# Patient Record
Sex: Female | Born: 1965
Health system: Southern US, Community
[De-identification: ages and names within clinical notes are randomized; demographics above are authoritative.]

## PROBLEM LIST (undated history)

## (undated) DIAGNOSIS — J387 Other diseases of larynx: Secondary | ICD-10-CM

## (undated) DIAGNOSIS — G894 Chronic pain syndrome: Secondary | ICD-10-CM

## (undated) DIAGNOSIS — F32A Depression, unspecified: Secondary | ICD-10-CM

## (undated) DIAGNOSIS — M357 Hypermobility syndrome: Secondary | ICD-10-CM

## (undated) DIAGNOSIS — M1990 Unspecified osteoarthritis, unspecified site: Secondary | ICD-10-CM

## (undated) DIAGNOSIS — D472 Monoclonal gammopathy: Secondary | ICD-10-CM

## (undated) DIAGNOSIS — G5600 Carpal tunnel syndrome, unspecified upper limb: Secondary | ICD-10-CM

## (undated) DIAGNOSIS — N6012 Diffuse cystic mastopathy of left breast: Secondary | ICD-10-CM

## (undated) DIAGNOSIS — J45909 Unspecified asthma, uncomplicated: Secondary | ICD-10-CM

## (undated) DIAGNOSIS — Q796 Ehlers-Danlos syndrome, unspecified: Secondary | ICD-10-CM

## (undated) DIAGNOSIS — T783XXA Angioneurotic edema, initial encounter: Secondary | ICD-10-CM

## (undated) DIAGNOSIS — M533 Sacrococcygeal disorders, not elsewhere classified: Secondary | ICD-10-CM

## (undated) DIAGNOSIS — E785 Hyperlipidemia, unspecified: Secondary | ICD-10-CM

## (undated) DIAGNOSIS — N6011 Diffuse cystic mastopathy of right breast: Secondary | ICD-10-CM

## (undated) DIAGNOSIS — L658 Other specified nonscarring hair loss: Secondary | ICD-10-CM

## (undated) DIAGNOSIS — C801 Malignant (primary) neoplasm, unspecified: Secondary | ICD-10-CM

## (undated) DIAGNOSIS — Z803 Family history of malignant neoplasm of breast: Secondary | ICD-10-CM

## (undated) DIAGNOSIS — C4490 Unspecified malignant neoplasm of skin, unspecified: Secondary | ICD-10-CM

## (undated) DIAGNOSIS — D839 Common variable immunodeficiency, unspecified: Secondary | ICD-10-CM

## (undated) DIAGNOSIS — R05 Cough: Secondary | ICD-10-CM

## (undated) DIAGNOSIS — M5416 Radiculopathy, lumbar region: Secondary | ICD-10-CM

## (undated) DIAGNOSIS — F419 Anxiety disorder, unspecified: Secondary | ICD-10-CM

## (undated) DIAGNOSIS — M810 Age-related osteoporosis without current pathological fracture: Secondary | ICD-10-CM

## (undated) DIAGNOSIS — L308 Other specified dermatitis: Secondary | ICD-10-CM

## (undated) DIAGNOSIS — M797 Fibromyalgia: Secondary | ICD-10-CM

## (undated) DIAGNOSIS — N906 Unspecified hypertrophy of vulva: Secondary | ICD-10-CM

## (undated) DIAGNOSIS — M180 Bilateral primary osteoarthritis of first carpometacarpal joints: Secondary | ICD-10-CM

## (undated) DIAGNOSIS — M8580 Other specified disorders of bone density and structure, unspecified site: Secondary | ICD-10-CM

## (undated) DIAGNOSIS — T7840XA Allergy, unspecified, initial encounter: Secondary | ICD-10-CM

## (undated) HISTORY — DX: Malignant (primary) neoplasm, unspecified: C80.1

## (undated) HISTORY — DX: Diffuse cystic mastopathy of right breast: N60.11

## (undated) HISTORY — DX: Depression, unspecified: F32.A

## (undated) HISTORY — DX: Sacrococcygeal disorders, not elsewhere classified: M53.3

## (undated) HISTORY — PX: DENTAL SURGERY: SHX609

## (undated) HISTORY — DX: Ehlers-Danlos syndrome, unspecified: Q79.60

## (undated) HISTORY — DX: Unspecified osteoarthritis, unspecified site: M19.90

## (undated) HISTORY — DX: Unspecified malignant neoplasm of skin, unspecified: C44.90

## (undated) HISTORY — DX: Allergy, unspecified, initial encounter: T78.40XA

## (undated) HISTORY — PX: SPINE SURGERY: SHX786

## (undated) HISTORY — DX: Family history of malignant neoplasm of breast: Z80.3

## (undated) HISTORY — DX: Cough: R05

## (undated) HISTORY — PX: JOINT REPLACEMENT: SHX530

## (undated) HISTORY — DX: Diffuse cystic mastopathy of left breast: N60.12

## (undated) HISTORY — DX: Hyperlipidemia, unspecified: E78.5

## (undated) HISTORY — DX: Angioneurotic edema, initial encounter: T78.3XXA

## (undated) HISTORY — DX: Anxiety disorder, unspecified: F41.9

## (undated) HISTORY — DX: Other specified disorders of bone density and structure, unspecified site: M85.80

## (undated) HISTORY — DX: Radiculopathy, lumbar region: M54.16

## (undated) HISTORY — PX: ELBOW FRACTURE SURGERY: SHX616

## (undated) HISTORY — DX: Other specified dermatitis: L30.8

## (undated) HISTORY — PX: EYE SURGERY: SHX253

## (undated) HISTORY — DX: Bilateral primary osteoarthritis of first carpometacarpal joints: M18.0

## (undated) HISTORY — DX: Monoclonal gammopathy: D47.2

## (undated) HISTORY — DX: Age-related osteoporosis without current pathological fracture: M81.0

## (undated) HISTORY — DX: Carpal tunnel syndrome, unspecified upper limb: G56.00

## (undated) HISTORY — DX: Other specified nonscarring hair loss: L65.8

## (undated) HISTORY — PX: BREAST SURGERY: SHX581

## (undated) HISTORY — PX: COLONOSCOPY: SHX174

## (undated) HISTORY — DX: Common variable immunodeficiency, unspecified: D83.9

## (undated) HISTORY — PX: FRACTURE SURGERY: SHX138

## (undated) HISTORY — DX: Other diseases of larynx: J38.7

## (undated) HISTORY — DX: Fibromyalgia: M79.7

## (undated) HISTORY — DX: Hypermobility syndrome: M35.7

## (undated) HISTORY — DX: Unspecified hypertrophy of vulva: N90.60

## (undated) HISTORY — DX: Chronic pain syndrome: G89.4

---

## 1998-04-11 DIAGNOSIS — K582 Mixed irritable bowel syndrome: Secondary | ICD-10-CM | POA: Insufficient documentation

## 2006-12-01 DIAGNOSIS — F4323 Adjustment disorder with mixed anxiety and depressed mood: Secondary | ICD-10-CM | POA: Insufficient documentation

## 2007-05-01 DIAGNOSIS — F4312 Post-traumatic stress disorder, chronic: Secondary | ICD-10-CM | POA: Insufficient documentation

## 2009-10-09 ENCOUNTER — Encounter: Admission: RE | Admit: 2009-10-09 | Discharge: 2009-10-09 | Payer: Self-pay | Admitting: Obstetrics and Gynecology

## 2011-04-14 DIAGNOSIS — L658 Other specified nonscarring hair loss: Secondary | ICD-10-CM | POA: Insufficient documentation

## 2011-04-14 DIAGNOSIS — L308 Other specified dermatitis: Secondary | ICD-10-CM | POA: Insufficient documentation

## 2011-04-14 DIAGNOSIS — L989 Disorder of the skin and subcutaneous tissue, unspecified: Secondary | ICD-10-CM

## 2011-04-14 HISTORY — DX: Disorder of the skin and subcutaneous tissue, unspecified: L98.9

## 2011-04-14 HISTORY — DX: Other specified nonscarring hair loss: L65.8

## 2014-04-17 ENCOUNTER — Emergency Department (HOSPITAL_COMMUNITY)
Admission: EM | Admit: 2014-04-17 | Discharge: 2014-04-17 | Disposition: A | Discharge status: Home / Self Care | Payer: 59 | Source: Home / Self Care | Attending: Emergency Medicine | Admitting: Emergency Medicine

## 2014-04-17 ENCOUNTER — Encounter (HOSPITAL_COMMUNITY): Payer: Self-pay | Admitting: *Deleted

## 2014-04-17 DIAGNOSIS — M545 Low back pain, unspecified: Secondary | ICD-10-CM

## 2014-04-17 HISTORY — DX: Unspecified asthma, uncomplicated: J45.909

## 2014-04-17 MED ORDER — KETOROLAC TROMETHAMINE 60 MG/2ML IM SOLN
INTRAMUSCULAR | Status: AC
Start: 2014-04-17 — End: 2014-04-17
  Filled 2014-04-17: qty 2

## 2014-04-17 MED ORDER — GABAPENTIN 300 MG PO CAPS: 300.0000 mg | ORAL_CAPSULE | Freq: Every day | ORAL | Status: DC

## 2014-04-17 MED ORDER — KETOROLAC TROMETHAMINE 60 MG/2ML IM SOLN
60.0000 mg | Freq: Once | INTRAMUSCULAR | Status: AC
  Administered 2014-04-17: 60 mg via INTRAMUSCULAR

## 2014-04-17 MED ORDER — PREDNISONE 20 MG PO TABS: 40.0000 mg | ORAL_TABLET | Freq: Every day | ORAL | Status: DC

## 2014-04-17 MED ORDER — CYCLOBENZAPRINE HCL 10 MG PO TABS: 10.0000 mg | ORAL_TABLET | Freq: Three times a day (TID) | ORAL | Status: DC | PRN

## 2014-04-17 NOTE — ED Notes (Signed)
Pt  Reports   Back  Pain        X  sev  Months        No  Recent  injury

## 2014-04-17 NOTE — Discharge Instructions (Signed)
Take prednisone 40mg  daily for 5 days. Take flexeril 3 times a day as needed for muscle spasm. Continue the ibuprofen - you can take it up to ever 6-8 hours. Continue gabapentin at bedtime as needed.  Once the pain is back to a manageable level, get back on your exercising schedule. Seeing a chiropractor (there is one [place] of here on [address]) or a massage therapist would likely be beneficial. Follow up as needed.

## 2014-04-17 NOTE — ED Provider Notes (Signed)
CSN: [id number]     Arrival date & time 04/17/14  1227 History   First MD Initiated Contact with Patient 04/17/14 1338     Chief Complaint  Patient presents with  . Back Pain   (Consider location/radiation/quality/duration/timing/severity/associated sxs/prior Treatment) HPI  She is a 49 year old woman here for evaluation of low back pain. She states this is a chronic issue for the last 20 years. Typically, it is manageable with ibuprofen in the morning, Tylenol and the evening, and an exercise and stretching regimen. Her pain has gotten worse in the last 2 months. She states she has changed jobs and gone back to school which have interrupted her normal exercise and stretching routine. The pain is located across her lower back, worse on the right side. She does occasionally have some pain radiating down into her legs. She denies any numbness, tingling, weakness. No bowel or bladder incontinence. No saddle anesthesia. She does have gabapentin available, and the pain has gone to the point where she has been using it the last few days. She would like to avoid all narcotics.  She has a history of asthma for which she uses an albuterol inhaler as needed.  Past Medical History  Diagnosis Date  . Asthma    History reviewed. No pertinent past surgical history. History reviewed. No pertinent family history. History  Substance Use Topics  . Smoking status: Never Smoker   . Smokeless tobacco: Not on file  . Alcohol Use: No   OB History    No data available     Review of Systems  Musculoskeletal: Positive for back pain.  Neurological: Negative for weakness and numbness.    Allergies  Penicillins and Sulfa antibiotics  Home Medications   Prior to Admission medications   Medication Sig Start Date End Date Taking? Authorizing Provider  Acetaminophen (TYLENOL ARTHRITIS PAIN PO) Take by mouth.   Yes Historical Provider, MD  Albuterol Sulfate 108 (90 BASE) MCG/ACT AEPB Inhale into the lungs.    Yes Historical Provider, MD  fluticasone (FLONASE) 50 MCG/ACT nasal spray Place 2 sprays into both nostrils daily.   Yes Historical Provider, MD  gabapentin (NEURONTIN) 300 MG capsule Take 300 mg by mouth 3 (three) times daily.   Yes Historical Provider, MD  hyoscyamine (LEVBID) 0.375 MG 12 hr tablet Take 0.375 mg by mouth 2 (two) times daily.   Yes Historical Provider, MD  ibuprofen (ADVIL,MOTRIN) 800 MG tablet Take 800 mg by mouth every 8 (eight) hours as needed.   Yes Historical Provider, MD  montelukast (SINGULAIR) 10 MG tablet Take 10 mg by mouth at bedtime.   Yes Historical Provider, MD  cyclobenzaprine (FLEXERIL) 10 MG tablet Take 1 tablet (10 mg total) by mouth 3 (three) times daily as needed for muscle spasms. 04/17/14   Melony Overly, MD  gabapentin (NEURONTIN) 300 MG capsule Take 1 capsule (300 mg total) by mouth at bedtime. 04/17/14   Melony Overly, MD  predniSONE (DELTASONE) 20 MG tablet Take 2 tablets (40 mg total) by mouth daily. 04/17/14   Melony Overly, MD   BP 122/84 mmHg  Pulse 60  Temp(Src) 98.3 F (36.8 C) (Oral)  Resp 14  SpO2 100% Physical Exam  Constitutional: She is oriented to person, place, and time. She appears well-developed and well-nourished. She appears distressed (uncomfortable).  Cardiovascular: Normal rate.   Pulmonary/Chest: Effort normal.  Musculoskeletal:  Back: No vertebral step-offs.  No erythema or edema.  Diffusely tender across lower back.  Muscle spasm R >  L.  Negative SLR.  5/5 strength in bilateral lower extremities.  Neurological: She is alert and oriented to person, place, and time.    ED Course  Procedures (including critical care time) Labs Review Labs Reviewed - No data to display  Imaging Review No results found.   MDM   1. Bilateral low back pain without sciatica    Toradol 60 mg IM given. We'll treat with a five-day course of prednisone and add Flexeril 3 times a day when necessary. I refilled her gabapentin. Recommended resuming  her exercise and stretching regimen as soon as possible. Also discussed that chiropractor or massage would likely be beneficial. Follow-up as needed.    Melony Overly, MD 04/17/14 [phone]

## 2014-04-19 ENCOUNTER — Emergency Department (HOSPITAL_COMMUNITY)
Admission: EM | Admit: 2014-04-19 | Discharge: 2014-04-19 | Disposition: A | Discharge status: Home / Self Care | Payer: 59 | Source: Home / Self Care | Attending: Emergency Medicine | Admitting: Emergency Medicine

## 2014-04-19 ENCOUNTER — Encounter (HOSPITAL_COMMUNITY): Payer: Self-pay | Admitting: Emergency Medicine

## 2014-04-19 DIAGNOSIS — M5431 Sciatica, right side: Secondary | ICD-10-CM

## 2014-04-19 MED ORDER — TRAMADOL HCL 50 MG PO TABS: 100.0000 mg | ORAL_TABLET | Freq: Three times a day (TID) | ORAL | Status: DC | PRN

## 2014-04-19 MED ORDER — KETOROLAC TROMETHAMINE 60 MG/2ML IM SOLN
INTRAMUSCULAR | Status: AC
  Filled 2014-04-19: qty 2

## 2014-04-19 MED ORDER — KETOROLAC TROMETHAMINE 60 MG/2ML IM SOLN
60.0000 mg | Freq: Once | INTRAMUSCULAR | Status: AC
  Administered 2014-04-19: 60 mg via INTRAMUSCULAR

## 2014-04-19 MED ORDER — TIZANIDINE HCL 4 MG PO CAPS: 4.0000 mg | ORAL_CAPSULE | Freq: Three times a day (TID) | ORAL | Status: DC

## 2014-04-19 NOTE — Discharge Instructions (Signed)
Do exercises twice daily followed by moist heat for 15 minutes. ° ° ° ° ° °Try to be as active as possible. ° °If no better in 2 weeks, follow up with orthopedist. ° ° °
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
## 2014-04-22 ENCOUNTER — Emergency Department (HOSPITAL_COMMUNITY)
Admission: EM | Admit: 2014-04-22 | Discharge: 2014-04-22 | Disposition: A | Discharge status: Home / Self Care | Payer: 59 | Source: Home / Self Care

## 2014-05-06 ENCOUNTER — Other Ambulatory Visit (HOSPITAL_COMMUNITY): Payer: Self-pay | Admitting: Neurosurgery

## 2014-05-06 DIAGNOSIS — M545 Low back pain: Secondary | ICD-10-CM

## 2014-05-08 ENCOUNTER — Ambulatory Visit (INDEPENDENT_AMBULATORY_CARE_PROVIDER_SITE_OTHER): Payer: 59 | Admitting: Family Medicine

## 2014-05-08 VITALS — BP 110/78 | HR 65 | Temp 97.9°F | Resp 16 | Ht 66.0 in | Wt 135.0 lb

## 2014-05-08 DIAGNOSIS — R059 Cough, unspecified: Secondary | ICD-10-CM

## 2014-05-08 DIAGNOSIS — R05 Cough: Secondary | ICD-10-CM

## 2014-05-08 DIAGNOSIS — J069 Acute upper respiratory infection, unspecified: Secondary | ICD-10-CM

## 2014-05-08 MED ORDER — AZITHROMYCIN 250 MG PO TABS: ORAL_TABLET | ORAL | Status: DC

## 2014-05-08 NOTE — Patient Instructions (Signed)
You likely have a viral URI. Continue to use OTC medications as needed.   However if you are not getting better in the next few days you can fill and use the azithromycin rx.   Let me know if you have any questions or concerns!

## 2014-05-08 NOTE — Progress Notes (Signed)
[facility] [address] [place] [address] [phone]  Date:  05/08/2014   Name:  Lindsey [name]   DOB:  Oct 29, 1965   MRN:  [id number]  PCP:  No primary care provider on file.    Chief Complaint: Cough and Chest Congestion   History of Present Illness:  Lindsey [name] is a 49 y.o. very pleasant female patient who presents with the following:  Here today as a new patient with illness.  She has noted "cold sx" such as cough, congestion, runny and stuffy nose, a little bit of sinus pressure.  These sx have been present for about one week.  She is using some delsym OTC.  She wants to make sure she does not have bacterial bronchitis but does not want to have abx unless necessary  She has not noted a fever, but she has had chills.  She has a history of asthma, but has not really noted this worsening since she has been ill.  She does have a history of back trouble, was in the ER aabout 2 weeks ago and treated iwht zanaflex, tramadol, prednisone.   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   Past Medical History  Diagnosis Date  . Asthma     History reviewed. No pertinent past surgical history.  History  Substance Use Topics  . Smoking status: Never Smoker   . Smokeless tobacco: Not on file  . Alcohol Use: No    History reviewed. No pertinent family history.  Allergies  Allergen Reactions  . Penicillins   . Sulfa Antibiotics     Medication list has been reviewed and updated.  Current Outpatient Prescriptions on File Prior to Visit  Medication Sig Dispense Refill  . Acetaminophen (TYLENOL ARTHRITIS PAIN PO) Take by mouth.    . Albuterol Sulfate 108 (90 BASE) MCG/ACT AEPB Inhale into the lungs.    . fluticasone (FLONASE) 50 MCG/ACT nasal spray Place 2 sprays into both nostrils daily.    Marland Kitchen gabapentin (NEURONTIN) 300 MG capsule Take 1 capsule  (300 mg total) by mouth at bedtime. 30 capsule 3  . hyoscyamine (LEVBID) 0.375 MG 12 hr tablet Take 0.375 mg by mouth 2 (two) times daily.    Marland Kitchen ibuprofen (ADVIL,MOTRIN) 800 MG tablet Take 800 mg by mouth every 8 (eight) hours as needed.    . montelukast (SINGULAIR) 10 MG tablet Take 10 mg by mouth at bedtime.    . cyclobenzaprine (FLEXERIL) 10 MG tablet Take 1 tablet (10 mg total) by mouth 3 (three) times daily as needed for muscle spasms. (Patient not taking: Reported on 05/08/2014) 30 tablet 0  . gabapentin (NEURONTIN) 300 MG capsule Take 300 mg by mouth 3 (three) times daily.    . predniSONE (DELTASONE) 20 MG tablet Take 2 tablets (40 mg total) by mouth daily. (Patient not taking: Reported on 05/08/2014) 10 tablet 0  . tiZANidine (ZANAFLEX) 4 MG capsule Take 1 capsule (4 mg total) by mouth 3 (three) times daily. (Patient not taking: Reported on 05/08/2014) 30 capsule 0  . traMADol (ULTRAM) 50 MG tablet Take 2 tablets (100 mg total) by mouth every 8 (eight) hours as needed. (Patient not taking: Reported on 05/08/2014) 30 tablet 0   No current facility-administered medications on file prior to visit.    Review of Systems:  As per HPI-  otherwise negative.   Physical Examination: Filed Vitals:   05/08/14 1713  BP: 110/78  Pulse: 98  Temp: 97.9 F (36.6 C)  Resp: 16   Filed Vitals:   05/08/14 1713  Height: 5\' 6"  (1.676 m)  Weight: 135 lb (61.236 kg)   Body mass index is 21.8 kg/(m^2). Ideal Body Weight: Weight in (lb) to have BMI = 25: 154.6  GEN: WDWN, NAD, Non-toxic, A & O x 3, looks well HEENT: Atraumatic, Normocephalic. Neck supple. No masses, No LAD.  Bilateral TM wnl, oropharynx normal.  PEERL,EOMI.   Ears and Nose: No external deformity. CV: RRR, No M/G/R. No JVD. No thrill. No extra heart sounds. PULM: CTA B, no wheezes, crackles, rhonchi. No retractions. No resp. distress. No accessory muscle use. EXTR: No c/c/e NEURO Normal gait.  PSYCH: Normally interactive. Conversant.  Not depressed or anxious appearing.  Calm demeanor.    Assessment and Plan: Viral URI - Plan: azithromycin (ZITHROMAX) 250 MG tablet  Cough - Plan: azithromycin (ZITHROMAX) 250 MG tablet  Benign exam.  Reassured that she likely has a viral URI.  She is ok with this, but will take a zpack to hang on to and use if not better in the next few days.  She will let me know if she is worse or if she has any other concerns   Signed Lamar Blinks, MD

## 2014-05-12 ENCOUNTER — Encounter (HOSPITAL_COMMUNITY): Payer: Self-pay | Admitting: *Deleted

## 2014-05-12 ENCOUNTER — Emergency Department (HOSPITAL_COMMUNITY)
Admission: EM | Admit: 2014-05-12 | Discharge: 2014-05-12 | Disposition: A | Discharge status: Home / Self Care | Payer: 59 | Source: Home / Self Care | Attending: Family Medicine | Admitting: Family Medicine

## 2014-05-12 DIAGNOSIS — M5136 Other intervertebral disc degeneration, lumbar region: Secondary | ICD-10-CM

## 2014-05-12 NOTE — ED Notes (Signed)
Per  Dr Juventino Slovak    Change  Work  Note  To  Return  2/2 /16   On light  Duty  Till  Released  By  Dr Arnoldo Morale

## 2014-05-12 NOTE — ED Notes (Signed)
Dr  Juventino Slovak    Wants  Pt  To  Have  A  Note  Releasing her to  Light  Duty 05/13/14

## 2014-05-12 NOTE — Discharge Instructions (Signed)
See dr Arnoldo Morale and get mri as planned.

## 2014-05-12 NOTE — ED Notes (Signed)
Dr  Juventino Slovak  Back  In  To  See  Patient      And  Discussed   Plan of  Care  With  Pt

## 2014-05-12 NOTE — ED Notes (Signed)
Pt  Wants  To  Be  evaulted       To  Go  Back  To  Work          Pt  Still  Reports   Still   Has  Back  Pain         -     Pt   Has  An  MRI    SCHEDULED   FEB  9          SHE  IS  SITTING  UPRIGHT  ON  EXAM TABL;E   IN NO  SEVERE    DISTRESS

## 2014-05-12 NOTE — ED Provider Notes (Signed)
CSN: [id number]     Arrival date & time 05/12/14  1430 History   First MD Initiated Contact with Patient 05/12/14 [phone]     Chief Complaint  Patient presents with  . Follow-up   (Consider location/radiation/quality/duration/timing/severity/associated sxs/prior Treatment) Patient is a 49 y.o. female presenting with back pain. The history is provided by the patient.  Back Pain Location:  Lumbar spine Quality:  Shooting Radiates to:  R foot and R thigh Pain severity:  Mild Onset quality:  Gradual Duration:  6 months Progression:  Waxing and waning Chronicity:  Chronic Ineffective treatments:  Muscle relaxants and NSAIDs (chiropracter care.) Associated symptoms: leg pain and numbness   Associated symptoms: no bladder incontinence and no bowel incontinence   Risk factors comment:  Chronic problem, not responding to current therapy, wants to return to work and f/u with dr Arnoldo Morale as planned.   Past Medical History  Diagnosis Date  . Asthma    History reviewed. No pertinent past surgical history. History reviewed. No pertinent family history. History  Substance Use Topics  . Smoking status: Never Smoker   . Smokeless tobacco: Not on file  . Alcohol Use: No   OB History    No data available     Review of Systems  Gastrointestinal: Negative.  Negative for bowel incontinence.  Genitourinary: Negative.  Negative for bladder incontinence.  Musculoskeletal: Positive for back pain.  Skin: Negative.   Neurological: Positive for numbness.    Allergies  Penicillins and Sulfa antibiotics  Home Medications   Prior to Admission medications   Medication Sig Start Date End Date Taking? Authorizing Provider  Acetaminophen (TYLENOL ARTHRITIS PAIN PO) Take by mouth.    Historical Provider, MD  Albuterol Sulfate 108 (90 BASE) MCG/ACT AEPB Inhale into the lungs.    Historical Provider, MD  azithromycin (ZITHROMAX) 250 MG tablet Use as a zpack 05/08/14   Gay Filler Copland, MD   cyclobenzaprine (FLEXERIL) 10 MG tablet Take 1 tablet (10 mg total) by mouth 3 (three) times daily as needed for muscle spasms. Patient not taking: Reported on 05/08/2014 04/17/14   Melony Overly, MD  fluticasone [facility]) 50 MCG/ACT nasal spray Place 2 sprays into both nostrils daily.    Historical Provider, MD  gabapentin (NEURONTIN) 300 MG capsule Take 1 capsule (300 mg total) by mouth at bedtime. 04/17/14   Melony Overly, MD  gabapentin (NEURONTIN) 300 MG capsule Take 300 mg by mouth 3 (three) times daily.    Historical Provider, MD  hyoscyamine (LEVBID) 0.375 MG 12 hr tablet Take 0.375 mg by mouth 2 (two) times daily.    Historical Provider, MD  ibuprofen (ADVIL,MOTRIN) 800 MG tablet Take 800 mg by mouth every 8 (eight) hours as needed.    Historical Provider, MD  montelukast (SINGULAIR) 10 MG tablet Take 10 mg by mouth at bedtime.    Historical Provider, MD  traMADol (ULTRAM) 50 MG tablet Take 2 tablets (100 mg total) by mouth every 8 (eight) hours as needed. Patient not taking: Reported on 05/08/2014 04/19/14   Harden Mo, MD   BP 115/79 mmHg  Pulse 80  Temp(Src) 98 F (36.7 C) (Oral)  Resp 16  SpO2 100%  LMP 04/11/2014 Physical Exam  Constitutional: She is oriented to person, place, and time. She appears well-developed and well-nourished. No distress.  Neck: Normal range of motion. Neck supple.  Musculoskeletal: She exhibits tenderness.  Neurological: She is alert and oriented to person, place, and time.  Skin: Skin is warm and  dry.  Nursing note and vitals reviewed.   ED Course  Procedures (including critical care time) Labs Review Labs Reviewed - No data to display  Imaging Review No results found.   MDM   1. Degenerative lumbar disc        Billy Fischer, MD 05/12/14 1626

## 2014-05-16 ENCOUNTER — Ambulatory Visit (INDEPENDENT_AMBULATORY_CARE_PROVIDER_SITE_OTHER): Payer: 59 | Admitting: Physician Assistant

## 2014-05-16 VITALS — BP 110/70 | HR 60 | Temp 97.4°F | Resp 16 | Ht 66.0 in | Wt 139.0 lb

## 2014-05-16 DIAGNOSIS — M545 Low back pain: Secondary | ICD-10-CM

## 2014-05-16 NOTE — Progress Notes (Signed)
[facility] [address] [place] [address] [phone]  Date:  05/16/2014   Name:  Lindsey [name]   DOB:  [birth year]   MRN:  [id number]  PCP:  No primary care provider on file.    Chief Complaint: Back Pain   History of Present Illness:  Lindsey [name] is a 49 y.o. very pleasant female patient who presents with the following:  She has back pain that has been present for about 20 years, but has progressively worsened over the last month.  She states that the back pain progressed to muscle spasms.  She was referred to neurosurgeon, Dr. Arnoldo Morale.  She has an MRI scheduled for 05/20/2014.  In the meantime, she has had therapy with Dr. Jimmye Norman of [place].  She states that this worsened her pain.  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  XR displayed DDD.  He referred her to neurosurgery and he advised that she not work.  She would like to resume her work however, but with restrictons.  She is able to do exercises and stretches at home.  The numbness and tingling of her lower extremities has resolved, however she is unable to stay placed in one postural position for longer than 10 minutes.  She was able do a 2 mile run without complication.  She denies fever, incontinence, numbness and tingling, or weakness.    Past Medical History  Diagnosis Date  . Asthma     History reviewed. No pertinent past surgical history.  History  Substance Use Topics  . Smoking status: Never Smoker   . Smokeless tobacco: Not on file  . Alcohol Use: No    Family History  Problem Relation Age of Onset  . Hypertension Mother   . Stroke Father   . Hypertension Father     Allergies  Allergen Reactions  . Penicillins   . Sulfa Antibiotics     Medication list has been reviewed and updated.  Current Outpatient Prescriptions on File Prior to Visit  Medication Sig Dispense Refill  . Acetaminophen (TYLENOL ARTHRITIS PAIN PO) Take by mouth.    .  Albuterol Sulfate 108 (90 BASE) MCG/ACT AEPB Inhale into the lungs.    . fluticasone (FLONASE) 50 MCG/ACT nasal spray Place 2 sprays into both nostrils daily.    Marland Kitchen gabapentin (NEURONTIN) 300 MG capsule Take 300 mg by mouth 3 (three) times daily.    . hyoscyamine (LEVBID) 0.375 MG 12 hr tablet Take 0.375 mg by mouth 2 (two) times daily.    Marland Kitchen ibuprofen (ADVIL,MOTRIN) 800 MG tablet Take 800 mg by mouth every 8 (eight) hours as needed.    . montelukast (SINGULAIR) 10 MG tablet Take 10 mg by mouth at bedtime.    . cyclobenzaprine (FLEXERIL) 10 MG tablet Take 1 tablet (10 mg total) by mouth 3 (three) times daily as needed for muscle spasms. (Patient not taking: Reported on 05/08/2014) 30 tablet 0  . gabapentin (NEURONTIN) 300 MG capsule Take 1 capsule (300 mg total) by mouth at bedtime. (Patient not taking: Reported on 05/16/2014) 30 capsule 3  . traMADol (ULTRAM) 50 MG tablet Take 2 tablets (100 mg total) by mouth every 8 (eight) hours as needed. (Patient not taking: Reported on 05/08/2014) 30 tablet 0   No current facility-administered medications on file prior to visit.    Review of Systems: ROS otherwise unremarkable unless listed above.    Physical Examination: Filed Vitals:   05/16/14 1136  BP: 110/70  Pulse: 60  Temp: 97.4 F (36.3 C)  Resp: 16   Filed Vitals:   05/16/14 1136  Height: 5\' 6"  (1.676 m)  Weight: 139 lb (63.05 kg)   Body mass index is 22.45 kg/(m^2). Ideal Body Weight: Weight in (lb) to have BMI = 25: 154.6  Constitutional: She is oriented to person, place, and time. She appears well-developed and well-nourished. No distress.  HENT:  Head: Normocephalic.  Cardiovascular: Normal rate.   Musculoskeletal: Normal range of motion.  Back: No bruising, erythema, or swelling.  No muscular spasm appreciated though tender at the right lower lumbar with palpation.  Normal forward flexion, however with some pain.  Normal lateral deviation.  Normal gait, normal tip toe gait, normal  heel walk.  Normal tandem movement.    Neurological: She is alert and oriented to person, place, and time. She has normal strength. She displays no atrophy. No cranial nerve deficit. She exhibits normal muscle tone. She displays a negative Romberg sign. Coordination and gait normal.  Reflex Scores:      Patellar reflexes are 2+ on the right side and 2+ on the left side. Skin: Skin is warm and dry.  Psychiatric: She has a normal mood and affect. Her behavior is normal.   Assessment and Plan: 49 year old female is here today for chief complaint of back pain.  Patient appears very able bodied, however after integrative therapy, patient is not improving per report.  I will place her on temporary work restrictions.  Patient is requesting every other day schedule, which I will not do at this time.    Low back pain, unspecified back pain laterality, with sciatica presence unspecified -stable, no changes to medicaiton -placed temporary work restriction in 2 weeks-- Restrictions include not lifting greater than 10 lbs, no pulling greater than 30 and 50 lbs, respectively.  4 hour work shifts per day, and 10 minute position changes.   -Because XRay was done with chiropractor, and MRI in several days, will not reimage at this time, but await results.   Ivar Drape, PA-C [facility] 2/6/20168:02 PM

## 2014-05-20 ENCOUNTER — Ambulatory Visit (HOSPITAL_COMMUNITY)
Admission: RE | Admit: 2014-05-20 | Discharge: 2014-05-20 | Disposition: A | Discharge status: Home / Self Care | Payer: 59 | Source: Ambulatory Visit | Attending: Neurosurgery | Admitting: Neurosurgery

## 2014-05-20 DIAGNOSIS — K769 Liver disease, unspecified: Secondary | ICD-10-CM | POA: Insufficient documentation

## 2014-05-20 DIAGNOSIS — M545 Low back pain: Secondary | ICD-10-CM

## 2014-05-20 DIAGNOSIS — M47896 Other spondylosis, lumbar region: Secondary | ICD-10-CM | POA: Diagnosis not present

## 2014-05-23 ENCOUNTER — Ambulatory Visit (INDEPENDENT_AMBULATORY_CARE_PROVIDER_SITE_OTHER): Payer: 59 | Admitting: Internal Medicine

## 2014-05-23 VITALS — BP 144/92 | HR 82 | Temp 97.8°F | Resp 16 | Ht 66.0 in | Wt 136.0 lb

## 2014-05-23 DIAGNOSIS — M545 Low back pain, unspecified: Secondary | ICD-10-CM

## 2014-05-23 NOTE — Patient Instructions (Signed)
You may return to work as directed.  follow up with Dr Arnoldo Morale for re evaluation of your back pain and to followup with the MRI of the liver Any problems return to the office.

## 2014-05-23 NOTE — Progress Notes (Signed)
   Subjective:    Patient ID: Lindsey [name], female    DOB: 13-Jan-1966, 49 y.o.   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 Arnoldo Morale with an mri done on 2/9.   She has a follow up with te neurosurgeon and also has a mri o f the liver which is to be schedule d by the neurosurgeon because of 2 lesion that were noted  on the mri of the lumbar spine and needs to be evaluated further.   The mri result showed minimal lumbar spondylosis and facet arthrosis without stenosis. She continues t o have some chronic pain of the right para lumbar region but does not want to take any pain meds or muscle relaxants and will follow up for this with Dr. Arnoldo Morale.    Review of Systems  Constitutional: Negative.   HENT: Negative.   Eyes: Negative.   Respiratory: Negative.   Cardiovascular: Negative.   Gastrointestinal: Negative.   Endocrine: Negative.   Genitourinary: Negative.   Musculoskeletal: Positive for back pain.  Skin: Negative.   Allergic/Immunologic: Negative.   Neurological: Negative.   Hematological: Negative.   Psychiatric/Behavioral: Negative.   All other systems reviewed and are negative.      Objective:   Physical Exam  Constitutional: She is oriented to person, place, and time. She appears well-developed and well-nourished.  Blood pressure 144/92, pulse 82, temperature 97.8 F (36.6 C), temperature source Oral, resp. rate 16, height 5\' 6"  (1.676 m), weight 136 lb (61.689 kg), last menstrual period 04/11/2014, SpO2 100 %.  HENT:  Head: Normocephalic and atraumatic.  Mouth/Throat: Oropharynx is clear and moist.    Eyes: Conjunctivae and EOM are normal. Pupils are equal, round, and reactive to light.  Neck: Normal range of motion. Neck supple.  Cardiovascular: Normal rate, regular rhythm, normal heart sounds and intact distal pulses.   Pulmonary/Chest: Effort normal and breath sounds normal.  Abdominal: Soft.  Musculoskeletal: She exhibits tenderness.  Tender para lumbar spasm of the lower back on the right side  Neurological: She is alert and oriented to person, place, and time. She has normal reflexes.  Skin: Skin is warm and dry.  Psychiatric: She has a normal mood and affect. Her behavior is normal. Judgment and thought content normal.  Nursing note and vitals reviewed.         Assessment & Plan:

## 2014-05-29 ENCOUNTER — Other Ambulatory Visit (HOSPITAL_COMMUNITY): Payer: Self-pay | Admitting: Neurosurgery

## 2014-05-29 DIAGNOSIS — K769 Liver disease, unspecified: Secondary | ICD-10-CM

## 2014-06-03 ENCOUNTER — Ambulatory Visit (INDEPENDENT_AMBULATORY_CARE_PROVIDER_SITE_OTHER): Payer: 59 | Admitting: Physician Assistant

## 2014-06-03 VITALS — BP 130/72 | HR 75 | Temp 97.4°F | Resp 16 | Ht 66.0 in | Wt 134.4 lb

## 2014-06-03 DIAGNOSIS — M5136 Other intervertebral disc degeneration, lumbar region: Secondary | ICD-10-CM

## 2014-06-03 DIAGNOSIS — M545 Low back pain, unspecified: Secondary | ICD-10-CM

## 2014-06-03 DIAGNOSIS — M5126 Other intervertebral disc displacement, lumbar region: Secondary | ICD-10-CM

## 2014-06-03 NOTE — Progress Notes (Signed)
Subjective:    Patient ID: Lindsey [name], female    DOB: 1966-03-22, 49 y.o.   MRN: [id number]  HPI  Pt presents to clinic to have a letter written for her work.  She has many letters but none of them are exactly what needs to be said for the company, [employer], that deals with missed work or restrictions at her job.  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  Her manager has been great.  Her normal shifts are 8 hours and consist of walking, sitting and standing.  She has been seen here 2 times as well as multiple times at [facility].  Her PCP will not write her a note and neither will Dr Arnoldo Morale who has ordered and evaluated her MRI and ordered PT for her.  She has no f/u plans with Dr Arnoldo Morale because per the patient he does not want to see her anymore because she is not a surgical case.  She has multiple notes that have been written that are almost correct but the [place] has told her what is wrong with all of them so she thinks tonight we can write her a letter they will accept for her restricted duties while doing her normal shift.    Tonight her pain has not changed from her last visit.  She is taking the same medication (zanaflex and gabapentin) as the last visit.  The only thing that has changed is that she now has an appt with PT next week.  She has tried to stay active (even riding a horse today) but she states that her normal duties at work cause her increase pain.  Chiropractor - 1/12 until 2/1 - they did laser and [place] - she was having more pain  MRI 2/9 - bulging disc but no herniation - Dr Arnoldo Morale - said PT Referral for PT ([facility]) 2/29 evaluation  Review of Systems     Objective:   Physical Exam  Constitutional: She is oriented to person, place, and time. She appears well-developed and well-nourished.  BP 130/72 mmHg  Pulse 75  Temp(Src) 97.4 F (36.3 C)  (Oral)  Resp 16  Ht 5\' 6"  (1.676 m)  Wt 134 lb 6 oz (60.952 kg)  BMI 21.70 kg/m2  SpO2 98%  LMP 04/11/2014   HENT:  Head: Normocephalic and atraumatic.  Right Ear: External ear normal.  Left Ear: External ear normal.  Pulmonary/Chest: Effort normal.  Musculoskeletal:  No antalgic gait.  Patient sitting cross-legged on table.  Easily moves from position to position.   Neurological: She is alert and oriented to person, place, and time.  Skin: Skin is warm and dry.  Psychiatric: She has a normal mood and affect. Her behavior is normal. Judgment and thought content normal.      Assessment & Plan:  Midline low back pain without sciatica - Plan: Ambulatory referral to Orthopedic Surgery  Bulging lumbar disc - Plan: Ambulatory referral to Orthopedic Surgery   Due to length of time of her pain and patient's request we are going to do a referral to ortho so she will be set up for further treatment if PT does not help her pain and for further evaluation.  I have written a letter tonight following her instructions for what to include from her understanding of the problems with the past letters per  her conversations with [employer].  Windell Hummingbird PA-C  [facility] 06/03/2014 7:58 PM

## 2014-06-09 ENCOUNTER — Ambulatory Visit: Payer: 59 | Attending: Neurosurgery

## 2014-06-09 ENCOUNTER — Ambulatory Visit (INDEPENDENT_AMBULATORY_CARE_PROVIDER_SITE_OTHER): Payer: 59 | Admitting: Physician Assistant

## 2014-06-09 VITALS — BP 118/62 | HR 84 | Temp 97.7°F | Resp 16 | Ht 66.0 in | Wt 134.4 lb

## 2014-06-09 DIAGNOSIS — R200 Anesthesia of skin: Secondary | ICD-10-CM | POA: Insufficient documentation

## 2014-06-09 DIAGNOSIS — M545 Low back pain: Secondary | ICD-10-CM | POA: Diagnosis not present

## 2014-06-09 DIAGNOSIS — M62838 Other muscle spasm: Secondary | ICD-10-CM | POA: Insufficient documentation

## 2014-06-09 DIAGNOSIS — R293 Abnormal posture: Secondary | ICD-10-CM | POA: Diagnosis not present

## 2014-06-09 MED ORDER — DICLOFENAC SODIUM 1 % TD GEL: 4.0000 g | Freq: Four times a day (QID) | TRANSDERMAL | Status: AC

## 2014-06-09 NOTE — Progress Notes (Addendum)
[facility] [address], [place] [address] [phone]  Date:  06/09/2014   Name:  Lindsey [name]   DOB:  02-03-1966   MRN:  [id number]  PCP:  Haywood Pao, MD    Chief Complaint: medication change and letter work   History of Present Illness:  Lindsey [name] is a 49 y.o. very pleasant female patient who presents with the following:  Patient was seen in our clinic for back pain 3.5 weeks ago for 2 months.  Patient states that she continues to have lower back pain, and believes that they are back spasms.  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  She has been seen within these 2.5 months by a chiropractor, PCP, neurosurgery, and has referral for ortho.  MRI present abnormality of spondylosis though they are not impeding on spinal cord, or nerves.  Patient has requested a work restriction, but states that there has been complications with getting paperwork cleared.  Patient has been able to exercise and stretch, and finds relief with the chair massages at her gym.  She states that the zanaflex does not work, and last week she took three within and two hours in attempt to relieve the pain, which did not help.  There are no active problems to display for this patient.   Past Medical History  Diagnosis Date  . Asthma     History reviewed. No pertinent past surgical history.  History  Substance Use Topics  . Smoking status: Never Smoker   . Smokeless tobacco: Not on file  . Alcohol Use: No    Family History  Problem Relation Age of Onset  . Hypertension Mother   . Stroke Father   . Hypertension Father     Allergies  Allergen Reactions  . Penicillins   . Sulfa Antibiotics     Medication list has been reviewed and updated.  Current Outpatient Prescriptions on File Prior to Visit  Medication Sig Dispense Refill  . Acetaminophen (TYLENOL ARTHRITIS PAIN PO) Take 2 capsules  by mouth daily.     . Albuterol Sulfate 108 (90 BASE) MCG/ACT AEPB Inhale into the lungs.    . fluticasone (FLONASE) 50 MCG/ACT nasal spray Place 2 sprays into both nostrils daily.    Marland Kitchen gabapentin (NEURONTIN) 300 MG capsule Take 1 capsule (300 mg total) by mouth at bedtime. 30 capsule 3  . hyoscyamine (LEVBID) 0.375 MG 12 hr tablet Take 0.375 mg by mouth 2 (two) times daily.    Marland Kitchen ibuprofen (ADVIL,MOTRIN) 800 MG tablet Take 800 mg by mouth every 8 (eight) hours as needed.    . montelukast (SINGULAIR) 10 MG tablet Take 10 mg by mouth at bedtime.    Marland Kitchen tiZANidine (ZANAFLEX) 4 MG capsule Take 4 mg by mouth 3 (three) times daily as needed for muscle spasms.     No current facility-administered medications on file prior to visit.    Review of Systems: ROS otherwise unremarkable unless otherwise mentioned.   Physical Examination: Filed Vitals:   06/09/14 1413  BP: 118/62  Pulse: 84  Temp: 97.7 F (36.5 C)  Resp: 16   Filed Vitals:   06/09/14 1413  Height: 5\' 6"  (1.676 m)  Weight: 134 lb 6.4 oz (60.963 kg)   Body mass index is 21.7 kg/(m^2). Ideal Body Weight: Weight in (lb) to have BMI = 25: 154.6  Physical Exam  Constitutional:  She is oriented to person, place, and time. She appears well-developed and well-nourished. No distress.  Eyes: EOM are normal. Pupils are equal, round, and reactive to light.  Cardiovascular: Normal rate.   Pulmonary/Chest: Effort normal. No respiratory distress.  Musculoskeletal:  No spinous tenderness with palpation.  She has decreased forward flexion.  Normal gait.  Pain with palpation, along right lower lumbar.    Neurological: She is alert and oriented to person, place, and time. She exhibits normal muscle tone. Coordination and gait normal.  Skin: Skin is warm and dry.  Psychiatric: She has a normal mood and affect. Her behavior is normal.    Assessment and Plan: 49 year old female is here today for chief complaint of back pain and for paper work  signed.  Contacted absence coordinator, who states that patient's current symptoms presented to the facility are justified absences.  She states that she will contact claims associate and patient to verify that the restriction letters are accepted.  The letter by Windell Hummingbird will remain her work restriction letter, until 06/17/2014, or when the ortho consult where back pain and restriction will be evaluated and given by the ortho specialist.    Low back pain, unspecified back pain laterality, with sciatica presence unspecified - Plan: diclofenac sodium (VOLTAREN) 1 % GEL  Ivar Drape, PA-C [facility] 2/29/20169:31 PM

## 2014-06-09 NOTE — Patient Instructions (Addendum)
Please attend your orthopedist appointment.  Apply the gel no more than 4 times per day and for only 7 days.

## 2014-06-09 NOTE — Therapy (Addendum)
[place], [place], [address] Phone: [phone]   Fax:  [phone]  Physical Therapy Evaluation  Patient Details  Name: Lindsey [name] MRN: [id number] Date of Birth: 08-18-1965 Referring Provider:  Newman Pies, MD  Encounter Date: 06/09/2014  Visit number: 1 # of visits:      12 Date for PT Re-eval:         07/21/2014    Past Medical History  Diagnosis Date  . Asthma     No past surgical history on file.  There were no vitals taken for this visit.  Visit Diagnosis:  Abnormal posture - Plan: PT plan of care cert/re-cert  Muscle spasm - Plan: PT plan of care cert/re-cert  Bilateral low back pain, with sciatica presence unspecified - Plan: PT plan of care cert/re-cert      Subjective Assessment - 06/09/14 0811    Symptoms Lower back pain.   Numbness both legs and feet.    Pertinent History She reports onset of pain 04/14/2014. She was having pain for years. 6-8 monhs ago she was able to exercise but since 4th she report spasms tha tworsened. She ahs seen chiropractor without benefit. She returned to work tlast week and pain increased.   Neurosurgeon said DDD with erniated disc.      Limitations Sitting;Lifting  She is limited with home tasks. 5-10 pounds max   How long can you sit comfortably? Not able    How long can you stand comfortably? Not able without discomfort   How long can you walk comfortably? Not able without [place]   Diagnostic tests MRI :DDD   Patient Stated Goals Decrease pain and spasm   Currently in Pain? Yes   Pain Score 8   with pain medication   Pain Location Back   Pain Orientation Right;Left   Pain Descriptors / Indicators Spasm;Numbness;Nagging  Knots, catching, on fire   Pain Type Acute pain   Pain Radiating Towards RT > LT    Pain Onset More than a month ago   Pain Frequency Constant   Aggravating Factors  Activity   Pain Relieving Factors Medication    Effect of Pain on Daily Activities Limits all activity   Multiple Pain Sites No          [facility] PT Assessment - 06/09/14 0819    Assessment   Medical Diagnosis LBP,DDD   Onset Date --  This has been going on for years but she notes 04/14/2014    Prior Therapy chiropractics   Precautions   Precautions --  10 pound lifiting, change postions. limit home tasks   Balance Screen   Has the patient fallen in the past 6 months No   Has the patient had a decrease in activity level because of a fear of falling?  No   Is the patient reluctant to leave their home because of a fear of falling?  No   Prior Function   Level of Independence Independent with basic ADLs  prior to 4th she was doing full duty work   Mining engineer Comments thoracic kyphosis   ROM / Strength   AROM / PROM / Strength AROM   AROM   Lumbar Flexion she can touch mid tibia   Lumbar Extension decr  80%   Lumbar - Right Side Bend decr 75%    Lumbar - Left Side Bend de cr 75%   Palpation   Palpation Some lumbar psrspinal tension noted.  Clinical Impression Statement:  She was unable to do much of the evaluation but appears to be pain dominant as she was observed  in lobby and with assessment appeared to ahve normal range but on testing had decreased range. We will try to help with pain with modalities and some manual treatment and tretching but I doubt this will make much difference  Pt will benefit from skilled therapeutic intervention in order to improve the following deficits: Pain; Postural Dysfuction   Rehab Potential: Fair  PT Frequency: 2/ week  PT Duration: 6 weeks  PT Interventions: Electrical Stimulation; moist heat; therapeutic activities, patient/family education, manual techniques; therapeutic exercise  PT Next Visit Plan: Modalities, manual, stretching  Consulted and agreed with plan of Care: Patient            Problem List There are no active problems to display  for this patient.   Darrel Hoover PT 06/09/2014, 8:49 AM  [facility] [address] [place], [place], [address] Phone: [phone]   Fax:  [phone]

## 2014-06-12 ENCOUNTER — Ambulatory Visit (HOSPITAL_COMMUNITY)
Admission: RE | Admit: 2014-06-12 | Discharge: 2014-06-12 | Disposition: A | Discharge status: Home / Self Care | Payer: 59 | Source: Ambulatory Visit | Attending: Neurosurgery | Admitting: Neurosurgery

## 2014-06-12 DIAGNOSIS — K769 Liver disease, unspecified: Secondary | ICD-10-CM

## 2014-06-12 DIAGNOSIS — K7689 Other specified diseases of liver: Secondary | ICD-10-CM | POA: Insufficient documentation

## 2014-06-12 MED ORDER — GADOXETATE DISODIUM 0.25 MMOL/ML IV SOLN
10.0000 mL | Freq: Once | INTRAVENOUS | Status: AC | PRN
  Administered 2014-06-12: 6 mL via INTRAVENOUS

## 2014-06-15 NOTE — Progress Notes (Signed)
  Medical screening examination/treatment/procedure(s) were performed by non-physician practitioner and as supervising physician I was immediately available for consultation/collaboration.     

## 2014-06-16 ENCOUNTER — Telehealth: Payer: Self-pay | Admitting: *Deleted

## 2014-06-16 NOTE — Telephone Encounter (Signed)
Faxed completed/signed forms to [employer] ADA program attention [facility], per Dr Zannie Cove. Confirmation page received at 4:38 pm.

## 2014-06-17 ENCOUNTER — Other Ambulatory Visit: Payer: Self-pay | Admitting: Orthopedic Surgery

## 2014-06-17 ENCOUNTER — Ambulatory Visit: Payer: 59 | Admitting: Rehabilitation

## 2014-06-17 DIAGNOSIS — M533 Sacrococcygeal disorders, not elsewhere classified: Principal | ICD-10-CM

## 2014-06-17 DIAGNOSIS — G8929 Other chronic pain: Secondary | ICD-10-CM

## 2014-06-18 ENCOUNTER — Ambulatory Visit: Payer: 59 | Admitting: Physical Therapy

## 2014-06-23 ENCOUNTER — Encounter: Payer: 59 | Admitting: Rehabilitation

## 2014-07-01 ENCOUNTER — Ambulatory Visit
Admission: RE | Admit: 2014-07-01 | Discharge: 2014-07-01 | Disposition: A | Discharge status: Home / Self Care | Payer: 59 | Source: Ambulatory Visit | Attending: Orthopedic Surgery | Admitting: Orthopedic Surgery

## 2014-07-01 DIAGNOSIS — G8929 Other chronic pain: Secondary | ICD-10-CM

## 2014-07-01 DIAGNOSIS — M533 Sacrococcygeal disorders, not elsewhere classified: Principal | ICD-10-CM

## 2014-08-01 ENCOUNTER — Ambulatory Visit: Payer: 59

## 2014-08-03 ENCOUNTER — Ambulatory Visit (INDEPENDENT_AMBULATORY_CARE_PROVIDER_SITE_OTHER): Payer: 59 | Admitting: Family Medicine

## 2014-08-03 VITALS — BP 114/72 | HR 90 | Temp 97.8°F | Resp 18 | Ht 67.0 in | Wt 138.0 lb

## 2014-08-03 DIAGNOSIS — L299 Pruritus, unspecified: Secondary | ICD-10-CM | POA: Diagnosis not present

## 2014-08-03 DIAGNOSIS — L0100 Impetigo, unspecified: Secondary | ICD-10-CM | POA: Diagnosis not present

## 2014-08-03 DIAGNOSIS — L255 Unspecified contact dermatitis due to plants, except food: Secondary | ICD-10-CM

## 2014-08-03 MED ORDER — DIPHENHYDRAMINE HCL 50 MG/ML IJ SOLN
50.0000 mg | Freq: Once | INTRAMUSCULAR | Status: AC
  Administered 2014-08-03: 50 mg via INTRAMUSCULAR

## 2014-08-03 MED ORDER — PREDNISONE 20 MG PO TABS
20.0000 mg | ORAL_TABLET | Freq: Every day | ORAL | Status: DC
Start: 2014-08-03 — End: 2015-04-15

## 2014-08-03 MED ORDER — MUPIROCIN 2 % EX OINT: 1.0000 "application " | TOPICAL_OINTMENT | Freq: Three times a day (TID) | CUTANEOUS | Status: DC

## 2014-08-03 NOTE — Progress Notes (Signed)
    MRN: [id number] DOB: 1965/06/03  Subjective:   Lindsey [name] is a 49 y.o. female presenting for chief complaint of Rash  Reports 1 week history of contact with poison sumac. Patient was not wanting to work outside but her friend needed her help, has a history of dermatitis to poison ivy, sumac, has resolved with steroid dose pack. Today reports severe itching, rash started over her forearms, has now spread to her legs. Lesions have been red, right forearm are in a linear distribution with some oozing, right thigh also has been oozing. Has tried benadryl every 2 hours for relief. Denies fevers, throat closing, tongue swelling, wheezing, chest tightness, wheezing, shob, n/v, abdominal pain, diarrhea. Patient works in [employer]. Denies any other aggravating or relieving factors, no other questions or concerns.  Lindsey [name] has a current medication list which includes the following prescription(s): acetaminophen, albuterol sulfate, fluticasone, gabapentin, hyoscyamine, ibuprofen, montelukast, and tizanidine. She is allergic to penicillins and sulfa antibiotics.  Lindsey [name]  has a past medical history of Asthma. Also  has no past surgical history on file.  ROS As in subjective.  Objective:   Vitals: BP 114/72 mmHg  Pulse 90  Temp(Src) 97.8 F (36.6 C)  Resp 18  Ht 5\' 7"  (1.702 m)  Wt 138 lb (62.596 kg)  BMI 21.61 kg/m2  SpO2 98%  Physical Exam  Constitutional: She is oriented to person, place, and time and well-developed, well-nourished, and in no distress.  HENT:  Mouth/Throat: Oropharynx is clear and moist. No oropharyngeal exudate.  Eyes: Conjunctivae are normal. Right eye exhibits no discharge. Left eye exhibits no discharge. No scleral icterus.  Cardiovascular: Normal rate, regular rhythm and intact distal pulses.  Exam reveals no gallop and no friction rub.   No murmur heard. Pulmonary/Chest: No stridor. No respiratory distress. She has no wheezes. She has no rales. She exhibits no tenderness.   Abdominal: Soft. Bowel sounds are normal. She exhibits no distension and no mass. There is no tenderness.  Lymphadenopathy:    She has no cervical adenopathy.  Neurological: She is alert and oriented to person, place, and time.  Skin: Skin is warm and dry. Rash (Erythematous lesions in clusters and in linear distribution over her radial and posterior forearms bilaterally, right forearm and right anterior thigh with cluster of erythematous oozing lesions) noted. Rash is urticarial (over lower extremities bilaterally).  Psychiatric:  Patient appears restless, states that she is seriously trying to avoid scratching, is pacing in exam room.   Assessment and Plan :   1. Contact dermatitis due to plant 2. Impetigo 3. Itching with irritation - Will start seven-day steroid course with one refill. Recommended patient obtain a refill if symptoms persist toward the end of 7 day steroid course. We'll also do Bactroban for impetigo lesions on right forearm and right anterior thigh. Provided patient with IM injection of Benadryl 50 mg in clinic, patient agreed to call husband to pick her up from clinic. - recommended patient stay home from work, continue using Benadryl however advised that she decrease use to no more than 50 mg every 6 hours, patient agreed.  Lindsey Eagles, PA-C [facility] [phone] 08/03/2014 11:36 AM   I reviewed the history and management with Mr. Tera Partridge Lindsey [name]

## 2014-08-03 NOTE — Patient Instructions (Addendum)
Day 1-3: Take 3 pills (60mg ).  Day 4-5: Take 2 pills (40mg ). Day 6-7: Take 1 pill (20mg ). You have 1 refill, so if your rash returns please get the refill and let me know how you are doing.   Poison [employer] ivy is a inflammation of the skin (contact dermatitis) caused by touching the allergens on the leaves of the ivy plant following previous exposure to the plant. The rash usually appears 48 hours after exposure. The rash is usually bumps (papules) or blisters (vesicles) in a linear pattern. Depending on your own sensitivity, the rash may simply cause redness and itching, or it may also progress to blisters which may break open. These must be well cared for to prevent secondary bacterial (germ) infection, followed by scarring. Keep any open areas dry, clean, dressed, and covered with an antibacterial ointment if needed. The eyes may also get puffy. The puffiness is worst in the morning and gets better as the day progresses. This dermatitis usually heals without scarring, within 2 to 3 weeks without treatment. HOME CARE INSTRUCTIONS  Thoroughly wash with soap and water as soon as you have been exposed to poison ivy. You have about one half hour to remove the plant resin before it will cause the rash. This washing will destroy the oil or antigen on the skin that is causing, or will cause, the rash. Be sure to wash under your fingernails as any plant resin there will continue to spread the rash. Do not rub skin vigorously when washing affected area. Poison ivy cannot spread if no oil from the plant remains on your body. A rash that has progressed to weeping sores will not spread the rash unless you have not washed thoroughly. It is also important to wash any clothes you have been wearing as these may carry active allergens. The rash will return if you wear the unwashed clothing, even several days later. Avoidance of the plant in the future is the best measure. Poison ivy plant can be recognized by the  number of leaves. Generally, poison ivy has three leaves with flowering branches on a single stem. Diphenhydramine may be purchased over the counter and used as needed for itching. Do not drive with this medication if it makes you drowsy.Ask your caregiver about medication for children. SEEK MEDICAL CARE IF:  Open sores develop.  Redness spreads beyond area of rash.  You notice purulent (pus-like) discharge.  You have increased pain.  Other signs of infection develop (such as fever). Document Released: 03/25/2000 Document Revised: 06/20/2011 Document Reviewed: 09/05/2008 [facility] Patient Information 2015 [place], [place]. This information is not intended to replace advice given to you by your health care provider. Make sure you discuss any questions you have with your health care provider.

## 2014-08-05 ENCOUNTER — Ambulatory Visit (INDEPENDENT_AMBULATORY_CARE_PROVIDER_SITE_OTHER): Payer: 59 | Admitting: Physician Assistant

## 2014-08-05 VITALS — BP 122/80 | HR 70 | Temp 97.4°F | Resp 20 | Ht 67.0 in | Wt 139.0 lb

## 2014-08-05 DIAGNOSIS — L237 Allergic contact dermatitis due to plants, except food: Secondary | ICD-10-CM | POA: Diagnosis not present

## 2014-08-05 MED ORDER — DIPHENHYDRAMINE HCL 50 MG/ML IJ SOLN
50.0000 mg | Freq: Once | INTRAMUSCULAR | Status: AC
  Administered 2014-08-05: 50 mg via INTRAMUSCULAR

## 2014-08-05 NOTE — Progress Notes (Signed)
Subjective:    Patient ID: Lindsey [name], female    DOB: 16-Nov-1965, 49 y.o.   MRN: [id number]  HPI  This is a 49 year old female who is here for follow up poison sumac dermatitis for past 9 days. She was seen here 2 days ago and put on steroid taper and mupirocin for impetigo lesions on forearm. Got a benadryl shot in office. She states the benadryl shot worked for 1.5 days and the wore off. She has been taking po bendryl 50 mg every two hours since then without relief. She states she is still getting new lesions. Last night she felt her tongue was swollen but that went away. She denies SOB or wheezing. She is unable to sleep at night d/t pruritus. She is wanting another shot of benadryl. She is supposed to work today.  Review of Systems  Constitutional: Negative for fever and chills.  HENT: Negative for congestion and facial swelling.   Eyes: Negative for itching.  Respiratory: Negative for shortness of breath and wheezing.   Gastrointestinal: Negative for nausea, vomiting and abdominal pain.  Skin: Positive for rash.  Allergic/Immunologic: Positive for environmental allergies.  Hematological: Negative for adenopathy.  Psychiatric/Behavioral: Positive for sleep disturbance.    There are no active problems to display for this patient.  Prior to Admission medications   Medication Sig Start Date End Date Taking? Authorizing Provider  Acetaminophen (TYLENOL ARTHRITIS PAIN PO) Take 2 capsules by mouth daily.    Yes Historical Provider, MD  Albuterol Sulfate 108 (90 BASE) MCG/ACT AEPB Inhale into the lungs.   Yes Historical Provider, MD  gabapentin (NEURONTIN) 300 MG capsule Take 1 capsule (300 mg total) by mouth at bedtime. 04/17/14  Yes Melony Overly, MD  hyoscyamine (LEVBID) 0.375 MG 12 hr tablet Take 0.375 mg by mouth 2 (two) times daily.   Yes Historical Provider, MD  ibuprofen (ADVIL,MOTRIN) 800 MG tablet Take 800 mg by mouth every 8 (eight) hours as needed.   Yes Historical Provider, MD   montelukast (SINGULAIR) 10 MG tablet Take 10 mg by mouth at bedtime.   Yes Historical Provider, MD  mupirocin ointment (BACTROBAN) 2 % Apply 1 application topically 3 (three) times daily. 08/03/14  Yes Jaynee Eagles, PA-C  predniSONE (DELTASONE) 20 MG tablet Take 1 tablet (20 mg total) by mouth daily with breakfast. 08/03/14  Yes Jaynee Eagles, PA-C  tiZANidine (ZANAFLEX) 4 MG capsule Take 4 mg by mouth 3 (three) times daily as needed for muscle spasms.   Yes Historical Provider, MD          Allergies  Allergen Reactions  . Penicillins   . Sulfa Antibiotics    Patient's social and family history were reviewed.     Objective:   Physical Exam  Constitutional: She is oriented to person, place, and time. She appears well-developed and well-nourished. No distress.  HENT:  Head: Normocephalic and atraumatic.  Right Ear: Hearing normal.  Left Ear: Hearing normal.  Nose: Nose normal.  Mouth/Throat: Uvula is midline, oropharynx is clear and moist and mucous membranes are normal.  No oral swelling  Eyes: Conjunctivae and lids are normal. Right eye exhibits no discharge. Left eye exhibits no discharge. No scleral icterus.  Cardiovascular: Normal rate, regular rhythm, normal heart sounds and normal pulses.   No murmur heard. Pulmonary/Chest: Effort normal and breath sounds normal. No respiratory distress. She has no wheezes. She has no rhonchi. She has no rales.  Musculoskeletal: Normal range of motion.  Lymphadenopathy:  Head (right side): No submental, no submandibular and no tonsillar adenopathy present.       Head (left side): No submental, no submandibular and no tonsillar adenopathy present.    She has no cervical adenopathy.  Neurological: She is alert and oriented to person, place, and time.  Skin: Skin is warm and dry.  Erythematous oozing lesions in linear distribution over bilateral forearms and right LE. Erythematous non-oozing lesions over anterior trunk.  Psychiatric: She has a  normal mood and affect. Her speech is normal and behavior is normal. Thought content normal.   BP 122/80 mmHg  Pulse 70  Temp(Src) 97.4 F (36.3 C) (Oral)  Resp 20  Ht 5\' 7"  (1.702 m)  Wt 139 lb (63.05 kg)  BMI 21.77 kg/m2  SpO2 98%     Assessment & Plan:  1. Allergic dermatitis due to poison sumac Continue pred 60 mg for next 2 days d/t continued symptoms, then start to taper. Gave benadryl 50 mg IM in office, she is a 3 minute drive from home. She agreed to drive home immediately after injection. Pt is very worried that she is going to have anaphylaxis - she was reassured that 9 days into illness, no SOB/wheezing, no evidence oral swelling on exam - anaphylaxis unlikely. She was given work note for next 2 days. She was advised if develops wheezing, SOB, oral swelling she should go to the ED. She should not take any other benadryl today and no more than every 6 hours.  - diphenhydrAMINE (BENADRYL) injection 50 mg; Inject 1 mL (50 mg total) into the muscle once.   Benjaman Pott Drenda Freeze, MHS [facility]  08/05/2014

## 2014-08-05 NOTE — Patient Instructions (Signed)
Continue prednisone taper. Do not take any more benadryl today. Go to the ED if you develop oral swelling or difficulty breathing.

## 2014-08-06 ENCOUNTER — Ambulatory Visit (INDEPENDENT_AMBULATORY_CARE_PROVIDER_SITE_OTHER): Payer: 59 | Admitting: Family Medicine

## 2014-08-06 VITALS — BP 100/72 | HR 81 | Temp 98.0°F | Resp 18 | Ht 67.0 in | Wt 139.0 lb

## 2014-08-06 DIAGNOSIS — L237 Allergic contact dermatitis due to plants, except food: Secondary | ICD-10-CM | POA: Diagnosis not present

## 2014-08-06 MED ORDER — METHYLPREDNISOLONE ACETATE 40 MG/ML IJ SUSP
80.0000 mg | Freq: Once | INTRAMUSCULAR | Status: AC
  Administered 2014-08-06: 80 mg via INTRAMUSCULAR

## 2014-08-06 NOTE — Patient Instructions (Signed)

## 2014-08-06 NOTE — Progress Notes (Signed)
° °  Subjective:  This chart was scribed for Robyn Haber MD, by Tamsen Roers, at [facility].  This patient was seen in room 8 and the patient's care was started at 12:54 PM.    Patient ID: Lindsey [name], female    DOB: Sep 08, 1965, 49 y.o.   MRN: [id number] Chief Complaint  Patient presents with   Follow-up   Poison Ivy     HPI  HPI Comments: Lindsey [name] is a 49 y.o. female who presents to [facility] for a follow up for poison ivy rash all over her body which has been going on for a week and a half. Patient was in the woods and was cutting down the vines on the trees. She has had two benadryl shots and is on the fourth day of a prednisone dose pack but states it is still spreading.  She has associated symptoms of weakness and diaphoresis. She does not have any pets.   Patient does medication histories at [facility].     Review of Systems  Constitutional: Positive for diaphoresis. Negative for fever and chills.  Gastrointestinal: Negative for nausea and vomiting.  Skin: Positive for rash.  Neurological: Positive for weakness.       Objective:   Physical Exam  Patient has drying erythematous rash on her right forearm and streaky fading erythematous rashes on her right lower extremity  Filed Vitals:   08/06/14 1242  BP: 100/72  Pulse: 81  Temp: 98 F (36.7 C)  TempSrc: Oral  Resp: 18  Height: 5\' 7"  (1.702 m)  Weight: 139 lb (63.05 kg)  SpO2: 98%        Assessment & Plan:   This chart was scribed in my presence and reviewed by me personally.    ICD-9-CM ICD-10-CM   1. Poison ivy dermatitis 692.6 L23.7 methylPREDNISolone acetate (DEPO-MEDROL) injection 80 mg     Signed, Robyn Haber, MD

## 2014-08-11 ENCOUNTER — Other Ambulatory Visit: Payer: Self-pay

## 2014-08-11 DIAGNOSIS — L255 Unspecified contact dermatitis due to plants, except food: Secondary | ICD-10-CM

## 2014-08-11 DIAGNOSIS — L0100 Impetigo, unspecified: Secondary | ICD-10-CM

## 2014-08-11 MED ORDER — MUPIROCIN 2 % EX OINT: 1.0000 "application " | TOPICAL_OINTMENT | Freq: Three times a day (TID) | CUTANEOUS | Status: DC

## 2014-08-21 ENCOUNTER — Telehealth: Payer: Self-pay

## 2014-08-21 NOTE — Telephone Encounter (Signed)
Received FMLA paperwork via fax on 08/21/14. She saw Dr.L on 08/03/14 and 08/06/14, Bennett Scrape on 4/26/1 for an allergic reaction to a plant. Patient was collectively put out of work from 4/24- 4/29. I completed paper work based on OV notes and will place in Dr. Lenn Cal box for review/signature. Please return to disability department upon completion, [place] or myself will scan into release #[id number], and fax to [phone].   Patient has not yes paid $15.00 fee

## 2014-08-26 NOTE — Telephone Encounter (Signed)
Scanned and faxed ppw, called and LMOM notifying patient

## 2014-08-28 ENCOUNTER — Ambulatory Visit (INDEPENDENT_AMBULATORY_CARE_PROVIDER_SITE_OTHER): Payer: 59 | Admitting: Physician Assistant

## 2014-08-28 VITALS — BP 120/72 | HR 61 | Temp 98.2°F | Resp 20 | Ht 67.0 in | Wt 138.5 lb

## 2014-08-28 DIAGNOSIS — T148 Other injury of unspecified body region: Secondary | ICD-10-CM

## 2014-08-28 DIAGNOSIS — R110 Nausea: Secondary | ICD-10-CM

## 2014-08-28 DIAGNOSIS — M533 Sacrococcygeal disorders, not elsewhere classified: Secondary | ICD-10-CM

## 2014-08-28 DIAGNOSIS — T148XXA Other injury of unspecified body region, initial encounter: Secondary | ICD-10-CM

## 2014-08-28 MED ORDER — ONDANSETRON 4 MG PO TBDP
4.0000 mg | ORAL_TABLET | Freq: Once | ORAL | Status: AC
  Administered 2014-08-28: 4 mg via ORAL

## 2014-08-28 MED ORDER — KETOROLAC TROMETHAMINE 60 MG/2ML IM SOLN
60.0000 mg | Freq: Once | INTRAMUSCULAR | Status: AC
  Administered 2014-08-28: 60 mg via INTRAMUSCULAR

## 2014-08-28 MED ORDER — MUPIROCIN 2 % EX OINT: 1.0000 "application " | TOPICAL_OINTMENT | Freq: Three times a day (TID) | CUTANEOUS | Status: DC

## 2014-08-28 NOTE — Patient Instructions (Signed)
Ibuprofen for pain. May alternate ice and heating pad for 15 minutes 3-4x daily.
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
## 2014-08-28 NOTE — Addendum Note (Signed)
Addended by: Nolene Bernheim on: 08/28/2014 06:45 PM   Modules accepted: Orders

## 2014-11-26 ENCOUNTER — Ambulatory Visit (INDEPENDENT_AMBULATORY_CARE_PROVIDER_SITE_OTHER): Payer: 59 | Admitting: Sports Medicine

## 2014-11-26 ENCOUNTER — Encounter: Payer: Self-pay | Admitting: Sports Medicine

## 2014-11-26 VITALS — BP 106/73 | HR 69 | Ht 66.0 in | Wt 143.0 lb

## 2014-11-26 DIAGNOSIS — G8929 Other chronic pain: Secondary | ICD-10-CM

## 2014-11-26 DIAGNOSIS — M533 Sacrococcygeal disorders, not elsewhere classified: Secondary | ICD-10-CM | POA: Insufficient documentation

## 2014-11-26 DIAGNOSIS — M357 Hypermobility syndrome: Secondary | ICD-10-CM | POA: Insufficient documentation

## 2014-11-26 HISTORY — DX: Sacrococcygeal disorders, not elsewhere classified: M53.3

## 2014-11-26 HISTORY — DX: Hypermobility syndrome: M35.7

## 2014-11-26 MED ORDER — AMITRIPTYLINE HCL 25 MG PO TABS: 25.0000 mg | ORAL_TABLET | Freq: Every day | ORAL | Status: DC
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
## 2014-11-26 NOTE — Patient Instructions (Addendum)
Run 15-30 minutes every other day Keep doing pelvic tilts Continue doing pretzel stretches and crossover stretches Do standing rotations 10 times to 45 degrees Do lateral step-up exercises Do cross-over step-up exercises  STOP trazodone, start amitriptyline 25mg  at night.   We will refer you for physical therapy and pilates.   You have a condition called Ehlers-Danlos Type 3 and you have a Brighton Score of 7.

## 2014-11-26 NOTE — Assessment & Plan Note (Signed)
Patient's pain most likely due to hyper-mobility and repeated subluxations in her SI joints. Can consider underlying Ehlers-Danlos Type 3 given family history of hyperflexibility and Brighton Score of 7. Will refer patient to have physical therapy. Also discussed varying strengthening exercises for patient including pretzel stretches, crossover stretches, standing rotations, and step up exercises.  For difficulty sleeping, will stop trazodone and start amitriptyline.

## 2014-12-12 ENCOUNTER — Ambulatory Visit: Payer: 59 | Attending: Sports Medicine | Admitting: Physical Therapy

## 2014-12-12 ENCOUNTER — Encounter: Payer: Self-pay | Admitting: Physical Therapy

## 2014-12-12 DIAGNOSIS — M533 Sacrococcygeal disorders, not elsewhere classified: Secondary | ICD-10-CM | POA: Insufficient documentation

## 2014-12-12 DIAGNOSIS — M532X8 Spinal instabilities, sacral and sacrococcygeal region: Secondary | ICD-10-CM

## 2014-12-12 DIAGNOSIS — Q796 Ehlers-Danlos syndrome, unspecified: Secondary | ICD-10-CM

## 2014-12-12 DIAGNOSIS — R293 Abnormal posture: Secondary | ICD-10-CM | POA: Insufficient documentation

## 2014-12-12 DIAGNOSIS — M62838 Other muscle spasm: Secondary | ICD-10-CM | POA: Diagnosis present

## 2014-12-12 DIAGNOSIS — M545 Low back pain: Secondary | ICD-10-CM | POA: Diagnosis present

## 2014-12-12 DIAGNOSIS — M6281 Muscle weakness (generalized): Secondary | ICD-10-CM | POA: Diagnosis present

## 2014-12-12 DIAGNOSIS — R29898 Other symptoms and signs involving the musculoskeletal system: Secondary | ICD-10-CM

## 2014-12-12 NOTE — Therapy (Signed)
[place], [place], [address] Phone: [phone]   Fax:  [phone]  Physical Therapy Evaluation  Patient Details  Name: Lindsey [name] MRN: [id number] Date of Birth: 1965/08/24 Referring Provider:  Stefanie Libel, MD  Encounter Date: 12/12/2014      PT End of Session - 12/12/14 1100    Visit Number 1   Number of Visits 12   PT Start Time 1025   PT Stop Time 1115   PT Time Calculation (min) 50 min   Activity Tolerance Patient limited by pain   Behavior During Therapy [facility] for tasks assessed/performed      Past Medical History  Diagnosis Date  . Asthma     History reviewed. No pertinent past surgical history.  There were no vitals filed for this visit.  Visit Diagnosis:  Ehlers-Danlos syndrome  Instability of sacroiliac joint  Weakness of back      Subjective Assessment - 12/12/14 1028    Subjective Pt presents with chronic Rt. sided low back pain and SIJ instability.  She was diagnosed with EDS Nov 26, 2014.  She was prev seen at Urgent care and Cassie Freer (April).  Prev PT has made worse.  Saw chiro 2 weeks with incr pain.  She has Rt. LE numbness and feels weaker.  Joint crepitus in hips, knees as well.     Pertinent History Pain has worsened since Jan, reports misdiagnosis initially.    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 never   How long can you stand comfortably? better than sit, >10 min    How long can you walk comfortably? walks 3 miles a day with work, gets tired, limps end of day   Diagnostic tests Minimal lumbar spondylosis and facet arthrosis without stenosis on MRI done 05/20/14   Patient Stated Goals I want to figure out if I can strengthen and improve pain    Currently in Pain? Yes   Pain Score 4   pre med   Pain Location Back   Pain Orientation Right   Pain  Descriptors / Indicators Aching;Sharp  sharp with popping   Pain Type Chronic pain   Pain Radiating Towards Rt. post thigh to bottom of foot and toes (numb>pain)   Pain Onset More than a month ago   Pain Frequency Constant   Aggravating Factors  any activity, speed bumps, walking, sit too long   Pain Relieving Factors meds, ice   Effect of Pain on Daily Activities limits work schedule, very painful to work   Multiple Pain Sites No            [facility] PT Assessment - 12/12/14 1033    Assessment   Medical Diagnosis SIJ pain, EDS   Onset Date/Surgical Date --  Jan 2016   Prior Therapy yes, PT and chiro   Precautions   Precautions None   Restrictions   Weight Bearing Restrictions No   Balance Screen   Has the patient fallen in the past 6 months No   [place] residence   Prior Function   Level of Independence Independent   Cognition   Overall Cognitive Status Impaired/Different from baseline   Observation/Other Assessments   Focus on Therapeutic Outcomes (FOTO)  NT   Sensation   Light Touch Appears Intact   Additional Comments numb post Rt    Coordination   Gross Motor  Movements are Fluid and Coordinated Not tested   Posture/Postural Control   Posture/Postural Control Postural limitations   Postural Limitations Rounded Shoulders;Forward head;Increased thoracic kyphosis;Right pelvic obliquity  high Rt. iliac crest, high Rt. ASIS   Posture Comments thoracic kyphosis   AROM   Right/Left Knee --  Rt. -10, Lt. 0 deg   Lumbar Flexion touches floor with fingertips   Lumbar Extension WNL   Lumbar - Right Side Bend WNL   Lumbar - Left Side Bend WNL   Lumbar - Right Rotation WNL   Lumbar - Left Rotation WNL   Strength   Right Hip Flexion 5/5   Right Hip Extension 4/5  pain   Right Hip ABduction 3+/5  pain   Left Hip Flexion 5/5   Left Hip Extension 4/5   Right/Left Knee --  WNL   Right/Left Ankle --  WNL   Palpation   Spinal  mobility hypermobility noted in LS spine   SI assessment  pain lateral SIJ border   Palpation comment sore into Rt. gluteals and piriformis   Pelvic Compression   Findings Positive   Side Right   Gaenslen's test   Findings Negative   Side  --  bilat   Sacral Compression   Findings Positive   Side  Right   Static Standing Balance   Static Standing - Balance Support No upper extremity supported   Static Standing Balance -  Activities  Single Leg Stance - Right Leg;Single Leg Stance - Left Leg   Static Standing - Comment/# of Minutes neg trendelenburg, can do 10-15 sec                   [facility] Adult PT Treatment/Exercise - 12/12/14 1033    Self-Care   Posture neutral spine/pelvis and avoiding unilateral LE movements   Other Self-Care Comments  HEP, SI Belt, hypermobility   Lumbar Exercises: Supine   Ab Set 5 reps   Clam 10 reps   Bridge 10 reps   Bridge Limitations ball, flat back                PT Education - 12/12/14 1137    Education provided Yes   Education Details PT/POC, SIJ and HEP for core, avoiding unilateral movements and SI belt   Person(s) Educated Patient   Methods Explanation;Demonstration;Handout   Comprehension Verbalized understanding;Returned demonstration          PT Short Term Goals - 12/12/14 1156    PT SHORT TERM GOAL #1   Title She will be independent with inital HEP   Time 4   Status New   PT SHORT TERM GOAL #2   Title She will report pain decrease 25% or more with transitional movements (esp sitting)   Time 4   Period Weeks   Status New   PT SHORT TERM GOAL #3   Title Pt will be able to report less pain end of work day (25% less) with use of belt if needed   Time 4   Status New   PT SHORT TERM GOAL #4   Title Pt will understand concepts of stability and posture as it relates to her joint condition.    Time 4   Period Weeks   Status New           PT Long Term Goals - 12/12/14 1158    PT LONG TERM GOAL #1    Title independent with intial and all HEP issued as of last visit  Time 8   Period Weeks   Status New   PT LONG TERM GOAL #2   Title Pt  will report pain decreased 50% or more and able to drive to work with minimal pain increase   Time 8   Period Weeks   Status New   PT LONG TERM GOAL #3   Title Pt will be able to stand for up to an hour with min pain increase   Time 8   Period Weeks   Status New   PT LONG TERM GOAL #4   Title Pt will be able to do stairs without pain increase for community mobility   Time 8   Period Weeks   Status New   PT LONG TERM GOAL #5   Title Pt will continue running without pain increase normal distances   Time 8   Period Weeks   Status New               Plan - 12/12/14 1138    Clinical Impression Statement Patient has increased pain in Rt. L5-S1 and into Rt. hip musculature, radicular into posterior thigh with sensory deficits.  SI belt improved comfort with walking.  Overall she has normal strength, pain dominates.  She scores 6/9 on the Beighton score (patient mentioned Brighton scale).  She will benefit from skilled PT to improve her ability to work, maintain fitness level and improve quality of life.    Pt will benefit from skilled therapeutic intervention in order to improve on the following deficits Decreased strength;Improper body mechanics;Postural dysfunction;Difficulty walking;Decreased mobility;Hypermobility;Decreased balance;Pain;Increased fascial restricitons   Rehab Potential Good   PT Frequency 2x / week   PT Duration 8 weeks   PT Treatment/Interventions Electrical Stimulation;Cryotherapy;Iontophoresis 4mg /ml Dexamethasone;Ultrasound;Patient/family education;Taping;Dry needling;Neuromuscular re-education;Balance training;Therapeutic exercise;Manual techniques;Other (comment)  Pilates based PT   PT Next Visit Plan explain Brighton vs.Beighton, check HEP and advance stabilization, Pilates Refomer, modailiteis for pain   PT Home  Exercise Plan bridge and clam, Tr A   Recommended Other Services Rec purchase of SI belt   Consulted and Agree with Plan of Care Patient       By signing I understand that I am ordering/authorizing the use of Iontophoresis using 4 mg/mL of dexamethasone as a component of this plan of care.  Problem List Patient Active Problem List   Diagnosis Date Noted  . Sacroiliac joint dysfunction of right side 11/26/2014  . Hypermobility syndrome 11/26/2014    Iori Gigante 12/12/2014, 12:01 PM  [facility] [address] [place], [place], [address] Phone: [phone]   Fax:  [phone]   Raeford Razor, PT 12/12/2014 12:01 PM Phone: [phone] Fax: [phone]

## 2014-12-12 NOTE — Patient Instructions (Signed)
Bridge [employer] small of back into mat, maintain pelvic tilt, roll up one vertebrae at a time. Focus on engaging posterior hip muscles. Hold for _1-2___ breaths. Repeat __10__ times.  Copyright  VHI. All rights reserved.

## 2014-12-16 ENCOUNTER — Telehealth: Payer: Self-pay | Admitting: Sports Medicine

## 2014-12-22 ENCOUNTER — Other Ambulatory Visit: Payer: Self-pay | Admitting: *Deleted

## 2014-12-25 ENCOUNTER — Other Ambulatory Visit: Payer: Self-pay | Admitting: *Deleted

## 2014-12-25 DIAGNOSIS — M357 Hypermobility syndrome: Secondary | ICD-10-CM

## 2014-12-25 DIAGNOSIS — M533 Sacrococcygeal disorders, not elsewhere classified: Secondary | ICD-10-CM

## 2014-12-31 ENCOUNTER — Ambulatory Visit: Payer: 59 | Admitting: Physical Therapy

## 2014-12-31 DIAGNOSIS — M532X8 Spinal instabilities, sacral and sacrococcygeal region: Secondary | ICD-10-CM

## 2014-12-31 DIAGNOSIS — M62838 Other muscle spasm: Secondary | ICD-10-CM

## 2014-12-31 DIAGNOSIS — R29898 Other symptoms and signs involving the musculoskeletal system: Secondary | ICD-10-CM

## 2014-12-31 DIAGNOSIS — M545 Low back pain: Secondary | ICD-10-CM

## 2014-12-31 DIAGNOSIS — Q796 Ehlers-Danlos syndrome, unspecified: Secondary | ICD-10-CM

## 2014-12-31 DIAGNOSIS — R293 Abnormal posture: Secondary | ICD-10-CM

## 2014-12-31 NOTE — Therapy (Signed)
[place], [place], [address] Phone: [phone]   Fax:  [phone]  Physical Therapy Treatment  Patient Details  Name: Lindsey [name] MRN: [id number] Date of Birth: July 08, 1965 Referring Provider:  Drenda Freeze, MD  Encounter Date: 12/31/2014      PT End of Session - 12/31/14 1035    Visit Number 2   Number of Visits 12   PT Start Time 0943   PT Stop Time 1040   PT Time Calculation (min) 57 min   Activity Tolerance Patient tolerated treatment well;Patient limited by pain   Behavior During Therapy [facility] for tasks assessed/performed  talkative      Past Medical History  Diagnosis Date  . Asthma     No past surgical history on file.  There were no vitals filed for this visit.  Visit Diagnosis:  Ehlers-Danlos syndrome  Instability of sacroiliac joint  Weakness of back  Abnormal posture  Muscle spasm  Bilateral low back pain, with sciatica presence unspecified      Subjective Assessment - 12/31/14 0944    Subjective Pt states it took 3 weeks to get an appointment for a brace but she will be getting one. She has been run/walking every other day and having sharp pains following. She c/o burning pain today in her SI area. She has been walking alot at work and is trying to get a position that allows her to sit more.    Currently in Pain? Yes   Pain Score 4   without meds 10/10   Pain Location Back   Pain Orientation Left;Right   Pain Frequency Intermittent   Multiple Pain Sites Yes   Pain Score 4   Pain Location Other (Comment)  SI joint   Pain Orientation Right   Pain Descriptors / Indicators Burning   Pain Type Chronic pain   Pain Onset More than a month ago   Pain Frequency Intermittent   Aggravating Factors  running   Pain Relieving Factors meds bring pain to a 4 without it it can get to a 10           [facility] Adult PT Treatment/Exercise - 12/31/14 1011    Self-Care   Posture  neutral spine, pelvis and supine to sit to supine with abdominal support   Other Self-Care Comments  advised to avoid dumbbells at gym, no running, pay attn to posture   Lumbar Exercises: Supine   Ab Set 15 reps   Clam 15 reps   Heel Slides 10 reps   Bridge 15 reps   Bridge Limitations with clam x20   Lumbar Exercises: Sidelying   Clam 10 reps   Other Sidelying Lumbar Exercises cues for body mechanics   Modalities   Modalities Cryotherapy   Cryotherapy   Number Minutes Cryotherapy 10 Minutes   Cryotherapy Location Lumbar Spine   Type of Cryotherapy Ice pack                  PT Short Term Goals - 12/31/14 1034    PT SHORT TERM GOAL #1   Title She will be independent with inital HEP   Status On-going   PT SHORT TERM GOAL #2   Title She will report pain decrease 25% or more with transitional movements (esp sitting)   Status On-going   PT SHORT TERM GOAL #3   Title Pt will be able to report less pain end of work day (25% less) with use of belt  if needed   Status On-going   PT SHORT TERM GOAL #4   Title Pt will understand concepts of stability and posture as it relates to her joint condition.    Status Achieved           PT Long Term Goals - 12/31/14 1034    PT LONG TERM GOAL #1   Title independent with intial and all HEP issued as of last visit   Status On-going   PT LONG TERM GOAL #2   Title Pt  will report pain decreased 50% or more and able to drive to work with minimal pain increase   Status On-going   PT LONG TERM GOAL #3   Title Pt will be able to stand for up to an hour with min pain increase   Status On-going   PT LONG TERM GOAL #4   Title Pt will be able to do stairs without pain increase for community mobility   Status On-going   PT LONG TERM GOAL #5   Title Pt will continue running without pain increase normal distances   Status On-going               Plan - 12/31/14 1033    Clinical Impression Statement Emphasized education/self care  today, stabilization concepts and importance of consistent HEP daily.  NO goals met, cont.    PT Next Visit Plan check HEP and advance stabilization, Pilates Refomer, modailiteis for pain   PT Home Exercise Plan bridge and clam, Tr A   Consulted and Agree with Plan of Care Patient        Problem List Patient Active Problem List   Diagnosis Date Noted  . Sacroiliac joint dysfunction of right side 11/26/2014  . Hypermobility syndrome 11/26/2014    PAA,JENNIFER 12/31/2014, 10:37 AM  [facility] [address] [place], [place], [address] Phone: [phone]   Fax:  [phone]    Raeford Razor, PT 12/31/2014 10:37 AM Phone: [phone] Fax: [phone]

## 2015-01-02 ENCOUNTER — Ambulatory Visit: Payer: 59 | Admitting: Physical Therapy

## 2015-01-02 DIAGNOSIS — M532X8 Spinal instabilities, sacral and sacrococcygeal region: Secondary | ICD-10-CM

## 2015-01-02 DIAGNOSIS — Q796 Ehlers-Danlos syndrome, unspecified: Secondary | ICD-10-CM

## 2015-01-02 DIAGNOSIS — R29898 Other symptoms and signs involving the musculoskeletal system: Secondary | ICD-10-CM

## 2015-01-02 DIAGNOSIS — M62838 Other muscle spasm: Secondary | ICD-10-CM

## 2015-01-02 DIAGNOSIS — R293 Abnormal posture: Secondary | ICD-10-CM

## 2015-01-02 DIAGNOSIS — M545 Low back pain: Secondary | ICD-10-CM

## 2015-01-02 NOTE — Therapy (Signed)
[place], [place], [address] Phone: [phone]   Fax:  [phone]  Physical Therapy Treatment  Patient Details  Name: Lindsey [name] MRN: [id number] Date of Birth: April 15, 1965 Referring Provider:  Drenda Freeze, MD  Encounter Date: 01/02/2015      PT End of Session - 01/02/15 1053    Visit Number 3   Number of Visits 12   PT Start Time 1015   PT Stop Time 1115   PT Time Calculation (min) 60 min   Activity Tolerance Patient tolerated treatment well   Behavior During Therapy [facility] for tasks assessed/performed      Past Medical History  Diagnosis Date  . Asthma     No past surgical history on file.  There were no vitals filed for this visit.  Visit Diagnosis:  Ehlers-Danlos syndrome  Instability of sacroiliac joint  Weakness of back  Muscle spasm  Bilateral low back pain, with sciatica presence unspecified  Abnormal posture      Subjective Assessment - 01/02/15 1046    Subjective Pt. states pain with driving to clinic but none once she got into the clinic. She has felt good since the last visit, however she mentioned driving feels like it undoes everything after leaving therapy especially speed bumps. She went for SI joint belt consult visit and will be getting the belt on monday.   Currently in Pain? No/denies   Pain Score 0-No pain   Pain Location Back   Pain Score 0               [facility] Adult PT Treatment/Exercise - 01/02/15 1049    Lumbar Exercises: Supine   Other Supine Lumbar Exercises Foam roller: alternating arms, pelvic tilt, marching, clams, bridge   Other Supine Lumbar Exercises Pilates Reformer: Foot work 2 [employer] and 1 Blue parallel and turnout, single leg 2 Red. Spent incr time initially to recruit Tr A in isolation.  Supine Arm Arcs 1 Red V and Parallel x 10 each, pt fatigued      Recumbant bike L 4, 8 min for strengthening, endurance Ice pack post PT 10  min          PT Education - 01/02/15 1052    Education provided Yes   Education Details Pilates Reformer education, body alignment   Person(s) Educated Patient   Methods Explanation;Demonstration   Comprehension Verbalized understanding;Returned demonstration          PT Short Term Goals - 12/31/14 1034    PT SHORT TERM GOAL #1   Title She will be independent with inital HEP   Status On-going   PT SHORT TERM GOAL #2   Title She will report pain decrease 25% or more with transitional movements (esp sitting)   Status On-going   PT SHORT TERM GOAL #3   Title Pt will be able to report less pain end of work day (25% less) with use of belt if needed   Status On-going   PT SHORT TERM GOAL #4   Title Pt will understand concepts of stability and posture as it relates to her joint condition.    Status Achieved           PT Long Term Goals - 12/31/14 1034    PT LONG TERM GOAL #1   Title independent with intial and all HEP issued as of last visit   Status On-going   PT LONG TERM GOAL #2   Title Pt  will  report pain decreased 50% or more and able to drive to work with minimal pain increase   Status On-going   PT LONG TERM GOAL #3   Title Pt will be able to stand for up to an hour with min pain increase   Status On-going   PT LONG TERM GOAL #4   Title Pt will be able to do stairs without pain increase for community mobility   Status On-going   PT LONG TERM GOAL #5   Title Pt will continue running without pain increase normal distances   Status On-going               Plan - 01/02/15 1054    Clinical Impression Statement Pt will be getting SI joint belt on Monday. She needs verbal cues for stabilization exercises form/technique due to increased amounts of talking which cause her to lose focus on what she is doing. Pt. was able to perform stabilization exercsies on pilates reformer with increased verbal and tactile cues for body alignment.Goals still on going.   PT  Next Visit Plan assess SI joint belt, pilates reformer, modalities for pain if present, stabilization progression   PT Home Exercise Plan no new exercises given   Consulted and Agree with Plan of Care Patient        Problem List Patient Active Problem List   Diagnosis Date Noted  . Sacroiliac joint dysfunction of right side 11/26/2014  . Hypermobility syndrome 11/26/2014    Yolando Gillum 01/02/2015, 12:01 PM  [facility] [address] [place], [place], [address] Phone: [phone]   Fax:  [phone]   Raeford Razor, PT 01/02/2015 12:02 PM Phone: [phone] Fax: [phone]

## 2015-01-06 ENCOUNTER — Ambulatory Visit: Payer: 59 | Admitting: Physical Therapy

## 2015-01-06 DIAGNOSIS — Q796 Ehlers-Danlos syndrome, unspecified: Secondary | ICD-10-CM

## 2015-01-06 DIAGNOSIS — M532X8 Spinal instabilities, sacral and sacrococcygeal region: Secondary | ICD-10-CM

## 2015-01-06 DIAGNOSIS — M545 Low back pain: Secondary | ICD-10-CM

## 2015-01-06 DIAGNOSIS — R293 Abnormal posture: Secondary | ICD-10-CM

## 2015-01-06 DIAGNOSIS — M62838 Other muscle spasm: Secondary | ICD-10-CM

## 2015-01-06 DIAGNOSIS — R29898 Other symptoms and signs involving the musculoskeletal system: Secondary | ICD-10-CM

## 2015-01-06 NOTE — Therapy (Signed)
[place], [place], [address] Phone: [phone]   Fax:  [phone]  Physical Therapy Treatment  Patient Details  Name: Lindsey [name] MRN: [id number] Date of Birth: Sep 02, 1965 Referring Provider:  Stefanie Libel, MD  Encounter Date: 01/06/2015      PT End of Session - 01/06/15 1032    Visit Number 4   Number of Visits 12   Date for PT Re-Evaluation 01/23/15   PT Start Time 1020   PT Stop Time 1110   PT Time Calculation (min) 50 min      Past Medical History  Diagnosis Date  . Asthma     No past surgical history on file.  There were no vitals filed for this visit.  Visit Diagnosis:  Ehlers-Danlos syndrome  Instability of sacroiliac joint  Weakness of back  Bilateral low back pain, with sciatica presence unspecified  Abnormal posture  Muscle spasm      Subjective Assessment - 01/06/15 1030    Currently in Pain? Yes   Pain Score 4    Pain Location Back   Pain Orientation Left;Right   Pain Descriptors / Indicators Aching   Aggravating Factors  driving, speed bumps   Pain Relieving Factors meds                         [facility] Adult PT Treatment/Exercise - 01/06/15 1037    Self-Care   Self-Care Other Self-Care Comments   Other Self-Care Comments  Where to purchase SI belt online   Lumbar Exercises: Supine   Clam 20 reps   Heel Slides 20 reps   Bent Knee Raise 20 reps   Bridge 15 reps   Bridge Limitations with clam  x 10   Straight Leg Raise 10 reps   Other Supine Lumbar Exercises Bridge with ball squeeze x10   Modalities   Modalities Cryotherapy   Cryotherapy   Number Minutes Cryotherapy 10 Minutes   Cryotherapy Location Lumbar Spine   Type of Cryotherapy Ice pack                PT Education - 01/06/15 1105    Education provided Yes   Education Details prepilates hep   Person(s) Educated Patient   Methods Explanation;Handout   Comprehension  Verbalized understanding          PT Short Term Goals - 12/31/14 1034    PT SHORT TERM GOAL #1   Title She will be independent with inital HEP   Status On-going   PT SHORT TERM GOAL #2   Title She will report pain decrease 25% or more with transitional movements (esp sitting)   Status On-going   PT SHORT TERM GOAL #3   Title Pt will be able to report less pain end of work day (25% less) with use of belt if needed   Status On-going   PT SHORT TERM GOAL #4   Title Pt will understand concepts of stability and posture as it relates to her joint condition.    Status Achieved           PT Long Term Goals - 12/31/14 1034    PT LONG TERM GOAL #1   Title independent with intial and all HEP issued as of last visit   Status On-going   PT LONG TERM GOAL #2   Title Pt  will report pain decreased 50% or more and able to drive to work with minimal  pain increase   Status On-going   PT LONG TERM GOAL #3   Title Pt will be able to stand for up to an hour with min pain increase   Status On-going   PT LONG TERM GOAL #4   Title Pt will be able to do stairs without pain increase for community mobility   Status On-going   PT LONG TERM GOAL #5   Title Pt will continue running without pain increase normal distances   Status On-going               Plan - 01/06/15 1106    Clinical Impression Statement Pt has SI belt and reports some decrease in pain with driving. The SI belt is not the same as the one we have here. She was given the name and where to purchase online. Instructed pt in supine lumbar stabilization with pt requiring moderate cues for neutral spine and technique. Pt issued Prepilates HEP. Ice post exercise as previous.    PT Next Visit Plan assess SI joint belt, pilates reformer, modalities for pain if present, stabilization progression        Problem List Patient Active Problem List   Diagnosis Date Noted  . Sacroiliac joint dysfunction of right side 11/26/2014  .  Hypermobility syndrome 11/26/2014    Dorene Ar, PTA 01/06/2015, 11:13 AM  [facility] [address] [place], [place], [address] Phone: [phone]   Fax:  [phone]    With respect to work duties and abilities, at this point I have not seen her perform lifting, pushing, pulling and walking activities. We have given her an HEP thus far.  We will address more functional activties in standing and give education in addition to progressing strength.  I recommend she continue PT, daily HEP, eliminate running, replace with walking on non-work days and use caution with steps, changing positions in bed and wear SI Belt for work and extensive walking days.  She may benefit from a FCE if further info is needed, but once PT is completed.   Raeford Razor, PT 01/06/2015 10:04 PM Phone: [phone] Fax: [phone]

## 2015-01-06 NOTE — Patient Instructions (Signed)

## 2015-01-09 ENCOUNTER — Encounter: Payer: 59 | Admitting: Physical Therapy

## 2015-01-13 ENCOUNTER — Ambulatory Visit: Payer: 59 | Attending: Sports Medicine | Admitting: Physical Therapy

## 2015-01-13 DIAGNOSIS — M545 Low back pain: Secondary | ICD-10-CM | POA: Insufficient documentation

## 2015-01-13 DIAGNOSIS — M532X8 Spinal instabilities, sacral and sacrococcygeal region: Secondary | ICD-10-CM

## 2015-01-13 DIAGNOSIS — M62838 Other muscle spasm: Secondary | ICD-10-CM | POA: Diagnosis present

## 2015-01-13 DIAGNOSIS — R29898 Other symptoms and signs involving the musculoskeletal system: Secondary | ICD-10-CM

## 2015-01-13 DIAGNOSIS — M533 Sacrococcygeal disorders, not elsewhere classified: Secondary | ICD-10-CM | POA: Insufficient documentation

## 2015-01-13 DIAGNOSIS — R293 Abnormal posture: Secondary | ICD-10-CM | POA: Diagnosis present

## 2015-01-13 DIAGNOSIS — Q796 Ehlers-Danlos syndrome, unspecified: Secondary | ICD-10-CM

## 2015-01-13 DIAGNOSIS — M6281 Muscle weakness (generalized): Secondary | ICD-10-CM | POA: Diagnosis present

## 2015-01-13 NOTE — Therapy (Signed)
[name], [place], [address] Phone: [phone]   Fax:  [phone]  Physical Therapy Treatment  Patient Details  Name: Lindsey [name] MRN: [id number] Date of Birth: 10-16-1965 Referring Provider:  Drenda Freeze, MD  Encounter Date: 01/13/2015      PT End of Session - 01/13/15 1324    Visit Number 5   Number of Visits 12   Date for PT Re-Evaluation 01/23/15   PT Start Time 1020   PT Stop Time 1115   PT Time Calculation (min) 55 min   Activity Tolerance Patient tolerated treatment well   Behavior During Therapy Lindsey [name] for tasks assessed/performed      Past Medical History  Diagnosis Date  . Asthma     No past surgical history on file.  There were no vitals filed for this visit.  Visit Diagnosis:  Ehlers-Danlos syndrome  Instability of sacroiliac joint  Weakness of back  Bilateral low back pain, with sciatica presence unspecified  Abnormal posture  Muscle spasm      Subjective Assessment - 01/13/15 1023    Subjective Took the belt back and got another one from [employer] but is also waiting for another one for different options.  Just got back from vacation.  I feel like I can do exercises that I could do before.    Currently in Pain? Yes  not medicated   Pain Score 3    Pain Location Back   Pain Orientation Left;Right;Posterior   Pain Descriptors / Indicators Aching   Pain Type Chronic pain   Pain Onset More than a month ago   Pain Frequency Intermittent   Aggravating Factors  driving, jarring, walking   Pain Relieving Factors meds, SI belt          [facility] Adult PT Treatment/Exercise - 01/13/15 1032    Lumbar Exercises: Standing   Other Standing Lumbar Exercises Freemotion 2 plates lat pull down x 20   Other Standing Lumbar Exercises bilateral forward flexion single leg 1 plate and oblique small ROM rotation 1 plate   Lumbar Exercises: Supine   Ab Set 15 reps   Clam 20 reps   Clam Limitations alos done unilaterall with ball  x10    Heel Slides 20 reps   Bent Knee Raise 20 reps   Bridge 20 reps   Other Supine Lumbar Exercises used ball between knees   Other Supine Lumbar Exercises Pre-Pilates   Cryotherapy   Number Minutes Cryotherapy 10 Minutes   Cryotherapy Location Lumbar Spine   Type of Cryotherapy Ice pack            PT Education - 01/13/15 1040    Education provided Yes   Education Details concepts for stabilization   Person(s) Educated Patient   Methods Explanation   Comprehension Verbalized understanding          PT Short Term Goals - 01/13/15 1044    PT SHORT TERM GOAL #1   Title She will be independent with inital HEP   Status Achieved   PT SHORT TERM GOAL #2   Title She will report pain decrease 25% or more with transitional movements (esp sitting)   Status On-going   PT SHORT TERM GOAL #3   Title Pt will be able to report less pain end of work day (25% less) with use of belt if needed   Status On-going   PT SHORT TERM GOAL #4   Title Pt will understand  concepts of stability and posture as it relates to her joint condition.    Status Achieved           PT Long Term Goals - 01/13/15 1051    PT LONG TERM GOAL #1   Title independent with intial and all HEP issued as of last visit   Status On-going   PT LONG TERM GOAL #2   Title Pt  will report pain decreased 50% or more and able to drive to work with minimal pain increase   Status On-going   PT LONG TERM GOAL #3   Title Pt will be able to stand for up to an hour with min pain increase   Status Achieved   PT LONG TERM GOAL #4   Title Pt will be able to do stairs without pain increase for community mobility   Status On-going   PT LONG TERM GOAL #5   Title Pt will continue running without pain increase normal distances   Status On-going               Plan - 01/13/15 1324    Clinical Impression Statement Lindsey [name] is doing better, was able to go to a  horseback riding retreat and not have an exacerbation of pain. She can do exercises in clinic without increase in pain. Has good understanding of concepts.  Goals in progress.    PT Next Visit Plan Reformer, progress stab   PT Home Exercise Plan gave quadruped today   Consulted and Agree with Plan of Care Patient        Problem List Patient Active Problem List   Diagnosis Date Noted  . Sacroiliac joint dysfunction of right side 11/26/2014  . Hypermobility syndrome 11/26/2014    PAA,JENNIFER 01/13/2015, 1:27 PM  [name] [address] [place], [place], [address] Phone: [phone]   Fax:  [phone]   Raeford Razor, PT 01/13/2015 1:28 PM Phone: [phone] Fax: [phone]

## 2015-01-13 NOTE — Patient Instructions (Signed)
Gave pt BIRD DOG (quadruped) and pt. felt she did not need a picture.

## 2015-01-15 ENCOUNTER — Encounter: Payer: Self-pay | Admitting: Sports Medicine

## 2015-01-15 ENCOUNTER — Ambulatory Visit (INDEPENDENT_AMBULATORY_CARE_PROVIDER_SITE_OTHER): Payer: 59 | Admitting: Sports Medicine

## 2015-01-15 VITALS — BP 123/70 | Ht 66.0 in | Wt 143.0 lb

## 2015-01-15 DIAGNOSIS — M357 Hypermobility syndrome: Secondary | ICD-10-CM

## 2015-01-15 DIAGNOSIS — M533 Sacrococcygeal disorders, not elsewhere classified: Secondary | ICD-10-CM

## 2015-01-15 MED ORDER — TRAMADOL HCL 50 MG PO TABS: ORAL_TABLET | ORAL | Status: DC

## 2015-01-15 NOTE — Addendum Note (Signed)
Addended by: Cyd Silence on: 01/15/2015 01:52 PM   Modules accepted: Orders
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
## 2015-01-15 NOTE — Assessment & Plan Note (Addendum)
Previous treatment including  SI joint injection 1 with discussion of SI joint fusion with Dr. Lynann Bologna  at [facility] with physical therapy and Pilates at this time. -Consideration of repeat SI joint injection in the future. -Continue with work restrictions as well as Neurontin 300 mg and tramadol when necessary.  Work restrictions include the following based on 2 evaluations and PT report: Work 2 days consecutively then 1 day off Walking no more than 4 hours cumulative of 8 hour shift/ patient reports that if she walks 30 mins or more continuously this triggers back pain No more than 2 hours sitting at any one time before she should change position   If these restrictions are not adequate, I think she would need a full occupational health evaluation to assess whether she can do her job tasks adequately without triggering the SI joint pain and Dysfunction.   I would recommend referral to occupational health for that assessment.  I will be happy to follow her for the hypermobility issues going forward.

## 2015-01-15 NOTE — Assessment & Plan Note (Signed)
I will want to continue follow up on q 3 mo basis if symptomatic

## 2015-01-16 ENCOUNTER — Ambulatory Visit: Payer: 59 | Admitting: Physical Therapy

## 2015-01-16 DIAGNOSIS — M545 Low back pain: Secondary | ICD-10-CM

## 2015-01-16 DIAGNOSIS — R29898 Other symptoms and signs involving the musculoskeletal system: Secondary | ICD-10-CM

## 2015-01-16 DIAGNOSIS — M62838 Other muscle spasm: Secondary | ICD-10-CM

## 2015-01-16 DIAGNOSIS — M532X8 Spinal instabilities, sacral and sacrococcygeal region: Secondary | ICD-10-CM

## 2015-01-16 DIAGNOSIS — Q796 Ehlers-Danlos syndrome, unspecified: Secondary | ICD-10-CM

## 2015-01-16 DIAGNOSIS — R293 Abnormal posture: Secondary | ICD-10-CM

## 2015-01-16 NOTE — Therapy (Signed)
[place], [place], [address] Phone: [phone]   Fax:  [phone]  Physical Therapy Treatment  Patient Details  Name: Lindsey [name] MRN: [id number] Date of Birth: 05/31/1965 Referring Provider:  Drenda Freeze, MD  Encounter Date: 01/16/2015      PT End of Session - 01/16/15 1217    Visit Number 6   Number of Visits 12   Date for PT Re-Evaluation 01/23/15   PT Start Time 1020   PT Stop Time 1055   PT Time Calculation (min) 35 min   Activity Tolerance Patient tolerated treatment well;Patient limited by pain      Past Medical History  Diagnosis Date  . Asthma     No past surgical history on file.  There were no vitals filed for this visit.  Visit Diagnosis:  Ehlers-Danlos syndrome  Instability of sacroiliac joint  Weakness of back  Bilateral low back pain, with sciatica presence unspecified  Abnormal posture  Muscle spasm      Subjective Assessment - 01/16/15 1025    Subjective Saw MD the other day, pain increased due to his exam.  Received online SI belt.  Needs an FCE   Currently in Pain? Yes   Pain Score 7            [facility] Adult PT Treatment/Exercise - 01/16/15 1228    Self-Care   Other Self-Care Comments  trainer, precautions, FCE   Ultrasound   Ultrasound Location Rt. SI border, gluteals,piriformis   Ultrasound Parameters 50%, 1.2 W/Cm2 and 1MHz   Ultrasound Goals Pain   Manual Therapy   Manual Therapy Soft tissue mobilization   Manual therapy comments Rt. SI border into gluteals and lower L paraspinals    Soft tissue mobilization mod pressure, used biofreeze                PT Education - 01/16/15 1217    Education provided Yes   Education Details FCE and procedures for testing    Person(s) Educated Patient   Methods Explanation   Comprehension Verbalized understanding          PT Short Term Goals - 01/13/15 1044    PT SHORT TERM GOAL #1   Title  She will be independent with inital HEP   Status Achieved   PT SHORT TERM GOAL #2   Title She will report pain decrease 25% or more with transitional movements (esp sitting)   Status On-going   PT SHORT TERM GOAL #3   Title Pt will be able to report less pain end of work day (25% less) with use of belt if needed   Status On-going   PT SHORT TERM GOAL #4   Title Pt will understand concepts of stability and posture as it relates to her joint condition.    Status Achieved           PT Long Term Goals - 01/13/15 1051    PT LONG TERM GOAL #1   Title independent with intial and all HEP issued as of last visit   Status On-going   PT LONG TERM GOAL #2   Title Pt  will report pain decreased 50% or more and able to drive to work with minimal pain increase   Status On-going   PT LONG TERM GOAL #3   Title Pt will be able to stand for up to an hour with min pain increase   Status Achieved   PT LONG TERM GOAL #  4   Title Pt will be able to do stairs without pain increase for community mobility   Status On-going   PT LONG TERM GOAL #5   Title Pt will continue running without pain increase normal distances   Status On-going               Plan - 01/16/15 1217    Clinical Impression Statement Patient flared up from ortho exam, states she asked the MD to not perform a certain manuever and he proceeded to do it.  She did not sleep well and pain meds took her pain down to a 7/10.  She requested modalities only.  Time spent today discussing FCE and scheduling, clarification of procedures.  Also advised to be very careful with  her personal trainer and use caution with lunges (unilateral motions)   PT Next Visit Plan return to exercises for stab. , consider McConnel tape to SIJ,    PT Home Exercise Plan cont with current    Consulted and Agree with Plan of Care Patient        Problem List Patient Active Problem List   Diagnosis Date Noted  . Sacroiliac joint dysfunction of right side  11/26/2014  . Hypermobility syndrome 11/26/2014    PAA,JENNIFER 01/16/2015, 12:30 PM  [facility] [address] [place], [place], [address] Phone: [phone]   Fax:  [phone]   Raeford Razor, PT 01/16/2015 12:32 PM Phone: [phone] Fax: [phone]

## 2015-01-20 ENCOUNTER — Ambulatory Visit: Payer: 59 | Admitting: Physical Therapy

## 2015-01-20 DIAGNOSIS — M545 Low back pain: Secondary | ICD-10-CM

## 2015-01-20 DIAGNOSIS — M532X8 Spinal instabilities, sacral and sacrococcygeal region: Secondary | ICD-10-CM

## 2015-01-20 DIAGNOSIS — R293 Abnormal posture: Secondary | ICD-10-CM

## 2015-01-20 DIAGNOSIS — Q796 Ehlers-Danlos syndrome, unspecified: Secondary | ICD-10-CM

## 2015-01-20 DIAGNOSIS — R29898 Other symptoms and signs involving the musculoskeletal system: Secondary | ICD-10-CM

## 2015-01-20 DIAGNOSIS — M62838 Other muscle spasm: Secondary | ICD-10-CM

## 2015-01-20 NOTE — Patient Instructions (Signed)
Pilates Reformer

## 2015-01-20 NOTE — Therapy (Signed)
[place], [place], [address] Phone: [phone]   Fax:  [phone]  Physical Therapy Treatment  Patient Details  Name: Lindsey [name] MRN: [id number] Date of Birth: [birth year] Referring Provider:  Drenda Freeze, MD  Encounter Date: 01/20/2015      PT End of Session - 01/20/15 1310    Visit Number 7   Number of Visits 12   Date for PT Re-Evaluation 01/23/15   PT Start Time 1025   PT Stop Time 1112   PT Time Calculation (min) 47 min   Activity Tolerance Patient tolerated treatment well      Past Medical History  Diagnosis Date  . Asthma     No past surgical history on file.  There were no vitals filed for this visit.  Visit Diagnosis:  Ehlers-Danlos syndrome  Instability of sacroiliac joint  Weakness of back  Bilateral low back pain, with sciatica presence unspecified  Abnormal posture  Muscle spasm      Subjective Assessment - 01/20/15 1038    Subjective Patient feels better today, close to baseline.  She will call occupational health to schedule.  New pillow, sleeping better.  Wears SI belt today.   Currently in Pain? Yes   Pain Score 3    Pain Location Sacrum   Pain Orientation Right   Pain Descriptors / Indicators Aching   Pain Type Chronic pain   Pain Onset More than a month ago   Pain Frequency Intermittent           [facility] Adult PT Treatment/Exercise - 01/20/15 0001    Manual Therapy   Manual Therapy Taping   McConnell X over SIJ for stability bringing tape proximally      .   Pilates Reformer used for LE/core strength, postural strength, lumbopelvic disassociation and core control.  Exercises included: Footwork emphasizing neutral in pelvis, patient initially felt 2 Red and 1 Blue too easy, but was unable to maintain neutral with heavier springs.   Worked with small ball for LE alignment and breathing patterns to activate deep stabilizing mm.  Single leg in supine  and in sidelying for hip stability.   Long box Prone:  UE overhead press 1 Red 2 sets x 10 cues for spinal length  Hip ext alternating   Ice in supine to low lumbar/SI 10 min      PT Education - 01/20/15 1310    Education provided Yes   Education Lawyer and neutral, Research scientist (physical sciences)) Educated Patient   Methods Explanation   Comprehension Verbalized understanding          PT Short Term Goals - 01/13/15 1044    PT SHORT TERM GOAL #1   Title She will be independent with inital HEP   Status Achieved   PT SHORT TERM GOAL #2   Title She will report pain decrease 25% or more with transitional movements (esp sitting)   Status On-going   PT SHORT TERM GOAL #3   Title Pt will be able to report less pain end of work day (25% less) with use of belt if needed   Status On-going   PT SHORT TERM GOAL #4   Title Pt will understand concepts of stability and posture as it relates to her joint condition.    Status Achieved           PT Long Term Goals - 01/13/15 1051    PT LONG TERM GOAL #1  Title independent with intial and all HEP issued as of last visit   Status On-going   PT LONG TERM GOAL #2   Title Pt  will report pain decreased 50% or more and able to drive to work with minimal pain increase   Status On-going   PT LONG TERM GOAL #3   Title Pt will be able to stand for up to an hour with min pain increase   Status Achieved   PT LONG TERM GOAL #4   Title Pt will be able to do stairs without pain increase for community mobility   Status On-going   PT LONG TERM GOAL #5   Title Pt will continue running without pain increase normal distances   Status On-going               Plan - 01/20/15 1311    Clinical Impression Statement Patient with less pain today, has not had any personal training sessions this weekend.  She reports feeling a "healing warmth" in low back and SIJ post session.  Trial of McConnell tape for SIJ stability.     PT Next  Visit Plan assess tape, cont stab   PT Home Exercise Plan cont with current    Consulted and Agree with Plan of Care Patient        Problem List Patient Active Problem List   Diagnosis Date Noted  . Sacroiliac joint dysfunction of right side 11/26/2014  . Hypermobility syndrome 11/26/2014    Kimmy Totten 01/20/2015, 1:20 PM  [facility] [address] [place], [place], [address] Phone: [phone]   Fax:  [phone]   Raeford Razor, PT 01/20/2015 1:24 PM Phone: [phone] Fax: [phone]

## 2015-01-23 ENCOUNTER — Ambulatory Visit: Payer: 59 | Admitting: Physical Therapy

## 2015-01-23 DIAGNOSIS — R29898 Other symptoms and signs involving the musculoskeletal system: Secondary | ICD-10-CM

## 2015-01-23 DIAGNOSIS — M545 Low back pain: Secondary | ICD-10-CM

## 2015-01-23 DIAGNOSIS — R293 Abnormal posture: Secondary | ICD-10-CM

## 2015-01-23 DIAGNOSIS — Q796 Ehlers-Danlos syndrome, unspecified: Secondary | ICD-10-CM

## 2015-01-23 DIAGNOSIS — M532X8 Spinal instabilities, sacral and sacrococcygeal region: Secondary | ICD-10-CM

## 2015-01-23 DIAGNOSIS — M62838 Other muscle spasm: Secondary | ICD-10-CM

## 2015-01-23 NOTE — Therapy (Signed)
[name], [place], [address] Phone: [phone]   Fax:  [phone]  Physical Therapy Treatment  Patient Details  Name: Lindsey [name] MRN: [id number] Date of Birth: Nov 13, 1965 No Data Recorded  Encounter Date: 01/23/2015      PT End of Session - 01/23/15 1028    Visit Number 8   Number of Visits 12   Date for PT Re-Evaluation 01/23/15   PT Start Time 1016   PT Stop Time 1110   PT Time Calculation (min) 54 min   Activity Tolerance Patient tolerated treatment well   Behavior During Therapy [facility] for tasks assessed/performed      Past Medical History  Diagnosis Date  . Asthma     No past surgical history on file.  There were no vitals filed for this visit.  Visit Diagnosis:  Ehlers-Danlos syndrome  Instability of sacroiliac joint  Bilateral low back pain, with sciatica presence unspecified  Weakness of back  Abnormal posture  Muscle spasm      Subjective Assessment - 01/23/15 1017    Subjective Went for a run last night with my brace, it hurt afterwards.  More of a walk/run.  Has a occ health appt.  Dr. Dannial Monarch (may order a FCE)   Currently in Pain? Yes   Pain Score 4   was 5/10 last night   Pain Location Sacrum   Pain Orientation Right   Pain Descriptors / Indicators Aching   Pain Type Chronic pain   Pain Onset More than a month ago   Pain Frequency Intermittent   Aggravating Factors  driving, jarring, walking    Pain Relieving Factors meds, SI belit            [facility] PT Assessment - 01/23/15 1059    Strength   Right Hip Extension 4+/5   Right Hip ABduction 4+/5   Left Hip Extension 4+/5   Left Hip ABduction 5/5   Right/Left Knee --  glute med 4/5       Pilates Tower for LE/Core strength, postural strength, lumbopelvic disassociation and core control.  Exercises included: Supine Leg [place] long yellow   Arcs parallel, ER and IR   Squat  Single leg small hip  circles and single leg arcs for pelvis stab.  (all small ROM and with caution)   Supine arms Arcs x 10 and circles x10   Rt. Shoulder comes off table, not strong enough  Sidelying scap and core work with manual cues to stabilize Rt. Scap.   Roll down series for abdominals  No pain post session.              [facility] Adult PT Treatment/Exercise - 01/23/15 1158    Lumbar Exercises: Aerobic   Stationary Bike L3 for 5 min for warm up   Cryotherapy   Number Minutes Cryotherapy 10 Minutes   Cryotherapy Location Lumbar Spine   Type of Cryotherapy Ice pack                PT Education - 01/23/15 1157    Education provided Yes   Education Details Pilates Tower   Person(s) Educated Patient   Methods Explanation;Demonstration;Verbal cues;Tactile cues   Comprehension Verbalized understanding;Returned demonstration          PT Short Term Goals - 01/23/15 1033    PT SHORT TERM GOAL #1   Title She will be independent with inital HEP   Status Achieved   PT SHORT TERM  GOAL #2   Title She will report pain decrease 25% or more with transitional movements (esp sitting)   Status Achieved   PT SHORT TERM GOAL #3   Title Pt will be able to report less pain end of work day (25% less) with use of belt if needed   Status On-going   PT SHORT TERM GOAL #4   Title Pt will understand concepts of stability and posture as it relates to her joint condition.    Status Achieved           PT Long Term Goals - 01/23/15 1034    PT LONG TERM GOAL #1   Title independent with intial and all HEP issued as of last visit   Status On-going   PT LONG TERM GOAL #2   Title Pt  will report pain decreased 50% or more and able to drive to work with minimal pain increase   Status On-going   PT LONG TERM GOAL #3   Title Pt will be able to stand for up to an hour with min pain increase   Status Achieved   PT LONG TERM GOAL #4   Title Pt will be able to do stairs without pain increase for community  mobility   Status On-going               Plan - 01/23/15 1159    Clinical Impression Statement Patient has been able to integrate stabilization principles into her daily activities.  She reports better abdominal awareness and using good form to lift, roll in bed and do home tasks.  She will be starting a new job. Will benefit from 4-6 more week of PT to see her through the transition and ensure consistent success with movements and activity..   PT Next Visit Plan assess tape, cont stabilization using mat and/or PIlates equipment   PT Home Exercise Plan cont with current    Consulted and Agree with Plan of Care Patient        Problem List Patient Active Problem List   Diagnosis Date Noted  . Sacroiliac joint dysfunction of right side 11/26/2014  . Hypermobility syndrome 11/26/2014    Lindsey [name] 01/23/2015, 12:02 PM  [facility] [address] [place], [place], [address] Phone: [phone]   Fax:  [phone]  Name: Lindsey [name] MRN: [id number] Date of Birth: Mar 27, 1966   Raeford Razor, PT 01/23/2015 12:03 PM Phone: [phone] Fax: [phone]

## 2015-01-27 ENCOUNTER — Ambulatory Visit: Payer: 59 | Admitting: Physical Therapy

## 2015-01-27 ENCOUNTER — Telehealth: Payer: Self-pay | Admitting: *Deleted

## 2015-01-27 DIAGNOSIS — R29898 Other symptoms and signs involving the musculoskeletal system: Secondary | ICD-10-CM

## 2015-01-27 DIAGNOSIS — Q796 Ehlers-Danlos syndrome, unspecified: Secondary | ICD-10-CM

## 2015-01-27 DIAGNOSIS — M62838 Other muscle spasm: Secondary | ICD-10-CM

## 2015-01-27 DIAGNOSIS — R293 Abnormal posture: Secondary | ICD-10-CM

## 2015-01-27 DIAGNOSIS — M545 Low back pain: Secondary | ICD-10-CM

## 2015-01-27 DIAGNOSIS — M532X8 Spinal instabilities, sacral and sacrococcygeal region: Secondary | ICD-10-CM

## 2015-01-27 NOTE — Telephone Encounter (Signed)
Talked with pt and told her that Dr Hunt's visit wasn't necessarily suppose to be for a 4 hours assessment; was to see if her work duties would aggrevate her current condition. Per the patient, she states "[employer] said her appt with Dr Geoffry Paradise would not suffice as an assessment; It needed to be a 4-hour type of assessment". Told pt to have [employer] to call us or Dr. Geoffry Paradise to ensure the type of assessment they are requesting. Pt agreed.

## 2015-01-27 NOTE — Therapy (Signed)
[place], [place], [address] Phone: [phone]   Fax:  [phone]  Physical Therapy Treatment  Patient Details  Name: Lindsey [name] MRN: [id number] Date of Birth: Jan 23, 1966 No Data Recorded  Encounter Date: 01/27/2015      PT End of Session - 01/27/15 1510    Visit Number 9   Number of Visits 20   Date for PT Re-Evaluation 03/06/15   PT Start Time 1505   PT Stop Time 1600   PT Time Calculation (min) 55 min   Activity Tolerance Patient tolerated treatment well   Behavior During Therapy --  emotional, when talking about weight gain      Past Medical History  Diagnosis Date  . Asthma     No past surgical history on file.  There were no vitals filed for this visit.  Visit Diagnosis:  Ehlers-Danlos syndrome  Instability of sacroiliac joint  Bilateral low back pain, with sciatica presence unspecified  Weakness of back  Abnormal posture  Muscle spasm      Subjective Assessment - 01/27/15 1505    Subjective Went to [facility] and they won't pay for FCE unless its WC. Sending a referral fro FCE for here. Ran 3 miles the other day.  Wore belt.  no sharp shooting pain, no burning but it did hurt at a 5/10. Then walked 7 miles yesterday.    Currently in Pain? Yes   Pain Score 4    Pain Location Sacrum   Pain Orientation Right   Pain Descriptors / Indicators Discomfort   Pain Type Chronic pain   Pain Onset More than a month ago   Pain Frequency Intermittent   Pain Relieving Factors meds and SI belt             [facility] Adult PT Treatment/Exercise - 01/27/15 1539    Self-Care   Other Self-Care Comments  practiced stairs (12) done without pain, without rails    Lumbar Exercises: Standing   Wall Slides 20 reps   Wall Slides Limitations squat 2 x 5lbs    Other Standing Lumbar Exercises Freemotion high row 3 plates, shoulder extensiion 2 plates, push-pull with abs braced 2 plates    Other Standing Lumbar Exercises split squat bicep curl 2 plates x 10 and front press x 10 1 plate   Lumbar Exercises: Prone   Other Prone Lumbar Exercises plank on elbows 15 sec and on hands x 10, worked on form    Other Prone Lumbar Exercises elevated coutnertop pushups offered an assist for pushups using a magic circle (on knees)    Shoulder Exercises: Standing   Other Standing Exercises reverse fly   Other Standing Exercises tricep extension    Cryotherapy   Number Minutes Cryotherapy 10 Minutes   Cryotherapy Location Lumbar Spine   Type of Cryotherapy Ice pack      Self care: Form, safe motions to avoid irritating SIJ, principles of stabilization.  Pt continues to say what she does with trainer and asks multiple questions about safety.   Pt starts a new job and so schedule may not allow much time for PT as it is in [employer].  Avoid free weight/dumbells if heavy due to lack of joint proprioception.             PT Short Term Goals - 01/27/15 1545    PT SHORT TERM GOAL #1   Title She will be independent with inital HEP   Status  Achieved   PT SHORT TERM GOAL #2   Title She will report pain decrease 25% or more with transitional movements (esp sitting)   Status Achieved   PT SHORT TERM GOAL #3   Title Pt will be able to report less pain end of work day (25% less) with use of belt if needed   Baseline has not worked, starts new job Monday   Status Unable to assess   PT SHORT TERM GOAL #4   Title Pt will understand concepts of stability and posture as it relates to her joint condition.    Status Achieved           PT Long Term Goals - 01/27/15 1546    PT LONG TERM GOAL #1   Title independent with intial and all HEP issued as of last visit   Status On-going   PT LONG TERM GOAL #2   Title Pt  will report pain decreased 50% or more and able to drive to work with minimal pain increase   Status On-going   PT LONG TERM GOAL #3   Title Pt will be able to stand for up to  an hour with min pain increase   Status Achieved   PT LONG TERM GOAL #4   Title Pt will be able to do stairs without pain increase for community mobility   Status Achieved   PT LONG TERM GOAL #5   Title Pt will continue running without pain increase normal distances   Status On-going               Plan - 01/27/15 1630    Clinical Impression Statement Patient with pain flare up due to risky exercise behaviors.  She ran and walked 7 miles in the past few days.  She contoinues to work out with [employer] and trainer, focusing on upper body mostly but also doing asymmetrical LE motions (split squat, lunge) which can flare up a SIJ.  She may be getting conflicting info from me and the trainer.  I advised she dial back her exercise, stick to squats (if she must) and focus on form over weights and reps.  She wants to lose weight and struggles with body image.  Again I emphasized a lifelong condition that she needs to make good decisions and be more forgiving of herself.     PT Next Visit Plan assess tape, cont stabilization using mat and/or PIlates equipment. Pt wants to be challenged, integrate weights into safe standing stab techiniques   PT Home Exercise Plan cont with current    Consulted and Agree with Plan of Care Patient        Problem List Patient Active Problem List   Diagnosis Date Noted  . Sacroiliac joint dysfunction of right side 11/26/2014  . Hypermobility syndrome 11/26/2014    PAA,JENNIFER 01/27/2015, 4:34 PM  [place] [place], [place], [address] Phone: [phone]   Fax:  [phone]  Name: TRANIYAH HALLETT MRN: [id number] Date of Birth: Jan 27, 1966  Raeford Razor, PT 01/27/2015 4:37 PM Phone: [phone] Fax: [phone]

## 2015-01-30 ENCOUNTER — Ambulatory Visit: Payer: 59 | Admitting: Physical Therapy

## 2015-01-30 DIAGNOSIS — R293 Abnormal posture: Secondary | ICD-10-CM

## 2015-01-30 DIAGNOSIS — Q796 Ehlers-Danlos syndrome, unspecified: Secondary | ICD-10-CM

## 2015-01-30 DIAGNOSIS — M532X8 Spinal instabilities, sacral and sacrococcygeal region: Secondary | ICD-10-CM

## 2015-01-30 DIAGNOSIS — R29898 Other symptoms and signs involving the musculoskeletal system: Secondary | ICD-10-CM

## 2015-01-30 DIAGNOSIS — M545 Low back pain: Secondary | ICD-10-CM

## 2015-01-30 DIAGNOSIS — M62838 Other muscle spasm: Secondary | ICD-10-CM

## 2015-01-30 NOTE — Therapy (Signed)
[place], [place], [address] Phone: [phone]   Fax:  [phone]  Physical Therapy Treatment  Patient Details  Name: DAQUISHA CLERMONT MRN: [id number] Date of Birth: 1965/09/06 No Data Recorded  Encounter Date: 01/30/2015      PT End of Session - 01/30/15 1121    Visit Number 10   Number of Visits 20   Date for PT Re-Evaluation 03/06/15   PT Start Time 1105   PT Stop Time 1204   PT Time Calculation (min) 59 min   Activity Tolerance Patient tolerated treatment well   Behavior During Therapy [facility] for tasks assessed/performed      Past Medical History  Diagnosis Date  . Asthma     No past surgical history on file.  There were no vitals filed for this visit.  Visit Diagnosis:  Ehlers-Danlos syndrome  Instability of sacroiliac joint  Bilateral low back pain, with sciatica presence unspecified  Weakness of back  Abnormal posture  Muscle spasm      Subjective Assessment - 01/30/15 1105    Subjective Pt wants to get a plan for continuing exercise.  May consider group community classes.  Has just been walking with belt.    Currently in Pain? Yes   Pain Score 3    Pain Location Sacrum   Pain Orientation Right   Pain Descriptors / Indicators Discomfort   Pain Type Chronic pain   Pain Onset More than a month ago   Pain Frequency Intermittent            [facility] Adult PT Treatment/Exercise - 01/30/15 1119    Self-Care   Other Self-Care Comments  post rehab Pilates   Cryotherapy   Number Minutes Cryotherapy 10 Minutes   Cryotherapy Location Lumbar Spine   Type of Cryotherapy Ice pack      Pilates Reformer used for LE/core strength, postural strength, lumbopelvic disassociation and core control.  Exercises included: Footwork 2 Red 1 Green heels, forefoot, parallel and 1st position Bridging  2 red  1 Green with ball added knee/hip ext  Supine Arm work 1 Red 1 Yellow  Arcs, T x 10 add  chest lift to each of these.  Supine Abs see above only able to do 3 in a row Long box Prone pulling straps red x 10  Triceps 1 red x 10   Long box side balance and added UE adduction to add instability Side kick series (top leg flex/ext)       PT Education - 01/30/15 1201    Education provided Yes   Education Details community class   Person(s) Educated Patient   Methods Explanation   Comprehension Verbalized understanding          PT Short Term Goals - 01/27/15 1545    PT SHORT TERM GOAL #1   Title She will be independent with inital HEP   Status Achieved   PT SHORT TERM GOAL #2   Title She will report pain decrease 25% or more with transitional movements (esp sitting)   Status Achieved   PT SHORT TERM GOAL #3   Title Pt will be able to report less pain end of work day (25% less) with use of belt if needed   Baseline has not worked, starts new job Monday   Status Unable to assess   PT SHORT TERM GOAL #4   Title Pt will understand concepts of stability and posture as it relates to her joint condition.  Status Achieved           PT Long Term Goals - 01/27/15 1546    PT LONG TERM GOAL #1   Title independent with intial and all HEP issued as of last visit   Status On-going   PT LONG TERM GOAL #2   Title Pt  will report pain decreased 50% or more and able to drive to work with minimal pain increase   Status On-going   PT LONG TERM GOAL #3   Title Pt will be able to stand for up to an hour with min pain increase   Status Achieved   PT LONG TERM GOAL #4   Title Pt will be able to do stairs without pain increase for community mobility   Status Achieved   PT LONG TERM GOAL #5   Title Pt will continue running without pain increase normal distances   Status On-going               Plan - 01/30/15 1202    Clinical Impression Statement Patient appreciative of last session, agreeable to "tone down" her exercise routine.  Starts new job on Monday, unsure of  hours.  Will cont to progress her and ensure form with exercises.     PT Next Visit Plan re- tape, cont stabilization using mat and/or PIlates equipment. Pt wants to be challenged, integrate weights into safe standing stab techiniques   PT Home Exercise Plan cont with current    Consulted and Agree with Plan of Care Patient        Problem List Patient Active Problem List   Diagnosis Date Noted  . Sacroiliac joint dysfunction of right side 11/26/2014  . Hypermobility syndrome 11/26/2014    PAA,JENNIFER 01/30/2015, 12:08 PM  [facility] [address] [place], [place], [address] Phone: [phone]   Fax:  [phone]  Name: JORDI KAMM MRN: [id number] Date of Birth: Apr 21, 1965  Raeford Razor, PT 01/30/2015 12:09 PM Phone: [phone] Fax: [phone]

## 2015-02-03 ENCOUNTER — Encounter: Payer: 59 | Admitting: Physical Therapy

## 2015-02-05 ENCOUNTER — Encounter: Payer: 59 | Admitting: Physical Therapy

## 2015-02-10 ENCOUNTER — Encounter: Payer: 59 | Admitting: Physical Therapy

## 2015-02-12 ENCOUNTER — Encounter: Payer: 59 | Admitting: Physical Therapy

## 2015-02-16 ENCOUNTER — Encounter: Payer: 59 | Admitting: Physical Therapy

## 2015-02-17 ENCOUNTER — Encounter: Payer: 59 | Admitting: Physical Therapy

## 2015-02-19 ENCOUNTER — Encounter: Payer: 59 | Admitting: Physical Therapy

## 2015-02-24 ENCOUNTER — Encounter: Payer: 59 | Admitting: Physical Therapy

## 2015-02-25 ENCOUNTER — Encounter: Payer: 59 | Admitting: Physical Therapy

## 2015-02-25 NOTE — Telephone Encounter (Signed)
Finished

## 2015-02-26 ENCOUNTER — Encounter: Payer: 59 | Admitting: Physical Therapy

## 2015-03-02 ENCOUNTER — Encounter: Payer: 59 | Admitting: Physical Therapy

## 2015-03-03 ENCOUNTER — Encounter: Payer: Self-pay | Admitting: Sports Medicine

## 2015-03-03 ENCOUNTER — Encounter: Payer: 59 | Admitting: Physical Therapy

## 2015-03-03 ENCOUNTER — Ambulatory Visit (INDEPENDENT_AMBULATORY_CARE_PROVIDER_SITE_OTHER): Payer: 59 | Admitting: Sports Medicine

## 2015-03-03 VITALS — BP 120/71 | HR 78 | Ht 66.0 in | Wt 143.0 lb

## 2015-03-03 DIAGNOSIS — M533 Sacrococcygeal disorders, not elsewhere classified: Secondary | ICD-10-CM | POA: Diagnosis not present

## 2015-03-03 DIAGNOSIS — M357 Hypermobility syndrome: Secondary | ICD-10-CM | POA: Diagnosis not present

## 2015-03-03 NOTE — Assessment & Plan Note (Signed)
This has become an issue with her work  I sent her to occupational medicine but to date I am not sure how to have her tested to see if she can do the activities necessary for her job and sent a letter to occupational health to try to clarify.  HR wanted her to have full PT work assessment as I suggested.  I wrote a letter today suggesting she have this done before returning to work.  Work restrictions I suggested before and sent to [employer] were: Walking no more than 4 hours of 8 hour day and no more than 1 hour continuously Working 2 days and then 1 day off to pursue PT and rest. These were temporary accomodations and she needs evaluation by other specialists if they are not adequate.  These seemed like best options to me along with ongoing PT.

## 2015-03-03 NOTE — Assessment & Plan Note (Signed)
Based on evaluations from her PT appointments with Casimiro Needle I believe Pilates would help her function better and learn to control joint position  I would recommend keeping up PT at least twice weekly

## 2015-03-03 NOTE — Progress Notes (Signed)
Patient ID: Lindsey [name], female   DOB: February 07, 1966, 49 y.o.   MRN: [id number]  Patient in PT using pilates to try to control her rt SI joint pain and instability. Dr Lynann Bologna had advised trying to ablate or fuse SI joint. We have been trying to give her a PT program to allow her to work and function.  She uses a lumbar brace which helps a lot.  PT is helpful but pain returns with sitting.  Patient has stopped walking and running as extended periods > 15 mins aggravate the low back pain.  Short activity in 10 mins spells does not seem to make the SI joint painful.  Soc Hx:  She is having difficulty with work 2/2 issues about she can effectively do without worsening her symptoms  ROS Hx of UTI in Jan and had 3 to 4 bouts Treated with fluids and cranberry juice not ABX Menopausal since Jan this year No fever or chills  Sleep position worsened LBP until she changed pillows  Tingling in hands and feet  Physical examination No acute distress BP 120/71 mmHg  Pulse 78  Ht 5\' 6"  (1.676 m)  Wt 143 lb (64.864 kg)  BMI 23.09 kg/m2  SLR is normal Pelvis position is neutral while in SI brace Heel walk normal Toe walk normal Neuro testing shows good balance and no changes  FABER causes pain over RT SIJ Left FABER is slightly limited  Strength is improved on abduction of hip but still cautious 2/2 pain on RT Hip flexion strength is normal Quad strength normal

## 2015-03-04 NOTE — Addendum Note (Signed)
Addended by: Cyd Silence on: 03/04/2015 09:53 AM   Modules accepted: Orders

## 2015-03-07 ENCOUNTER — Encounter: Payer: Self-pay | Admitting: Sports Medicine

## 2015-03-09 ENCOUNTER — Telehealth: Payer: Self-pay | Admitting: *Deleted

## 2015-03-09 ENCOUNTER — Telehealth: Payer: Self-pay | Admitting: Physical Therapy

## 2015-03-09 NOTE — Telephone Encounter (Signed)
Pt can call back with questions

## 2015-03-09 NOTE — Telephone Encounter (Signed)
Left message for patient to call back regarding her functional capacity appt

## 2015-03-09 NOTE — Telephone Encounter (Signed)
Wednesday 03/04/15 - called and left msg for pt to return call regarding FCE referral from Dr. Andreas Blower Monday 03/09/15 - called and left msg for pt to return call

## 2015-03-10 ENCOUNTER — Encounter: Payer: 59 | Admitting: Physical Therapy

## 2015-03-10 NOTE — Telephone Encounter (Signed)
Patient called back today and confirmed FCE scheduled for tomorrow at 8 AM.

## 2015-03-10 NOTE — Telephone Encounter (Signed)
Patient returned call (left message) with the office yesterday. I returned call yesterday and today and had to leave messaged both times.

## 2015-03-11 ENCOUNTER — Ambulatory Visit: Payer: 59 | Attending: Sports Medicine

## 2015-03-11 DIAGNOSIS — Q796 Ehlers-Danlos syndrome, unspecified: Secondary | ICD-10-CM

## 2015-03-11 DIAGNOSIS — M533 Sacrococcygeal disorders, not elsewhere classified: Secondary | ICD-10-CM | POA: Insufficient documentation

## 2015-03-11 DIAGNOSIS — M532X8 Spinal instabilities, sacral and sacrococcygeal region: Secondary | ICD-10-CM

## 2015-03-11 DIAGNOSIS — R293 Abnormal posture: Secondary | ICD-10-CM | POA: Insufficient documentation

## 2015-03-11 NOTE — Therapy (Addendum)
[place], [place], [address] Phone: [phone]   Fax:  [phone]  Physical Therapy Evaluation  Patient Details  Name: Lindsey [name] MRN: [id number] Date of Birth: 04-06-1966 Referring Provider: Stefanie Libel, MD  Encounter Date: 03/11/2015      PT End of Session - 03/11/15 1308    Visit Number 11   Number of Visits 20   PT Start Time 0806   PT Stop Time 1155   PT Time Calculation (min) 229 min   Activity Tolerance Patient tolerated treatment well   Behavior During Therapy [facility] for tasks assessed/performed      Past Medical History  Diagnosis Date  . Asthma     No past surgical history on file.  There were no vitals filed for this visit.  Visit Diagnosis:  Ehlers-Danlos syndrome - Plan: PT plan of care cert/re-cert  Instability of sacroiliac joint - Plan: PT plan of care cert/re-cert  Abnormal posture - Plan: PT plan of care cert/re-cert  Back pain, sacroiliac - Plan: PT plan of care cert/re-cert      Subjective Assessment - 03/11/15 1306    Subjective See FCE scannned into EPIC   Patient is accompained by: Family member            [facility] PT Assessment - 03/11/15 1307    Assessment   Referring Provider Stefanie Libel, MD   Prior Therapy yes, PT and chiro   Precautions   Precautions None   Restrictions   Weight Bearing Restrictions No      FCE report to be faxed to Dr Oneida Alar                       PT Short Term Goals - 01/27/15 1545    PT SHORT TERM GOAL #1   Title She will be independent with inital HEP   Status Achieved   PT SHORT TERM GOAL #2   Title She will report pain decrease 25% or more with transitional movements (esp sitting)   Status Achieved   PT SHORT TERM GOAL #3   Title Pt will be able to report less pain end of work day (25% less) with use of belt if needed   Baseline has not worked, starts new job Monday   Status Unable to assess   PT  SHORT TERM GOAL #4   Title Pt will understand concepts of stability and posture as it relates to her joint condition.    Status Achieved           PT Long Term Goals - 01/27/15 1546    PT LONG TERM GOAL #1   Title independent with intial and all HEP issued as of last visit   Status On-going   PT LONG TERM GOAL #2   Title Pt  will report pain decreased 50% or more and able to drive to work with minimal pain increase   Status On-going   PT LONG TERM GOAL #3   Title Pt will be able to stand for up to an hour with min pain increase   Status Achieved   PT LONG TERM GOAL #4   Title Pt will be able to do stairs without pain increase for community mobility   Status Achieved   PT LONG TERM GOAL #5   Title Pt will continue running without pain increase normal distances   Status On-going  Plan - 03/11/15 1310    Clinical Impression Statement Ms Serena completed the FCE and was rated for work at light level. See FCE report for details.    PT Next Visit Plan She will contact Dr Oneida Alar for follow up related to FCE   Consulted and Agree with Plan of Care Patient         Problem List Patient Active Problem List   Diagnosis Date Noted  . Sacroiliac joint dysfunction of right side 11/26/2014  . Hypermobility syndrome 11/26/2014    Darrel Hoover PT 03/11/2015, 1:16 PM  [facility] [address] [place], [place], [address] Phone: [phone]   Fax:  [phone]  Name: Lindsey [name] MRN: [id number] Date of Birth: Dec 24, 1965   PHYSICAL THERAPY DISCHARGE SUMMARY  Visits from Start of Care: FCE 1 vist  Current functional level related to goals / functional outcomes: NA.  See report  Remaining deficits: See report   Education / Equipment: NA Plan: Patient agrees to discharge.  Patient goals were not met. Patient is being discharged due to                                                     ?????     Completing the FCE.           Lillette Boxer Lindsey [name]   PT  02/29/16  11:38 AM

## 2015-03-11 NOTE — Patient Instructions (Signed)
She was asked to stay active with no increased activity or strenuous activity for 2-3 days, Meds and ice /rest as needed.

## 2015-03-12 ENCOUNTER — Encounter: Payer: 59 | Admitting: Physical Therapy

## 2015-03-15 ENCOUNTER — Encounter: Payer: Self-pay | Admitting: Sports Medicine

## 2015-03-17 ENCOUNTER — Encounter: Payer: 59 | Admitting: Physical Therapy

## 2015-03-17 ENCOUNTER — Encounter: Payer: Self-pay | Admitting: *Deleted

## 2015-03-19 ENCOUNTER — Encounter: Payer: 59 | Admitting: Physical Therapy

## 2015-03-24 ENCOUNTER — Ambulatory Visit: Payer: 59 | Admitting: Physical Therapy

## 2015-03-24 ENCOUNTER — Encounter: Payer: Self-pay | Admitting: Sports Medicine

## 2015-03-24 ENCOUNTER — Encounter: Payer: 59 | Admitting: Physical Therapy

## 2015-03-24 ENCOUNTER — Ambulatory Visit (INDEPENDENT_AMBULATORY_CARE_PROVIDER_SITE_OTHER): Payer: 59 | Admitting: Sports Medicine

## 2015-03-24 VITALS — BP 110/64 | Ht 66.0 in | Wt 143.0 lb

## 2015-03-24 DIAGNOSIS — M357 Hypermobility syndrome: Secondary | ICD-10-CM | POA: Diagnosis not present

## 2015-03-24 DIAGNOSIS — M533 Sacrococcygeal disorders, not elsewhere classified: Secondary | ICD-10-CM

## 2015-03-24 NOTE — Progress Notes (Signed)
Patient ID: Lindsey [name], female   DOB: 10-10-1965, 49 y.o.   MRN: [id number]  Patient comes in today to discuss her work status. She is a patient of Dr.Fields and has a history of hypermobility syndrome and SI joint dysfunction. I am seeing her in Dr.Fields' absence today. According to Dr.Fields' last office note on November 22 he placed work restrictions on the patient which included walking no more than 4 hours of a regular 8 hour day and no more than one hour continuously. He also requested that the patient be allowed to work 2 days and then have one day off to pursue PT and rest. The patient tells me that her employer [employer]) has been unable to honor those requests and as a result she has been out of work since mid September. 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 I've also reviewed her recent functional capacity evaluation which places her overall level of work as light work. Through all of this the patient has been very compliant with her physical therapy in an effort to try to stabilize her SI joints.  At this point I can only reiterate Dr Oneida Alar' previous request for work restrictions which include walking no more than 4 hours of a regular 8 hour day and no more than 1 hour continuously, as well as his request that the patient be allowed to work 2 days and then have one day off. If these accommodations can be met by her employer then I do not see the need for any further periods of extended absence. However, if these accommodations cannot be met, then the patient will need to remain out of work for at least another 3 months so that she may continue to focus on her physical therapy without suffering undue setbacks in her work environment.  Patient will  follow-up with Dr.Fields in 3 months.  Total time spent with the patient discussing this was 15 minutes.

## 2015-03-26 ENCOUNTER — Encounter: Payer: 59 | Admitting: Physical Therapy

## 2015-03-27 ENCOUNTER — Telehealth: Payer: Self-pay | Admitting: Sports Medicine

## 2015-03-27 NOTE — Telephone Encounter (Signed)
I spoke with Dr.Rubin on the phone today regarding Lindsey [name] work restrictions. It is his opinion based on the FCE that the patient is able to work a complete 8 hour shift with positional changes approximately every 10 minutes. In reviewing the FCE it does appear that the patient should be able to do this. Therefore I will change my initial request from a 4 hour workday limitation to a complete 8 hour shift but with the requirement that she be allowed frequent sitting, standing, and walking positional changes throughout that workday. Again, the FCE supports this change. The other limitation limiting her to 2 straight days of work with one day off will remain in effect.

## 2015-03-31 ENCOUNTER — Encounter: Payer: 59 | Admitting: Physical Therapy

## 2015-03-31 ENCOUNTER — Encounter: Payer: Self-pay | Admitting: Sports Medicine

## 2015-04-02 ENCOUNTER — Encounter: Payer: 59 | Admitting: Physical Therapy

## 2015-04-07 ENCOUNTER — Encounter: Payer: 59 | Admitting: Physical Therapy

## 2015-04-15 ENCOUNTER — Ambulatory Visit (INDEPENDENT_AMBULATORY_CARE_PROVIDER_SITE_OTHER): Payer: 59 | Admitting: Urgent Care

## 2015-04-15 VITALS — BP 102/70 | HR 82 | Temp 98.8°F | Resp 16 | Ht 66.0 in | Wt 139.2 lb

## 2015-04-15 DIAGNOSIS — R05 Cough: Secondary | ICD-10-CM | POA: Diagnosis not present

## 2015-04-15 DIAGNOSIS — J029 Acute pharyngitis, unspecified: Secondary | ICD-10-CM | POA: Diagnosis not present

## 2015-04-15 DIAGNOSIS — R059 Cough, unspecified: Secondary | ICD-10-CM

## 2015-04-15 DIAGNOSIS — J22 Unspecified acute lower respiratory infection: Secondary | ICD-10-CM

## 2015-04-15 DIAGNOSIS — J988 Other specified respiratory disorders: Secondary | ICD-10-CM

## 2015-04-15 MED ORDER — AZITHROMYCIN 250 MG PO TABS: ORAL_TABLET | ORAL | Status: DC

## 2015-04-15 MED ORDER — BENZONATATE 100 MG PO CAPS: 100.0000 mg | ORAL_CAPSULE | Freq: Three times a day (TID) | ORAL | Status: DC | PRN

## 2015-04-15 MED ORDER — HYDROCODONE-HOMATROPINE 5-1.5 MG/5ML PO SYRP: 5.0000 mL | ORAL_SOLUTION | Freq: Every evening | ORAL | Status: DC | PRN

## 2015-04-15 MED FILL — BENZONATATE 100 MG CAPSULE: 100 | 7 days supply | Qty: 40 | Fill #0

## 2015-04-15 MED FILL — AZITHROMYCIN 250 MG TABLET: 250 | 5 days supply | Qty: 6 | Fill #0

## 2015-04-15 MED FILL — HYDROCODONE-HOMATROPINE SYR: 5-1.5 | 24 days supply | Qty: 120 | Fill #0

## 2015-04-15 NOTE — Patient Instructions (Signed)

## 2015-04-15 NOTE — Progress Notes (Signed)
    MRN: [id number] DOB: 1965-05-21  Subjective:   Lindsey [name] is a 50 y.o. female presenting for chief complaint of Cough and Sore Throat  Reports ~2 week history of dry cough that elicits chest pain worst at night, sore throat, lymph node pain, subjective fever, congestion. Symptoms actually started as a cold ~4 weeks ago and got better after a couple of weeks but developed cough symptoms shortly thereafter. Admits history of seasonal allergies, denies history of asthma. Has tried Delsym, Tylenol relief. Patient works at [employer] and has plenty of sick contacts. Denies fever, chest tightness, shob, wheezing, n/v, abdominal pain. Denies smoking cigarettes.  Lindsey [name] has a current medication list which includes the following prescription(s): acetaminophen, albuterol sulfate, gabapentin, hydrocodone-acetaminophen, hyoscyamine, meloxicam, montelukast, tizanidine, tramadol, tramadol, and trazodone. Also is allergic to penicillins and sulfa antibiotics.  Lindsey [name]  has a past medical history of Asthma. Also  has no past surgical history on file.  Objective:   Vitals: BP 102/70 mmHg  Pulse 82  Temp(Src) 98.8 F (37.1 C) (Oral)  Resp 16  Ht 5\' 6"  (1.676 m)  Wt 139 lb 3.2 oz (63.141 kg)  BMI 22.48 kg/m2  SpO2 98%  Physical Exam  Constitutional: She is oriented to person, place, and time. She appears well-developed and well-nourished.  HENT:  TM's intact bilaterally, no effusions or erythema. Nasal turbinates pink and moist, nasal passages patent. No sinus tenderness. Oropharynx clear, mucous membranes moist, dentition in good repair.  Eyes: No scleral icterus.  Neck: Normal range of motion. Neck supple.  Cardiovascular: Normal rate, regular rhythm and intact distal pulses.   Pulmonary/Chest: No respiratory distress. She has no wheezes. She has rales (coarse lung sounds in lower lung bases).  Lymphadenopathy:    She has cervical adenopathy (bilateral, anterior).  Neurological: She is  alert and oriented to person, place, and time.  Skin: Skin is warm and dry. No rash noted. No erythema. No pallor.   Assessment and Plan :   1. Lower respiratory infection (e.g., bronchitis, pneumonia, pneumonitis, pulmonitis) 2. Cough 3. Sore throat - Likely viral in etiology, recommended supportive care. Provided with Tessalon and Hycodan for cough. Patient is to fill script for Azithromycin in 4-5 days if no improvement with supportive care. Patient agreed. RTC if not better in 1 week.  Jaynee Eagles, PA-C [facility] [phone] 04/15/2015 8:52 AM

## 2015-04-17 ENCOUNTER — Ambulatory Visit (INDEPENDENT_AMBULATORY_CARE_PROVIDER_SITE_OTHER): Payer: 59 | Admitting: Physician Assistant

## 2015-04-17 VITALS — BP 120/74 | HR 92 | Temp 98.0°F | Resp 16 | Ht 66.0 in | Wt 139.0 lb

## 2015-04-17 DIAGNOSIS — R05 Cough: Secondary | ICD-10-CM

## 2015-04-17 DIAGNOSIS — R059 Cough, unspecified: Secondary | ICD-10-CM

## 2015-04-17 MED ORDER — HYDROCOD POLST-CPM POLST ER 10-8 MG/5ML PO SUER: 5.0000 mL | Freq: Two times a day (BID) | ORAL | Status: DC | PRN

## 2015-04-17 MED ORDER — MUCINEX DM MAXIMUM STRENGTH 60-1200 MG PO TB12: 1.0000 | ORAL_TABLET | Freq: Two times a day (BID) | ORAL | Status: DC

## 2015-04-17 MED FILL — HYDROCODONE-CHLORPHENIRAM S: 10-8 | 7 days supply | Qty: 70 | Fill #0

## 2015-04-17 NOTE — Patient Instructions (Signed)
Humidity in the home Nasal saline to help with moisture in the nose

## 2015-04-17 NOTE — Progress Notes (Signed)
Lindsey [name]  MRN: [id number] DOB: [birth year]  Subjective:  Pt presents to clinic for recheck cold symptoms.  She has been overall sick for about 6 weeks - she was sick for a month and then she was getting better and then 2 weeks ago she started getting sick again with nasal congestion without PND and without rhinorrhea and a cough that is dry and seems to be like her asthma cough.  She was seen 4 days ago and was advised to hold zpack until 3-4 days but she decided to take it right away and she has taken 4 days of the medication and she is not better and that worries her.  The hycodan is helping her cough but it only lasts about 4 hours and is has increased her dose and she is still having problems.  She is using her albuterol inhaler and that has not helped that much with the cough but it does help with the chest tightness.  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  Tylenol Tessalon perles and hycodan helps about 4 hours - waking up with the cough at night  Patient Active Problem List   Diagnosis Date Noted  . Sacroiliac joint dysfunction of right side 11/26/2014  . Hypermobility syndrome 11/26/2014    Current Outpatient Prescriptions on File Prior to Visit  Medication Sig Dispense Refill  . Acetaminophen (TYLENOL ARTHRITIS PAIN PO) Take 2 capsules by mouth daily.     . Albuterol Sulfate 108 (90 BASE) MCG/ACT AEPB Inhale into the lungs.    Marland Kitchen azithromycin (ZITHROMAX) 250 MG tablet Start with 2 tablets today, then 1 daily thereafter. 6 tablet 0  . benzonatate (TESSALON) 100 MG capsule Take 1-2 capsules (100-200 mg total) by mouth 3 (three) times daily as needed for cough. 40 capsule 0  . gabapentin (NEURONTIN) 300 MG capsule Take 1 capsule (300 mg total) by mouth at bedtime. 30 capsule 3  . HYDROcodone-homatropine (HYCODAN) 5-1.5 MG/5ML syrup Take 5 mLs by mouth at bedtime as needed. 120 mL 0  . hyoscyamine (LEVBID) 0.375 MG 12  hr tablet Take 0.375 mg by mouth 2 (two) times daily.    . meloxicam (MOBIC) 15 MG tablet   3  . montelukast (SINGULAIR) 10 MG tablet Take 10 mg by mouth at bedtime.    . traZODone (DESYREL) 50 MG tablet   1   No current facility-administered medications on file prior to visit.    Allergies  Allergen Reactions  . Penicillins   . Sulfa Antibiotics     Review of Systems  Constitutional: Positive for chills. Fever: subjective.  HENT: Positive for congestion and ear pain. Negative for postnasal drip and rhinorrhea.   Respiratory: Positive for cough (dry), shortness of breath and wheezing.   Gastrointestinal: Negative for nausea, vomiting and diarrhea.  Neurological: Negative for headaches.   Objective:  BP 120/74 mmHg  Pulse 92  Temp(Src) 98 F (36.7 C) (Oral)  Resp 16  Ht 5\' 6"  (1.676 m)  Wt 139 lb (63.05 kg)  BMI 22.45 kg/m2  SpO2 98%  Physical Exam  Constitutional: She is oriented to person, place, and time and well-developed, well-nourished, and in no distress.  HENT:  Head: Normocephalic and atraumatic.  Right Ear: Hearing, tympanic membrane, external ear and ear canal normal.  Left Ear: Hearing, tympanic membrane, external ear and ear canal normal.  Nose: Nose normal.  Mouth/Throat: Uvula is midline, oropharynx is clear  and moist and mucous membranes are normal.  Eyes: Conjunctivae are normal.  Neck: Normal range of motion.  Cardiovascular: Normal rate, regular rhythm and normal heart sounds.   No murmur heard. Pulmonary/Chest: Effort normal. She has wheezes (no wheezing with forced expiration).  Neurological: She is alert and oriented to person, place, and time. Gait normal.  Skin: Skin is warm and dry.  Psychiatric: Mood, memory, affect and judgment normal.  Vitals reviewed.   Assessment and Plan :  Cough - Plan: chlorpheniramine-HYDROcodone (TUSSIONEX PENNKINETIC ER) 10-8 MG/5ML SUER, Dextromethorphan-Guaifenesin (MUCINEX DM MAXIMUM STRENGTH) 60-1200 MG TB12,  Care order/instruction  I expect this to be viral still esp since she is not improving - she will continue her albuterol treatments as needed and we will change her cough medication to tussionex which should last longer.  If she is not improving in her cough by Monday we will start prednisone 60-10mg  taper.  She will push fluids and use humidifier at home.  Windell Hummingbird PA-C  [facility] 04/17/2015 5:15 PM

## 2015-04-20 DIAGNOSIS — Z6822 Body mass index (BMI) 22.0-22.9, adult: Secondary | ICD-10-CM | POA: Diagnosis not present

## 2015-04-20 DIAGNOSIS — M533 Sacrococcygeal disorders, not elsewhere classified: Secondary | ICD-10-CM | POA: Diagnosis not present

## 2015-04-20 DIAGNOSIS — J069 Acute upper respiratory infection, unspecified: Secondary | ICD-10-CM | POA: Diagnosis not present

## 2015-04-20 DIAGNOSIS — Z1389 Encounter for screening for other disorder: Secondary | ICD-10-CM | POA: Diagnosis not present

## 2015-04-20 DIAGNOSIS — Q796 Ehlers-Danlos syndrome: Secondary | ICD-10-CM | POA: Diagnosis not present

## 2015-04-20 MED FILL — traMADol HCL 50 MG TABS: 50 | 30 days supply | Qty: 180 | Fill #2

## 2015-04-23 ENCOUNTER — Ambulatory Visit: Payer: 59 | Attending: Sports Medicine | Admitting: Physical Therapy

## 2015-04-23 DIAGNOSIS — M533 Sacrococcygeal disorders, not elsewhere classified: Secondary | ICD-10-CM | POA: Insufficient documentation

## 2015-04-23 DIAGNOSIS — Q796 Ehlers-Danlos syndrome: Secondary | ICD-10-CM | POA: Insufficient documentation

## 2015-04-23 DIAGNOSIS — M6281 Muscle weakness (generalized): Secondary | ICD-10-CM | POA: Insufficient documentation

## 2015-04-23 DIAGNOSIS — M545 Low back pain: Secondary | ICD-10-CM | POA: Insufficient documentation

## 2015-04-23 DIAGNOSIS — M62838 Other muscle spasm: Secondary | ICD-10-CM | POA: Insufficient documentation

## 2015-04-23 DIAGNOSIS — R293 Abnormal posture: Secondary | ICD-10-CM | POA: Insufficient documentation

## 2015-04-28 ENCOUNTER — Ambulatory Visit: Payer: 59 | Admitting: Physical Therapy

## 2015-04-28 DIAGNOSIS — M545 Low back pain: Secondary | ICD-10-CM

## 2015-04-28 DIAGNOSIS — R29898 Other symptoms and signs involving the musculoskeletal system: Secondary | ICD-10-CM

## 2015-04-28 DIAGNOSIS — M533 Sacrococcygeal disorders, not elsewhere classified: Secondary | ICD-10-CM

## 2015-04-28 DIAGNOSIS — M532X8 Spinal instabilities, sacral and sacrococcygeal region: Secondary | ICD-10-CM

## 2015-04-28 DIAGNOSIS — R293 Abnormal posture: Secondary | ICD-10-CM

## 2015-04-28 DIAGNOSIS — Q796 Ehlers-Danlos syndrome, unspecified: Secondary | ICD-10-CM

## 2015-04-28 DIAGNOSIS — M62838 Other muscle spasm: Secondary | ICD-10-CM | POA: Diagnosis not present

## 2015-04-28 DIAGNOSIS — M6281 Muscle weakness (generalized): Secondary | ICD-10-CM | POA: Diagnosis not present

## 2015-04-28 NOTE — Therapy (Signed)
[facility] [address] [place], [place], [address] Phone: [phone]   Fax:  [phone]  Physical Therapy Evaluation  Patient Details  Name: Lindsey [name] MRN: [id number] Date of Birth: [birth year] Referring Provider: Dr. Oneida Alar  Encounter Date: 04/28/2015      PT End of Session - 04/28/15 1716    Visit Number 1   Number of Visits 16   Date for PT Re-Evaluation 06/23/15   Authorization Type [facility] UMR   PT Start Time 1015   PT Stop Time 1100   PT Time Calculation (min) 45 min   Activity Tolerance Patient tolerated treatment well      Past Medical History  Diagnosis Date  . Asthma     No past surgical history on file.  There were no vitals filed for this visit.  Visit Diagnosis:  Ehlers-Danlos syndrome - Plan: PT plan of care cert/re-cert  Instability of sacroiliac joint - Plan: PT plan of care cert/re-cert  Abnormal posture - Plan: PT plan of care cert/re-cert  Back pain, sacroiliac - Plan: PT plan of care cert/re-cert  Bilateral low back pain, with sciatica presence unspecified - Plan: PT plan of care cert/re-cert  Weakness of back - Plan: PT plan of care cert/re-cert      Subjective Assessment - 04/28/15 1016    Subjective Finished PT in October.  Referred back to PT to establish maintenance HEP for hypermobility related to Ehlers-Danlos.  Already completed FCE at [facility] location but states none of the recommendations were carried out by her employer.  Complains she can no longer run.  Can bike 10 min.  Would like home and gym program.  Would like to go to the [employer] to use [employer].  States she is 2x worse  since last Jan.  Feels her muscles atrophied since using.     Pertinent History Elhers-Danlos Type 3 with crossover with classic   How long can you sit comfortably? 10 min   How long can you walk comfortably? 10 min   Diagnostic tests Minimal lumbar spondylosis and facet arthrosis  without stenosis on MRI done 05/20/14   Patient Stated Goals be able to establish a plan of exercise for shoulders, hips, knees and ankles.  HEP and safe progression   Currently in Pain? Yes   Pain Score 7    Pain Location Hip   Pain Orientation Right   Pain Type Chronic pain   Pain Onset More than a month ago   Pain Frequency Intermittent   Aggravating Factors  extensive walking > 10 min, sitting > 10; driving > 15 min   Pain Relieving Factors med; SI belt day time   Pain Score 6   Pain Location Knee   Pain Orientation Right;Left   Aggravating Factors  standing bothers knees             [facility] PT Assessment - 04/28/15 1028    Assessment   Medical Diagnosis SI joint dysfunction right; hypermobility syndrome   Referring Provider Dr. Oneida Alar   Onset Date/Surgical Date --  Jan 2016   Next MD Visit February    Prior Therapy Fall 2016   Precautions   Precautions --  needs work Music therapist per [employer]; [employer] says no lift 20#   Restrictions   Weight Bearing Restrictions No   Balance Screen   Has the patient fallen in the past 6 months No   Has the patient had a decrease in activity level because  of a fear of falling?  No   Is the patient reluctant to leave their home because of a fear of falling?  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; horseback ride   Observation/Other Assessments   Focus on Therapeutic Outcomes (FOTO)  53% limitation   ROM / Strength   AROM / PROM / Strength AROM;Strength   AROM   AROM Assessment Site Shoulder   Right/Left Knee Right;Left   Lumbar Flexion touches floor with fingertips   Lumbar Extension WNL   Lumbar - Right Side Bend WNL   Lumbar - Left Side Bend WNL   Strength    Strength Assessment Site Lumbar   Right Hip Extension 4/5   Right Hip ABduction 4/5   Left Hip Extension 4/5   Left Hip ABduction 4/5   Right/Left Knee Right;Left   Right Knee Extension 4/5   Left Knee Extension 4/5   Lumbar Flexion 4-/5   Lumbar Extension 4-/5   Flexibility   Soft Tissue Assessment /Muscle Length --  hypermobility UE/LEs   Palpation   Spinal mobility hypermobility noted in LS spine   SI assessment  pain lateral SIJ border                           PT Education - 04/28/15 1715    Education provided Yes   Education Details abdominal brace; prone multifidi ex   Person(s) Educated Patient   Methods Explanation;Demonstration;Handout   Comprehension Verbalized understanding;Returned demonstration          PT Short Term Goals - 04/28/15 1724    PT SHORT TERM GOAL #1   Title She will be independent with inital HEP   05/26/15   Time 4   Period Weeks   Status New   PT SHORT TERM GOAL #2   Title She will report pain decrease 25% or more with transitional movements (esp sitting)   Status New   PT SHORT TERM GOAL #3   Title Pt will be able to report less pain end of work day (25% less)    Time 4   Status New           PT Long Term Goals - 04/28/15 1725    PT LONG TERM GOAL #1   Title independent with intial and all HEP/ gym program issued as of last visit  06/23/15   Time 8   Period Weeks   Status New   PT LONG TERM GOAL #2   Title Pt  will report pain decreased 50% or more and able to drive to work with minimal pain increase   Time 8   Period Weeks   Status New   PT LONG TERM GOAL #3   Title Pt will be able to stand for up to an hour with min pain increase   Time 8   Period Weeks   Status New   PT LONG TERM GOAL #4   Title Pt will be able to do stairs without pain increase for community mobility   Time 8   Period Weeks   Status New   PT LONG TERM  GOAL #5   Title Hip and core strength improved to 4+/5 needed for standing and  walking longer periods of time at work and in the community   Time 8   Period Weeks   Status New   Additional Long Term Goals   Additional Long Term Goals Yes   PT LONG TERM GOAL #6   Title FOTO functional outcome score improved from 53% to 42 % indicating improved function with decreased pain   Time 8   Period Weeks   Status New               Plan - 04/28/15 1717    Clinical Impression Statement The patient is of moderate complexity of evaluation.  She has Ehlers-Danlos (hypermobility syndrome) with Right SI dysfunction as well as knee and shoulder pain.  She reports a worsening of symptoms over the past 1 year.  She is unable to run, walk > 10 min, stand > 10 min and limited on sitting time as well.  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  She continues to have great difficulty performing her job duties.  Her hip and LE strength is grossly 4/5.  Decreased activtion of transverse abdominal and lumbar multifidi.  She would like to establish an appropriate HEP and gym program including how to progress safely.  She has an interest in Pilates.     Pt will benefit from skilled therapeutic intervention in order to improve on the following deficits Decreased strength;Improper body mechanics;Postural dysfunction;Difficulty walking;Decreased mobility;Hypermobility;Pain;Increased fascial restricitons   Rehab Potential Good   PT Frequency 2x / week   PT Duration 8 weeks   PT Treatment/Interventions Electrical Stimulation;Cryotherapy;Iontophoresis 4mg /ml Dexamethasone;Ultrasound;Patient/family education;Taping;Dry needling;Neuromuscular re-education;Balance training;Therapeutic exercise;Manual techniques;Other (comment)   PT Next Visit Plan PIlates; core stabilization;  isometrics of hip and spine;   review initial HEP;     PT Home Exercise Plan supine ab brace; prone multifidi series         Problem  List Patient Active Problem List   Diagnosis Date Noted  . Sacroiliac joint dysfunction of right side 11/26/2014  . Hypermobility syndrome 11/26/2014    Alvera Singh 04/28/2015, 5:29 PM  [facility] [address], [place], [place], [address] Phone: [phone]   Fax:  [phone]  Name: Lindsey [name] MRN: [id number] Date of Birth: 1965/12/24   Ruben Im, PT 04/28/2015 5:29 PM Phone: [phone] Fax: [phone]

## 2015-05-04 ENCOUNTER — Ambulatory Visit (INDEPENDENT_AMBULATORY_CARE_PROVIDER_SITE_OTHER): Payer: 59 | Admitting: Physician Assistant

## 2015-05-04 VITALS — BP 96/70 | HR 72 | Temp 98.0°F | Resp 16 | Ht 66.0 in | Wt 139.8 lb

## 2015-05-04 DIAGNOSIS — Q796 Ehlers-Danlos syndrome, unspecified: Secondary | ICD-10-CM

## 2015-05-04 DIAGNOSIS — G4700 Insomnia, unspecified: Secondary | ICD-10-CM

## 2015-05-04 MED ORDER — TRAZODONE HCL 50 MG PO TABS: ORAL_TABLET | ORAL | Status: DC

## 2015-05-04 NOTE — Patient Instructions (Signed)
I have given you 30 day supply of trazodone to give you time to get in with Dr. Oneida Alar. Return as needed.

## 2015-05-04 NOTE — Progress Notes (Signed)
[facility] [address], [place] [address] [phone]  Date:  05/04/2015   Name:  Lindsey [name]   DOB:  12-Dec-1965   MRN:  [id number]  PCP:  Haywood Pao, MD    Chief Complaint: Medication Refill   History of Present Illness:  This is a 50 y.o. female with PMH insomnia, ehlers-danlos syndrome and right sided SI joint dysfunction who is presenting needing refills of trazodone.  Pt states she was getting trazodone from Dr. Oneida Alar, sports medicine. He is who is managing her ehlers-danlos syndrome. She states she called the office to get refills and the front staff told her that was a controlled substance and she would have to be seen first. Pt is prescribed 50 mg QHS but she states that does not work and instead takes 100 mg QHS which works much better.  She was diagnosed with ehlers-danlos syndrome 12/2014 by Dr. Oneida Alar. She has been having problems with right SI joint instability x 1 year now. She is doing physical therapy which is helping a lot. Dr. Oneida Alar has placed her on work restrictions which included "walking no more than 4 hours of a regular 8 hour day and no more than one hour continuously. He also requested that the patient be allowed to work 2 days and then have one day off to pursue PT and rest" - per sports medicine notes. [place] has apparently been unable to honor these work restrictions. She is trying to find a new position within [employer] [place] that can honor these restrictions. She has been out of work since 12/2014.  Pt was seen here 04/15/15 and 04/17/15 for bronchitis. She was given a work note for these days that [employer] also apparently will not honor. Pt is wondering if I could fax a note to her employer explaining ehlers-danlos syndrome and explain why she needed to be out for bronchitis and seen if they accept this.  Review of Systems:  Review of Systems See HPI  Patient Active Problem List   Diagnosis Date Noted  . Ehlers-Danlos  syndrome 05/11/2015  . Sacroiliac joint dysfunction of right side 11/26/2014  . Hypermobility syndrome 11/26/2014    Prior to Admission medications   Medication Sig Start Date End Date Taking? Authorizing Provider  Albuterol Sulfate 108 (90 BASE) MCG/ACT AEPB Inhale into the lungs.   Yes Historical Provider, MD  gabapentin (NEURONTIN) 300 MG capsule Take 1 capsule (300 mg total) by mouth at bedtime. 04/17/14  Yes Melony Overly, MD  hyoscyamine (LEVBID) 0.375 MG 12 hr tablet Take 0.375 mg by mouth 2 (two) times daily.   Yes Historical Provider, MD  meloxicam (MOBIC) 15 MG tablet  12/30/14  Yes Historical Provider, MD  montelukast (SINGULAIR) 10 MG tablet Take 10 mg by mouth at bedtime.   Yes Historical Provider, MD  traMADol (ULTRAM) 50 MG tablet 50 mg daily as needed. 02/16/15  Yes Historical Provider, MD  traZODone (DESYREL) 50 MG tablet  12/31/14  Yes Historical Provider, MD    Allergies  Allergen Reactions  . Poison Oak Extract [Extract Of Poison Oak] Anaphylaxis  . Penicillins   . Sulfa Antibiotics     History reviewed. No pertinent past surgical history.  Social History  Substance Use Topics  . Smoking status: Never Smoker   . Smokeless tobacco: Never Used  . Alcohol Use: No    Family History  Problem Relation Age of Onset  . Hypertension Mother   . Stroke  Father   . Hypertension Father     Medication list has been reviewed and updated.  Physical Examination:  Physical Exam  Constitutional: She is oriented to person, place, and time. She appears well-developed and well-nourished. No distress.  HENT:  Head: Normocephalic and atraumatic.  Right Ear: Hearing normal.  Left Ear: Hearing normal.  Nose: Nose normal.  Eyes: Conjunctivae and lids are normal. Right eye exhibits no discharge. Left eye exhibits no discharge. No scleral icterus.  Pulmonary/Chest: Effort normal. No respiratory distress.  Musculoskeletal: Normal range of motion.  Neurological: She is alert and  oriented to person, place, and time.  Skin: Skin is warm, dry and intact. No lesion and no rash noted.  Psychiatric: She has a normal mood and affect. Her speech is normal and behavior is normal. Thought content normal.   BP 96/70 mmHg  Pulse 72  Temp(Src) 98 F (36.7 C) (Oral)  Resp 16  Ht 5\' 6"  (1.676 m)  Wt 139 lb 12.8 oz (63.413 kg)  BMI 22.58 kg/m2  SpO2 99%  Assessment and Plan:  1. Insomnia Refilled trazodone - wrote to take 100 mg QHS since 50 mg not helping. She is followed for insomnia by Dr. Oneida Alar - f/u with him. - traZODone (DESYREL) 50 MG tablet; Take 2-3 tabs po QHS for sleep.  Dispense: 90 tablet; Refill: 0  2. Ehlers-Danlos syndrome Diagnosed 3-4 months ago. She has been out of work. Dr. 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 She is asking that I write a note explaining her condition and why she needs work restrictions/time off for bronchitis. I have sent note to Ellwood Dense, ADA specialist at pt's request.   Benjaman Pott. Drenda Freeze, MHS [facility]  05/11/2015

## 2015-05-05 MED FILL — traZODone HCL 50 MG TABS: 50 | 30 days supply | Qty: 90 | Fill #0

## 2015-05-06 ENCOUNTER — Ambulatory Visit: Payer: 59 | Admitting: Physical Therapy

## 2015-05-06 ENCOUNTER — Encounter: Payer: Self-pay | Admitting: Physical Therapy

## 2015-05-06 DIAGNOSIS — M532X8 Spinal instabilities, sacral and sacrococcygeal region: Secondary | ICD-10-CM

## 2015-05-06 DIAGNOSIS — M545 Low back pain: Secondary | ICD-10-CM | POA: Diagnosis not present

## 2015-05-06 DIAGNOSIS — M62838 Other muscle spasm: Secondary | ICD-10-CM

## 2015-05-06 DIAGNOSIS — R29898 Other symptoms and signs involving the musculoskeletal system: Secondary | ICD-10-CM

## 2015-05-06 DIAGNOSIS — M6281 Muscle weakness (generalized): Secondary | ICD-10-CM | POA: Diagnosis not present

## 2015-05-06 DIAGNOSIS — R293 Abnormal posture: Secondary | ICD-10-CM

## 2015-05-06 DIAGNOSIS — M533 Sacrococcygeal disorders, not elsewhere classified: Secondary | ICD-10-CM

## 2015-05-06 DIAGNOSIS — Q796 Ehlers-Danlos syndrome, unspecified: Secondary | ICD-10-CM

## 2015-05-06 NOTE — Therapy (Signed)
[facility] [address], [place], [place], [address] Phone: [phone]   Fax:  [phone]  Physical Therapy Treatment  Patient Details  Name: Lindsey [name] MRN: [id number] Date of Birth: October 29, 1965 Referring Provider: Dr. Oneida Alar  Encounter Date: 05/06/2015      PT End of Session - 05/06/15 0807    Visit Number 2   Number of Visits 16   Date for PT Re-Evaluation 06/23/15   Authorization Type [facility] UMR   PT Start Time 0800   PT Stop Time 0849   PT Time Calculation (min) 49 min   Activity Tolerance Patient tolerated treatment well   Behavior During Therapy [facility] for tasks assessed/performed      Past Medical History  Diagnosis Date  . Asthma     History reviewed. No pertinent past surgical history.  There were no vitals filed for this visit.  Visit Diagnosis:  Ehlers-Danlos syndrome  Instability of sacroiliac joint  Abnormal posture  Back pain, sacroiliac  Bilateral low back pain, with sciatica presence unspecified  Weakness of back  Muscle spasm      Subjective Assessment - 05/06/15 0803    Subjective No new complaints this AM.   Currently in Pain? Yes   Pain Score 4    Pain Location Back   Pain Orientation Right;Left   Aggravating Factors  "pounding"   Pain Relieving Factors meds, SI belt   Multiple Pain Sites No                                 PT Education - 05/06/15 0826    Education provided Yes   Education Details HEP development    Person(s) Educated Patient   Methods Explanation;Demonstration;Tactile cues;Verbal cues  Pt already had handouts   Comprehension Verbalized understanding;Returned demonstration          PT Short Term Goals - 05/06/15 0827    PT SHORT TERM GOAL #1   Title She will be independent with inital HEP   05/26/15   Time 4   Period Weeks   Status On-going   PT SHORT TERM GOAL #2   Title She will report pain decrease 25% or more  with transitional movements (esp sitting)   Time 4   Period Weeks   Status On-going  Too early, just started   PT SHORT TERM GOAL #3   Title Pt will be able to report less pain end of work day (25% less)    Baseline has not worked, starts new job Monday   Time 4   Status On-going  Just started   PT SHORT TERM GOAL #4   Title Pt will understand concepts of stability and posture as it relates to her joint condition.    Time 4   Period Weeks   Status Achieved           PT Long Term Goals - 04/28/15 1725    PT LONG TERM GOAL #1   Title independent with intial and all HEP/ gym program issued as of last visit  06/23/15   Time 8   Period Weeks   Status New   PT LONG TERM GOAL #2   Title Pt  will report pain decreased 50% or more and able to drive to work with minimal pain increase   Time 8   Period Weeks   Status New   PT LONG TERM GOAL #3  Title Pt will be able to stand for up to an hour with min pain increase   Time 8   Period Weeks   Status New   PT LONG TERM GOAL #4   Title Pt will be able to do stairs without pain increase for community mobility   Time 8   Period Weeks   Status New   PT LONG TERM GOAL #5   Title Hip and core strength improved to 4+/5 needed for standing and walking longer periods of time at work and in the community   Time 8   Period Weeks   Status New   Additional Long Term Goals   Additional Long Term Goals Yes   PT LONG TERM GOAL #6   Title FOTO functional outcome score improved from 53% to 42 % indicating improved function with decreased pain   Time 8   Period Weeks   Status New               Plan - 05/06/15 0849    Clinical Impression Statement Alden Hipp reformulating her current HEP.  She currently needed help in finding neutral spine and slowing her movement down so she is more aware of her ROM. She has moderate difficulty finding her TA in supine. This did improve today with practice.    Pt will benefit from skilled therapeutic  intervention in order to improve on the following deficits Decreased strength;Improper body mechanics;Postural dysfunction;Difficulty walking;Decreased mobility;Hypermobility;Pain;Increased fascial restricitons   Rehab Potential Good   PT Frequency 2x / week   PT Duration 8 weeks   PT Treatment/Interventions Electrical Stimulation;Cryotherapy;Iontophoresis 4mg /ml Dexamethasone;Ultrasound;Patient/family education;Taping;Dry needling;Neuromuscular re-education;Balance training;Therapeutic exercise;Manual techniques;Other (comment)   PT Next Visit Plan Review HEP, work on bridge   Consulted and Agree with Plan of Care Patient        Problem List Patient Active Problem List   Diagnosis Date Noted  . Sacroiliac joint dysfunction of right side 11/26/2014  . Hypermobility syndrome 11/26/2014    Alger Kerstein, PTA 05/06/2015, 8:54 AM  [facility] [address] [place], [place], [address] Phone: [phone]   Fax:  [phone]  Name: Lindsey [name] MRN: [id number] Date of Birth: 12-06-1965

## 2015-05-08 ENCOUNTER — Encounter: Payer: Self-pay | Admitting: Physical Therapy

## 2015-05-08 ENCOUNTER — Ambulatory Visit: Payer: 59 | Admitting: Physical Therapy

## 2015-05-08 DIAGNOSIS — Q796 Ehlers-Danlos syndrome, unspecified: Secondary | ICD-10-CM

## 2015-05-08 DIAGNOSIS — M533 Sacrococcygeal disorders, not elsewhere classified: Secondary | ICD-10-CM

## 2015-05-08 DIAGNOSIS — M545 Low back pain: Secondary | ICD-10-CM

## 2015-05-08 DIAGNOSIS — M532X8 Spinal instabilities, sacral and sacrococcygeal region: Secondary | ICD-10-CM

## 2015-05-08 DIAGNOSIS — R293 Abnormal posture: Secondary | ICD-10-CM | POA: Diagnosis not present

## 2015-05-08 DIAGNOSIS — R29898 Other symptoms and signs involving the musculoskeletal system: Secondary | ICD-10-CM

## 2015-05-08 DIAGNOSIS — M6281 Muscle weakness (generalized): Secondary | ICD-10-CM | POA: Diagnosis not present

## 2015-05-08 DIAGNOSIS — M62838 Other muscle spasm: Secondary | ICD-10-CM | POA: Diagnosis not present

## 2015-05-08 NOTE — Therapy (Signed)
[facility] [address], [place], [place], [address] Phone: [phone]   Fax:  [phone]  Physical Therapy Treatment  Patient Details  Name: CORRINNE BROSMAN MRN: [id number] Date of Birth: Aug 15, 1965 Referring Provider: Dr. Oneida Alar  Encounter Date: 05/08/2015      PT End of Session - 05/08/15 1140    Visit Number 3   Number of Visits 16   Date for PT Re-Evaluation 06/23/15   Authorization Type [facility] UMR   PT Start Time 1101   PT Stop Time 1145   PT Time Calculation (min) 44 min   Activity Tolerance Patient tolerated treatment well   Behavior During Therapy [facility] for tasks assessed/performed      Past Medical History  Diagnosis Date  . Asthma     History reviewed. No pertinent past surgical history.  There were no vitals filed for this visit.  Visit Diagnosis:  Ehlers-Danlos syndrome  Instability of sacroiliac joint  Abnormal posture  Back pain, sacroiliac  Bilateral low back pain, with sciatica presence unspecified  Weakness of back      Subjective Assessment - 05/08/15 1102    Subjective I worked after last session nd walked a lot in ED. I think this combo resulted in some sharp pains later that night.    Currently in Pain? No/denies   Multiple Pain Sites No                         [facility] Adult PT Treatment/Exercise - 05/08/15 0001    Lumbar Exercises: Aerobic   Stationary Bike Seat 6, arms 9  Nustep L4 x 5 min   Lumbar Exercises: Supine   Bridge 5 reps;2 seconds  Hinge bridge with Pilates bias    Lumbar Exercises: Prone   Other Prone Lumbar Exercises TA contraction 8x 3 sec hold   Lumbar Exercises: Quadruped   Single Arm Raise Right;Left;5 reps   Straight Leg Raise 5 reps;2 seconds   Plank Sidelying  bil  modified 10-15 sec 2x,                   PT Short Term Goals - 05/06/15 0827    PT SHORT TERM GOAL #1   Title She will be independent with inital HEP    05/26/15   Time 4   Period Weeks   Status On-going   PT SHORT TERM GOAL #2   Title She will report pain decrease 25% or more with transitional movements (esp sitting)   Time 4   Period Weeks   Status On-going  Too early, just started   PT SHORT TERM GOAL #3   Title Pt will be able to report less pain end of work day (25% less)    Baseline has not worked, starts new job Monday   Time 4   Status On-going  Just started   PT SHORT TERM GOAL #4   Title Pt will understand concepts of stability and posture as it relates to her joint condition.    Time 4   Period Weeks   Status Achieved           PT Long Term Goals - 04/28/15 1725    PT LONG TERM GOAL #1   Title independent with intial and all HEP/ gym program issued as of last visit  06/23/15   Time 8   Period Weeks   Status New   PT LONG TERM GOAL #2  Title Pt  will report pain decreased 50% or more and able to drive to work with minimal pain increase   Time 8   Period Weeks   Status New   PT LONG TERM GOAL #3   Title Pt will be able to stand for up to an hour with min pain increase   Time 8   Period Weeks   Status New   PT LONG TERM GOAL #4   Title Pt will be able to do stairs without pain increase for community mobility   Time 8   Period Weeks   Status New   PT LONG TERM GOAL #5   Title Hip and core strength improved to 4+/5 needed for standing and walking longer periods of time at work and in the community   Time 8   Period Weeks   Status New   Additional Long Term Goals   Additional Long Term Goals Yes   PT LONG TERM GOAL #6   Title FOTO functional outcome score improved from 53% to 42 % indicating improved function with decreased pain   Time 8   Period Weeks   Status New               Plan - 05/08/15 1145    Clinical Impression Statement Went over technique for hinge bridge, prone TA contraction/feeling neutral, and quadruped UE/LE movements. We are finding small things to change wiithin these  exercises to make them feel better and not make her feel more pain.   Pt will benefit from skilled therapeutic intervention in order to improve on the following deficits Decreased strength;Improper body mechanics;Postural dysfunction;Difficulty walking;Decreased mobility;Hypermobility;Pain;Increased fascial restricitons   Rehab Potential Good   PT Frequency 2x / week   PT Duration 8 weeks   PT Treatment/Interventions Electrical Stimulation;Cryotherapy;Iontophoresis 4mg /ml Dexamethasone;Ultrasound;Patient/family education;Taping;Dry needling;Neuromuscular re-education;Balance training;Therapeutic exercise;Manual techniques;Other (comment)   PT Next Visit Plan Review HEP, work on plank in [place], and continue to work back into the prone series   Consulted and Agree with Plan of Care Patient        Problem List Patient Active Problem List   Diagnosis Date Noted  . Sacroiliac joint dysfunction of right side 11/26/2014  . Hypermobility syndrome 11/26/2014    Lakeya Mulka, PTA 05/08/2015, 11:51 AM  [facility] [address], [place], [place], [address] Phone: [phone]   Fax:  [phone]  Name: ADARA HAUTH MRN: [id number] Date of Birth: 03-Dec-1965

## 2015-05-11 ENCOUNTER — Ambulatory Visit: Payer: 59 | Admitting: Physical Therapy

## 2015-05-11 ENCOUNTER — Encounter: Payer: Self-pay | Admitting: Physical Therapy

## 2015-05-11 DIAGNOSIS — M545 Low back pain: Secondary | ICD-10-CM | POA: Diagnosis not present

## 2015-05-11 DIAGNOSIS — Q796 Ehlers-Danlos syndrome, unspecified: Secondary | ICD-10-CM

## 2015-05-11 DIAGNOSIS — R293 Abnormal posture: Secondary | ICD-10-CM | POA: Diagnosis not present

## 2015-05-11 DIAGNOSIS — R29898 Other symptoms and signs involving the musculoskeletal system: Secondary | ICD-10-CM

## 2015-05-11 DIAGNOSIS — M533 Sacrococcygeal disorders, not elsewhere classified: Secondary | ICD-10-CM | POA: Diagnosis not present

## 2015-05-11 DIAGNOSIS — M62838 Other muscle spasm: Secondary | ICD-10-CM

## 2015-05-11 DIAGNOSIS — M532X8 Spinal instabilities, sacral and sacrococcygeal region: Secondary | ICD-10-CM

## 2015-05-11 DIAGNOSIS — M6281 Muscle weakness (generalized): Secondary | ICD-10-CM | POA: Diagnosis not present

## 2015-05-11 NOTE — Therapy (Signed)
[facility] [address] [place], [place], [address] Phone: [phone]   Fax:  [phone]  Physical Therapy Treatment  Patient Details  Name: Lindsey [name] MRN: [id number] Date of Birth: 06-21-1965 Referring Provider: Dr. Oneida Alar  Encounter Date: 05/11/2015      PT End of Session - 05/11/15 1238    Visit Number 4   Number of Visits 16   Date for PT Re-Evaluation 06/23/15   Authorization Type [facility] UMR   PT Start Time 1229   PT Stop Time 1312   PT Time Calculation (min) 43 min   Activity Tolerance Patient tolerated treatment well   Behavior During Therapy [facility] for tasks assessed/performed      Past Medical History  Diagnosis Date  . Asthma     History reviewed. No pertinent past surgical history.  There were no vitals filed for this visit.  Visit Diagnosis:  Ehlers-Danlos syndrome  Instability of sacroiliac joint  Abnormal posture  Back pain, sacroiliac  Bilateral low back pain, with sciatica presence unspecified  Weakness of back  Muscle spasm      Subjective Assessment - 05/11/15 1236    Subjective Doing my gym workouts without increasing my pain.    Currently in Pain? Yes   Pain Score 4    Pain Location Sacrum   Pain Orientation Right;Left   Pain Descriptors / Indicators Dull   Aggravating Factors  pounding    Pain Relieving Factors meds, SI belt   Multiple Pain Sites No                         [facility] Adult PT Treatment/Exercise - 05/11/15 0001    Lumbar Exercises: Aerobic   Stationary Bike Nustep L5 6 min   Lumbar Exercises: Prone   Other Prone Lumbar Exercises TA contraction 8x 3 sec hold   Lumbar Exercises: Quadruped   Single Arm Raise Right;Left;5 reps;2 seconds   Straight Leg Raise 5 reps;2 seconds   Opposite Arm/Leg Raise Right arm/Left leg;Left arm/Right leg;5 reps;2 seconds   Plank Sidelying  bil  modified 20 sec 2x,                 PT Education -  05/11/15 1257    Education provided Yes   Education Details HEP develpoment   Person(s) Educated Patient   Methods Explanation;Demonstration;Tactile cues;Verbal cues;Handout   Comprehension Verbalized understanding;Returned demonstration          PT Short Term Goals - 05/11/15 1307    PT SHORT TERM GOAL #1   Title She will be independent with inital HEP   05/26/15   Time 4   Period Weeks   Status On-going   PT SHORT TERM GOAL #2   Title She will report pain decrease 25% or more with transitional movements (esp sitting)   Time 4   Period Weeks   Status On-going  10%   PT SHORT TERM GOAL #3   Title Pt will be able to report less pain end of work day (25% less)    Baseline has not worked, starts new job Monday   Time 4   Status On-going  10%           PT Long Term Goals - 04/28/15 1725    PT LONG TERM GOAL #1   Title independent with intial and all HEP/ gym program issued as of last visit  06/23/15   Time 8  Period Weeks   Status New   PT LONG TERM GOAL #2   Title Pt  will report pain decreased 50% or more and able to drive to work with minimal pain increase   Time 8   Period Weeks   Status New   PT LONG TERM GOAL #3   Title Pt will be able to stand for up to an hour with min pain increase   Time 8   Period Weeks   Status New   PT LONG TERM GOAL #4   Title Pt will be able to do stairs without pain increase for community mobility   Time 8   Period Weeks   Status New   PT LONG TERM GOAL #5   Title Hip and core strength improved to 4+/5 needed for standing and walking longer periods of time at work and in the community   Time 8   Period Weeks   Status New   Additional Long Term Goals   Additional Long Term Goals Yes   PT LONG TERM GOAL #6   Title FOTO functional outcome score improved from 53% to 42 % indicating improved function with decreased pain   Time 8   Period Weeks   Status New               Plan - 05/11/15 1300    Clinical Impression  Statement Pt is able to do her gym program which includes the modified exercises we have gone over in the clininc without making her hurt  more. We worked on our prone progression today. Pt had added some hamstring curls over the weekend which is easier than it used to be.    Pt will benefit from skilled therapeutic intervention in order to improve on the following deficits Decreased strength;Improper body mechanics;Postural dysfunction;Difficulty walking;Decreased mobility;Hypermobility;Pain;Increased fascial restricitons   Rehab Potential Good   PT Frequency 2x / week   PT Duration 8 weeks   PT Treatment/Interventions Electrical Stimulation;Cryotherapy;Iontophoresis 4mg /ml Dexamethasone;Ultrasound;Patient/family education;Taping;Dry needling;Neuromuscular re-education;Balance training;Therapeutic exercise;Manual techniques;Other (comment)   PT Next Visit Plan work on prone exercises   Consulted and Agree with Plan of Care Patient        Problem List Patient Active Problem List   Diagnosis Date Noted  . Ehlers-Danlos syndrome 05/11/2015  . Sacroiliac joint dysfunction of right side 11/26/2014  . Hypermobility syndrome 11/26/2014    Asma Boldon, PTA 05/11/2015, 1:12 PM  [facility] [address] [place], [place], [address] Phone: [phone]   Fax:  [phone]  Name: Lindsey [name] MRN: [id number] Date of Birth: 04-13-1965

## 2015-05-11 NOTE — Patient Instructions (Signed)
Easy  - Modified side plank 20-30 sec 2x each side   More difficult:   - Forearm plank 30 -45 sec 1-2 x  - Push ups: modified on the knees. Keep pelvis "lifted" so you are leading with the chest as the arms bend. Work on this to allow your upper body to get stronger but always engage your core!!

## 2015-05-13 ENCOUNTER — Encounter: Payer: Self-pay | Admitting: Physical Therapy

## 2015-05-13 ENCOUNTER — Ambulatory Visit: Payer: 59 | Attending: Sports Medicine | Admitting: Physical Therapy

## 2015-05-13 DIAGNOSIS — M533 Sacrococcygeal disorders, not elsewhere classified: Secondary | ICD-10-CM | POA: Insufficient documentation

## 2015-05-13 DIAGNOSIS — M6281 Muscle weakness (generalized): Secondary | ICD-10-CM | POA: Diagnosis not present

## 2015-05-13 DIAGNOSIS — M62838 Other muscle spasm: Secondary | ICD-10-CM | POA: Diagnosis not present

## 2015-05-13 DIAGNOSIS — R293 Abnormal posture: Secondary | ICD-10-CM | POA: Diagnosis not present

## 2015-05-13 DIAGNOSIS — M545 Low back pain: Secondary | ICD-10-CM | POA: Diagnosis not present

## 2015-05-13 DIAGNOSIS — R29898 Other symptoms and signs involving the musculoskeletal system: Secondary | ICD-10-CM

## 2015-05-13 DIAGNOSIS — M532X8 Spinal instabilities, sacral and sacrococcygeal region: Secondary | ICD-10-CM

## 2015-05-13 DIAGNOSIS — Q796 Ehlers-Danlos syndrome, unspecified: Secondary | ICD-10-CM

## 2015-05-13 NOTE — Therapy (Signed)
[facility] [address] [place], [place], [address] Phone: [phone]   Fax:  [phone]  Physical Therapy Treatment  Patient Details  Name: Lindsey [name] MRN: [id number] Date of Birth: 01/22/1966 Referring Provider: Dr. Oneida Alar  Encounter Date: 05/13/2015      PT End of Session - 05/13/15 0803    Visit Number 5   Number of Visits 16   Date for PT Re-Evaluation 06/23/15   Authorization Type [facility] UMR   PT Start Time 0800   PT Stop Time 0845   PT Time Calculation (min) 45 min   Activity Tolerance Patient tolerated treatment well   Behavior During Therapy [facility] for tasks assessed/performed      Past Medical History  Diagnosis Date  . Asthma     History reviewed. No pertinent past surgical history.  There were no vitals filed for this visit.  Visit Diagnosis:  Ehlers-Danlos syndrome  Instability of sacroiliac joint  Abnormal posture  Back pain, sacroiliac  Bilateral low back pain, with sciatica presence unspecified  Weakness of back      Subjective Assessment - 05/13/15 0802    Subjective My back has been doing pretty good lately. Reports using her right leg more to get into her truck which previously she was avoiding.    Currently in Pain? No/denies   Multiple Pain Sites No                         [facility] Adult PT Treatment/Exercise - 05/13/15 0001    Lumbar Exercises: Aerobic   Stationary Bike Bike L5 x 7 min   Lumbar Exercises: Supine   Ab Set --  10x TA contraction with Pilates breath   Bridge --  8x   Straight Leg Raise --  More of knee extension 2x bil altetrnating   Other Supine Lumbar Exercises Ab curl 2x5   Tried towel  to hammock cervical.    Lumbar Exercises: Sidelying   Other Sidelying Lumbar Exercises Modified side plank 30 sec bil   Lumbar Exercises: Prone   Other Prone Lumbar Exercises 10x 3 sec hol   Other Prone Lumbar Exercises knee bends 10x bil  Push ups  modified 5x with core engaged   Lumbar Exercises: Quadruped   Single Arm Raise Right;Left;3 seconds  2x with increased hold time   Straight Leg Raise --  3 bil holding 3 sec   Opposite Arm/Leg Raise Right arm/Left leg;Left arm/Right leg;3 seconds  2x bil                  PT Short Term Goals - 05/11/15 1307    PT SHORT TERM GOAL #1   Title She will be independent with inital HEP   05/26/15   Time 4   Period Weeks   Status On-going   PT SHORT TERM GOAL #2   Title She will report pain decrease 25% or more with transitional movements (esp sitting)   Time 4   Period Weeks   Status On-going  10%   PT SHORT TERM GOAL #3   Title Pt will be able to report less pain end of work day (25% less)    Baseline has not worked, starts new job Monday   Time 4   Status On-going  10%           PT Long Term Goals - 04/28/15 1725    PT LONG TERM GOAL #1  Title independent with intial and all HEP/ gym program issued as of last visit  06/23/15   Time 8   Period Weeks   Status New   PT LONG TERM GOAL #2   Title Pt  will report pain decreased 50% or more and able to drive to work with minimal pain increase   Time 8   Period Weeks   Status New   PT LONG TERM GOAL #3   Title Pt will be able to stand for up to an hour with min pain increase   Time 8   Period Weeks   Status New   PT LONG TERM GOAL #4   Title Pt will be able to do stairs without pain increase for community mobility   Time 8   Period Weeks   Status New   PT LONG TERM GOAL #5   Title Hip and core strength improved to 4+/5 needed for standing and walking longer periods of time at work and in the community   Time 8   Period Weeks   Status New   Additional Long Term Goals   Additional Long Term Goals Yes   PT LONG TERM GOAL #6   Title FOTO functional outcome score improved from 53% to 42 % indicating improved function with decreased pain   Time 8   Period Weeks   Status New               Plan -  05/13/15 0835    Clinical Impression Statement pt is doing quite well participating in her exercise program which includes going to the gym and home exercises. Currently she is able to exercise every other day, a rest day in between. The results of this frequency is no increase in pain.  We are keeping reps low but very slow and precise in the movements. This again is helping her  to exercise and  perform her ADLS without pain or at least without increased pain.    Pt will benefit from skilled therapeutic intervention in order to improve on the following deficits Decreased strength;Improper body mechanics;Postural dysfunction;Difficulty walking;Decreased mobility;Hypermobility;Pain;Increased fascial restricitons   Rehab Potential Good   PT Frequency 2x / week   PT Duration 8 weeks   PT Treatment/Interventions Electrical Stimulation;Cryotherapy;Iontophoresis 4mg /ml Dexamethasone;Ultrasound;Patient/family education;Taping;Dry needling;Neuromuscular re-education;Balance training;Therapeutic exercise;Manual techniques;Other (comment)   PT Next Visit Plan Review ab curl   Consulted and Agree with Plan of Care Patient        Problem List Patient Active Problem List   Diagnosis Date Noted  . Ehlers-Danlos syndrome 05/11/2015  . Sacroiliac joint dysfunction of right side 11/26/2014  . Hypermobility syndrome 11/26/2014    Brycelyn Gambino, PTA 05/13/2015, 8:43 AM  [facility] [address], [place], [place], [address] Phone: [phone]   Fax:  [phone]  Name: Lindsey [name] MRN: [id number] Date of Birth: 1965-05-08

## 2015-05-14 ENCOUNTER — Telehealth: Payer: Self-pay

## 2015-05-14 DIAGNOSIS — N6452 Nipple discharge: Secondary | ICD-10-CM | POA: Diagnosis not present

## 2015-05-14 DIAGNOSIS — N6459 Other signs and symptoms in breast: Secondary | ICD-10-CM | POA: Diagnosis not present

## 2015-05-14 NOTE — Telephone Encounter (Signed)
Patient left a message on medical records voicemail requesting Bennett Scrape, PA-C write an out of work letter to her employer [employer]. Please call when ready at [phone].

## 2015-05-15 ENCOUNTER — Other Ambulatory Visit: Payer: Self-pay | Admitting: Obstetrics and Gynecology

## 2015-05-15 DIAGNOSIS — N644 Mastodynia: Secondary | ICD-10-CM | POA: Diagnosis not present

## 2015-05-15 DIAGNOSIS — N6489 Other specified disorders of breast: Secondary | ICD-10-CM

## 2015-05-15 DIAGNOSIS — L299 Pruritus, unspecified: Secondary | ICD-10-CM

## 2015-05-15 DIAGNOSIS — N61 Mastitis without abscess: Secondary | ICD-10-CM

## 2015-05-15 DIAGNOSIS — N6452 Nipple discharge: Secondary | ICD-10-CM

## 2015-05-15 NOTE — Telephone Encounter (Signed)
Left VM for pt to call back regarding note. Not sure if patient needs new note or one Elmyra Ricks originally wrote for her.

## 2015-05-15 NOTE — Telephone Encounter (Signed)
Pt returned call. Pt just needed original letter printed so she could pick letter up. I have printed letter and is ready for pick up.

## 2015-05-20 ENCOUNTER — Ambulatory Visit
Admission: RE | Admit: 2015-05-20 | Discharge: 2015-05-20 | Disposition: A | Discharge status: Home / Self Care | Payer: 59 | Source: Ambulatory Visit | Attending: Obstetrics and Gynecology | Admitting: Obstetrics and Gynecology

## 2015-05-20 ENCOUNTER — Encounter: Payer: Self-pay | Admitting: Physical Therapy

## 2015-05-20 ENCOUNTER — Ambulatory Visit: Payer: 59 | Admitting: Physical Therapy

## 2015-05-20 DIAGNOSIS — N61 Mastitis without abscess: Secondary | ICD-10-CM

## 2015-05-20 DIAGNOSIS — N6489 Other specified disorders of breast: Secondary | ICD-10-CM

## 2015-05-20 DIAGNOSIS — Q796 Ehlers-Danlos syndrome, unspecified: Secondary | ICD-10-CM

## 2015-05-20 DIAGNOSIS — L299 Pruritus, unspecified: Secondary | ICD-10-CM

## 2015-05-20 DIAGNOSIS — R293 Abnormal posture: Secondary | ICD-10-CM | POA: Diagnosis not present

## 2015-05-20 DIAGNOSIS — M6281 Muscle weakness (generalized): Secondary | ICD-10-CM | POA: Diagnosis not present

## 2015-05-20 DIAGNOSIS — M545 Low back pain: Secondary | ICD-10-CM

## 2015-05-20 DIAGNOSIS — N6452 Nipple discharge: Secondary | ICD-10-CM

## 2015-05-20 DIAGNOSIS — M533 Sacrococcygeal disorders, not elsewhere classified: Secondary | ICD-10-CM

## 2015-05-20 DIAGNOSIS — M62838 Other muscle spasm: Secondary | ICD-10-CM | POA: Diagnosis not present

## 2015-05-20 DIAGNOSIS — R922 Inconclusive mammogram: Secondary | ICD-10-CM | POA: Diagnosis not present

## 2015-05-20 DIAGNOSIS — N644 Mastodynia: Secondary | ICD-10-CM | POA: Diagnosis not present

## 2015-05-20 DIAGNOSIS — R29898 Other symptoms and signs involving the musculoskeletal system: Secondary | ICD-10-CM

## 2015-05-20 DIAGNOSIS — M532X8 Spinal instabilities, sacral and sacrococcygeal region: Secondary | ICD-10-CM

## 2015-05-20 NOTE — Therapy (Signed)
[facility] [address], [place], [place], [address] Phone: [phone]   Fax:  [phone]  Physical Therapy Treatment  Patient Details  Name: Lindsey [name] MRN: [id number] Date of Birth: Aug 21, 1965 Referring Provider: Dr. Oneida Alar  Encounter Date: 05/20/2015      PT End of Session - 05/20/15 1530    Visit Number 6   Number of Visits 16   Date for PT Re-Evaluation 06/23/15   Authorization Type [facility] UMR   PT Start Time [id number]   PT Stop Time 1535   PT Time Calculation (min) 50 min   Activity Tolerance Patient tolerated treatment well;Patient limited by pain   Behavior During Therapy [facility] for tasks assessed/performed      Past Medical History  Diagnosis Date  . Asthma     History reviewed. No pertinent past surgical history.  There were no vitals filed for this visit.  Visit Diagnosis:  Ehlers-Danlos syndrome  Instability of sacroiliac joint  Abnormal posture  Back pain, sacroiliac  Bilateral low back pain, with sciatica presence unspecified  Weakness of back  Muscle spasm      Subjective Assessment - 05/20/15 1453    Subjective I am having more pain than normal.    Pertinent History Elhers-Danlos Type 3 with crossover with classic   Limitations Sitting;Standing;Walking;House hold activities;Lifting;Other (comment)  working at [employer] long can you sit comfortably? 10 min   How long can you stand comfortably? better than sit, >10 min    How long can you walk comfortably? 10 min   Diagnostic tests Minimal lumbar spondylosis and facet arthrosis without stenosis on MRI done 05/20/14   Patient Stated Goals be able to establish a plan of exercise for shoulders, hips, knees and ankles.  HEP and safe progression   Currently in Pain? Yes   Pain Score 6    Pain Location Sacrum  neck, shoulder   Pain Orientation Left;Right   Pain Descriptors / Indicators Burning;Tightness;Sharp   Pain Type Chronic pain   Pain Radiating Towards right post thigh to bottom of foot and toes   Pain Onset More than a month ago   Pain Frequency Intermittent   Aggravating Factors  pounding   Pain Relieving Factors meds, SI belt   Effect of Pain on Daily Activities limits work schedule, very painful to work   Multiple Pain Sites No            [facility] PT Assessment - 05/20/15 0001    Cognition   Overall Cognitive Status Within Functional Limits for tasks assessed   Strength   Right Knee Extension 4/5   Left Knee Extension 4/5   Lumbar Flexion 4-/5   Lumbar Extension 4-/5   Palpation   SI assessment  pelvis in correct alignment                     [facility] Adult PT Treatment/Exercise - 05/20/15 0001    Lumbar Exercises: Aerobic   Stationary Bike Bike L5 x 7 min   Lumbar Exercises: Supine   Ab Set --  10x TA contraction with Pilates breath   Bridge --  8x   Straight Leg Raise --  More of knee extension 2x bil altetrnating   Lumbar Exercises: Quadruped   Straight Leg Raise 5 reps  alternate holding spinal neutral   Opposite Arm/Leg Raise Right arm/Left leg;Left arm/Right leg;3 seconds  2x bil   Plank Sidelying  bil  modified 20  sec 2x, pummel under trunk   Modalities   Modalities Moist Heat   Moist Heat Therapy   Number Minutes Moist Heat 15 Minutes   Moist Heat Location Lumbar Spine;Shoulder   Manual Therapy   Manual Therapy Soft tissue mobilization   Soft tissue mobilization right upper trap, right interscapular area, right shoulder girdle                PT Education - 05/20/15 1530    Education provided No          PT Short Term Goals - 05/20/15 1525    PT SHORT TERM GOAL #1   Title She will be independent with inital HEP   05/26/15   Time 4   Status Achieved   PT SHORT TERM GOAL #2   Title She will report pain decrease 25% or more with transitional movements (esp sitting)   Time 4   Period Weeks   Status On-going  10%   PT SHORT TERM GOAL #3   Title Pt will  be able to report less pain end of work day (25% less)    Baseline has not worked, starts new job Monday   Time 4   Period Weeks   Status On-going  increased pain day   PT SHORT TERM GOAL #4   Title Pt will understand concepts of stability and posture as it relates to her joint condition.    Time 4   Period Weeks   Status Achieved           PT Long Term Goals - 04/28/15 1725    PT LONG TERM GOAL #1   Title independent with intial and all HEP/ gym program issued as of last visit  06/23/15   Time 8   Period Weeks   Status New   PT LONG TERM GOAL #2   Title Pt  will report pain decreased 50% or more and able to drive to work with minimal pain increase   Time 8   Period Weeks   Status New   PT LONG TERM GOAL #3   Title Pt will be able to stand for up to an hour with min pain increase   Time 8   Period Weeks   Status New   PT LONG TERM GOAL #4   Title Pt will be able to do stairs without pain increase for community mobility   Time 8   Period Weeks   Status New   PT LONG TERM GOAL #5   Title Hip and core strength improved to 4+/5 needed for standing and walking longer periods of time at work and in the community   Time 8   Period Weeks   Status New   Additional Long Term Goals   Additional Long Term Goals Yes   PT LONG TERM GOAL #6   Title FOTO functional outcome score improved from 53% to 42 % indicating improved function with decreased pain   Time 8   Period Weeks   Status New               Plan - 05/20/15 1526    Clinical Impression Statement Patient is having an increased pain day to 4/10 due to being on the phone at work and pain in right shoulder and back. Patient needed tactle cues to perform exercises with correct stabilization exercises.  Patient was unabel to do modify PLANK due to righ thsoulder pain therefore neede a pummel under her trunk. Patient will  benefit from skilled therapy to increse stabilization.    Pt will benefit from skilled therapeutic  intervention in order to improve on the following deficits Decreased strength;Improper body mechanics;Postural dysfunction;Difficulty walking;Decreased mobility;Hypermobility;Pain;Increased fascial restricitons   Rehab Potential Good   Clinical Impairments Affecting Rehab Potential None   PT Frequency 2x / week   PT Duration 8 weeks   PT Treatment/Interventions Electrical Stimulation;Cryotherapy;Iontophoresis 4mg /ml Dexamethasone;Ultrasound;Patient/family education;Taping;Dry needling;Neuromuscular re-education;Balance training;Therapeutic exercise;Manual techniques;Other (comment)   PT Next Visit Plan stabilization exercises   PT Home Exercise Plan supine ab brace; prone multifidi series   Consulted and Agree with Plan of Care Patient        Problem List Patient Active Problem List   Diagnosis Date Noted  . Ehlers-Danlos syndrome 05/11/2015  . Sacroiliac joint dysfunction of right side 11/26/2014  . Hypermobility syndrome 11/26/2014    Earlie Counts, PT 05/20/2015 3:31 PM   [facility] [address] [place], [place], [address] Phone: [phone]   Fax:  [phone]  Name: Lindsey [name] MRN: [id number] Date of Birth: Jun 12, 1965

## 2015-05-22 ENCOUNTER — Telehealth: Payer: Self-pay

## 2015-05-22 NOTE — Telephone Encounter (Signed)
Received FMLA forms from [employer] via fax on 05/20/2015. Called patient to get a better understanding of her situation. She was given an out of work note on 04/17/2015 and 05/12/2015. I need to clarify if the FMLA is still needed in place of these notes. Waiting on a return phone call from this patient. Blank copy of her FMLA forms have been scanned into her chart and the original blank copy filed in the bottom drawer.   Thanks, [employer]

## 2015-05-22 NOTE — Telephone Encounter (Signed)
Patient returned phone call. She states that she actually has a disability and she is going to file this through [employer]. They will fax over paperwork in the next week. Patient has asked that we disregard the FMLA forms.

## 2015-05-25 MED FILL — OSCIMIN SR 0.375 MG TABLET: 0.375 | 90 days supply | Qty: 90 | Fill #3

## 2015-05-25 NOTE — Telephone Encounter (Signed)
Patient called in stating that the FMLA forms do not need to be completed but we need to fax the last note that Bennett Scrape wrote for her at her last visit to [phone]. I sent the letter via fax on 05/25/15.

## 2015-05-27 ENCOUNTER — Encounter: Payer: Self-pay | Admitting: Physical Therapy

## 2015-05-27 ENCOUNTER — Ambulatory Visit: Payer: 59 | Admitting: Physical Therapy

## 2015-05-27 DIAGNOSIS — M6281 Muscle weakness (generalized): Secondary | ICD-10-CM | POA: Diagnosis not present

## 2015-05-27 DIAGNOSIS — M532X8 Spinal instabilities, sacral and sacrococcygeal region: Secondary | ICD-10-CM

## 2015-05-27 DIAGNOSIS — M62838 Other muscle spasm: Secondary | ICD-10-CM | POA: Diagnosis not present

## 2015-05-27 DIAGNOSIS — R293 Abnormal posture: Secondary | ICD-10-CM

## 2015-05-27 DIAGNOSIS — M533 Sacrococcygeal disorders, not elsewhere classified: Secondary | ICD-10-CM | POA: Diagnosis not present

## 2015-05-27 DIAGNOSIS — M545 Low back pain: Secondary | ICD-10-CM | POA: Diagnosis not present

## 2015-05-27 DIAGNOSIS — R29898 Other symptoms and signs involving the musculoskeletal system: Secondary | ICD-10-CM

## 2015-05-27 DIAGNOSIS — Q796 Ehlers-Danlos syndrome, unspecified: Secondary | ICD-10-CM

## 2015-05-27 NOTE — Therapy (Signed)
[facility] [address] [place], [place], [address] Phone: [phone]   Fax:  [phone]  Physical Therapy Treatment  Patient Details  Name: Lindsey [name] MRN: [id number] Date of Birth: 11-03-1965 Referring Provider: Dr. Oneida Alar  Encounter Date: 05/27/2015      PT End of Session - 05/27/15 1039    Visit Number 7   Number of Visits 16   Date for PT Re-Evaluation 06/23/15   Authorization Type [facility]   PT Start Time 1020   PT Stop Time 1106   PT Time Calculation (min) 46 min   Activity Tolerance Patient tolerated treatment well;Patient limited by pain   Behavior During Therapy [facility] for tasks assessed/performed      Past Medical History  Diagnosis Date  . Asthma     History reviewed. No pertinent past surgical history.  There were no vitals filed for this visit.  Visit Diagnosis:  Ehlers-Danlos syndrome  Instability of sacroiliac joint  Abnormal posture  Back pain, sacroiliac  Bilateral low back pain, with sciatica presence unspecified  Weakness of back  Muscle spasm      Subjective Assessment - 05/27/15 1034    Subjective Better today. She is having difficulty at work getting the correct accomodations with work station set up. This is increasing her pain per her report. She has not had to make major modifications to her exercises, just slight, showing her excellent understanding of how to modify if she does experience pain.    Currently in Pain? Yes   Pain Score 6    Pain Location Sacrum   Pain Orientation Right;Left   Pain Descriptors / Indicators Sharp   Aggravating Factors  Work set up   Pain Relieving Factors Not much lately   Multiple Pain Sites No            [facility] PT Assessment - 05/27/15 0001    Strength   Right Hip Extension 4/5   Right Hip ABduction 4-/5  mild pain in pelvis/hip   Left Hip Extension 4-/5  Initial 3/5 secondary pain, repeart with emphasis on length    Left  Hip ABduction 5/5   Lumbar Flexion 4-/5   Lumbar Extension 4-/5   Palpation   SI assessment  pelvis in correct alignment                     [facility] Adult PT Treatment/Exercise - 05/27/15 0001    Lumbar Exercises: Aerobic   Stationary Bike Nustep L5 x 10 min   Lumbar Exercises: Supine   Ab Set --  10x TA contraction with Pilates breath   Heel Slides 5 reps   Bridge --  10x   Other Supine Lumbar Exercises Ab curl with cervical support 8x    Lumbar Exercises: Prone   Other Prone Lumbar Exercises Plank x 30 sec                  PT Short Term Goals - 05/27/15 1039    PT SHORT TERM GOAL #1   Title She will be independent with inital HEP   05/26/15   Time 4   Period Weeks   Status Achieved   PT SHORT TERM GOAL #2   Title She will report pain decrease 25% or more with transitional movements (esp sitting)   Time 4   Period Weeks   Status On-going  This is no better since recent flare up.   PT SHORT TERM GOAL #  3   Title Pt will be able to report less pain end of work day (25% less)    Time 4   Period Weeks   Status On-going   PT SHORT TERM GOAL #4   Title Pt will understand concepts of stability and posture as it relates to her joint condition.    Time 4   Period Weeks   Status Achieved           PT Long Term Goals - 04/28/15 1725    PT LONG TERM GOAL #1   Title independent with intial and all HEP/ gym program issued as of last visit  06/23/15   Time 8   Period Weeks   Status New   PT LONG TERM GOAL #2   Title Pt  will report pain decreased 50% or more and able to drive to work with minimal pain increase   Time 8   Period Weeks   Status New   PT LONG TERM GOAL #3   Title Pt will be able to stand for up to an hour with min pain increase   Time 8   Period Weeks   Status New   PT LONG TERM GOAL #4   Title Pt will be able to do stairs without pain increase for community mobility   Time 8   Period Weeks   Status New   PT LONG TERM GOAL #5    Title Hip and core strength improved to 4+/5 needed for standing and walking longer periods of time at work and in the community   Time 8   Period Weeks   Status New   Additional Long Term Goals   Additional Long Term Goals Yes   PT LONG TERM GOAL #6   Title FOTO functional outcome score improved from 53% to 42 % indicating improved function with decreased pain   Time 8   Period Weeks   Status New               Plan - 05/27/15 1121    Clinical Impression Statement Pt reports difficulty at working with her accomodations specifically as they relate to her work stations and lack of head set for phone calls. Some improvements seen in hip strength, but left hip extension remains difficult to keep her length througout her low back  and not compress.    Pt will benefit from skilled therapeutic intervention in order to improve on the following deficits Decreased strength;Improper body mechanics;Postural dysfunction;Difficulty walking;Decreased mobility;Hypermobility;Pain;Increased fascial restricitons   Rehab Potential Good   Clinical Impairments Affecting Rehab Potential None   PT Frequency 2x / week   PT Duration 8 weeks   PT Treatment/Interventions Electrical Stimulation;Cryotherapy;Iontophoresis 4mg /ml Dexamethasone;Ultrasound;Patient/family education;Taping;Dry needling;Neuromuscular re-education;Balance training;Therapeutic exercise;Manual techniques;Other (comment)   PT Next Visit Plan To MD tomorrow   Consulted and Agree with Plan of Care Patient        Problem List Patient Active Problem List   Diagnosis Date Noted  . Ehlers-Danlos syndrome 05/11/2015  . Sacroiliac joint dysfunction of right side 11/26/2014  . Hypermobility syndrome 11/26/2014    Tarell Schollmeyer, PTA 05/27/2015, 11:24 AM  [facility] [address] [place], [place], [address] Phone: [phone]   Fax:  [phone]  Name: Lindsey [name] MRN: [id number] Date of Birth: [birth year]

## 2015-05-28 ENCOUNTER — Encounter: Payer: Self-pay | Admitting: Sports Medicine

## 2015-05-28 ENCOUNTER — Ambulatory Visit (INDEPENDENT_AMBULATORY_CARE_PROVIDER_SITE_OTHER): Payer: 59 | Admitting: Sports Medicine

## 2015-05-28 VITALS — BP 124/88 | HR 86 | Ht 66.0 in | Wt 139.0 lb

## 2015-05-28 DIAGNOSIS — M533 Sacrococcygeal disorders, not elsewhere classified: Secondary | ICD-10-CM

## 2015-05-28 DIAGNOSIS — Q796 Ehlers-Danlos syndrome, unspecified: Secondary | ICD-10-CM

## 2015-05-28 DIAGNOSIS — G4700 Insomnia, unspecified: Secondary | ICD-10-CM | POA: Diagnosis not present

## 2015-05-28 MED ORDER — MELOXICAM 15 MG PO TABS: 15.0000 mg | ORAL_TABLET | Freq: Every day | ORAL | Status: DC

## 2015-05-28 MED ORDER — TRAZODONE HCL 50 MG PO TABS: ORAL_TABLET | ORAL | Status: DC

## 2015-05-28 MED ORDER — TRAMADOL HCL 50 MG PO TABS
50.0000 mg | ORAL_TABLET | Freq: Three times a day (TID) | ORAL | Status: DC | PRN
Start: 2015-05-28 — End: 2015-07-02

## 2015-05-28 MED FILL — traMADol HCL 50 MG TABS: 50 | 30 days supply | Qty: 90 | Fill #0

## 2015-05-28 MED FILL — traZODone HCL 50 MG TABS: 50 | 90 days supply | Qty: 270 | Fill #0

## 2015-05-28 NOTE — Assessment & Plan Note (Signed)
I think she needs confirmation of this Dx by ED specialists at [facility] To see Dr Melburn Hake

## 2015-05-28 NOTE — Progress Notes (Signed)
Patient ID: Lindsey [name], female   DOB: [birth year], 50 y.o.   MRN: [id number]  Patient returns for followup of back pain  Chief complaint: chronic sacroiliac joint pain and instability  This patient has been in a frequent physical therapy program She has had significant work accommodation Her current job is primarily with working telephone We limited the amount of walking standing and lifting she was to do  She states that she still has significant pain at work and lots of limitations She brought a Theatre stage manager of requested accommodations  A couple of things have helped her low back pain 1 a sacroiliac soft brace 2 Pilates stabilization exercises on a reformer  She would like to go to physical therapy more than once a week but feels that when she works 8 hours she is too tired She has found a gymnasium that has a Research officer, political party and would like to do classes  Currently she complains of pain that is severe up to 8 of 10 She states that this occurs even more often Sitting 10 minutes can bring it down She says that the job change has not really improved her symptoms  Because clinically she meets the criteria for [employer] type III I have referred her to [facility] to the genetics program. She has an appointment with Dr. Mare Loan in 6 months  She had seen Dr. Lynann Bologna a spine specialist prior to seeing me He recommended SI joint fusion because of her persistent pain I was concerned she might have difficulty witht his healing because of her hypermobility  She also had a work evaluation by Pearson Forster, PT and met criteria for light work Dr. Dannial Monarch in [place] [facility]  has evaluated her as well   ROS No sciatica Excessive fatigue at times  Physical exam Patient looks a bit down but is in no acute distress She is a thin female Oriented x3 BP 124/88 mmHg  Pulse 86  Ht 5' 6"  (1.676 m)  Wt 139 lb (63.05 kg)  BMI 22.45 kg/m2  Examination reveals that she can do forward flexion  and touch her hands to the floor without much pain 1 leg back extension left w mild pain 1 leg back ext on RT c/o significant pain  Lying C/o pain with FABER bilat No limitation of SI motion or hip ROM  She can do a back bridge even on 1 leg without pain She can do a prone leg extension without pain  She feels pain with a lateral trunk stretch  Pain today localizes on all exercises to RT SIJ

## 2015-05-28 NOTE — Assessment & Plan Note (Signed)
I renewed 3 medications Tramadol Trazodone Mobic All have helped her pain  Work Engineer, materials I advised her that I did NOT have any EBM to suggest that additional accommodations would change her pain issues I will continue current recommendations for next 6 months but not add new restrictions Based on PT evaluation she is in a low intensity job now  PT I think continuing once weekly and then doing a pilates program 3x per week holds promise for helping her stabilize this instability over several months Given a note to support joining gym with pilates reformer classes  Prognosis Her pain and limitations are more significant than what I can demonstrate on her PE For this reason I think the approach given above is rational and hopefully will help If not she should consider getting a second opinion from another spine specialist to see if surgery is really best option If she cannot work with the low intensity level job she is given, she should consider seeing disability evaluator  I spent 40 minutes face to face counseling about these issues and work difficulties as well as need for confirmation of her ED diagnosis.

## 2015-05-29 ENCOUNTER — Ambulatory Visit: Payer: 59 | Admitting: Physical Therapy

## 2015-05-29 ENCOUNTER — Encounter: Payer: Self-pay | Admitting: Physical Therapy

## 2015-05-29 DIAGNOSIS — R29898 Other symptoms and signs involving the musculoskeletal system: Secondary | ICD-10-CM

## 2015-05-29 DIAGNOSIS — R293 Abnormal posture: Secondary | ICD-10-CM

## 2015-05-29 DIAGNOSIS — M533 Sacrococcygeal disorders, not elsewhere classified: Secondary | ICD-10-CM

## 2015-05-29 DIAGNOSIS — Q796 Ehlers-Danlos syndrome, unspecified: Secondary | ICD-10-CM

## 2015-05-29 DIAGNOSIS — Z0000 Encounter for general adult medical examination without abnormal findings: Secondary | ICD-10-CM | POA: Diagnosis not present

## 2015-05-29 DIAGNOSIS — M532X8 Spinal instabilities, sacral and sacrococcygeal region: Secondary | ICD-10-CM

## 2015-05-29 DIAGNOSIS — M545 Low back pain: Secondary | ICD-10-CM

## 2015-05-29 DIAGNOSIS — M859 Disorder of bone density and structure, unspecified: Secondary | ICD-10-CM | POA: Diagnosis not present

## 2015-05-29 DIAGNOSIS — M62838 Other muscle spasm: Secondary | ICD-10-CM | POA: Diagnosis not present

## 2015-05-29 DIAGNOSIS — M6281 Muscle weakness (generalized): Secondary | ICD-10-CM | POA: Diagnosis not present

## 2015-05-29 NOTE — Therapy (Signed)
[facility] [address], [place], [place], [address] Phone: [phone]   Fax:  [phone]  Physical Therapy Treatment  Patient Details  Name: Lindsey [name] MRN: [id number] Date of Birth: 11-Feb-1966 Referring Provider: Dr. Oneida Alar  Encounter Date: 05/29/2015      PT End of Session - 05/29/15 1146    Visit Number 8   Number of Visits 16   Date for PT Re-Evaluation 06/23/15   Authorization Type [facility] U[facility]   PT Start Time 1054   PT Stop Time 1140   PT Time Calculation (min) 46 min   Activity Tolerance Patient tolerated treatment well   Behavior During Therapy [facility] for tasks assessed/performed      Past Medical History  Diagnosis Date  . Asthma     History reviewed. No pertinent past surgical history.  There were no vitals filed for this visit.  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 She would like to try the eliptical and add this into her gym program.   Currently in Pain? Yes   Pain Score 3    Pain Location Sacrum   Pain Orientation Right;Left;Lower   Pain Descriptors / Indicators Sore   Multiple Pain Sites No                         [facility] Adult PT Treatment/Exercise - 05/29/15 0001    Lumbar Exercises: Aerobic   Elliptical L1 x 5 min   Lumbar Exercises: Supine   Bridge 10 reps;3 seconds  add ball   Other Supine Lumbar Exercises Ab curl with cervical support 3x5   Towel to support head/neck   Lumbar Exercises: Sidelying   Hip Abduction 5 reps  2 sets 0#, VC on technique   Other Sidelying Lumbar Exercises Hip kick frd/bkwrd 2x5   Lumbar Exercises: Quadruped   Opposite Arm/Leg Raise Right  arm/Left leg;Left arm/Right leg;3 seconds  5x bil   Plank Straight arm plank 2 x40 sec                  PT Short Term Goals - 05/27/15 1039    PT SHORT TERM GOAL #1   Title She will be independent with inital HEP   05/26/15   Time 4   Period Weeks   Status Achieved   PT SHORT TERM GOAL #2   Title She will report pain decrease 25% or more with transitional movements (esp sitting)   Time 4   Period Weeks   Status On-going  This is no better since recent flare up.   PT SHORT TERM GOAL #3   Title Pt will be able to report less pain end of work day (25% less)    Time 4   Period Weeks   Status On-going   PT SHORT TERM GOAL #4   Title Pt will understand concepts of stability and posture as it relates to her joint condition.    Time 4   Period Weeks   Status Achieved           PT Long Term Goals - 04/28/15 1725    PT LONG TERM GOAL #1  Title independent with intial and all HEP/ gym program issued as of last visit  06/23/15   Time 8   Period Weeks   Status New   PT LONG TERM GOAL #2   Title Pt  will report pain decreased 50% or more and able to drive to work with minimal pain increase   Time 8   Period Weeks   Status New   PT LONG TERM GOAL #3   Title Pt will be able to stand for up to an hour with min pain increase   Time 8   Period Weeks   Status New   PT LONG TERM GOAL #4   Title Pt will be able to do stairs without pain increase for community mobility   Time 8   Period Weeks   Status New   PT LONG TERM GOAL #5   Title Hip and core strength improved to 4+/5 needed for standing and walking longer periods of time at work and in the community   Time 8   Period Weeks   Status New   Additional Long Term Goals   Additional Long Term Goals Yes   PT LONG TERM GOAL #6   Title FOTO functional outcome score improved from 53% to 42 % indicating improved function with decreased pain   Time 8   Period Weeks   Status New               Plan - 05/29/15  1147    Clinical Impression Statement Pt looking to add higher intemsity cardio to her regime. We explored the eliptical today which she did 5 min without increasing any pain. She was instructed to increase her time gradually at a level height.    Pt will benefit from skilled therapeutic intervention in order to improve on the following deficits Decreased strength;Improper body mechanics;Postural dysfunction;Difficulty walking;Decreased mobility;Hypermobility;Pain;Increased fascial restricitons   Rehab Potential Good   Clinical Impairments Affecting Rehab Potential None   PT Frequency 2x / week   PT Duration 8 weeks   PT Treatment/Interventions Electrical Stimulation;Cryotherapy;Iontophoresis 4mg /ml Dexamethasone;Ultrasound;Patient/family education;Taping;Dry needling;Neuromuscular re-education;Balance training;Therapeutic exercise;Manual techniques;Other (comment)   PT Next Visit Plan Give sidelying ex for HEP. See how eliptical progression is going.    Consulted and Agree with Plan of Care Patient        Problem List Patient Active Problem List   Diagnosis Date Noted  . Ehlers-Danlos syndrome 05/11/2015  . Sacroiliac joint dysfunction of right side 11/26/2014  . Hypermobility syndrome 11/26/2014    Rogenia Werntz, PTA 05/29/2015, 11:49 AM  [facility] [address], [place], [place], [address] Phone: [phone]   Fax:  [phone]  Name: RONISE SANGUINO MRN: [id number] Date of Birth: 1966/02/02

## 2015-06-01 ENCOUNTER — Encounter: Payer: Self-pay | Admitting: Physical Therapy

## 2015-06-01 ENCOUNTER — Ambulatory Visit: Payer: 59 | Admitting: Physical Therapy

## 2015-06-01 DIAGNOSIS — R293 Abnormal posture: Secondary | ICD-10-CM | POA: Diagnosis not present

## 2015-06-01 DIAGNOSIS — R29898 Other symptoms and signs involving the musculoskeletal system: Secondary | ICD-10-CM

## 2015-06-01 DIAGNOSIS — M532X8 Spinal instabilities, sacral and sacrococcygeal region: Secondary | ICD-10-CM

## 2015-06-01 DIAGNOSIS — M180 Bilateral primary osteoarthritis of first carpometacarpal joints: Secondary | ICD-10-CM | POA: Insufficient documentation

## 2015-06-01 DIAGNOSIS — M545 Low back pain: Secondary | ICD-10-CM | POA: Diagnosis not present

## 2015-06-01 DIAGNOSIS — M62838 Other muscle spasm: Secondary | ICD-10-CM

## 2015-06-01 DIAGNOSIS — Q796 Ehlers-Danlos syndrome, unspecified: Secondary | ICD-10-CM

## 2015-06-01 DIAGNOSIS — M533 Sacrococcygeal disorders, not elsewhere classified: Secondary | ICD-10-CM | POA: Diagnosis not present

## 2015-06-01 DIAGNOSIS — M6281 Muscle weakness (generalized): Secondary | ICD-10-CM | POA: Diagnosis not present

## 2015-06-01 DIAGNOSIS — G5603 Carpal tunnel syndrome, bilateral upper limbs: Secondary | ICD-10-CM | POA: Diagnosis not present

## 2015-06-01 HISTORY — DX: Bilateral primary osteoarthritis of first carpometacarpal joints: M18.0

## 2015-06-01 NOTE — Therapy (Signed)
[facility] [address], [place], [place], [address] Phone: [phone]   Fax:  [phone]  Physical Therapy Treatment  Patient Details  Name: Lindsey [name] MRN: [id number] Date of Birth: 05-23-1965 Referring Provider: Dr. Oneida Alar  Encounter Date: 06/01/2015      PT End of Session - 06/01/15 1101    Visit Number 9   Number of Visits 18   Date for PT Re-Evaluation 06/23/15   Authorization Type [facility] U[facility]   PT Start Time 1016   PT Stop Time 1057   PT Time Calculation (min) 41 min   Activity Tolerance Patient tolerated treatment well   Behavior During Therapy [facility] for tasks assessed/performed      Past Medical History  Diagnosis Date  . Asthma     History reviewed. No pertinent past surgical history.  There were no vitals filed for this visit.  Visit Diagnosis:  Ehlers-Danlos syndrome  Instability of sacroiliac joint  Abnormal posture  Back pain, sacroiliac  Bilateral low back pain, with sciatica presence unspecified  Weakness of back  Muscle spasm      Subjective Assessment - 06/01/15 1043    Subjective Pt reports she was able to work for 12 hours, but was very tired. Pt is now able to be on the eliptical for 9minutes.   Pertinent History Elhers-Danlos Type 3 with crossover with classic   Limitations Sitting;Standing;Walking;House hold activities;Lifting;Other (comment)   How long can you sit comfortably? 10 min   How long can you stand comfortably? better than sit, >10 min    How long can you walk comfortably? 10 min   Diagnostic tests Minimal lumbar spondylosis and facet arthrosis without stenosis on MRI done 05/20/14   Patient Stated Goals be able to establish a plan of exercise for shoulders, hips, knees and ankles.  HEP and safe progression   Currently in Pain? Yes   Pain Score 3    Pain Location Sacrum   Pain Orientation Right;Left;Lower   Pain Descriptors / Indicators Sore   Pain Type  Chronic pain   Pain Onset More than a month ago   Pain Frequency Intermittent   Aggravating Factors  extented work hours,    Pain Relieving Factors not much lately   Effect of Pain on Daily Activities working,     Multiple Pain Sites No                         [facility] Adult PT Treatment/Exercise - 06/01/15 0001    Lumbar Exercises: Aerobic   Elliptical L1 x 10 min   Lumbar Exercises: Supine   Ab Set 15 reps   Heel Slides 5 reps  with TA activation   Bridge 20 reps;3 seconds  with unilateral leg flexion   Lumbar Exercises: Prone   Other Prone Lumbar Exercises Plank 60 sec,  x 40 sec   Other Prone Lumbar Exercises knee bends 10x bil  pushups modified                  PT Short Term Goals - 06/01/15 1125    PT SHORT TERM GOAL #1   Title She will be independent with inital HEP   05/26/15   Time 4   Period Weeks   Status Achieved   PT SHORT TERM GOAL #2   Title She will report pain decrease 25% or more with transitional movements (esp sitting)   Time 4   Period Weeks  Status On-going   PT SHORT TERM GOAL #3   Title Pt will be able to report less pain end of work day (25% less)    Baseline has not worked, starts new job Monday   Time 4   Period Weeks   Status On-going   PT SHORT TERM GOAL #4   Title Pt will understand concepts of stability and posture as it relates to her joint condition.    Time 4   Period Weeks   Status Achieved           PT Long Term Goals - 04/28/15 1725    PT LONG TERM GOAL #1   Title independent with intial and all HEP/ gym program issued as of last visit  06/23/15   Time 8   Period Weeks   Status New   PT LONG TERM GOAL #2   Title Pt  will report pain decreased 50% or more and able to drive to work with minimal pain increase   Time 8   Period Weeks   Status New   PT LONG TERM GOAL #3   Title Pt will be able to stand for up to an hour with min pain increase   Time 8   Period Weeks   Status New   PT LONG TERM  GOAL #4   Title Pt will be able to do stairs without pain increase for community mobility   Time 8   Period Weeks   Status New   PT LONG TERM GOAL #5   Title Hip and core strength improved to 4+/5 needed for standing and walking longer periods of time at work and in the community   Time 8   Period Weeks   Status New   Additional Long Term Goals   Additional Long Term Goals Yes   PT LONG TERM GOAL #6   Title FOTO functional outcome score improved from 53% to 42 % indicating improved function with decreased pain   Time 8   Period Weeks   Status New               Plan - 06/01/15 1102    Clinical Impression Statement Pt able to increase time on Elliptical, and demonstrates good performance with strengthening with modified push ups and planks.    Pt will benefit from skilled therapeutic intervention in order to improve on the following deficits Decreased strength;Improper body mechanics;Postural dysfunction;Difficulty walking;Decreased mobility;Hypermobility;Pain;Increased fascial restricitons   Rehab Potential Good   Clinical Impairments Affecting Rehab Potential None   PT Frequency 2x / week   PT Duration 8 weeks   PT Next Visit Plan Give sidelying ex for HEP. See how eliptical progression is going.    PT Home Exercise Plan supine ab brace; prone multifidi series   Consulted and Agree with Plan of Care Patient        Problem List Patient Active Problem List   Diagnosis Date Noted  . Ehlers-Danlos syndrome 05/11/2015  . Sacroiliac joint dysfunction of right side 11/26/2014  . Hypermobility syndrome 11/26/2014    NAUMANN-HOUEGNIFIO,Lemoyne Nestor PTA 06/01/2015, 11:34 AM  [facility] [address] [place], [place], [address] Phone: [phone]   Fax:  [phone]  Name: Lindsey [name] MRN: [id number] Date of Birth: Aug 05, 1965

## 2015-06-03 NOTE — Progress Notes (Signed)
Patient ID: Lindsey [name], female   DOB: 1965/12/27, 50 y.o.   MRN: [id number]   The script for Tramadol should actually be 100mg  TID For future refills, this is the prescription that needs to be used Per Dr. Oneida Alar

## 2015-06-04 DIAGNOSIS — R21 Rash and other nonspecific skin eruption: Secondary | ICD-10-CM | POA: Diagnosis not present

## 2015-06-04 DIAGNOSIS — N644 Mastodynia: Secondary | ICD-10-CM | POA: Diagnosis not present

## 2015-06-04 MED FILL — MONTELUKAST SOD 10 MG TAB: 10 | 90 days supply | Qty: 90 | Fill #0

## 2015-06-04 MED FILL — GABAPENTIN 300 MG CAPSULE: 300 | 90 days supply | Qty: 90 | Fill #0

## 2015-06-05 DIAGNOSIS — J302 Other seasonal allergic rhinitis: Secondary | ICD-10-CM | POA: Diagnosis not present

## 2015-06-05 DIAGNOSIS — M533 Sacrococcygeal disorders, not elsewhere classified: Secondary | ICD-10-CM | POA: Diagnosis not present

## 2015-06-05 DIAGNOSIS — Z1389 Encounter for screening for other disorder: Secondary | ICD-10-CM | POA: Diagnosis not present

## 2015-06-05 DIAGNOSIS — K589 Irritable bowel syndrome without diarrhea: Secondary | ICD-10-CM | POA: Diagnosis not present

## 2015-06-05 DIAGNOSIS — M549 Dorsalgia, unspecified: Secondary | ICD-10-CM | POA: Diagnosis not present

## 2015-06-05 DIAGNOSIS — Z6822 Body mass index (BMI) 22.0-22.9, adult: Secondary | ICD-10-CM | POA: Diagnosis not present

## 2015-06-05 DIAGNOSIS — J4530 Mild persistent asthma, uncomplicated: Secondary | ICD-10-CM | POA: Diagnosis not present

## 2015-06-05 DIAGNOSIS — Q796 Ehlers-Danlos syndrome: Secondary | ICD-10-CM | POA: Diagnosis not present

## 2015-06-05 DIAGNOSIS — M859 Disorder of bone density and structure, unspecified: Secondary | ICD-10-CM | POA: Diagnosis not present

## 2015-06-10 ENCOUNTER — Ambulatory Visit: Payer: 59 | Attending: Sports Medicine | Admitting: Physical Therapy

## 2015-06-10 ENCOUNTER — Encounter: Payer: Self-pay | Admitting: Physical Therapy

## 2015-06-10 DIAGNOSIS — M545 Low back pain: Secondary | ICD-10-CM | POA: Diagnosis not present

## 2015-06-10 DIAGNOSIS — R293 Abnormal posture: Secondary | ICD-10-CM | POA: Diagnosis not present

## 2015-06-10 DIAGNOSIS — Q796 Ehlers-Danlos syndrome, unspecified: Secondary | ICD-10-CM

## 2015-06-10 DIAGNOSIS — R29898 Other symptoms and signs involving the musculoskeletal system: Secondary | ICD-10-CM

## 2015-06-10 DIAGNOSIS — M62838 Other muscle spasm: Secondary | ICD-10-CM | POA: Insufficient documentation

## 2015-06-10 DIAGNOSIS — M533 Sacrococcygeal disorders, not elsewhere classified: Secondary | ICD-10-CM | POA: Insufficient documentation

## 2015-06-10 DIAGNOSIS — M6281 Muscle weakness (generalized): Secondary | ICD-10-CM | POA: Diagnosis not present

## 2015-06-10 DIAGNOSIS — M532X8 Spinal instabilities, sacral and sacrococcygeal region: Secondary | ICD-10-CM

## 2015-06-10 NOTE — Therapy (Signed)
[facility] [address], [place], [place], [address] Phone: [phone]   Fax:  [phone]  Physical Therapy Treatment  Patient Details  Name: Lindsey [name] MRN: [id number] Date of Birth: 09/22/1965 Referring Provider: Dr. Oneida Alar  Encounter Date: 06/10/2015      PT End of Session - 06/10/15 1519    Visit Number 10   Number of Visits 18   Date for PT Re-Evaluation 06/23/15   Authorization Type [facility] UMR   PT Start Time 1426   PT Stop Time 1520   PT Time Calculation (min) 54 min   Activity Tolerance Patient tolerated treatment well   Behavior During Therapy [facility] for tasks assessed/performed      Past Medical History  Diagnosis Date  . Asthma     History reviewed. No pertinent past surgical history.  There were no vitals filed for this visit.  Visit Diagnosis:  Ehlers-Danlos syndrome  Instability of sacroiliac joint  Abnormal posture  Back pain, sacroiliac  Bilateral low back pain, with sciatica presence unspecified  Weakness of back  Muscle spasm      Subjective Assessment - 06/10/15 1452    Subjective Felt good doing the eliptical last time.    Currently in Pain? No/denies   Multiple Pain Sites No                         [facility] Adult PT Treatment/Exercise - 06/10/15 0001    Lumbar Exercises: Aerobic   Elliptical L1 x 11   Lumbar Exercises: Supine   Straight Leg Raise 3 seconds  2x 5   Lumbar Exercises: Sidelying   Other Sidelying Lumbar Exercises Hip Kick fwd/bkwd 10x, bicycle 6x   Lumbar Exercises: Prone   Other Prone Lumbar Exercises knee bends 10x bil  pushups modified   Lumbar Exercises: Quadruped   Opposite Arm/Leg Raise Right arm/Left leg;Left arm/Right leg;3 seconds  5x bil   Plank Straight arm plank 2 x40 sec                PT Education - 06/10/15 1508    Education provided Yes   Education Details HEP Sidelying kicks   Person(s) Educated Patient   Methods Explanation;Demonstration;Tactile cues;Verbal cues;Handout   Comprehension Verbalized understanding;Returned demonstration          PT Short Term Goals - 06/10/15 1515    PT SHORT TERM GOAL #2   Title She will report pain decrease 25% or more with transitional movements (esp sitting)   Time 4   Period Weeks   Status On-going  20%   PT SHORT TERM GOAL #3   Title Pt will be able to report less pain end of work day (25% less)    Time 4   Period Weeks   Status On-going  20%           PT Long Term Goals - 04/28/15 1725    PT LONG TERM GOAL #1   Title independent with intial and all HEP/ gym program issued as of last visit  06/23/15   Time 8   Period Weeks   Status New   PT LONG TERM GOAL #2   Title Pt  will report pain decreased 50% or more and able to drive to work with minimal pain increase   Time 8   Period Weeks   Status New   PT LONG TERM GOAL #3   Title Pt will be able to stand  for up to an hour with min pain increase   Time 8   Period Weeks   Status New   PT LONG TERM GOAL #4   Title Pt will be able to do stairs without pain increase for community mobility   Time 8   Period Weeks   Status New   PT LONG TERM GOAL #5   Title Hip and core strength improved to 4+/5 needed for standing and walking longer periods of time at work and in the community   Time 8   Period Weeks   Status New   Additional Long Term Goals   Additional Long Term Goals Yes   PT LONG TERM GOAL #6   Title FOTO functional outcome score improved from 53% to 42 % indicating improved function with decreased pain   Time 8   Period Weeks   Status New               Plan - 06/10/15 1516    Clinical Impression Statement Eliptical going well wiht itrs progression. Pt reports that her pain overall is 20% improved, alomst meeting STG of 25%.   Pt will benefit from skilled therapeutic intervention in order to improve on the following deficits Decreased strength;Improper body  mechanics;Postural dysfunction;Difficulty walking;Decreased mobility;Hypermobility;Pain;Increased fascial restricitons   Rehab Potential Good   Clinical Impairments Affecting Rehab Potential None   PT Frequency 2x / week   PT Duration 8 weeks   PT Treatment/Interventions Electrical Stimulation;Cryotherapy;Iontophoresis 4mg /ml Dexamethasone;Ultrasound;Patient/family education;Taping;Dry needling;Neuromuscular re-education;Balance training;Therapeutic exercise;Manual techniques;Other (comment)   PT Next Visit Plan Stabilization   Consulted and Agree with Plan of Care Patient        Problem List Patient Active Problem List   Diagnosis Date Noted  . Ehlers-Danlos syndrome 05/11/2015  . Sacroiliac joint dysfunction of right side 11/26/2014  . Hypermobility syndrome 11/26/2014    [name] , PTA  06/10/2015, 3:21 PM  [facility] [address], [place], [place], [address] Phone: [phone]   Fax:  [phone]  Name: Lindsey [name] MRN: [id number] Date of Birth: [birth year]

## 2015-06-10 NOTE — Patient Instructions (Signed)
Side Kick    Lie on side, back straight along edge of mat, legs 30 in front of torso. Lift top leg to hip height, foot flexed. Exhale, kicking forward twice. Inhale, kicking once backward with pointed foot. Keep leg hip height, torso still. Repeat ____ times. Repeat on other side. Do ____ sessions per day.  http://pm.exer.us/134   Copyright  VHI. All rights reserved.  Side Kick    Lie on side, back straight along edge of mat, legs 30 in front of torso with knee BENT. Lift top leg to hip height, foot flexed. Inhale, kicking forward. Exhale, kicking  backward with pointed foot. Keep leg hip height, torso still. Repeat __10__ times. Repeat on other side. Do __1-2__ sessions per week.  http://pm.exer.us/134   Copyright  VHI. All rights reserved.  Side Bicycle    Lie on side, back straight along edge of mat, legs 30 in front of torso with knee bent.  Lift top leg hip height, knee to chest. Extend leg to front, reach it down and back, then bend and return to chest. Maintain even breathing. Bicycle__6__ times. Repeat with other leg. Do ___1-2_ sessions per week.  http://pm.exer.us/152   Copyright  VHI. All rights reserved.

## 2015-06-15 ENCOUNTER — Encounter: Payer: Self-pay | Admitting: Physical Therapy

## 2015-06-15 ENCOUNTER — Ambulatory Visit: Payer: 59 | Admitting: Physical Therapy

## 2015-06-15 DIAGNOSIS — M533 Sacrococcygeal disorders, not elsewhere classified: Secondary | ICD-10-CM | POA: Diagnosis not present

## 2015-06-15 DIAGNOSIS — R29898 Other symptoms and signs involving the musculoskeletal system: Secondary | ICD-10-CM

## 2015-06-15 DIAGNOSIS — Q796 Ehlers-Danlos syndrome, unspecified: Secondary | ICD-10-CM

## 2015-06-15 DIAGNOSIS — M545 Low back pain: Secondary | ICD-10-CM | POA: Diagnosis not present

## 2015-06-15 DIAGNOSIS — M6281 Muscle weakness (generalized): Secondary | ICD-10-CM | POA: Diagnosis not present

## 2015-06-15 DIAGNOSIS — R293 Abnormal posture: Secondary | ICD-10-CM | POA: Diagnosis not present

## 2015-06-15 DIAGNOSIS — M62838 Other muscle spasm: Secondary | ICD-10-CM

## 2015-06-15 DIAGNOSIS — M532X8 Spinal instabilities, sacral and sacrococcygeal region: Secondary | ICD-10-CM

## 2015-06-15 NOTE — Therapy (Signed)
[facility] [address] [place], [place], [address] Phone: [phone]   Fax:  [phone]  Physical Therapy Treatment  Patient Details  Name: Lindsey [name] MRN: [id number] Date of Birth: December 28, 1965 Referring Provider: Dr. Oneida Alar  Encounter Date: 06/15/2015      PT End of Session - 06/15/15 1106    Visit Number 11   Number of Visits 18   Date for PT Re-Evaluation 06/23/15   Authorization Type [facility] UMR   PT Start Time 1104   PT Stop Time 1142   PT Time Calculation (min) 38 min   Activity Tolerance Patient tolerated treatment well   Behavior During Therapy [facility] for tasks assessed/performed      Past Medical History  Diagnosis Date  . Asthma     History reviewed. No pertinent past surgical history.  There were no vitals filed for this visit.  Visit Diagnosis:  Ehlers-Danlos syndrome  Instability of sacroiliac joint  Abnormal posture  Back pain, sacroiliac  Bilateral low back pain, with sciatica presence unspecified  Weakness of back  Muscle spasm                       [facility] Adult PT Treatment/Exercise - 06/15/15 0001    Lumbar Exercises: Aerobic   Elliptical L 6 x 11 min   Lumbar Exercises: Sidelying   Other Sidelying Lumbar Exercises TA activation 10 bil   Other Sidelying Lumbar Exercises Hip kick fwrd 10x, bicycle 8x, hip abduction 10x   VC/TC on bicylce to extend the knee in hip flexion more                PT Education - 06/15/15 1111    Education provided Yes   Education Details Review of what pt was doing on the [employer] at [employer].   Person(s) Educated Patient   Methods Explanation          PT Short Term Goals - 06/10/15 1515    PT SHORT TERM GOAL #2   Title She will report pain decrease 25% or more with transitional movements (esp sitting)   Time 4   Period Weeks   Status On-going  20%   PT SHORT TERM GOAL #3   Title Pt will be able to report less  pain end of work day (25% less)    Time 4   Period Weeks   Status On-going  20%           PT Long Term Goals - 04/28/15 1725    PT LONG TERM GOAL #1   Title independent with intial and all HEP/ gym program issued as of last visit  06/23/15   Time 8   Period Weeks   Status New   PT LONG TERM GOAL #2   Title Pt  will report pain decreased 50% or more and able to drive to work with minimal pain increase   Time 8   Period Weeks   Status New   PT LONG TERM GOAL #3   Title Pt will be able to stand for up to an hour with min pain increase   Time 8   Period Weeks   Status New   PT LONG TERM GOAL #4   Title Pt will be able to do stairs without pain increase for community mobility   Time 8   Period Weeks   Status New   PT LONG TERM GOAL #5   Title Hip  and core strength improved to 4+/5 needed for standing and walking longer periods of time at work and in the community   Time 8   Period Weeks   Status New   Additional Long Term Goals   Additional Long Term Goals Yes   PT LONG TERM GOAL #6   Title FOTO functional outcome score improved from 53% to 42 % indicating improved function with decreased pain   Time 8   Period Weeks   Status New               Plan - 06/15/15 1106    Clinical Impression Statement Pt with difficulty in LT sidelying performing right hip extension & knee extension, especially coordinating the 2 movements together. This will be her focus with HEP  this week.   Pt will benefit from skilled therapeutic intervention in order to improve on the following deficits Decreased strength;Improper body mechanics;Postural dysfunction;Difficulty walking;Decreased mobility;Hypermobility;Pain;Increased fascial restricitons   Rehab Potential Good   PT Frequency 2x / week   PT Duration 8 weeks   PT Treatment/Interventions Electrical Stimulation;Cryotherapy;Iontophoresis 4mg /ml Dexamethasone;Ultrasound;Patient/family education;Taping;Dry needling;Neuromuscular  re-education;Balance training;Therapeutic exercise;Manual techniques;Other (comment)   PT Next Visit Plan Rt hip extension & abduction strength, RT quad strength   Consulted and Agree with Plan of Care Patient        Problem List Patient Active Problem List   Diagnosis Date Noted  . Ehlers-Danlos syndrome 05/11/2015  . Sacroiliac joint dysfunction of right side 11/26/2014  . Hypermobility syndrome 11/26/2014    Madilynne Mullan, PTA 06/15/2015, 11:40 AM  [facility] [address] [place], [place], [address] Phone: [phone]   Fax:  [phone]  Name: Lindsey [name] MRN: [id number] Date of Birth: [birth year]

## 2015-06-16 ENCOUNTER — Other Ambulatory Visit (HOSPITAL_COMMUNITY): Payer: Self-pay | Admitting: Obstetrics and Gynecology

## 2015-06-16 DIAGNOSIS — N6452 Nipple discharge: Secondary | ICD-10-CM

## 2015-06-17 ENCOUNTER — Encounter: Payer: 59 | Admitting: Physical Therapy

## 2015-06-22 ENCOUNTER — Ambulatory Visit: Payer: 59

## 2015-06-22 DIAGNOSIS — G5601 Carpal tunnel syndrome, right upper limb: Secondary | ICD-10-CM | POA: Diagnosis not present

## 2015-06-22 DIAGNOSIS — M6281 Muscle weakness (generalized): Secondary | ICD-10-CM | POA: Diagnosis not present

## 2015-06-22 DIAGNOSIS — Q796 Ehlers-Danlos syndrome, unspecified: Secondary | ICD-10-CM

## 2015-06-22 DIAGNOSIS — M9903 Segmental and somatic dysfunction of lumbar region: Secondary | ICD-10-CM | POA: Diagnosis not present

## 2015-06-22 DIAGNOSIS — M545 Low back pain: Secondary | ICD-10-CM

## 2015-06-22 DIAGNOSIS — M533 Sacrococcygeal disorders, not elsewhere classified: Secondary | ICD-10-CM

## 2015-06-22 DIAGNOSIS — M9902 Segmental and somatic dysfunction of thoracic region: Secondary | ICD-10-CM | POA: Diagnosis not present

## 2015-06-22 DIAGNOSIS — R293 Abnormal posture: Secondary | ICD-10-CM

## 2015-06-22 DIAGNOSIS — R29898 Other symptoms and signs involving the musculoskeletal system: Secondary | ICD-10-CM

## 2015-06-22 DIAGNOSIS — M62838 Other muscle spasm: Secondary | ICD-10-CM | POA: Diagnosis not present

## 2015-06-22 DIAGNOSIS — M9901 Segmental and somatic dysfunction of cervical region: Secondary | ICD-10-CM | POA: Diagnosis not present

## 2015-06-22 DIAGNOSIS — M791 Myalgia: Secondary | ICD-10-CM | POA: Diagnosis not present

## 2015-06-22 DIAGNOSIS — M532X8 Spinal instabilities, sacral and sacrococcygeal region: Secondary | ICD-10-CM

## 2015-06-22 NOTE — Therapy (Addendum)
[facility] [address] [place], [place], [address] Phone: [phone]   Fax:  [phone]  Physical Therapy Treatment  Patient Details  Name: Lindsey [name] MRN: [id number] Date of Birth: 1965-05-08 Referring Provider: Dr. Oneida Alar  Encounter Date: 06/22/2015      PT End of Session - 06/22/15 1136    Visit Number 12   Date for PT Re-Evaluation 08/07/15   PT Start Time 1056   PT Stop Time 1136   PT Time Calculation (min) 40 min   Activity Tolerance Patient tolerated treatment well   Behavior During Therapy [facility] for tasks assessed/performed      Past Medical History  Diagnosis Date  . Asthma     History reviewed. No pertinent past surgical history.  There were no vitals filed for this visit.  Visit Diagnosis:  Ehlers-Danlos syndrome - Plan: PT plan of care cert/re-cert  Instability of sacroiliac joint - Plan: PT plan of care cert/re-cert  Abnormal posture - Plan: PT plan of care cert/re-cert  Back pain, sacroiliac - Plan: PT plan of care cert/re-cert  Bilateral low back pain, with sciatica presence unspecified - Plan: PT plan of care cert/re-cert  Weakness of back - Plan: PT plan of care cert/re-cert      Subjective Assessment - 06/22/15 1100    Subjective Continues to do core exercises including pilates.  Worked 2, 12 hour shifts over the weekend.   Pertinent History Elhers-Danlos Type 3 with crossover with classic   Patient Stated Goals be able to establish a plan of exercise for shoulders, hips, knees and ankles.  HEP and safe progression   Currently in Pain? 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; horseback ride   Cognition   Overall Cognitive Status Within Functional Limits for tasks assessed   Observation/Other Assessments   Focus on Therapeutic Outcomes (FOTO)  43% limitaiton   Strength   Strength Assessment Site Lumbar   Right Hip Extension 4+/5   Right Hip ABduction 4/5   Left Hip Extension 4/5   Left Hip ABduction 5/5                     [facility] Adult PT Treatment/Exercise - 06/22/15 0001    Lumbar Exercises: Aerobic   Elliptical L 6 x 8 min   Lumbar Exercises: Prone   Straight Leg Raise 5 reps                PT Education - 06/22/15 1135    Education provided Yes   Education Details Review of gym, home and pilates exercise program.  PT suggested gentle LE strength exercises including standing wall sits and isometrics for hips   Person(s) Educated Patient   Comprehension Verbalized understanding;Returned demonstration          PT Short Term Goals -  06/10/15 1515    PT SHORT TERM GOAL #2   Title She will report pain decrease 25% or more with transitional movements (esp sitting)   Time 4   Period Weeks   Status On-going  20%   PT SHORT TERM GOAL #3   Title Pt will be able to report less pain end of work day (25% less)    Time 4   Period Weeks   Status On-going  20%           PT Long Term Goals - 06/22/15 1103    PT LONG TERM GOAL #1   Title independent with intial and all HEP/ gym program issued as of last visit  06/23/15   Time 6   Period Weeks   Status On-going   PT LONG TERM GOAL #2   Title Pt  will report pain decreased 50% or more and able to drive to work with minimal pain increase   Time 6   Period Weeks   Status On-going  10% improvement   PT LONG TERM GOAL #3   Title Pt will be  able to stand for up to an hour with min pain increase   Time 6   Period Weeks   Status On-going  30 minutes to 1 hour without standing against wall   PT LONG TERM GOAL #4   Title Pt will be able to do stairs without pain increase for community mobility   Time 6   Status On-going  pain immediately   PT LONG TERM GOAL #5   Title Hip and core strength improved to 4+/5 needed for standing and walking longer periods of time at work and in the community   Time 6   PT LONG TERM GOAL #6   Title FOTO functional outcome score improved from 53% to 42 % indicating improved function with decreased pain   Time 6   Period Weeks   Status New  43% limitation               Plan - 06/22/15 1117    Clinical Impression Statement Pt is making steady yet slow gains with PT.  Pt reports 10% overall pain reduction wiht driving long periods.  Pt able to standing at work for 30 minutes-1 hour without lumbar support.  She can stand against the wall for 2-3 hours at work.  Pt has been progressing with pilates and overall strength gains slowly.  Pt wih continued Rt>Lt hip weakness.  Pt reports limitaitons with extended walking and is not able to work shifts that require long walking.  Pt will continue to benefit from skilled PT for 1x/wk to advance HEP with safety and allow for return to long distance walking and regular work tasks.     Pt will benefit from skilled therapeutic intervention in order to improve on the following deficits Decreased strength;Improper body mechanics;Postural dysfunction;Difficulty walking;Decreased mobility;Hypermobility;Pain;Increased fascial restricitons   Rehab Potential Good   PT Frequency 2x / week   PT Duration 8 weeks   PT Treatment/Interventions Electrical Stimulation;Cryotherapy;Iontophoresis 38m/ml Dexamethasone;Ultrasound;Patient/family education;Taping;Dry needling;Neuromuscular re-education;Balance training;Therapeutic exercise;Manual techniques;Other (comment)   PT  Next Visit Plan Rt hip extension & abduction strength, RT quad strength.  Core strength progression.  1x/wk for advancement of current HEP   Consulted and Agree with Plan of Care Patient        Problem List Patient Active Problem List   Diagnosis Date Noted  . Ehlers-Danlos syndrome 05/11/2015  . Sacroiliac joint dysfunction of right  side 11/26/2014  . Hypermobility syndrome 11/26/2014    Oluwadamilare Tobler, PT 06/22/2015, 11:38 AM PHYSICAL THERAPY DISCHARGE SUMMARY  Visits from Start of Care: 12  Current functional level related to goals / functional outcomes: See above for most current status.  Pt didn't return after reassessment complete 06/22/15.     Remaining deficits: See above for most current status.     Education / Equipment: Gym exercises, HEP, body mechanics Plan: Patient agrees to discharge.  Patient goals were partially met. Patient is being discharged due to not returning since the last visit.  ?????    Sigurd Sos, PT 08/04/2015 12:46 PM  [facility] [address], [place], [place], [address] Phone: [phone]   Fax:  [phone]  Name: Lindsey [name] MRN: [id number] Date of Birth: [birth year]

## 2015-06-24 ENCOUNTER — Ambulatory Visit: Payer: 59 | Admitting: Cardiovascular Disease

## 2015-06-24 ENCOUNTER — Ambulatory Visit (INDEPENDENT_AMBULATORY_CARE_PROVIDER_SITE_OTHER): Payer: 59 | Admitting: Physician Assistant

## 2015-06-24 VITALS — BP 130/84 | HR 96 | Temp 99.7°F | Resp 18 | Ht 66.0 in | Wt 145.4 lb

## 2015-06-24 DIAGNOSIS — R05 Cough: Secondary | ICD-10-CM

## 2015-06-24 DIAGNOSIS — R509 Fever, unspecified: Secondary | ICD-10-CM

## 2015-06-24 DIAGNOSIS — Z8709 Personal history of other diseases of the respiratory system: Secondary | ICD-10-CM

## 2015-06-24 DIAGNOSIS — R059 Cough, unspecified: Secondary | ICD-10-CM

## 2015-06-24 LAB — POCT INFLUENZA A/B
INFLUENZA A, POC: NEGATIVE
INFLUENZA B, POC: NEGATIVE

## 2015-06-24 MED ORDER — OSELTAMIVIR PHOSPHATE 75 MG PO CAPS: 75.0000 mg | ORAL_CAPSULE | Freq: Two times a day (BID) | ORAL | Status: DC

## 2015-06-24 MED ORDER — IPRATROPIUM BROMIDE 0.02 % IN SOLN
0.5000 mg | Freq: Once | RESPIRATORY_TRACT | Status: AC
  Administered 2015-06-24: 0.5 mg via RESPIRATORY_TRACT

## 2015-06-24 MED ORDER — ALBUTEROL SULFATE (2.5 MG/3ML) 0.083% IN NEBU
2.5000 mg | INHALATION_SOLUTION | Freq: Once | RESPIRATORY_TRACT | Status: AC
  Administered 2015-06-24: 2.5 mg via RESPIRATORY_TRACT

## 2015-06-24 MED ORDER — BENZONATATE 200 MG PO CAPS: 200.0000 mg | ORAL_CAPSULE | Freq: Two times a day (BID) | ORAL | Status: DC | PRN

## 2015-06-24 MED ORDER — HYDROCODONE-HOMATROPINE 5-1.5 MG/5ML PO SYRP
5.0000 mL | ORAL_SOLUTION | Freq: Three times a day (TID) | ORAL | Status: DC | PRN
Start: 2015-06-24 — End: 2015-07-19

## 2015-06-24 NOTE — Progress Notes (Signed)
06/24/2015 6:34 PM   DOB: 06-28-1965 / MRN: [id number]  SUBJECTIVE:  Lindsey [name] is a 50 y.o. female presenting for cough and wheezing that started yesterday.  She has a history of asthma. She complains of fever and myalgia today, and has taken Tylenol with good symptomatic relief.  She has a Ehlers-Danlos which was recently diagnosed and is under the care of a geneticist at [facility].     She is allergic to poison oak extract; penicillins; and sulfa antibiotics.   She  has a past medical history of Asthma.    She  reports that she has never smoked. She has never used smokeless tobacco. She reports that she does not drink alcohol or use illicit drugs. She  has no sexual activity history on file. The patient  has no past surgical history on file.  Her family history includes Hypertension in her father and mother; Stroke in her father.  Review of Systems  Constitutional: Positive for malaise/fatigue. Negative for fever, chills and diaphoresis.  HENT: Positive for congestion and sore throat.   Respiratory: Positive for cough. Negative for hemoptysis, shortness of breath and wheezing.   Cardiovascular: Negative for chest pain.  Gastrointestinal: Negative for nausea.  Musculoskeletal: Positive for myalgias.  Skin: Negative for rash.  Neurological: Negative for dizziness, weakness and headaches.  Endo/Heme/Allergies: Negative for polydipsia.    Problem list and medications reviewed and updated by myself where necessary, and exist elsewhere in the encounter.   OBJECTIVE:  BP 130/84 mmHg  Pulse 96  Temp(Src) 99.7 F (37.6 C) (Oral)  Resp 18  Ht 5\' 6"  (1.676 m)  Wt 145 lb 6.4 oz (65.953 kg)  BMI 23.48 kg/m2  SpO2 98%  Physical Exam  Constitutional: She is oriented to person, place, and time.  HENT:  Right Ear: External ear normal.  Left Ear: External ear normal.  Nose: Mucosal edema present. Right sinus exhibits no maxillary sinus tenderness and no frontal sinus tenderness.  Left sinus exhibits no maxillary sinus tenderness and no frontal sinus tenderness.  Mouth/Throat: Oropharynx is clear and moist. No oropharyngeal exudate.  Eyes: Conjunctivae are normal. Pupils are equal, round, and reactive to light.  Cardiovascular: Regular rhythm and normal heart sounds.   Pulmonary/Chest: Effort normal and breath sounds normal. She has no wheezes. She has no rales.  Neurological: She is alert and oriented to person, place, and time.  Skin: Skin is warm and dry. No rash noted. She is not diaphoretic. No erythema.  Psychiatric: Her behavior is normal.    Results for orders placed or performed in visit on 06/24/15 (from the past 72 hour(s))  POCT Influenza A/B     Status: None   Collection Time: 06/24/15  6:32 PM  Result Value Ref Range   Influenza A, POC Negative Negative   Influenza B, POC Negative Negative    No results found.  ASSESSMENT AND PLAN  Lindsey [name] was seen today for cough and shortness of breath.  Diagnoses and all orders for this visit:  Cough: Flu is negative, however she has a history of asthma and her symptoms are consistent. Doubt pneumonia given exam and constellation of symptoms.  Will treat for flu.  Follow up in 3 days if not improved, sooner if worse.    History of asthma -     albuterol (PROVENTIL) (2.5 MG/3ML) 0.083% nebulizer solution 2.5 mg; Take 3 mLs (2.5 mg total) by nebulization once. -     ipratropium (ATROVENT) nebulizer solution 0.5 mg; Take  2.5 mLs (0.5 mg total) by nebulization once.  Febrile illness, acute -     POCT Influenza A/B -     HYDROcodone-homatropine (HYCODAN) 5-1.5 MG/5ML syrup; Take 5 mLs by mouth every 8 (eight) hours as needed for cough. -     benzonatate (TESSALON) 200 MG capsule; Take 1 capsule (200 mg total) by mouth 2 (two) times daily as needed for cough. -     oseltamivir (TAMIFLU) 75 MG capsule; Take 1 capsule (75 mg total) by mouth 2 (two) times daily.    The patient was advised to call or return to  clinic if she does not see an improvement in symptoms or to seek the care of the closest emergency department if she worsens with the above plan.   Philis Fendt, MHS, PA-C [facility] 06/24/2015 6:34 PM

## 2015-06-24 NOTE — Patient Instructions (Signed)
     IF you received an x-ray today, you will receive an invoice from [facility]. Please contact [facility] at [phone] with questions or concerns regarding your invoice.   IF you received labwork today, you will receive an invoice from [facility]. Please contact [facility] at [phone] with questions or concerns regarding your invoice.   Our billing staff will not be able to assist you with questions regarding bills from these companies.  You will be contacted with the lab results as soon as they are available. The fastest way to get your results is to activate your My Chart account. Instructions are located on the last page of this paperwork. If you have not heard from us regarding the results in 2 weeks, please contact this office.      

## 2015-06-25 ENCOUNTER — Ambulatory Visit (HOSPITAL_COMMUNITY): Admission: RE | Admit: 2015-06-25 | Payer: 59 | Source: Ambulatory Visit

## 2015-06-28 ENCOUNTER — Encounter: Payer: Self-pay | Admitting: Sports Medicine

## 2015-06-30 MED FILL — traMADol HCL 50 MG TABS: 50 | 30 days supply | Qty: 90 | Fill #1

## 2015-07-01 ENCOUNTER — Encounter: Payer: Self-pay | Admitting: Urgent Care

## 2015-07-01 ENCOUNTER — Other Ambulatory Visit: Payer: Self-pay

## 2015-07-01 ENCOUNTER — Ambulatory Visit (HOSPITAL_COMMUNITY)
Admission: RE | Admit: 2015-07-01 | Discharge: 2015-07-01 | Disposition: A | Discharge status: Home / Self Care | Payer: 59 | Source: Ambulatory Visit | Attending: Obstetrics and Gynecology | Admitting: Obstetrics and Gynecology

## 2015-07-01 DIAGNOSIS — N6452 Nipple discharge: Secondary | ICD-10-CM | POA: Insufficient documentation

## 2015-07-01 DIAGNOSIS — K7689 Other specified diseases of liver: Secondary | ICD-10-CM | POA: Insufficient documentation

## 2015-07-01 DIAGNOSIS — N644 Mastodynia: Secondary | ICD-10-CM | POA: Diagnosis not present

## 2015-07-01 DIAGNOSIS — T148XXD Other injury of unspecified body region, subsequent encounter: Secondary | ICD-10-CM

## 2015-07-01 DIAGNOSIS — T148XXA Other injury of unspecified body region, initial encounter: Secondary | ICD-10-CM

## 2015-07-01 MED ORDER — GADOBENATE DIMEGLUMINE 529 MG/ML IV SOLN
15.0000 mL | Freq: Once | INTRAVENOUS | Status: AC | PRN
  Administered 2015-07-01: 13 mL via INTRAVENOUS

## 2015-07-01 NOTE — Telephone Encounter (Signed)
Lindsey [name], pharm req'd RF of mupirocin ointment two days after her OV with you. It was first Rxd by [place] last spring d/t itching related to poison sumac. I was going to deny and have pt come in for eval, but since it was just after your OV, wanted to check to make sure you hadn't discussed this med w/pt at [facility].

## 2015-07-02 ENCOUNTER — Other Ambulatory Visit: Payer: Self-pay | Admitting: *Deleted

## 2015-07-02 MED ORDER — TRAMADOL HCL 50 MG PO TABS: ORAL_TABLET | ORAL | Status: DC

## 2015-07-02 MED ORDER — MUPIROCIN 2 % EX OINT: 1.0000 "application " | TOPICAL_OINTMENT | Freq: Three times a day (TID) | CUTANEOUS | Status: DC

## 2015-07-02 NOTE — Telephone Encounter (Signed)
It looks like Elmyra Ricks refilled this.  If it does not go through I am fine with signing for refills.  Philis Fendt, MS, PA-C 9:00 PM, 07/02/2015

## 2015-07-03 MED FILL — MUPIROCIN 2% OINTMENT: 2 | 10 days supply | Qty: 22 | Fill #0

## 2015-07-07 ENCOUNTER — Ambulatory Visit (INDEPENDENT_AMBULATORY_CARE_PROVIDER_SITE_OTHER): Payer: 59 | Admitting: Neurology

## 2015-07-07 ENCOUNTER — Ambulatory Visit (INDEPENDENT_AMBULATORY_CARE_PROVIDER_SITE_OTHER): Payer: Self-pay | Admitting: Neurology

## 2015-07-07 ENCOUNTER — Encounter: Payer: Self-pay | Admitting: Neurology

## 2015-07-07 DIAGNOSIS — G5601 Carpal tunnel syndrome, right upper limb: Secondary | ICD-10-CM

## 2015-07-07 DIAGNOSIS — N644 Mastodynia: Secondary | ICD-10-CM | POA: Diagnosis not present

## 2015-07-07 DIAGNOSIS — R202 Paresthesia of skin: Secondary | ICD-10-CM

## 2015-07-07 DIAGNOSIS — Z23 Encounter for immunization: Secondary | ICD-10-CM | POA: Diagnosis not present

## 2015-07-07 DIAGNOSIS — G5600 Carpal tunnel syndrome, unspecified upper limb: Secondary | ICD-10-CM

## 2015-07-07 HISTORY — DX: Carpal tunnel syndrome, unspecified upper limb: G56.00

## 2015-07-07 NOTE — Progress Notes (Signed)
Please refer to EMG and nerve conduction study procedure note. 

## 2015-07-07 NOTE — Procedures (Signed)
     HISTORY:  Lindsey [name] is a 50 year old patient with a history of numbness and discomfort in the hands bilaterally, right greater than left, since October 2016. The patient is being evaluated for a possible neuropathy or a cervical radiculopathy. She denies any neck discomfort.  NERVE CONDUCTION STUDIES:  Nerve conduction studies were performed on both upper extremities. The distal motor latencies for the median nerves were prolonged on the right, normal on the left, with normal motor amplitudes for these nerves bilaterally. The distal motor latencies and motor amplitudes for the ulnar nerves were normal bilaterally. The F wave latencies and nerve conduction velocities for the median and ulnar nerves were normal bilaterally. The sensory latencies for the median nerves were prolonged on the right, normal on the left, and normal for the ulnar nerves bilaterally.  EMG STUDIES:  EMG study was performed on the right upper extremity:  The first dorsal interosseous muscle reveals 2 to 4 K units with full recruitment. No fibrillations or positive waves were noted. The abductor pollicis brevis muscle reveals 2 to 4 K units with full recruitment. No fibrillations or positive waves were noted. The extensor indicis proprius muscle reveals 1 to 3 K units with full recruitment. No fibrillations or positive waves were noted. The pronator teres muscle reveals 2 to 3 K units with full recruitment. No fibrillations or positive waves were noted. The biceps muscle reveals 1 to 2 K units with full recruitment. No fibrillations or positive waves were noted. The triceps muscle reveals 2 to 4 K units with full recruitment. No fibrillations or positive waves were noted. The anterior deltoid muscle reveals 2 to 3 K units with full recruitment. No fibrillations or positive waves were noted. The cervical paraspinal muscles were tested at 2 levels. No abnormalities of insertional activity were seen at either level  tested. There was good relaxation.  EMG study was performed on the left upper extremity:  The first dorsal interosseous muscle reveals 2 to 4 K units with full recruitment. No fibrillations or positive waves were noted. The abductor pollicis brevis muscle reveals 2 to 4 K units with full recruitment. No fibrillations or positive waves were noted. The extensor indicis proprius muscle reveals 1 to 3 K units with full recruitment. No fibrillations or positive waves were noted. The pronator teres muscle reveals 2 to 3 K units with full recruitment. No fibrillations or positive waves were noted. The biceps muscle reveals 1 to 2 K units with full recruitment. No fibrillations or positive waves were noted. The triceps muscle reveals 2 to 4 K units with full recruitment. No fibrillations or positive waves were noted. The anterior deltoid muscle reveals 2 to 3 K units with full recruitment. No fibrillations or positive waves were noted. The cervical paraspinal muscles were tested at 2 levels. No abnormalities of insertional activity were seen at either level tested. There was fair relaxation.   IMPRESSION:  Nerve conduction studies done on the upper extremities shows evidence of a mild right carpal tunnel syndrome. No other significant abnormalities were seen. EMG evaluation of both upper extremities were unremarkable, no evidence of an overlying cervical radiculopathy is seen on either side.  Jill Alexanders MD 07/07/2015 11:00 AM  [facility] [address] [place], [place] [address]  Phone [phone] Fax [phone]

## 2015-07-10 DIAGNOSIS — G5603 Carpal tunnel syndrome, bilateral upper limbs: Secondary | ICD-10-CM | POA: Diagnosis not present

## 2015-07-13 MED FILL — PAZEO 0.7% EYE DROPS: 0.7 | 13 days supply | Qty: 3 | Fill #3

## 2015-07-16 ENCOUNTER — Ambulatory Visit: Payer: 59 | Admitting: Family Medicine

## 2015-07-19 ENCOUNTER — Ambulatory Visit (INDEPENDENT_AMBULATORY_CARE_PROVIDER_SITE_OTHER): Payer: 59 | Admitting: Family Medicine

## 2015-07-19 VITALS — BP 104/68 | HR 64 | Temp 98.0°F | Resp 16 | Ht 66.0 in | Wt 140.4 lb

## 2015-07-19 DIAGNOSIS — Q796 Ehlers-Danlos syndrome, unspecified: Secondary | ICD-10-CM

## 2015-07-19 DIAGNOSIS — R5383 Other fatigue: Secondary | ICD-10-CM | POA: Diagnosis not present

## 2015-07-19 DIAGNOSIS — N6012 Diffuse cystic mastopathy of left breast: Secondary | ICD-10-CM | POA: Diagnosis not present

## 2015-07-19 DIAGNOSIS — F518 Other sleep disorders not due to a substance or known physiological condition: Secondary | ICD-10-CM | POA: Diagnosis not present

## 2015-07-19 DIAGNOSIS — G4723 Circadian rhythm sleep disorder, irregular sleep wake type: Secondary | ICD-10-CM

## 2015-07-19 DIAGNOSIS — M357 Hypermobility syndrome: Secondary | ICD-10-CM

## 2015-07-19 DIAGNOSIS — R799 Abnormal finding of blood chemistry, unspecified: Secondary | ICD-10-CM | POA: Diagnosis not present

## 2015-07-19 DIAGNOSIS — N6011 Diffuse cystic mastopathy of right breast: Secondary | ICD-10-CM

## 2015-07-19 DIAGNOSIS — R7989 Other specified abnormal findings of blood chemistry: Secondary | ICD-10-CM | POA: Diagnosis not present

## 2015-07-19 HISTORY — DX: Diffuse cystic mastopathy of right breast: N60.11

## 2015-07-19 LAB — THYROID PANEL WITH TSH
FREE THYROXINE INDEX: 3.8 (ref 1.4–3.8)
T3 UPTAKE: 36 % — AB (ref 22–35)
T4 TOTAL: 10.5 ug/dL (ref 4.5–12.0)
TSH: 1.02 mIU/L

## 2015-07-19 LAB — IBC PANEL
%SAT: 25 % (ref 11–50)
TIBC: 288 ug/dL (ref 250–450)
UIBC: 215 ug/dL (ref 125–400)

## 2015-07-19 LAB — FERRITIN: Ferritin: 45 ng/mL (ref 10–232)

## 2015-07-19 LAB — IRON: Iron: 73 ug/dL (ref 40–190)

## 2015-07-19 MED ORDER — LEVOCETIRIZINE DIHYDROCHLORIDE 5 MG PO TABS: 5.0000 mg | ORAL_TABLET | Freq: Every evening | ORAL | Status: DC

## 2015-07-19 NOTE — Patient Instructions (Addendum)
I would recommend discussing the breast genetic testing with the [facility] geneticist you are going to see in June.  They would be able to make sure the correct tests are ordered and interpreted. If you have any trouble though or want to get the tests in advance to bring the results with you to that appointment, that is fine.  I would probably recommend calling [facility] or [place] to see about getting this testing done.  I am happy to order it on your behalf if you let me know where you want it done or have orders faxed to me for this.  IF you received an x-ray today, you will receive an invoice from [facility]. Please contact [facility] at [phone] with questions or concerns regarding your invoice.   IF you received labwork today, you will receive an invoice from [employer]. Please contact [facility] at [phone] with questions or concerns regarding your invoice.   Our billing staff will not be able to assist you with questions regarding bills from these companies.  You will be contacted with the lab results as soon as they are available. The fastest way to get your results is to activate your My Chart account. Instructions are located on the last page of this paperwork. If you have not heard from [place] regarding the results in 2 weeks, please contact this office.    BRCA-1 and BRCA-2 Testing BRCA-1 and BRCA-2 are genes that make proteins that help repair damaged cells. BRCA-1 and BRCA-2 testing is done to see if there is a mutation in either of these genes. If there is a mutation, the genes may not be able to help repair damaged cells. As a result, the cells may develop defects that can lead to certain types of cancer. You may have this test if you have a family history of certain types of cancer, including cancer of the:  Breast.  Fallopian tubes.  Ovaries.  Peritoneum. The test requires either a sample of blood or a sample of  the cells from the inside of your cheek. If a sample of blood is taken, it will be drawn from a vein in your arm using a thin needle. If a sample of cells is taken, you will get instructions on how to use a rinse to collect the sample. RESULTS It is your responsibility to obtain your test results. Ask the lab or the department doing the test when and how you will get the results. Contact your health care provider if you have any questions about your results. The lab test results can show whether:  You do not have a mutation in the BRCA-1 or BRCA-2 gene that increases your risk for certain cancers.  You have a mutation in the BRCA-1 or BRCA-2 gene that increases your risk for certain cancers.  You have a mutation in the BRCA-1 or BRCA-2 gene that has not been found to increase your risk for certain cancers. Meaning of Negative Test Results A negative test result means that you do not have a mutation in the BRCA-1 or BRCA-2 gene that is known to increase your risk for certain cancers. This does not mean you will never get cancer. Talk to your health care provider or genetic counselor about what this result means for you. Meaning of Positive Test Results A positive test result means that you do have a mutation in the BRCA-1 or BRCA-2 gene that increases your risk for certain cancers. Women with a positive  test result have an increased risk for ovarian cancer. Both women and men with a mutation have an increased risk for breast cancer and may be at greater risk for other types of cancer. Getting a positive test result does not mean you will develop cancer.  You may be told you are a carrier. This means you can pass the mutation to your children.  Talk to your health care provider or genetic counselor about what this result means for you. Meaning of Ambiguous Test Results Ambiguous, inconclusive, or uncertain test results mean there is a change in the BRCA-1 or BRCA-2 gene, but this change has not been  linked to cancer. Talk to your health care provider or genetic counselor about what this result means for you.   This information is not intended to replace advice given to you by your health care provider. Make sure you discuss any questions you have with your health care provider.   Document Released: 04/21/2004 Document Revised: 04/18/2014 Document Reviewed: 06/27/2013 Elsevier Interactive Patient Education [employer].

## 2015-07-19 NOTE — Progress Notes (Signed)
Subjective:    Patient ID: Lindsey [name], female    DOB: [birth year], 50 y.o.   MRN: [id number] By signing my name below, I, Lindsey [name], attest that this documentation has been prepared under the direction and in the presence of [name], MD. Electronically Signed: Judithe [name], ER Scribe. 07/19/2015. 1:55 PM.  Chief Complaint  Patient presents with  . Establish Care    HPI HPI Comments: Lindsey [name] is a 50 y.o. menopausal female with a hx of Ehlers-Danlos Syndrome, carpal tunnel syndrome and allergies who presents to [facility] to establish care with me.  She is currently menopausal and has not had a period since January. She has severe hot flashes, and has been taking 450mg  of black cohosh which has improved her sx. She is also suffering from metal fogginess and increased fatigue which she associates with being menopausal. She is struggling at work due to her fatigue sx. She is also having trouble sleeping due to her rotating work schedule. Her work schedule will regularize in may, but until that time she will continue to have an erratic work schedule. She is supposed to go to physical therapy several times per week for sx related to Ehlers-Danlos syndrome, but she has not been able to due to her work schedule. She has a past hx of multiple subluxations in her hips.Her last surgery was gum surgery in 2005. She is planning on having Gum surgery again in the near future due to receding gums.   She has extremely dense breast tissue and pt adament that mammograms do not show differentiation between normal tissue and tumors. She went through multiple forms of imaging and biopsy last year that were all normal. Her breasts are regularly painful and she has regular nipple discharge. She is considering having a prophylactic double mastectomy She has previously noted increased healing times after injury or illness, which she associates with Ehlers-Danlos Syndrome.   She also suffers from carpal tunnel  sx. She sees Dr. Fredna Dow for that. She has no feeling in the tips of her fingers.   She also had a past diagnosis of hemochromatosis.  She sees Dr.Fields for her Ehlers-Danlos Syndrome.   She is having some foot pain. She is going to see a pediatrist for that issue.  She also reports that her last LDL dropped from 160 to 130, but she was concerned that over the same period her platelets dropped from the 400s to the 200s and her white blood cell count increased from 40 to 50.   Past Medical History  Diagnosis Date  . Asthma   . Carpal tunnel syndrome 07/07/2015    right   Allergies  Allergen Reactions  . Poison Oak Extract [Extract Of Poison Oak] Anaphylaxis  . Penicillins   . Sulfa Antibiotics    Current Outpatient Prescriptions on File Prior to Visit  Medication Sig Dispense Refill  . Albuterol Sulfate 108 (90 BASE) MCG/ACT AEPB Inhale into the lungs.    . gabapentin (NEURONTIN) 300 MG capsule Take 1 capsule (300 mg total) by mouth at bedtime. 30 capsule 3  . hyoscyamine (LEVBID) 0.375 MG 12 hr tablet Take 0.375 mg by mouth 2 (two) times daily.    . meloxicam (MOBIC) 15 MG tablet Take 1 tablet (15 mg total) by mouth daily. 30 tablet 3  . montelukast (SINGULAIR) 10 MG tablet Take 10 mg by mouth at bedtime.    . mupirocin ointment (BACTROBAN) 2 % Apply 1 application topically 3 (three)  times daily. 22 g 1  . traMADol (ULTRAM) 50 MG tablet Take 2 tabs three times a day 180 tablet 3  . traZODone (DESYREL) 50 MG tablet Take 2-3 tabs po QHS for sleep. 270 tablet 3   No current facility-administered medications on file prior to visit.   No past surgical history on file. Family History  Problem Relation Age of Onset  . Hypertension Mother   . Stroke Father   . Hypertension Father    Social History   Social History  . Marital Status: Single    Spouse Name: N/A  . Number of Children: N/A  . Years of Education: N/A   Social History Main Topics  . Smoking status: Never Smoker     . Smokeless tobacco: Never Used  . Alcohol Use: No  . Drug Use: No  . Sexual Activity: Not Asked   Other Topics Concern  . None   Social History Narrative   Depression screen [facility] 2/9 07/19/2015 06/24/2015 05/04/2015 04/17/2015 03/24/2015  Decreased Interest 0 0 0 0 0  Down, Depressed, Hopeless 0 0 0 0 0  PHQ - 2 Score 0 0 0 0 0     Review of Systems  Constitutional: Positive for diaphoresis and fatigue. Negative for fever and chills.  HENT: Positive for congestion, dental problem, postnasal drip, rhinorrhea, sinus pressure and sneezing.   Genitourinary: Negative for vaginal bleeding and menstrual problem.  Musculoskeletal: Positive for myalgias, back pain, joint swelling, arthralgias and gait problem.  Neurological: Positive for weakness and numbness.  Hematological: Negative for adenopathy.  Psychiatric/Behavioral: Positive for confusion, sleep disturbance and decreased concentration. Negative for behavioral problems, dysphoric mood and agitation. The patient is not nervous/anxious.        Objective:  BP 104/68 mmHg  Pulse 64  Temp(Src) 98 F (36.7 C) (Oral)  Resp 16  Ht 5\' 6"  (1.676 m)  Wt 140 lb 6.4 oz (63.685 kg)  BMI 22.67 kg/m2  SpO2 98%  LMP 04/08/2014  Physical Exam  Constitutional: She is oriented to person, place, and time. She appears well-developed and well-nourished. No distress.  HENT:  Head: Normocephalic and atraumatic.  Eyes: Pupils are equal, round, and reactive to light.  Neck: Neck supple.  Cardiovascular: Normal rate.   Pulmonary/Chest: Effort normal. No respiratory distress.  Musculoskeletal: Normal range of motion.  Neurological: She is alert and oriented to person, place, and time. Coordination normal.  Skin: Skin is warm and dry. She is not diaphoretic.  Psychiatric: She has a normal mood and affect. Her behavior is normal.  Nursing note and vitals reviewed.     Assessment & Plan:   1. Ehlers-Danlos syndrome   2. Fibrocystic breast changes  of both breasts   3. Other fatigue   4. Hypermobility syndrome   5. Irregular sleep-wake rhythm, nonorganic origin   6. Abnormal blood finding     Orders Placed This Encounter  Procedures  . Ferritin  . IBC Panel  . Iron  . Thyroid Panel With TSH    Meds ordered this encounter  Medications  . levocetirizine (XYZAL) 5 MG tablet    Sig: Take 1 tablet (5 mg total) by mouth every evening.    Dispense:  30 tablet    Refill:  2    I personally performed the services described in this documentation, which was scribed in my presence. The recorded information has been reviewed and considered, and addended by me as needed.  [name], MD MPH  Results for orders placed  or performed in visit on 07/19/15  Ferritin  Result Value Ref Range   Ferritin 45 10 - 232 ng/mL  IBC Panel  Result Value Ref Range   UIBC 215 125 - 400 ug/dL   TIBC 288 250 - 450 ug/dL   %SAT 25 11 - 50 %  Iron  Result Value Ref Range   Iron 73 40 - 190 ug/dL  Thyroid Panel With TSH  Result Value Ref Range   T4, Total 10.5 4.5 - 12.0 ug/dL   T3 Uptake 36 (H) 22 - 35 %   Free Thyroxine Index 3.8 1.4 - 3.8   TSH 1.02 mIU/L

## 2015-07-20 MED FILL — LEVOCETIRIZINE 5 MG TABLET: 5 | 30 days supply | Qty: 30 | Fill #0

## 2015-07-28 MED FILL — traMADol HCL 50 MG TABS: 50 | 30 days supply | Qty: 180 | Fill #0

## 2015-07-29 ENCOUNTER — Encounter: Payer: Self-pay | Admitting: Sports Medicine

## 2015-07-31 MED FILL — VENLAFAXINE HCL ER 37.5 MG: 37.5 | 30 days supply | Qty: 30 | Fill #0

## 2015-08-05 MED FILL — MELOXICAM 15 MG TABLET: 15 | 90 days supply | Qty: 90 | Fill #2

## 2015-08-06 DIAGNOSIS — N644 Mastodynia: Secondary | ICD-10-CM | POA: Diagnosis not present

## 2015-08-10 ENCOUNTER — Encounter: Payer: Self-pay | Admitting: Family Medicine

## 2015-08-11 ENCOUNTER — Ambulatory Visit (INDEPENDENT_AMBULATORY_CARE_PROVIDER_SITE_OTHER): Payer: 59

## 2015-08-11 ENCOUNTER — Ambulatory Visit (INDEPENDENT_AMBULATORY_CARE_PROVIDER_SITE_OTHER): Payer: 59 | Admitting: Podiatry

## 2015-08-11 DIAGNOSIS — M2010 Hallux valgus (acquired), unspecified foot: Secondary | ICD-10-CM | POA: Diagnosis not present

## 2015-08-11 DIAGNOSIS — M722 Plantar fascial fibromatosis: Secondary | ICD-10-CM

## 2015-08-11 NOTE — Progress Notes (Signed)
   Subjective:    Patient ID: Lindsey [name], female    DOB: 01-01-1966, 50 y.o.   MRN: [id number]  HPI: Lindsey [name] presents today with a chief complaint of painful bunions bilaterally. She states that she continues to workout on a regular basis and has started to develop irritation along the medial aspect of the first metatarsophalangeal joint bilateral left greater than right. She states that her bunions have increased so much that she started to get breakdown and tearing of the sides of her shoes. She has a history of Ehlers-Danlos particularly of the ankle still less degree digits of the forefoot. She is also a Software engineer for [employer]. She would like to also consider orthotics and discussed surgical intervention regarding the bunions.    Review of Systems  All other systems reviewed and are negative.      Objective:   Physical Exam: Vital signs are stable she is alert and oriented 3. No apparent distress. Pulses are strongly palpable. Neurologic sensorium is intact. Deep tendon reflexes are intact. Also strength +5 over 5 dorsiflexion and plantar flexors and inverters everters alter the musculature is intact. Orthopedic evaluation and x-rays all joints distal to the ankle for range of motion without crepitation. Rectus foot type is noted. Increase in the first intermetatarsal angle consistent with hallux abductovalgus deformity bilateral. Left greater than right. She is tracking but not yet track bound is slightly hypermobile at the first metatarsal medial cuneiform joint but not major at this point. Radiographs do demonstrate an elevated first metatarsal and increase in the first intermetatarsal angle with dislocation of the first metatarsophalangeal joint of the left foot but to a lesser degree on the right foot. Cutaneous evaluation demonstrates mild erythema overlying the first metatarsophalangeal joints left greater than right no other major osseous abnormalities or cutaneous abnormalities are  noted.        Assessment & Plan:  Assessment: Hallux abductovalgus deformity bilateral. Left greater than right. History of Ehlers-Danlos.  Plan: Discussed etiology pathology conservative versus surgical therapies. Due to the capsulitis of the hallux valgus deformity with going to get her into a pair of orthotics. I will follow up with her once does come in.

## 2015-08-12 DIAGNOSIS — M180 Bilateral primary osteoarthritis of first carpometacarpal joints: Secondary | ICD-10-CM | POA: Diagnosis not present

## 2015-08-12 DIAGNOSIS — G5603 Carpal tunnel syndrome, bilateral upper limbs: Secondary | ICD-10-CM | POA: Diagnosis not present

## 2015-08-14 ENCOUNTER — Other Ambulatory Visit: Payer: Self-pay | Admitting: Orthopedic Surgery

## 2015-08-14 ENCOUNTER — Other Ambulatory Visit: Payer: Self-pay

## 2015-08-14 MED ORDER — LEVOCETIRIZINE DIHYDROCHLORIDE 5 MG PO TABS: 5.0000 mg | ORAL_TABLET | Freq: Every evening | ORAL | Status: DC

## 2015-08-14 MED FILL — LEVOCETIRIZINE 5 MG TABLET: 5 | 90 days supply | Qty: 90 | Fill #0

## 2015-08-17 MED FILL — PAZEO 0.7% EYE DROPS: 0.7 | 13 days supply | Qty: 3 | Fill #4

## 2015-08-19 ENCOUNTER — Encounter: Payer: Self-pay | Admitting: Family Medicine

## 2015-08-20 ENCOUNTER — Telehealth: Payer: Self-pay | Admitting: Genetic Counselor

## 2015-08-20 ENCOUNTER — Telehealth: Payer: Self-pay | Admitting: *Deleted

## 2015-08-20 NOTE — Telephone Encounter (Signed)
Lt mess regarding genetics referral and appt date and time

## 2015-08-20 NOTE — Telephone Encounter (Signed)
Pt called and left a message on my voicemail about getting scheduled for genetics.  Left a message for [facility] in [facility] and sent her an inbasket to reach out to the pt about scheduling.

## 2015-08-21 DIAGNOSIS — L814 Other melanin hyperpigmentation: Secondary | ICD-10-CM | POA: Diagnosis not present

## 2015-08-21 DIAGNOSIS — D045 Carcinoma in situ of skin of trunk: Secondary | ICD-10-CM | POA: Diagnosis not present

## 2015-08-21 DIAGNOSIS — D1801 Hemangioma of skin and subcutaneous tissue: Secondary | ICD-10-CM | POA: Diagnosis not present

## 2015-08-21 DIAGNOSIS — D225 Melanocytic nevi of trunk: Secondary | ICD-10-CM | POA: Diagnosis not present

## 2015-08-21 DIAGNOSIS — L700 Acne vulgaris: Secondary | ICD-10-CM | POA: Diagnosis not present

## 2015-08-21 DIAGNOSIS — D485 Neoplasm of uncertain behavior of skin: Secondary | ICD-10-CM | POA: Diagnosis not present

## 2015-08-24 ENCOUNTER — Other Ambulatory Visit: Payer: Self-pay | Admitting: Family Medicine

## 2015-08-24 ENCOUNTER — Telehealth: Payer: Self-pay | Admitting: Genetic Counselor

## 2015-08-24 DIAGNOSIS — J4520 Mild intermittent asthma, uncomplicated: Secondary | ICD-10-CM | POA: Diagnosis not present

## 2015-08-24 DIAGNOSIS — H1045 Other chronic allergic conjunctivitis: Secondary | ICD-10-CM | POA: Diagnosis not present

## 2015-08-24 DIAGNOSIS — J300 Vasomotor rhinitis: Secondary | ICD-10-CM | POA: Diagnosis not present

## 2015-08-24 MED ORDER — GABAPENTIN 100 MG PO CAPS: ORAL_CAPSULE | ORAL | Status: DC

## 2015-08-24 MED FILL — GABAPENTIN 100 MG CAPSULE: 100 | 14 days supply | Qty: 21 | Fill #0

## 2015-08-24 NOTE — Telephone Encounter (Signed)
Called Lindsey [name] to inquire about reason for referral.  She was in touch with our scheduler, Aaron Edelman.  Who confirmed that she is indeed being referred for hereditary cancer testing.

## 2015-08-25 ENCOUNTER — Ambulatory Visit (HOSPITAL_BASED_OUTPATIENT_CLINIC_OR_DEPARTMENT_OTHER): Payer: 59 | Admitting: Genetic Counselor

## 2015-08-25 ENCOUNTER — Other Ambulatory Visit: Payer: 59

## 2015-08-25 DIAGNOSIS — Z800 Family history of malignant neoplasm of digestive organs: Secondary | ICD-10-CM | POA: Diagnosis not present

## 2015-08-25 DIAGNOSIS — Z808 Family history of malignant neoplasm of other organs or systems: Secondary | ICD-10-CM | POA: Diagnosis not present

## 2015-08-25 DIAGNOSIS — Z8042 Family history of malignant neoplasm of prostate: Secondary | ICD-10-CM

## 2015-08-25 DIAGNOSIS — Z803 Family history of malignant neoplasm of breast: Secondary | ICD-10-CM

## 2015-08-25 DIAGNOSIS — Z315 Encounter for genetic counseling: Secondary | ICD-10-CM

## 2015-08-26 ENCOUNTER — Encounter: Payer: Self-pay | Admitting: Genetic Counselor

## 2015-08-26 DIAGNOSIS — Z803 Family history of malignant neoplasm of breast: Secondary | ICD-10-CM

## 2015-08-26 HISTORY — DX: Family history of malignant neoplasm of breast: Z80.3

## 2015-08-26 NOTE — Progress Notes (Signed)
REFERRING PROVIDER: Shawnee Knapp, MD [address] [place], [place] [address]  PRIMARY PROVIDER:  [name], MD  PRIMARY REASON FOR VISIT:  1. Family history of breast cancer in female   2. Family history- stomach cancer   3. Family history of colon cancer   4. Family history of prostate cancer   5. Family history of skin cancer      HISTORY OF PRESENT ILLNESS:   [name], a 50 y.o. female, was seen for a [place] cancer genetics consultation at the request of Dr. Brigitte Pulse due to a family history of breast, stomach, and other cancers.  Ms. Tino presents to clinic today to discuss the possibility of a hereditary predisposition to cancer, genetic testing, and to further clarify her future cancer risks, as well as potential cancer risks for family members.    [name] is a 50 y.o. female with no personal history of cancer.    HORMONAL RISK FACTORS:  Menarche was at age 17.  First live birth at age - no children.  OCP use for approximately 24 years.  Ovaries intact: yes.  Hysterectomy: no.  Menopausal status: postmenopausal.  HRT use: 0 years. Colonoscopy: no; not examined. Mammogram within the last year: yes, has been receiving breast MRIs due to high breast tissue density. Number of breast biopsies: hx of benign biopsies. Up to date with pelvic exams:  yes. Any excessive radiation exposure in the past:  no  Past Medical History  Diagnosis Date  . Asthma   . Carpal tunnel syndrome 07/07/2015    right    No past surgical history on file.  Social History   Social History  . Marital Status: Single    Spouse Name: N/A  . Number of Children: N/A  . Years of Education: N/A   Social History Main Topics  . Smoking status: Never Smoker   . Smokeless tobacco: Never Used  . Alcohol Use: No  . Drug Use: No  . Sexual Activity: Not on file   Other Topics Concern  . Not on file   Social History Narrative     FAMILY HISTORY:  We obtained a detailed, 4-generation family  history.  Significant diagnoses are listed below: Family History  Problem Relation Age of Onset  . Hypertension Mother   . Other Mother     hx of hysterectomy at 60-56 for prolapsed uterus; hx of benign L arm cyst in her 45s  . Stroke Father   . Hypertension Father   . Skin cancer Father 46    NOS  . Other Father     enlarged prostate s/p surgery  . Alzheimer's disease Maternal Aunt   . Stomach cancer Paternal Aunt     d. 80; was a Manufacturing engineer and did not go to the doctor, so not sure age of onset  . Congestive Heart Failure Maternal Grandmother   . Prostate cancer Maternal Grandfather     dx. 75-76  . Heart Problems Paternal Grandmother   . Heart Problems Paternal Grandfather   . Colon cancer Other     maternal great aunt (MGM's sister) dx in her 70s  . Heart attack Paternal Uncle 49  . Heart Problems Paternal Uncle   . Angina Paternal Uncle   . Breast cancer Cousin     paternal 1st cousin d. 20  . Breast cancer Cousin     paternal 1st cousin dx. 75s    [name] has one full brother who is currently 29 and  has a history of pre-cancerous skin findings and mole removals.  Her brother also has no children.  [name] mother is currently 67 and has never had cancer.  She does have a history of a hysterectomy in her mid-50s to treat a prolapsed uterus and a history of a benign cyst removed from her left arm in her 27s.  [name]'s father passed away at 30.  He had a history of skin cancer, NOS type diagnosed at age 22 and a history of an enlarged prostate which was treated with surgery.    [name] mother had one full sister who died of alzheimer's-related illness at age 60.  She has one son and one daughter who are both cancer-free.  [name] maternal grandmother died of congestive heart failure at 52.  She had approximately 12 siblings--one of her sisters was diagnosed with colon cancer in her 40s.  [name] maternal grandfather was diagnosed with prostate  cancer at age 52-76 and he passed away at 78.  He also had approximately 12 sibling, some of whom were smokers and had a history of lung cancer.    [name]'s father had two full sisters and three full brothers.  One sister died of an unspecified cause at only 53 days of age.  One brother was stillborn. His other sister died of stomach cancer at the age of 58.  One brother died of heart problems and angina at 24.  One brother died of a heart attack at 7.  This latter brother had two daughters and two sons.  His two daughters died of breast cancer in their 72s.  [name] maternal grandmother died of heart problems at 29.  Her grandfather died of heart problems at 41.  [name] had no further information for any paternal great aunts/uncles or great grandparents.  She is unaware of any previous family history of genetic testing for hereditary cancer.  Patient's maternal ancestors are of Caucasian/English and Native American/[place] descent, and paternal ancestors are of Caucasian/Irish and English descent. There is no reported Ashkenazi Jewish ancestry. There is no known consanguinity.  GENETIC COUNSELING ASSESSMENT: [name] is a 50 y.o. female with a family history of cancer. We, therefore, discussed and recommended the following at today's visit.   DISCUSSION: We reviewed the characteristics, features and inheritance patterns of hereditary cancer syndromes. We discussed that only about 5-10% of cancers are genetic in origin and that for these cancers we can often pick up on certain "red flags" in someone's personal and/or family history of cancer that makes [place] more suspicious for a genetic cause.  These red flags include early age of cancer onset, history of multiple cancer diagnoses in multiple individuals and in multiple generations of the family, and multiple primary cancers for an individuals, among others.  [name] family history is somewhat reassuring that most likely the cancer  history is sporadic in origin.  Thus, we discussed with [name] that the family history is not highly consistent with a familial hereditary cancer syndrome, and we feel she is at low risk to harbor a gene mutation associated with such a condition.    Ms. Schrupp is still concerned about her family history of cancer and her cancer risks, so we discussed self-pay genetic testing options, including those offered through [facility] and [facility].  In addition to the breast cancer risk genes, Ms. Mcallister is further interested in testing for GI cancer risk genes.  We discussed the  [facility] and [facility] testing options in detail, and Ms. Koppel would like to pursue self-pay testing through [facility] Common Hereditary Cancers Panel (Breast, Gyn, GI).  The 42-gene [facility] Common Hereditary Cancers Panel (Breast, Gyn, GI) performed by [facility], [place]) includes sequencing and/or deletion/duplication analysis for the following genes: APC, ATM, AXIN2, BARD1, BMPR1A, BRCA1, BRCA2, BRIP1, CDH1, CDKN2A, CHEK2, DICER1, EPCAM, GREM1, KIT, MEN1, MLH1, MSH2, MSH6, MUTYH, NBN, NF1, PALB2, PDGFRA, PMS2, POLD1, POLE, PTEN, RAD50, RAD51C, RAD51D, SDHA, SDHB, SDHC, SDHD, SMAD4, SMARCA4, STK11, TP53, TSC1, TSC2, and VHL.  Ms. Konkel will be charged $475 for genetic testing through [facility].  She provided her consent and blood sample for testing and she will be sent an invoice from the lab.    PLAN: After considering the risks, benefits, and limitations, Ms. Krack  provided informed consent to pursue genetic testing and the blood sample was sent to [facility] for analysis of the 42-gene [facility] Common Hereditary Cancers Panel (Breast, Gyn, GI). Results should be available within approximately 2-3 weeks' time, at which point they will be disclosed by telephone to Ms. Meda Coffee, as will any additional recommendations warranted by these results.  Ms. Goller will receive a summary of her genetic counseling visit and a copy of her results once available. This information will also be available in Epic. We encouraged Ms. Preast to remain in contact with cancer genetics annually so that we can continuously update the family history and inform her of any changes in cancer genetics and testing that may be of benefit for her family. Ms. Pat questions were answered to her satisfaction today. Our contact information was provided should additional questions or concerns arise.  Thank you for the referral and allowing [place] to share in the care of your patient.   Jeanine Luz, MS, [facility] Certified Genetic Counselor [email or web address] phone: [phone]  The patient was seen for a total of 70 minutes in face-to-face genetic counseling.  This patient was discussed with Drs. Magrinat, Lindi Adie and/or Burr Medico who agrees with the above.    _______________________________________________________________________ For Office Staff:  Number of people involved in session: 1 Was an Intern/ student involved with case: no

## 2015-08-28 ENCOUNTER — Telehealth: Payer: Self-pay | Admitting: Internal Medicine

## 2015-08-28 MED FILL — VENLAFAXINE HCL ER 37.5 MG: 37.5 | 30 days supply | Qty: 30 | Fill #1

## 2015-08-28 NOTE — Telephone Encounter (Signed)
LMTCB

## 2015-08-28 NOTE — Telephone Encounter (Signed)
Got a letter from Dr Donneta Romberg dated 08/24/15 that he recommended Lindsey [name] see me for eval. Pls cal and give first avail if patient interested

## 2015-09-01 DIAGNOSIS — Z23 Encounter for immunization: Secondary | ICD-10-CM | POA: Diagnosis not present

## 2015-09-01 NOTE — Telephone Encounter (Signed)
lmtcb X2 for pt.  

## 2015-09-02 ENCOUNTER — Telehealth: Payer: Self-pay

## 2015-09-02 ENCOUNTER — Other Ambulatory Visit: Payer: Self-pay | Admitting: Allergy

## 2015-09-02 ENCOUNTER — Ambulatory Visit
Admission: RE | Admit: 2015-09-02 | Discharge: 2015-09-02 | Disposition: A | Discharge status: Home / Self Care | Payer: 59 | Source: Ambulatory Visit | Attending: Allergy | Admitting: Allergy

## 2015-09-02 DIAGNOSIS — J4520 Mild intermittent asthma, uncomplicated: Secondary | ICD-10-CM

## 2015-09-02 DIAGNOSIS — R0989 Other specified symptoms and signs involving the circulatory and respiratory systems: Secondary | ICD-10-CM | POA: Diagnosis not present

## 2015-09-03 DIAGNOSIS — D225 Melanocytic nevi of trunk: Secondary | ICD-10-CM | POA: Diagnosis not present

## 2015-09-03 DIAGNOSIS — D045 Carcinoma in situ of skin of trunk: Secondary | ICD-10-CM | POA: Diagnosis not present

## 2015-09-04 NOTE — Telephone Encounter (Signed)
lmtcb X3 for pt.  Letter mailed.  Will close message per triage protocol.

## 2015-09-08 ENCOUNTER — Ambulatory Visit (HOSPITAL_BASED_OUTPATIENT_CLINIC_OR_DEPARTMENT_OTHER): Admit: 2015-09-08 | Payer: 59 | Admitting: Orthopedic Surgery

## 2015-09-08 ENCOUNTER — Telehealth: Payer: Self-pay | Admitting: Genetic Counselor

## 2015-09-08 ENCOUNTER — Encounter (HOSPITAL_BASED_OUTPATIENT_CLINIC_OR_DEPARTMENT_OTHER): Payer: Self-pay

## 2015-09-08 SURGERY — CARPAL TUNNEL RELEASE
Anesthesia: Regional | Site: Wrist | Laterality: Right

## 2015-09-08 MED FILL — traMADol HCL 50 MG TABS: 50 | 30 days supply | Qty: 180 | Fill #1

## 2015-09-08 MED FILL — MONTELUKAST SOD 10 MG TAB: 10 | 90 days supply | Qty: 90 | Fill #1

## 2015-09-09 ENCOUNTER — Ambulatory Visit: Payer: Self-pay | Admitting: Genetic Counselor

## 2015-09-09 DIAGNOSIS — Z803 Family history of malignant neoplasm of breast: Secondary | ICD-10-CM

## 2015-09-09 DIAGNOSIS — Z1379 Encounter for other screening for genetic and chromosomal anomalies: Secondary | ICD-10-CM

## 2015-09-09 DIAGNOSIS — Z809 Family history of malignant neoplasm, unspecified: Secondary | ICD-10-CM

## 2015-09-09 NOTE — Telephone Encounter (Signed)
Discussed with Lindsey [name] that her genetic test result was negative for any known pathogenic mutations within any of 42 genes on the [facility] Common Hereditary Cancers Panel (Breast, Gyn, GI) through [facility] that would increase her genetic risk for any of these or related cancers.  One uncertain change was found in the POLD1 gene.  We reviewed that we treat this just like a negative test result and reviewed why we do that. Encouraged her to keep her phone number up-to-date with [place], so that we can call and let her know if/when this result gets updated by the lab in the future.  She should continue to follow her doctors recommendations for future cancer screening.  Overall, this is a reassuring result and it seems most likely that the family history of cancer is not genetic but more likely to be sporadic.  Discussed that Tyrer-Cuzick/IBIS and Baker Janus risk models were run and demonstrated that her lifetime breast cancer risk is approximately 12.4-12.9%, which is very similar to that of the general population risk for breast cancer.  Discussed that hormone exposure does increase breast cancer risks, but, as hormone use can be of benefit to her due to other health issues, that this must be taken into consideration as well.  Recommended she discuss further with her doctor.  She would like a copy of her results emailed to her, and I am happy to do that.  She is welcome to call with any questions.

## 2015-09-10 DIAGNOSIS — Z6822 Body mass index (BMI) 22.0-22.9, adult: Secondary | ICD-10-CM | POA: Diagnosis not present

## 2015-09-10 DIAGNOSIS — N951 Menopausal and female climacteric states: Secondary | ICD-10-CM | POA: Diagnosis not present

## 2015-09-10 DIAGNOSIS — Z01419 Encounter for gynecological examination (general) (routine) without abnormal findings: Secondary | ICD-10-CM | POA: Diagnosis not present

## 2015-09-10 MED FILL — ESTRADIOL 0.05 MG PATCH: 0.05 | 28 days supply | Qty: 8 | Fill #0

## 2015-09-10 MED FILL — PROGESTERONE 100 MG CAPSULE: 100 | 30 days supply | Qty: 30 | Fill #0

## 2015-09-11 DIAGNOSIS — H52223 Regular astigmatism, bilateral: Secondary | ICD-10-CM | POA: Diagnosis not present

## 2015-09-15 ENCOUNTER — Ambulatory Visit: Payer: 59 | Admitting: *Deleted

## 2015-09-15 DIAGNOSIS — M2010 Hallux valgus (acquired), unspecified foot: Secondary | ICD-10-CM

## 2015-09-15 NOTE — Patient Instructions (Signed)

## 2015-09-15 NOTE — Progress Notes (Signed)
Patient ID: Lindsey [name], female   DOB: 09-27-1965, 50 y.o.   MRN: [id number] Patient presents for orthotic pick up.  Verbal and written break in and wear instructions given.  Patient will follow up in 4 weeks if symptoms worsen or fail to improve.

## 2015-09-17 ENCOUNTER — Telehealth: Payer: Self-pay | Admitting: Neurology

## 2015-09-17 NOTE — Telephone Encounter (Signed)
Faxed Medical records to Dr. Jimmye Norman ofc

## 2015-09-21 DIAGNOSIS — Z1379 Encounter for other screening for genetic and chromosomal anomalies: Secondary | ICD-10-CM | POA: Insufficient documentation

## 2015-09-21 NOTE — Progress Notes (Signed)
GENETIC TEST RESULT  HPI: Ms. Nader was previously seen in the [facility] clinic due to a family history of breast and other cancers and personal concerns regarding a hereditary predisposition to cancer. Please refer to our prior cancer genetics clinic note from Aug 25, 2015 for more information regarding Ms. Abate's medical, social and family histories, and our assessment and recommendations, at the time. Ms. Bridwell recent genetic test results were disclosed to her, as were recommendations warranted by these results. These results and recommendations are discussed in more detail below.  GENETIC TEST RESULTS: At the time of Ms. Willmon's visit, we recommended she pursue genetic testing of the 42-gene [facility] Common Hereditary Cancers Panel (Breast, Gyn, GI) through [facility].  The 42-gene [facility] Common Hereditary Cancers Panel (Breast, Gyn, GI) performed by [facility], [place]) includes sequencing and/or deletion/duplication analysis for the following genes: APC, ATM, AXIN2, BARD1, BMPR1A, BRCA1, BRCA2, BRIP1, CDH1, CDKN2A, CHEK2, DICER1, EPCAM, GREM1, KIT, MEN1, MLH1, MSH2, MSH6, MUTYH, NBN, NF1, PALB2, PDGFRA, PMS2, POLD1, POLE, PTEN, RAD50, RAD51C, RAD51D, SDHA, SDHB, SDHC, SDHD, SMAD4, SMARCA4, STK11, TP53, TSC1, TSC2, and VHL.  Those results are now back, the report date for which is Sep 04, 2015.  Genetic testing was normal, and did not reveal a deleterious mutation in these genes.  One variant of uncertain significance (VUS) was found in the POLD1 gene.  The test report will be scanned into EPIC and will be located under the Results Review tab in the Pathology>Molecular Pathology section.   Genetic testing did identify a variant of uncertain significance (VUS) called "c.563T>G (p.Leu188Arg)" in one copy of the POLD1 gene. At this time, it is unknown if this VUS is associated with an increased risk for cancer or if this is a normal finding. Since this  VUS result is uncertain, it cannot help guide screening recommendations, and family members should not be tested for this VUS to help define their own cancer risks.  Also, we all have variants within our genes that make [place] unique individuals--most of these variants are benign.  Thus, we treat this VUS as a negative result.   With time, we suspect the lab will reclassify this variant and when they do, we will try to re-contact Ms. Benning to discuss the reclassification further.  We also encouraged Ms. Shugart to contact us in a year or two to obtain an update on the status of this VUS.  We discussed with Ms. Straub that since the current genetic testing is not perfect, it is possible there may be a gene mutation in one of these genes that current testing cannot detect, but that chance is small. We also discussed, that it is possible that another gene that has not yet been discovered, or that we have not yet tested, is responsible for the cancer diagnoses in the family, and it is, therefore, important to remain in touch with cancer genetics in the future so that we can continue to offer Ms. Castleman the most up-to-date genetic testing.   CANCER SCREENING RECOMMENDATIONS: This normal result is reassuring and indicates that Ms. Stenglein does not likely have an increased risk of cancer due to a mutation in one of these genes.  We, therefore, recommended  Ms. Consolo continue to follow the cancer screening guidelines provided by her primary healthcare providers.   RECOMMENDATIONS FOR FAMILY MEMBERS: Women in this family might be at some increased risk of developing cancer, over the general population risk, simply due to the  family history of cancer. We recommended women in this family have a yearly mammogram beginning at age 6, or 78 years younger than the earliest onset of cancer, an annual clinical breast exam, and perform monthly breast self-exams. Women in this family should also have a gynecological exam as  recommended by their primary provider. All family members should have a colonoscopy by age 72.   Family members should not have genetic testing for this VUS outside of a research setting, as it has no implications for their medical management.    FOLLOW-UP: Lastly, we discussed with Ms. Mesmer that cancer genetics is a rapidly advancing field and it is possible that new genetic tests will be appropriate for her and/or her family members in the future. We encouraged her to remain in contact with cancer genetics on an annual basis so we can update her personal and family histories and let her know of advances in cancer genetics that may benefit this family.   Our contact number was provided. Ms. Shuart questions were answered to her satisfaction, and she knows she is welcome to call us at anytime with additional questions or concerns.   Jeanine Luz, MS, [facility] Certified Genetic Counselor [email or web address] Phone: [phone]

## 2015-09-22 ENCOUNTER — Encounter: Payer: Self-pay | Admitting: Family Medicine

## 2015-09-22 DIAGNOSIS — Z880 Allergy status to penicillin: Secondary | ICD-10-CM | POA: Diagnosis not present

## 2015-09-22 DIAGNOSIS — Z882 Allergy status to sulfonamides status: Secondary | ICD-10-CM | POA: Diagnosis not present

## 2015-09-22 DIAGNOSIS — M2480 Other specific joint derangements of unspecified joint, not elsewhere classified: Secondary | ICD-10-CM | POA: Diagnosis not present

## 2015-09-22 MED FILL — diazePAM 5 MG TABS: 5 | 2 days supply | Qty: 3 | Fill #0

## 2015-09-22 MED FILL — AZITHROMYCIN 250 MG TABLET: 250 | 5 days supply | Qty: 6 | Fill #0

## 2015-09-23 ENCOUNTER — Ambulatory Visit (INDEPENDENT_AMBULATORY_CARE_PROVIDER_SITE_OTHER): Payer: 59 | Admitting: Family Medicine

## 2015-09-23 VITALS — BP 118/76 | HR 77 | Temp 97.4°F | Resp 15 | Ht 66.0 in | Wt 140.0 lb

## 2015-09-23 DIAGNOSIS — R1084 Generalized abdominal pain: Secondary | ICD-10-CM | POA: Diagnosis not present

## 2015-09-23 DIAGNOSIS — Q796 Ehlers-Danlos syndrome, unspecified: Secondary | ICD-10-CM

## 2015-09-23 DIAGNOSIS — M357 Hypermobility syndrome: Secondary | ICD-10-CM

## 2015-09-23 DIAGNOSIS — M533 Sacrococcygeal disorders, not elsewhere classified: Secondary | ICD-10-CM | POA: Diagnosis not present

## 2015-09-23 DIAGNOSIS — R110 Nausea: Secondary | ICD-10-CM | POA: Diagnosis not present

## 2015-09-23 LAB — POCT CBC
Granulocyte percent: 60 %G (ref 37–80)
HEMATOCRIT: 40.4 % (ref 37.7–47.9)
HEMOGLOBIN: 14.6 g/dL (ref 12.2–16.2)
LYMPH, POC: 1.4 (ref 0.6–3.4)
MCH, POC: 31.2 pg (ref 27–31.2)
MCHC: 36.2 g/dL — AB (ref 31.8–35.4)
MCV: 86.2 fL (ref 80–97)
MID (cbc): 0.4 (ref 0–0.9)
MPV: 7.4 fL (ref 0–99.8)
POC GRANULOCYTE: 2.8 (ref 2–6.9)
POC LYMPH PERCENT: 30.4 %L (ref 10–50)
POC MID %: 9.6 % (ref 0–12)
Platelet Count, POC: 237 10*3/uL (ref 142–424)
RBC: 4.68 M/uL (ref 4.04–5.48)
RDW, POC: 12.7 %
WBC: 4.6 10*3/uL (ref 4.6–10.2)

## 2015-09-23 LAB — POC MICROSCOPIC URINALYSIS (UMFC): Mucus: ABSENT

## 2015-09-23 LAB — POCT URINALYSIS DIP (MANUAL ENTRY)
Bilirubin, UA: NEGATIVE
Blood, UA: NEGATIVE
GLUCOSE UA: NEGATIVE
Ketones, POC UA: NEGATIVE
Leukocytes, UA: NEGATIVE
NITRITE UA: NEGATIVE
PROTEIN UA: NEGATIVE
Spec Grav, UA: 1.01
UROBILINOGEN UA: 0.2
pH, UA: 7

## 2015-09-23 MED ORDER — ONDANSETRON 4 MG PO TBDP
8.0000 mg | ORAL_TABLET | Freq: Once | ORAL | Status: AC
  Administered 2015-09-23: 8 mg via ORAL

## 2015-09-23 MED ORDER — GI COCKTAIL ~~LOC~~
30.0000 mL | Freq: Once | ORAL | Status: AC
  Administered 2015-09-23: 30 mL via ORAL

## 2015-09-23 NOTE — Progress Notes (Addendum)
By signing my name below, I, [name], attest that this documentation has been prepared under the direction and in the presence of [name], MD.  Electronically Signed: [name], Medical Scribe. 09/23/2015. 12:22 PM.  Subjective:    Patient ID: Lindsey [name], female    DOB: 24-Jan-1966, 50 y.o.   MRN: [id number]  HPI   Chief Complaint  Patient presents with  . Nausea    x 2weeks     HPI Comments: Lindsey [name] is a 49 y.o. female with a PMHx of EDS who presents to the [facility] complaining of nausea onset 4 months ago. Pt states her nausea would occur at night 4 months ago, and now she's had continuous nausea for the past 3 weeks. Pt thinks her symptoms are from her pain medication. Pt takes 100 mg of tramadol BID for pain management. Pt takes 2 50 mg of tramadol with breakfast, and 2 with a meal in the evening. Pt mentions when the tramadol is out of her system she's still nauseated. Pt has tried OTC femodene to help her nausea, but didn't help. Pt hasn't seen a GI doctor for her nausea. Pt denies emesis, heart burn, indigestion, bloat, beltching, and passing gas. Pt denies any abdominal surgeries. 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   EDS/Restrictions: Pt saw Dr. Mare Loan at [facility], in the children's wing yesterday for official dx of EDS. Pt had a neurokinetic study done recently and thinks this is going to work for her symptoms. Pt needs to go back to PCP for any other restriction, but the geneticist will not because they just dx, not give out prescriptions. Pt is getting frustrated with her providers not working together. Pt needs accomodations at work. Pt needs limited working and she's only had walking shifts. [facility] hasn't been honoring her restrictions, making her condition worse. Pt needs everything done by August. Pt understands she can't get her accommodations over night. Pt has to do PT, weight lifting, and pilates weekly to help her symptoms. Pt  states the piliates helps but she could never schedule an appt due to her schedule.Pt states she can't sit in a chair for a long time. When she sits in a car for a long time she feels like she's been beat up. Pt feels like if she does PT before she comes into work she's feel better. Pt can't work with a brace on because she's not using her back muscles. Pt states she can't not work and disability takes too long to kick in. Pt needs the .8 FTE because it makes it easier to plan events of appts to get better. Pt would like to schedule an appt with Dr. Brigitte Pulse, but wanted to come in today to speak with Dr. Brigitte Pulse before her appt was set up.  Pt states EDS started Jan 2016. Pt had chronic fatigue and pain all over in her 90s. Pt thinks she had some fibromyalgia with her symptoms. Pt thinks she needs to get a specialty care. While she was in [facility] they told her they just dx with the geneticist. Pt has done some research on who to see. Pt thought she should come up with her own plan because evreyone is so different. Pt sates she was right on the border of EDS but since it has changed she's no longer dx as that.  Pt asked for a blood test to rule out other dx, but she never put down what  her opinion on the chart. Pt would like Dr. Brigitte Pulse to call her by the end of June since that's when she leaves. Pt is frustrated from the road blocks she's encountered this 1.5 years. Pt mentioned she can't walk with he SI joint. There is some places in [place], [place], and [place] that treat EDS. Pt states the name has changed and it gets reclassified. Pt states there isn't a treatment for it since it's a genetic abnormality. Pt is trying to see another specialist. Pt is off of gaba penton because it makes her feel like she's in a fog.    Pt is concerned about everyone is not talking to each other. Pt feels like she's going in circles. Pt can't find anyone to help her. There are people supposed to be at her job to help her when she needs it  and when she calls they don't come. Pt states there are shifts she could take that people who are able, are taking the shifts she should be doing for her accomodations. She states her mangaer hasn't been respecting her restrictions. Pt states her manager wrote on her performance evaluation that if pt wasn't better in a certain amount of time she would be fired. Pt states she's in bed recovering for the whole weekend and she can't get a 5 day a week job. Pt has tried to get a new job.  Pt is supposed to have a gum recession surgery today. Pt has had 3 mouth surgeries already.  [1:50] Pt states the zofran didn't work. She thinks part of her nausea is stress.   Therapy:  Pt is stressed over finding another place to live now that her boyfriend is moving to [place]. Pt hasn't been to therapy because talking hasn't helped for her int he past. Pt states she would rather exercise to clear her mind. Pt states having things in plan has helped her get over hard times in the past. Pt states that she has always been self sufficient and going with the flow. Pt doesn;t want to talk about it; she wants to fix it.  Breathing: Pt went to [facility] for her asthma. Pt had trouble breathing bc of her reactive air way- was referred to pulomologist Dr. Donneta Romberg.  Old Notes: In Feb dr. Eden Lathe complete disability stating she needed it once weekly home exercise 3 ties a week and if symptoms persisted after spt then to be referred to spine surgeon for her sacral illiac surgeon. FMLA forms are scanned in Feb are blank. Pt was written by Dr. Micheline Chapman for Dec 13th 2016 to March 2017 saying she can work 2 days on and 1 day on-walking no more than 4 hours on a 8 hour day and no more than 1 hour continuously. Maanya seen Oct 2016 by a fit for duty [facility] evaluation for return for work- at that point it was noted pt can't sit for more than 2 hours at the time, needed to change position for 10 mins at a time, coulndn't walk for 30 min  continuesly, limit walking as pain tolerates. This was conducted by Dr. Mal Amabile. It appears Sept 2016 pt started seeking EDA accomodations throught [employer] the management company that they had not been approved. HR had been contacted without success. At that time pt was recommended to continue to speak with her HR representative to help get EDA accomodations approved and moving forward. Also [employer] life insurance completed in Oct 2016. A note in Set 2016 states that she  had been rejected for .8 FTE as there are no .8 FTE available. That evaluation noted that pt had difference in opinion about her limitations between her and HR with Dr. Oneida Alar unable to certify and she was a new pt at that time. She was sent PT and occupational health for consult to find her better necessary limitations. Her last FLMA was last filled out under Dr. Pauletta Browns name, clearly not completed by him.  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 She was recommended to start a home toning program 30 min of exercise 3 times a week. Rechecking every 1-2 years. Her last visit with Dr. Oneida Alar was 4 months ago. She was complaining she was too tired to go to PT if she was working 8 hrs that day. She seen Dr. Derry Skill who recommended SI joint fusion but Dr. Eden Lathe was concerned about her ability to heal since her hyper mobility. He notes that she did meet criteria for ehler-danlos type 3. Gorden Harms PT evaluated her floor work accomodations and said she met criteria to do lift work. She was referred to [facility] to confirm the ED dx. She was recommeded to stay on tramadol, trazodone, and mobic. Dr. Oneida Alar advised her  That no additional work accomodation would change her pain issue, that she could continue on her current condition for the net 6 months but wont add new restrictions or reauthorize. PT felt that her current job  was already low intensity. Her pain and limitations are worse than can be objectively demonstrated at job-if she can't work a her current low movement job she should seek disability.   Treatment of JAS hypermobility syndrome should be individualized managed by PCP referred by PT and OT subspeciality manifestations. Regular physical and occupational therapy should improve symptoms. No trials of therapy in these pts and main stable multidiscipline therapy including psychology and CPT. Joint hypermobility dec with age and results in less injuries  Patient Active Problem List   Diagnosis Date Noted  . Genetic testing 09/21/2015  . Family history of breast cancer in female 08/26/2015  . Fibrocystic breast changes of both breasts 07/19/2015  . Carpal tunnel syndrome 07/07/2015  . Ehlers-Danlos syndrome 05/11/2015  . Sacroiliac joint dysfunction of right side 11/26/2014  . Hypermobility syndrome 11/26/2014   Past Medical History  Diagnosis Date  . Asthma   . Carpal tunnel syndrome 07/07/2015    right   No past surgical history on file. Allergies  Allergen Reactions  . Poison Oak Extract [Extract Of Poison Oak] Anaphylaxis  . Penicillins   . Sulfa Antibiotics    Prior to Admission medications   Medication Sig Start Date End Date Taking? Authorizing Provider  Albuterol Sulfate 108 (90 BASE) MCG/ACT AEPB Inhale into the lungs.   Yes Historical Provider, MD  hyoscyamine (LEVBID) 0.375 MG 12 hr tablet Take 0.375 mg by mouth 2 (two) times daily.   Yes Historical Provider, MD  levocetirizine (XYZAL) 5 MG tablet Take 1 tablet (5 mg total) by mouth every evening. 08/14/15  Yes Shawnee Knapp, MD  meloxicam (MOBIC) 15 MG tablet Take 1 tablet (15 mg total) by mouth daily. 05/28/15  Yes Stefanie Libel, MD  montelukast (SINGULAIR) 10 MG tablet Take 10 mg by mouth at bedtime.   Yes Historical Provider, MD  mupirocin ointment (BACTROBAN) 2 % Apply 1 application topically 3 (three) times daily. 07/02/15  Yes Ezekiel Slocumb,  PA-C  traMADol (ULTRAM) 50 MG tablet Take 2 tabs three times a day 07/02/15  Yes Stefanie Libel, MD  traZODone (DESYREL) 50 MG tablet Take 2-3 tabs po QHS for sleep. 05/28/15  Yes Stefanie Libel, MD   Social History   Social History  . Marital Status: Single    Spouse Name: N/A  . Number of Children: N/A  . Years of Education: N/A   Occupational History  . Not on file.   Social History Main Topics  . Smoking status: Never Smoker   . Smokeless tobacco: Never Used  . Alcohol Use: No  . Drug Use: No  . Sexual Activity: Not on file   Other Topics Concern  . Not on file   Social History Narrative   Depression screen [facility] 2/9 09/23/2015 07/19/2015 06/24/2015 05/04/2015 04/17/2015  Decreased Interest 0 0 0 0 0  Down, Depressed, Hopeless 0 0 0 0 0  PHQ - 2 Score 0 0 0 0 0   Review of Systems  Constitutional: Positive for appetite change (loss) and unexpected weight change (lost 10 lbs).  Gastrointestinal: Positive for nausea. Negative for vomiting, abdominal pain, diarrhea, constipation and abdominal distention.  Genitourinary: Negative for vaginal discharge.    Objective:  BP 118/76 mmHg  Pulse 77  Temp(Src) 97.4 F (36.3 C) (Oral)  Resp 15  Ht 5' 6"  (1.676 m)  Wt 140 lb (63.504 kg)  BMI 22.61 kg/m2  SpO2 99%  LMP 04/08/2014  Physical Exam  Constitutional: She appears well-developed and well-nourished. No distress.  HENT:  Head: Normocephalic and atraumatic.  Eyes: Conjunctivae are normal.  Neck: Neck supple.  Cardiovascular: Normal rate, regular rhythm, S1 normal, S2 normal and normal heart sounds.  Exam reveals no gallop and no friction rub.   No murmur heard. Pulmonary/Chest: Effort normal and breath sounds normal. No respiratory distress. She has no wheezes. She has no rales.  Abdominal: Soft. Bowel sounds are normal. There is tenderness. There is no rebound, no guarding and negative Murphy's sign.  Neurological: She is alert.  Skin: Skin is warm and dry.  Psychiatric:  She has a normal mood and affect. Her behavior is normal.  Nursing note and vitals reviewed.  Results for orders placed or performed in visit on 09/23/15  POCT urinalysis dipstick  Result Value Ref Range   Color, UA yellow yellow   Clarity, UA clear clear   Glucose, UA negative negative   Bilirubin, UA negative negative   Ketones, POC UA negative negative   Spec Grav, UA 1.010    Blood, UA negative negative   pH, UA 7.0    Protein Ur, POC negative negative   Urobilinogen, UA 0.2    Nitrite, UA Negative Negative   Leukocytes, UA Negative Negative  POCT Microscopic Urinalysis ([facility])  Result Value Ref Range   WBC,UR,HPF,POC None None WBC/hpf   RBC,UR,HPF,POC None None RBC/hpf   Bacteria None None, Too numerous to count   Mucus Absent Absent   Epithelial Cells, UR Per Microscopy Few (A) None, Too numerous to count cells/hpf  POCT CBC  Result Value Ref Range   WBC 4.6 4.6 - 10.2 K/uL   Lymph, poc 1.4 0.6 - 3.4   POC LYMPH PERCENT 30.4 10 - 50 %L   MID (cbc) 0.4 0 - 0.9   POC MID % 9.6 0 - 12 %M   POC Granulocyte 2.8 2 - 6.9   Granulocyte percent 60.0 37 - 80 %G   RBC 4.68 4.04 - 5.48 M/uL  Hemoglobin 14.6 12.2 - 16.2 g/dL   HCT, POC 40.4 37.7 - 47.9 %   MCV 86.2 80 - 97 fL   MCH, POC 31.2 27 - 31.2 pg   MCHC 36.2 (A) 31.8 - 35.4 g/dL   RDW, POC 12.7 %   Platelet Count, POC 237 142 - 424 K/uL   MPV 7.4 0 - 99.8 fL   Assessment & Plan:   1. Nausea without vomiting - no sig improvement with zofran so will try reglan  2. Generalized abdominal pain - did improve with GI cocktail in the office so try ppi. If sxs continue, will need further eval - poss imaging or h. Pylori test, consider GI c/s.  3. Ehlers-Danlos syndrome   4. Hypermobility syndrome   5. Sacroiliac joint dysfunction of right side   Note given to encourage pt to pursue the 0.8 FTE job instead of the 1.0 which would allow her sufficient time to schedule in the necessary appointment and PT during the work week,  she needs a regular schedule so that she can get this variety of appts sched. She also should not be lifting or moving to much and needs to have adequate breaks whenever she tires. Strongly encouraged pt to start seeing a psychologist - she has had a very difficult 18 mos with the new EDS which means she will likely be living with chronic pain and disability and there is not a lot of medical knowledge or understanding about this disease process.  She has multiple other life stressors as well which I think she needs some guidance navigating.  Gave pt info on Dr. Burman Nieves outside of [facility] who specializes in [place] - he does not file insurance so would initially be oop for her but it sounds like he is happy to c/s with her PCP if we need additional medical guidance. I will also try to contact [facility] pediatric geneticist whom pt saw to see if they have recommendations about local providers who have expertise in EDS. I do think pt would benefit from a more comprehensive team of PT/OT who all understands her disease process  Orders Placed This Encounter  Procedures  . Comprehensive metabolic panel  . Lipase  . POCT urinalysis dipstick  . POCT Microscopic Urinalysis ([facility])  . POCT CBC    Meds ordered this encounter  Medications  . ondansetron (ZOFRAN-ODT) disintegrating tablet 8 mg    Sig:   . gi cocktail (Maalox,Lidocaine,Donnatal)    Sig:   . omeprazole (PRILOSEC) 40 MG capsule    Sig: Take 1 capsule (40 mg total) by mouth daily. 30 minutes before dinner    Dispense:  30 capsule    Refill:  1  . metoCLOPramide (REGLAN) 5 MG tablet    Sig: Take 1 tablet (5 mg total) by mouth every 6 (six) hours as needed for nausea.    Dispense:  40 tablet    Refill:  1   Over 40 min spent in face-to-face evaluation of and consultation with patient and coordination of care.  Over 50% of this time was spent counseling this patient.  I personally performed the services described in this documentation, which  was scribed in my presence. The recorded information has been reviewed and considered, and addended by me as needed.   [name], M.D.  [facility] [address] [place], [place] [address] [phone] phone [phone] fax  09/25/2015 6:13 PM

## 2015-09-23 NOTE — Patient Instructions (Addendum)
Below 1st and 3rd offices are the ones I would MOST recommend for you - most of the providers at these two are psychologists - drs who do not prescribe medication.  Some excellent private psychiatrists for individual counseling are:  [facility] [place]  [place], [place] [address] Phone: [phone]  [facility]  [address], [place], [place] [address]  Phone:([phone]) [place], [place], [place] [address]  Phone: [phone]  [facility]  [address] [place], [place] [address]  Phone:([phone]) [place], [place], [place], [place], [place] [address] Phone: [place]      IF you received an x-ray today, you will receive an invoice from [facility]. Please contact [facility] at [phone] with questions or concerns regarding your invoice.   IF you received labwork today, you will receive an invoice from [employer]. Please contact [facility] at [phone] with questions or concerns regarding your invoice.   Our billing staff will not be able to assist you with questions regarding bills from these companies.  You will be contacted with the lab results as soon as they are available. The fastest way to get your results is to activate your My Chart account. Instructions are located on the last page of this paperwork. If you have not heard from [place] regarding the results in 2 weeks, please contact this office.    Chronic Pain Chronic pain can be defined as pain that is off and on and lasts for 3-6 months or longer. Many things cause chronic pain, which can make it difficult to make a diagnosis. There are many treatment options available for chronic pain. However, finding a treatment that works well for you may require trying various  approaches until the right one is found. Many people benefit from a combination of two or more types of treatment to control their pain. SYMPTOMS  Chronic pain can occur anywhere in the body and can range from mild to very severe. Some types of chronic pain include:  Headache.  Low back pain.  Cancer pain.  Arthritis pain.  Neurogenic pain. This is pain resulting from damage to nerves. People with chronic pain may also have other symptoms such as:  Depression.  Anger.  Insomnia.  Anxiety. DIAGNOSIS  Your health care provider will help diagnose your condition over time. In many cases, the initial focus will be on excluding possible conditions that could be causing the pain. Depending on your symptoms, your health care provider may order tests to diagnose your condition. Some of these tests may include:   Blood tests.   CT scan.   MRI.   X-rays.   Ultrasounds.   Nerve conduction studies.  You may need to see a specialist.  TREATMENT  Finding treatment that works well may take time. You may be referred to a pain specialist. He or she may prescribe medicine or therapies, such as:   Mindful meditation or yoga.  Shots (injections) of numbing or pain-relieving medicines into the spine or area of pain.  Local electrical stimulation.  Acupuncture.   Massage therapy.   Aroma, color, light, or sound therapy.   Biofeedback.   Working with a physical therapist to keep from getting stiff.   Regular, gentle exercise.   Cognitive or behavioral therapy.   Group support.  Sometimes, surgery may be recommended.  HOME CARE INSTRUCTIONS   Take all medicines as directed by your health care provider.   Lessen stress in your life by relaxing and doing things such as listening to calming music.   Exercise or be active as directed by your health care provider.   Eat a healthy diet and include things such as vegetables, fruits, fish, and lean meats in your  diet.   Keep all follow-up appointments with your health care provider.   Attend a support group with others suffering from chronic pain. SEEK MEDICAL CARE IF:   Your pain gets worse.   You develop a new pain that was not there before.   You cannot tolerate medicines given to you by your health care provider.   You have new symptoms since your last visit with your health care provider.  SEEK IMMEDIATE MEDICAL CARE IF:   You feel weak.   You have decreased sensation or numbness.   You lose control of bowel or bladder function.   Your pain suddenly gets much worse.   You develop shaking.  You develop chills.  You develop confusion.  You develop chest pain.  You develop shortness of breath.  MAKE SURE YOU:  Understand these instructions.  Will watch your condition.  Will get help right away if you are not doing well or get worse.   This information is not intended to replace advice given to you by your health care provider. Make sure you discuss any questions you have with your health care provider.   Document Released: 12/18/2001 Document Revised: 11/28/2012 Document Reviewed: 09/21/2012 Elsevier Interactive Patient Education [employer].

## 2015-09-24 LAB — COMPREHENSIVE METABOLIC PANEL
ALT: 17 U/L (ref 6–29)
AST: 21 U/L (ref 10–35)
Albumin: 3.7 g/dL (ref 3.6–5.1)
Alkaline Phosphatase: 74 U/L (ref 33–115)
BUN: 16 mg/dL (ref 7–25)
CALCIUM: 8.5 mg/dL — AB (ref 8.6–10.2)
CO2: 15 mmol/L — AB (ref 20–31)
Chloride: 111 mmol/L — ABNORMAL HIGH (ref 98–110)
Creat: 1 mg/dL (ref 0.50–1.10)
GLUCOSE: 78 mg/dL (ref 65–99)
POTASSIUM: 3.8 mmol/L (ref 3.5–5.3)
Sodium: 141 mmol/L (ref 135–146)
Total Bilirubin: 0.4 mg/dL (ref 0.2–1.2)
Total Protein: 6.1 g/dL (ref 6.1–8.1)

## 2015-09-24 LAB — LIPASE: Lipase: 53 U/L (ref 7–60)

## 2015-09-25 ENCOUNTER — Telehealth: Payer: Self-pay

## 2015-09-25 MED ORDER — METOCLOPRAMIDE HCL 5 MG PO TABS: 5.0000 mg | ORAL_TABLET | Freq: Four times a day (QID) | ORAL | Status: DC | PRN

## 2015-09-25 MED ORDER — OMEPRAZOLE 40 MG PO CPDR
40.0000 mg | DELAYED_RELEASE_CAPSULE | Freq: Every day | ORAL | Status: DC
Start: 2015-09-25 — End: 2015-10-26

## 2015-09-25 MED FILL — METOCLOPRAMIDE 5 MG TABLET: 5 | 10 days supply | Qty: 40 | Fill #0

## 2015-09-25 MED FILL — OMEPRAZOLE DR 40 MG CAPSULE: 40 | 30 days supply | Qty: 30 | Fill #0

## 2015-09-25 NOTE — Telephone Encounter (Signed)
Pt. Has been notified.

## 2015-09-25 NOTE — Telephone Encounter (Signed)
Oops!  Totally forgot. I apologize. Has now been sent.

## 2015-09-25 NOTE — Telephone Encounter (Signed)
Patient stated that she was seen on 09/23/2015 by you and she was told that her RX for Reglan and Prilosec would be called into the [facility]. Can you send these Rxs in for her? I'm asking because I do not see them listed in her medication list nor in her office note from that day.   Call back number is [phone]

## 2015-10-02 ENCOUNTER — Telehealth: Payer: Self-pay

## 2015-10-02 ENCOUNTER — Ambulatory Visit (INDEPENDENT_AMBULATORY_CARE_PROVIDER_SITE_OTHER): Payer: 59 | Admitting: Family Medicine

## 2015-10-02 VITALS — BP 122/72 | HR 70 | Temp 98.2°F | Resp 17 | Ht 66.5 in | Wt 138.0 lb

## 2015-10-02 DIAGNOSIS — R05 Cough: Secondary | ICD-10-CM | POA: Diagnosis not present

## 2015-10-02 DIAGNOSIS — J3489 Other specified disorders of nose and nasal sinuses: Secondary | ICD-10-CM | POA: Diagnosis not present

## 2015-10-02 DIAGNOSIS — Z23 Encounter for immunization: Secondary | ICD-10-CM | POA: Diagnosis not present

## 2015-10-02 DIAGNOSIS — J329 Chronic sinusitis, unspecified: Secondary | ICD-10-CM

## 2015-10-02 DIAGNOSIS — J309 Allergic rhinitis, unspecified: Secondary | ICD-10-CM

## 2015-10-02 DIAGNOSIS — J45909 Unspecified asthma, uncomplicated: Secondary | ICD-10-CM | POA: Diagnosis not present

## 2015-10-02 DIAGNOSIS — Z0184 Encounter for antibody response examination: Secondary | ICD-10-CM

## 2015-10-02 DIAGNOSIS — R059 Cough, unspecified: Secondary | ICD-10-CM

## 2015-10-02 LAB — POCT CBC
GRANULOCYTE PERCENT: 69.8 % (ref 37–80)
HEMATOCRIT: 41 % (ref 37.7–47.9)
Hemoglobin: 14.4 g/dL (ref 12.2–16.2)
Lymph, poc: 1.8 (ref 0.6–3.4)
MCH, POC: 30.6 pg (ref 27–31.2)
MCHC: 35.2 g/dL (ref 31.8–35.4)
MCV: 87 fL (ref 80–97)
MID (CBC): 0.6 (ref 0–0.9)
MPV: 7.4 fL (ref 0–99.8)
POC GRANULOCYTE: 5.4 (ref 2–6.9)
POC LYMPH %: 23.1 % (ref 10–50)
POC MID %: 7.1 % (ref 0–12)
Platelet Count, POC: 285 10*3/uL (ref 142–424)
RBC: 4.72 M/uL (ref 4.04–5.48)
RDW, POC: 13 %
WBC: 7.8 10*3/uL (ref 4.6–10.2)

## 2015-10-02 MED ORDER — METHYLPREDNISOLONE ACETATE 80 MG/ML IJ SUSP
80.0000 mg | Freq: Once | INTRAMUSCULAR | Status: AC
Start: 2015-10-02 — End: 2015-10-02
  Administered 2015-10-02: 80 mg via INTRAMUSCULAR

## 2015-10-02 NOTE — Progress Notes (Addendum)
By signing my name below, I, Lindsey [name], attest that this documentation has been prepared under the direction and in the presence of [name], MD.  Electronically Signed: [name], Medical Scribe. 10/02/2015. 5:24 PM.  Subjective:    Patient ID: Lindsey [name], female    DOB: [birth year], 50 y.o.   MRN: [id number]  HPI Chief Complaint  Patient presents with  . Asthma  . Immunizations    Titer, hep C    HPI Comments: Lindsey [name] is a 50 y.o. female who presents to the [facility] complaining of asthma onset 2 weeks. Pt thought she got rid of her asthma last November.  Pt has tried Dymista, and Dulera for relief. Pt has been having nasal congestion, rhinorrhea, fever, and persistent cough since Novemeber. Pt states she always has swollen lymph nodes. Pt reports pain and burning in her chest. Pt's fevers have reached 101 when she doesn't take Tylenol. Pt uses her Albuterol 4 times a day- last used it around noon today. Pt saw Dr. Donneta Romberg a month ago and he recommended Dulera QD, as well as the Dymista QD for her symptoms. Pt states she has been taking Zyrtec, and Allegra D.  Pt states Tessalon no longer works for her. Pt would like to dose down on her Gabapintin. Pt does not want to do a dose taper pack.  Pt would like to get her MMR, chicken pox, t-dap, Hep B, and Hep C immunizations. Pt had a series of Hep B in 2001.  Patient Active Problem List   Diagnosis Date Noted  . Genetic testing 09/21/2015  . Family history of breast cancer in female 08/26/2015  . Fibrocystic breast changes of both breasts 07/19/2015  . Carpal tunnel syndrome 07/07/2015  . Ehlers-Danlos syndrome 05/11/2015  . Sacroiliac joint dysfunction of right side 11/26/2014  . Hypermobility syndrome 11/26/2014   Past Medical History  Diagnosis Date  . Asthma   . Carpal tunnel syndrome 07/07/2015    right   No past surgical history on file. Allergies  Allergen Reactions  . Poison Oak  Extract [Extract Of Poison Oak] Anaphylaxis  . Penicillins   . Sulfa Antibiotics    Prior to Admission medications   Medication Sig Start Date End Date Taking? Authorizing Provider  Albuterol Sulfate 108 (90 BASE) MCG/ACT AEPB Inhale into the lungs.   Yes Historical Provider, MD  Azelastine-Fluticasone (DYMISTA) 137-50 MCG/ACT SUSP Place into the nose.   Yes Historical Provider, MD  fluticasone furoate-vilanterol (BREO ELLIPTA) 200-25 MCG/INH AEPB Inhale 1 puff into the lungs daily.   Yes Historical Provider, MD  hyoscyamine (LEVBID) 0.375 MG 12 hr tablet Take 0.375 mg by mouth 2 (two) times daily.   Yes Historical Provider, MD  levocetirizine (XYZAL) 5 MG tablet Take 1 tablet (5 mg total) by mouth every evening. 08/14/15  Yes Shawnee Knapp, MD  meloxicam (MOBIC) 15 MG tablet Take 1 tablet (15 mg total) by mouth daily. 05/28/15  Yes Stefanie Libel, MD  metoCLOPramide (REGLAN) 5 MG tablet Take 1 tablet (5 mg total) by mouth every 6 (six) hours as needed for nausea. 09/25/15  Yes Shawnee Knapp, MD  montelukast (SINGULAIR) 10 MG tablet Take 10 mg by mouth at bedtime.   Yes Historical Provider, MD  mupirocin ointment (BACTROBAN) 2 % Apply 1 application topically 3 (three) times daily. 07/02/15  Yes Ezekiel Slocumb, PA-C  omeprazole (PRILOSEC) 40 MG capsule Take 1 capsule (40 mg total) by mouth daily. [place]  minutes before dinner 09/25/15  Yes Shawnee Knapp, MD  traMADol Veatrice Bourbon) 50 MG tablet Take 2 tabs three times a day 07/02/15  Yes Stefanie Libel, MD  traZODone (DESYREL) 50 MG tablet Take 2-3 tabs po QHS for sleep. 05/28/15  Yes Stefanie Libel, MD   Social History   Social History  . Marital Status: Single    Spouse Name: N/A  . Number of Children: N/A  . Years of Education: N/A   Occupational History  . Not on file.   Social History Main Topics  . Smoking status: Never Smoker   . Smokeless tobacco: Never Used  . Alcohol Use: No  . Drug Use: No  . Sexual Activity: Not on file   Other Topics Concern  . Not on  file   Social History Narrative   Review of Systems  Constitutional: Positive for fever.  HENT: Positive for congestion, rhinorrhea and sinus pressure.   Eyes: Positive for redness.  Respiratory: Positive for cough.   Cardiovascular: Positive for chest pain.  Psychiatric/Behavioral: Positive for sleep disturbance.   Objective:  BP 122/72 mmHg  Pulse 70  Temp(Src) 98.2 F (36.8 C) (Oral)  Resp 17  Ht 5' 6.5" (1.689 m)  Wt 138 lb (62.596 kg)  BMI 21.94 kg/m2  SpO2 99%  LMP 04/08/2014  Physical Exam  Constitutional: She is oriented to person, place, and time. She appears well-developed and well-nourished. No distress.  HENT:  Head: Normocephalic and atraumatic.  Right Ear: Hearing, tympanic membrane, external ear and ear canal normal.  Left Ear: Hearing, tympanic membrane, external ear and ear canal normal.  Nose: Nose normal.  Mouth/Throat: Oropharynx is clear and moist. No oropharyngeal exudate.  Minimal sinus tenderness  Eyes: Conjunctivae and EOM are normal. Pupils are equal, round, and reactive to light.  Cardiovascular: Normal rate, regular rhythm, normal heart sounds and intact distal pulses.  Exam reveals no gallop and no friction rub.   No murmur heard. Pulmonary/Chest: Effort normal and breath sounds normal. No respiratory distress. She has no wheezes. She has no rhonchi. She has no rales.  Lymphadenopathy:  No lymph adenopathy  Neurological: She is alert and oriented to person, place, and time.  Skin: Skin is warm and dry. No rash noted.  Psychiatric: She has a normal mood and affect. Her behavior is normal.  Vitals reviewed.  Results for orders placed or performed in visit on 10/02/15  POCT CBC  Result Value Ref Range   WBC 7.8 4.6 - 10.2 K/uL   Lymph, poc 1.8 0.6 - 3.4   POC LYMPH PERCENT 23.1 10 - 50 %L   MID (cbc) 0.6 0 - 0.9   POC MID % 7.1 0 - 12 %M   POC Granulocyte 5.4 2 - 6.9   Granulocyte percent 69.8 37 - 80 %G   RBC 4.72 4.04 - 5.48 M/uL    Hemoglobin 14.4 12.2 - 16.2 g/dL   HCT, POC 41.0 37.7 - 47.9 %   MCV 87.0 80 - 97 fL   MCH, POC 30.6 27 - 31.2 pg   MCHC 35.2 31.8 - 35.4 g/dL   RDW, POC 13.0 %   Platelet Count, POC 285 142 - 424 K/uL   MPV 7.4 0 - 99.8 fL    Assessment & Plan:   GENE COLEE is a 50 y.o. female Cough - Plan: POCT CBC, methylPREDNISolone acetate (DEPO-MEDROL) injection 80 mg Asthma, unspecified asthma severity, uncomplicated - Plan: POCT CBC Sinus pressure - Plan: methylPREDNISolone acetate (DEPO-MEDROL)  injection 80 mg Allergic rhinitis, unspecified allergic rhinitis type - Plan: methylPREDNISolone acetate (DEPO-MEDROL) injection 80 mg Chronic sinusitis, unspecified location - Plan: methylPREDNISolone acetate (DEPO-MEDROL) injection 80 mg  -Suspect a component of chronic sinusitis, allergic rhinitis, and cough variant asthma. Recent persistent use of albuterol, but reassuring exam in office, and reassuring vitals as well as CBC.  -Agreed to try Depo-Medrol 80 mg injection, but continue her Breo inhaler, as well as Dymista nasal spray.  Has albuterol if needed for breakthrough symptoms.  -Follow-up with allergist and pulmonary as planned. Would consider ENT eval for persistent, recurrent sinus symptoms. Can discuss with pulmonary/allergist. ER/RTC precautions given.  Need for Tdap vaccination - Plan: Tdap vaccine greater than or equal to 7yo given.  Immunity status testing - Plan: Hepatitis B surface antibody, Hepatitis C antibody, Measles/Mumps/Rubella Immunity, Varicella zoster antibody, IgG  -Check hep B surface antibody, hep C antibody, MMR and varicella titers for work.   Meds ordered this encounter  Medications  . Azelastine-Fluticasone (DYMISTA) 137-50 MCG/ACT SUSP    Sig: Place into the nose.  . fluticasone furoate-vilanterol (BREO ELLIPTA) 200-25 MCG/INH AEPB    Sig: Inhale 1 puff into the lungs daily.  . methylPREDNISolone acetate (DEPO-MEDROL) injection 80 mg    Sig:    Patient  Instructions       IF you received an x-ray today, you will receive an invoice from [facility]. Please contact [facility] at [phone] with questions or concerns regarding your invoice.   IF you received labwork today, you will receive an invoice from [employer]. Please contact [facility] at [phone] with questions or concerns regarding your invoice.   Our billing staff will not be able to assist you with questions regarding bills from these companies.  You will be contacted with the lab results as soon as they are available. The fastest way to get your results is to activate your My Chart account. Instructions are located on the last page of this paperwork. If you have not heard from [place] regarding the results in 2 weeks, please contact this office.     Your blood count is reassuring and your exam is reassuring today. I do not hear any wheezing. If you are wheezing, you can use albuterol as instructed by other providers. I would keep follow-up with pulmonary specialist and discuss your allergy medicine with your allergist. Depo-Medrol injection was given today, but if you have persistent symptoms, recommend follow-up with an ear nose and throat doctor, pulmonology, or allergist to discuss other options. If you do have a measured fever over 100, or any worsening of your symptoms, recommend recheck here or emergency room.    Your titer results should be back within the next week to 10 days. You were given TDap today. We will send the message regarding gabapentin to Dr. Brigitte Pulse.      I personally performed the services described in this documentation, which was scribed in my presence. The recorded information has been reviewed and considered, and addended by me as needed.   Signed,   [name], MD [facility].  10/02/2015 6:12 PM

## 2015-10-02 NOTE — Telephone Encounter (Signed)
Dr. Brigitte Pulse,  Patient came in to see Dr. Carlota Raspberry today and had a question for you in regards to her Gabapentin. Per pt she knows that you guys had discuss reducing her dose and tapering her off the medication, however patient patient would like to know if she can stay on Gabapentin 100mg -300mg  qhs prn. Please advise if patient needs to see you to further discuss or if you are ok with her staying on the medication PRN without being seen by you. Please advise   Thanks   Harve Spradley

## 2015-10-02 NOTE — Patient Instructions (Addendum)
     IF you received an x-ray today, you will receive an invoice from [facility]. Please contact [facility] at [phone] with questions or concerns regarding your invoice.   IF you received labwork today, you will receive an invoice from [employer]. Please contact [facility] at [phone] with questions or concerns regarding your invoice.   Our billing staff will not be able to assist you with questions regarding bills from these companies.  You will be contacted with the lab results as soon as they are available. The fastest way to get your results is to activate your My Chart account. Instructions are located on the last page of this paperwork. If you have not heard from [place] regarding the results in 2 weeks, please contact this office.     Your blood count is reassuring and your exam is reassuring today. I do not hear any wheezing. If you are wheezing, you can use albuterol as instructed by other providers. I would keep follow-up with pulmonary specialist and discuss your allergy medicine with your allergist. Depo-Medrol injection was given today, but if you have persistent symptoms, recommend follow-up with an ear nose and throat doctor, pulmonology, or allergist to discuss other options. If you do have a measured fever over 100, or any worsening of your symptoms, recommend recheck here or emergency room.    Your titer results should be back within the next week to 10 days. You were given TDap today. We will send the message regarding gabapentin to Dr. Brigitte Pulse.

## 2015-10-03 ENCOUNTER — Telehealth: Payer: Self-pay

## 2015-10-03 LAB — HEPATITIS B SURFACE ANTIBODY, QUANTITATIVE: Hepatitis B-Post: 2.4 m[IU]/mL

## 2015-10-03 LAB — HEPATITIS C ANTIBODY: HCV Ab: NEGATIVE

## 2015-10-03 MED ORDER — GABAPENTIN 100 MG PO CAPS: 100.0000 mg | ORAL_CAPSULE | Freq: Four times a day (QID) | ORAL | Status: DC | PRN

## 2015-10-03 NOTE — Telephone Encounter (Signed)
Left a vm for patient to callback and get above message.

## 2015-10-03 NOTE — Telephone Encounter (Signed)
Hewitt Shorts at 10/03/2015 2:03 PM     Status: Signed       Expand All Collapse All   Pt called back to let us know that yes she needs a rx sent in to [place] out patient pharmacy and would like it to be 100mg  to get be better control of dosage 300 makes her more groggy this is for gabapentin  Best number [phone]

## 2015-10-03 NOTE — Telephone Encounter (Signed)
done

## 2015-10-03 NOTE — Telephone Encounter (Signed)
That's fine with me for her to stay on the gabapentin prn. Does she need a rx sent in?

## 2015-10-03 NOTE — Telephone Encounter (Signed)
Pt called back to let us know that yes she needs a rx sent in to [facility] out patient pharmacy and would like it to be 100mg  to get be better control of dosage 300 makes her more groggy this is for gabapentin  Best number [phone]

## 2015-10-03 NOTE — Telephone Encounter (Signed)
Routed with previous phone message to Dr. Brigitte Pulse.

## 2015-10-05 LAB — MEASLES/MUMPS/RUBELLA IMMUNITY
RUBEOLA IGG: 49.1 [AU]/ml — AB (ref <25.00)
Rubella: 17.3 Index — ABNORMAL HIGH (ref <0.90)

## 2015-10-05 LAB — VARICELLA ZOSTER ANTIBODY, IGG

## 2015-10-05 MED FILL — GABAPENTIN 100 MG CAPSULE: 100 | 30 days supply | Qty: 120 | Fill #0

## 2015-10-08 ENCOUNTER — Ambulatory Visit (INDEPENDENT_AMBULATORY_CARE_PROVIDER_SITE_OTHER): Payer: 59 | Admitting: Physician Assistant

## 2015-10-08 VITALS — BP 120/60 | HR 98 | Temp 98.2°F | Resp 16 | Ht 66.5 in | Wt 137.0 lb

## 2015-10-08 DIAGNOSIS — Z23 Encounter for immunization: Secondary | ICD-10-CM | POA: Diagnosis not present

## 2015-10-08 DIAGNOSIS — J329 Chronic sinusitis, unspecified: Secondary | ICD-10-CM | POA: Diagnosis not present

## 2015-10-08 DIAGNOSIS — Z111 Encounter for screening for respiratory tuberculosis: Secondary | ICD-10-CM

## 2015-10-08 NOTE — Progress Notes (Signed)
Patient ID: Lindsey [name], female    DOB: [birth year], 50 y.o.   MRN: [id number]  PCP: [name], MD  Subjective:   Chief Complaint  Patient presents with  . Immunizations    mmr, tb gold, and hep b/ pt would also lika a referal to EMT    HPI Presents for immunizations and requesting ENT referral.   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 Needs vaccines (MMR and Hep B) and Quantiferon Gold test.  Quantiferon Gold drawn 12/26/2014 was negative, but will expire before she needs the documentation for school. Had Hep B vaccine series >10 years ago. Titer was negative.  Chronic nasal and sinus congestion x 6 months. Dr. Donneta Romberg recommended she see Dr. Benjamine Mola but advised her that her PCP should refer her.   Review of Systems Feels well. No problems other than chronic sinus congestion.    Patient Active Problem List   Diagnosis Date Noted  . Genetic testing 09/21/2015  . Family history of breast cancer in female 08/26/2015  . Fibrocystic breast changes of both breasts 07/19/2015  . Carpal tunnel syndrome 07/07/2015  . Primary arthrosis of first carpometacarpal joints, bilateral 06/01/2015  . Ehlers-Danlos syndrome 05/11/2015  . Sacroiliac joint dysfunction of right side 11/26/2014  . Hypermobility syndrome 11/26/2014  . Essential alopecia of women 04/14/2011  . Inflammatory dermatosis 04/14/2011     Prior to Admission medications   Medication Sig Start Date End Date Taking? Authorizing Provider  Albuterol Sulfate 108 (90 BASE) MCG/ACT AEPB Inhale into the lungs.   Yes Historical Provider, MD  Azelastine-Fluticasone (DYMISTA) 137-50 MCG/ACT SUSP Place into the nose.   Yes Historical Provider, MD  cetirizine (ZYRTEC) 5 MG tablet Take 10 mg by mouth every morning.   Yes Historical Provider, MD  CycloSPORINE (RESTASIS OP) Apply to eye.   Yes Historical Provider, MD  estradiol (VIVELLE-DOT) 0.05 MG/24HR patch  09/10/15  Yes Historical Provider,  MD  fluticasone furoate-vilanterol (BREO ELLIPTA) 200-25 MCG/INH AEPB Inhale 1 puff into the lungs daily.   Yes Historical Provider, MD  gabapentin (NEURONTIN) 100 MG capsule Take 1 capsule (100 mg total) by mouth 4 (four) times daily as needed. 10/03/15  Yes Shawnee Knapp, MD  hyoscyamine (LEVBID) 0.375 MG 12 hr tablet Take 0.375 mg by mouth 2 (two) times daily.   Yes Historical Provider, MD  levocetirizine (XYZAL) 5 MG tablet Take 1 tablet (5 mg total) by mouth every evening. 08/14/15  Yes Shawnee Knapp, MD  meloxicam (MOBIC) 15 MG tablet Take 1 tablet (15 mg total) by mouth daily. 05/28/15  Yes Stefanie Libel, MD  metoCLOPramide (REGLAN) 5 MG tablet Take 1 tablet (5 mg total) by mouth every 6 (six) hours as needed for nausea. 09/25/15  Yes Shawnee Knapp, MD  montelukast (SINGULAIR) 10 MG tablet Take 10 mg by mouth at bedtime.   Yes Historical Provider, MD  mupirocin ointment (BACTROBAN) 2 % Apply 1 application topically 3 (three) times daily. 07/02/15  Yes Ezekiel Slocumb, PA-C  omeprazole (PRILOSEC) 40 MG capsule Take 1 capsule (40 mg total) by mouth daily. 30 minutes before dinner 09/25/15  Yes Shawnee Knapp, MD  progesterone (PROMETRIUM) 100 MG capsule  09/10/15  Yes Historical Provider, MD  traMADol (ULTRAM) 50 MG tablet Take 2 tabs three times a day 07/02/15  Yes Stefanie Libel, MD  traZODone (DESYREL) 50 MG tablet Take 2-3 tabs po QHS for sleep. 05/28/15  Yes Stefanie Libel, MD  Allergies  Allergen Reactions  . Poison Oak Extract [Extract Of Poison Oak] Anaphylaxis  . Penicillins   . Sulfa Antibiotics        Objective:  Physical Exam  Constitutional: She is oriented to person, place, and time. She appears well-developed and well-nourished. She is active and cooperative. No distress.  BP 120/60 mmHg  Pulse 98  Temp(Src) 98.2 F (36.8 C) (Oral)  Resp 16  Ht 5' 6.5" (1.689 m)  Wt 137 lb (62.143 kg)  BMI 21.78 kg/m2  SpO2 97%  LMP 04/08/2014   Eyes: Conjunctivae are normal.  Pulmonary/Chest: Effort  normal.  Neurological: She is alert and oriented to person, place, and time.  Psychiatric: She has a normal mood and affect. Her speech is normal and behavior is normal.           Assessment & Plan:   1. Need for hepatitis B vaccination Booster today. Recommend repeat titer in 4 weeks. If negative, proceed with remaining 2 doses in series, followed by repeat titer, 4 weeks after 3rd dose. If still negative, she will be a non-responder and additional series not indicated. - Hepatitis B vaccine adult IM - Care order/instruction  2. Need for MMR vaccine - MMR vaccine subcutaneous  3. Screening-pulmonary TB - Quantiferon tb gold assay (blood)  4. Chronic sinusitis, unspecified location - Ambulatory referral to ENT   Fara Chute, PA-C Physician Assistant-Certified [facility]

## 2015-10-08 NOTE — Patient Instructions (Addendum)
I recommend that you return in 4 weeks for a hep B titer. If it is positive, you will not need additional doses. If it's negative, then we'll proceed with the other 2 doses, and repeat the titer 4 weeks after the 3rd dose.  If it's still negative, you will be considered a "non-responder" and additional series will not benefit you.     IF you received an x-ray today, you will receive an invoice from [facility]. Please contact [facility] at [phone] with questions or concerns regarding your invoice.   IF you received labwork today, you will receive an invoice from [employer]. Please contact [facility] at [phone] with questions or concerns regarding your invoice.   Our billing staff will not be able to assist you with questions regarding bills from these companies.  You will be contacted with the lab results as soon as they are available. The fastest way to get your results is to activate your My Chart account. Instructions are located on the last page of this paperwork. If you have not heard from [place] regarding the results in 2 weeks, please contact this office.

## 2015-10-09 MED FILL — OSCIMIN SR 0.375 MG TABLET: 0.375 | 90 days supply | Qty: 180 | Fill #0

## 2015-10-09 MED FILL — PROGESTERONE 100 MG CAPSULE: 100 | 30 days supply | Qty: 30 | Fill #1

## 2015-10-09 MED FILL — ESTRADIOL 0.05 MG PATCH: 0.05 | 28 days supply | Qty: 8 | Fill #1

## 2015-10-10 LAB — QUANTIFERON TB GOLD ASSAY (BLOOD)
INTERFERON GAMMA RELEASE ASSAY: NEGATIVE
Mitogen-Nil: 4.62 IU/mL
Quantiferon Nil Value: 0.05 IU/mL

## 2015-10-14 ENCOUNTER — Ambulatory Visit: Payer: 59 | Admitting: *Deleted

## 2015-10-16 ENCOUNTER — Encounter: Payer: Self-pay | Admitting: *Deleted

## 2015-10-16 ENCOUNTER — Other Ambulatory Visit: Payer: Self-pay | Admitting: *Deleted

## 2015-10-16 VITALS — BP 92/60 | Ht 66.0 in | Wt 135.6 lb

## 2015-10-16 DIAGNOSIS — E785 Hyperlipidemia, unspecified: Secondary | ICD-10-CM

## 2015-10-16 NOTE — Patient Outreach (Signed)
[facility]) Care Management   10/16/2015  Lindsey [name] 28-Feb-1966 [id number]  Lindsey [name] is an 50 y.o. female who presents to the [facility] office to enroll in the Link To Wellness program for self management assistance with hyperlipidemia.  Subjective: Griffyn says she was diagnosed with mildly elevated total cholesterol in February of 2016 and despite changes in diet and exercise and weight loss of 10 lbs, it increased to 225 in February of this year. She says she was able to bring her triglycerides down from 165 to 71, and HDL from 48 to 51. She wants to avoid cholesterol medicine if at all possible. 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 She says she also struggles with asthma and was hoping she could get some financial assistance with her asthma medications.   Objective:   Review of Systems  Constitutional: Negative.     Physical Exam  Constitutional: She is oriented to person, place, and time. She appears well-developed and well-nourished.  Respiratory: Effort normal.  Neurological: She is alert and oriented to person, place, and time.  Skin: Skin is warm and dry.  Psychiatric: She has a normal mood and affect. Her behavior is normal. Judgment and thought content normal.   Filed Weights   10/16/15 1023  Weight: 135 lb 9.6 oz (61.508 kg)   Filed Vitals:   10/16/15 1023 10/16/15 1120  BP: 92/60 92/60    Encounter Medications:   Outpatient Encounter Prescriptions as of 10/16/2015  Medication Sig Note  . Albuterol Sulfate 108 (90 BASE) MCG/ACT AEPB Inhale into the lungs.   . Calcium Carbonate-Vitamin D (CALCIUM-VITAMIN D) 500-200 MG-UNIT tablet Take 1 tablet by mouth daily.   . cetirizine (ZYRTEC) 5 MG tablet Take 10 mg by mouth every morning.   . CycloSPORINE (RESTASIS OP) Apply to eye.   .  estradiol (VIVELLE-DOT) 0.05 MG/24HR patch  10/08/2015: Received from: External Pharmacy  . fluticasone furoate-vilanterol (BREO ELLIPTA) 200-25 MCG/INH AEPB Inhale 1 puff into the lungs daily.   Marland Kitchen gabapentin (NEURONTIN) 100 MG capsule Take 1 capsule (100 mg total) by mouth 4 (four) times daily as needed.   . hyoscyamine (LEVBID) 0.375 MG 12 hr tablet Take 0.375 mg by mouth 2 (two) times daily.   Marland Kitchen levocetirizine (XYZAL) 5 MG tablet Take 1 tablet (5 mg total) by mouth every evening.   . Melatonin 10 MG TABS Take by mouth.   . meloxicam (MOBIC) 15 MG tablet Take 1 tablet (15 mg total) by mouth daily.   . metoCLOPramide (REGLAN) 5 MG tablet Take 1 tablet (5 mg total) by mouth every 6 (six) hours as needed for nausea.   . montelukast (SINGULAIR) 10 MG tablet Take 10 mg by mouth at bedtime.   Marland Kitchen omeprazole (PRILOSEC) 40 MG capsule Take 1 capsule (40 mg total) by mouth daily. 30 minutes before dinner   . progesterone (PROMETRIUM) 100 MG capsule  10/08/2015: Received from: External Pharmacy  . traMADol (ULTRAM) 50 MG tablet Take 2 tabs three times a day   . traZODone (DESYREL) 50 MG tablet Take 2-3 tabs po QHS for sleep.   . Azelastine-Fluticasone (DYMISTA) 137-50 MCG/ACT SUSP Place into the nose. Reported on 10/16/2015   . mupirocin ointment (BACTROBAN) 2 % Apply 1 application topically 3 (three) times daily. (Patient not taking: Reported on 10/16/2015)    No facility-administered encounter  medications on file as of 10/16/2015.    Functional Status:   In your present state of health, do you have any difficulty performing the following activities: 10/16/2015  Hearing? N  Vision? N  Difficulty concentrating or making decisions? N  Walking or climbing stairs? N  Dressing or bathing? N  Doing errands, shopping? N  Preparing Food and eating ? N  Using the Toilet? N  In the past six months, have you accidently leaked urine? N  Do you have problems with loss of bowel control? N  Managing your Medications? N   Managing your Finances? N  Housekeeping or managing your Housekeeping? 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  Plan:  [facility] CM Care Plan Problem One        Most Recent Value   Care Plan Problem One  [employer] employee with Hyperlipidemia with total cholesterol= 225 and LDL= 160 , enrolling in Link To Wellness program for self management assistance   Role Documenting the Problem One  Care Management Coordinator   Care Plan for Problem One  Active   [facility]N Long Term Goal (31-90 days)  Improved knowledge of cholesterol management via lifestyle behavior as evidenced by improved lipid profile at next assessment   [facility]N Long Term Goal Start Date  10/16/15   Interventions for Problem One Long Term Goal  Discussed Link to Wellness program goals, requirements and benefits, reviewed member's rights and responsibilities ,provided hyperlipidemia information packet with explanation of contents, ensured member agreed and signed consent to participate and authorization to release and receive health information, consent, participation agreement and consent to enroll in program, assessed member's current knowledge of hyperlipidemia, discussed nutritional counseling benefit provided by [place] and encouraged patient to see dietician to assist with dietary management of hyperlipidemia, with patient in agreement, referral for nutritional counseling with a registered dietician faxed to the [facility], provided handout on how to lower total cholesterol and LDL, will arrange for Link To Wellness follow up as needed     RNCM to fax today's office visit note to Dr. Marijean Heath. RNCM will meet  as needed with patient  to assist with hyperlipidemia self-management and assess patient's progress toward mutually set goals.

## 2015-10-19 MED FILL — traMADol HCL 50 MG TABS: 50 | 30 days supply | Qty: 180 | Fill #2

## 2015-10-26 ENCOUNTER — Ambulatory Visit (INDEPENDENT_AMBULATORY_CARE_PROVIDER_SITE_OTHER): Payer: 59 | Admitting: Family Medicine

## 2015-10-26 VITALS — BP 118/76 | HR 74 | Temp 97.9°F | Resp 18 | Ht 66.0 in | Wt 136.0 lb

## 2015-10-26 DIAGNOSIS — I959 Hypotension, unspecified: Secondary | ICD-10-CM

## 2015-10-26 DIAGNOSIS — R55 Syncope and collapse: Secondary | ICD-10-CM | POA: Diagnosis not present

## 2015-10-26 DIAGNOSIS — Z1159 Encounter for screening for other viral diseases: Secondary | ICD-10-CM | POA: Diagnosis not present

## 2015-10-26 DIAGNOSIS — J45909 Unspecified asthma, uncomplicated: Secondary | ICD-10-CM

## 2015-10-26 DIAGNOSIS — R072 Precordial pain: Secondary | ICD-10-CM

## 2015-10-26 LAB — POCT URINALYSIS DIP (MANUAL ENTRY)
BILIRUBIN UA: NEGATIVE
BILIRUBIN UA: NEGATIVE
Blood, UA: NEGATIVE
Glucose, UA: NEGATIVE
LEUKOCYTES UA: NEGATIVE
Nitrite, UA: NEGATIVE
PH UA: 7.5
PROTEIN UA: NEGATIVE
SPEC GRAV UA: 1.015
Urobilinogen, UA: 0.2

## 2015-10-26 LAB — POCT CBC
GRANULOCYTE PERCENT: 71.6 % (ref 37–80)
HEMATOCRIT: 43 % (ref 37.7–47.9)
HEMOGLOBIN: 15.2 g/dL (ref 12.2–16.2)
Lymph, poc: 1.5 (ref 0.6–3.4)
MCH: 30.8 pg (ref 27–31.2)
MCHC: 35.4 g/dL (ref 31.8–35.4)
MCV: 86.9 fL (ref 80–97)
MID (cbc): 0.2 (ref 0–0.9)
MPV: 8.4 fL (ref 0–99.8)
POC GRANULOCYTE: 4.2 (ref 2–6.9)
POC LYMPH PERCENT: 25.3 %L (ref 10–50)
POC MID %: 3.1 % (ref 0–12)
Platelet Count, POC: 248 10*3/uL (ref 142–424)
RBC: 4.94 M/uL (ref 4.04–5.48)
RDW, POC: 12 %
WBC: 5.8 10*3/uL (ref 4.6–10.2)

## 2015-10-26 LAB — POC MICROSCOPIC URINALYSIS (UMFC): MUCUS RE: ABSENT

## 2015-10-26 MED FILL — traZODone HCL 50 MG TABS: 50 | 90 days supply | Qty: 270 | Fill #1

## 2015-10-26 NOTE — Progress Notes (Addendum)
Subjective:  By signing my name below, I, Lindsey [name], attest that this documentation has been prepared under the direction and in the presence of Lindsey Cheadle, MD. Electronically Signed: Moises [name], [place]. 10/26/2015 , 4:11 PM .  Patient was seen in Room 1 .   Patient ID: Lindsey [name], female    DOB: [birth year], 50 y.o.   MRN: [id number] Chief Complaint  Patient presents with  . Follow-up    Orthostatic BP, note for work    HPI Lindsey [name] is a 50 y.o. female who presents to [facility] for follow up.  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 She received Hepatitis B booster at that time, and needs repeat Hepatitis B titer in 4 weeks. If titer is negative, she'll need remaining 2 Hep B doses. Repeat titer after 4 weeks after 3rd dose. She also saw [facility] nurse for changes in diet and exercise.   Patient complains having low BP (running about 90/60) in the morning feeling dizziness, and seeing blots in front of her. She feels fatigue in the mornings but symptoms would resolve with improved BP into the afternoon. She denies any known palpitations. She denies checking her BP at home because she doesn't have a BP cuff. She states her [facility] nurse will be getting her a free BP cuff.   She notes eating less in the summer, mainly salads, but still regular 3 meals a day. She's also been taking apple vinegar tablets to help reduce her cholesterol. She drinks plenty of water in a day. She's also been keeping a good amount of salt and protein in her diet, including lean chicken, and protein shake.   She also complains of intermittent left sided chest tightness, believes more asthma related. When this occurs, she feels a sharp pain for a few seconds and the chest discomfort continues for about 3~4 hours. She denies chest pain. She was originally seen by her allergist, Dr. Donneta Romberg, and then was referred to Dr. Benjamine Mola, ENT.   She wasn't able to transfer to [employer]  because her manager blocked her transfer. She's under restrictions at work, but her manager has continued to place her in walking positions.   Past Medical History  Diagnosis Date  . Asthma   . Carpal tunnel syndrome 07/07/2015    right   Prior to Admission medications   Medication Sig Start Date End Date Taking? Authorizing Provider  Albuterol Sulfate 108 (90 BASE) MCG/ACT AEPB Inhale into the lungs.   Yes Historical Provider, MD  Calcium Carbonate-Vitamin D (CALCIUM-VITAMIN D) 500-200 MG-UNIT tablet Take 1 tablet by mouth daily.   Yes Historical Provider, MD  cetirizine (ZYRTEC) 5 MG tablet Take 10 mg by mouth every morning.   Yes Historical Provider, MD  CycloSPORINE (RESTASIS OP) Apply to eye.   Yes Historical Provider, MD  estradiol (VIVELLE-DOT) 0.05 MG/24HR patch  09/10/15  Yes Historical Provider, MD  gabapentin (NEURONTIN) 100 MG capsule Take 1 capsule (100 mg total) by mouth 4 (four) times daily as needed. 10/03/15  Yes Shawnee Knapp, MD  hyoscyamine (LEVBID) 0.375 MG 12 hr tablet Take 0.375 mg by mouth 2 (two) times daily.   Yes Historical Provider, MD  levocetirizine (XYZAL) 5 MG tablet Take 1 tablet (5 mg total) by mouth every evening. 08/14/15  Yes Shawnee Knapp, MD  Melatonin 10 MG TABS Take by mouth.   Yes Historical Provider, MD  meloxicam (MOBIC) 15 MG tablet  Take 1 tablet (15 mg total) by mouth daily. 05/28/15  Yes Stefanie Libel, MD  montelukast (SINGULAIR) 10 MG tablet Take 10 mg by mouth at bedtime.   Yes Historical Provider, MD  mupirocin ointment (BACTROBAN) 2 % Apply 1 application topically 3 (three) times daily. 07/02/15  Yes Ezekiel Slocumb, PA-C  progesterone (PROMETRIUM) 100 MG capsule  09/10/15  Yes Historical Provider, MD  traMADol (ULTRAM) 50 MG tablet Take 2 tabs three times a day 07/02/15  Yes Stefanie Libel, MD  traZODone (DESYREL) 50 MG tablet Take 2-3 tabs po QHS for sleep. 05/28/15  Yes Stefanie Libel, MD   Allergies  Allergen Reactions  . Poison Oak Extract [Extract Of Poison  Oak] Anaphylaxis  . Penicillins   . Sulfa Antibiotics     Review of Systems  Constitutional: Positive for fatigue. Negative for fever, chills and appetite change.  Eyes: Positive for visual disturbance.  Respiratory: Positive for chest tightness.   Cardiovascular: Negative for chest pain and palpitations.  Gastrointestinal: Negative for nausea, vomiting and diarrhea.  Neurological: Positive for dizziness. Negative for weakness and numbness.       Objective:   Physical Exam  Constitutional: She is oriented to person, place, and time. She appears well-developed and well-nourished. No distress.  HENT:  Head: Normocephalic and atraumatic.  Eyes: EOM are normal. Pupils are equal, round, and reactive to light.  Neck: Neck supple.  Cardiovascular: Normal rate.   Pulmonary/Chest: Effort normal. No respiratory distress.  Musculoskeletal: Normal range of motion.  Neurological: She is alert and oriented to person, place, and time.  Skin: Skin is warm and dry.  Psychiatric: She has a normal mood and affect. Her behavior is normal.  Nursing note and vitals reviewed.   BP 118/76 mmHg  Pulse 74  Temp(Src) 97.9 F (36.6 C) (Oral)  Resp 18  Ht 5\' 6"  (1.676 m)  Wt 136 lb (61.689 kg)  BMI 21.96 kg/m2  SpO2 98%  LMP 04/08/2014   EKG: sinus bradycardia, no ischemic changes  Results for orders placed or performed in visit on 10/26/15  POCT CBC  Result Value Ref Range   WBC 5.8 4.6 - 10.2 K/uL   Lymph, poc 1.5 0.6 - 3.4   POC LYMPH PERCENT 25.3 10 - 50 %L   MID (cbc) 0.2 0 - 0.9   POC MID % 3.1 0 - 12 %M   POC Granulocyte 4.2 2 - 6.9   Granulocyte percent 71.6 37 - 80 %G   RBC 4.94 4.04 - 5.48 M/uL   Hemoglobin 15.2 12.2 - 16.2 g/dL   HCT, POC 43.0 37.7 - 47.9 %   MCV 86.9 80 - 97 fL   MCH, POC 30.8 27 - 31.2 pg   MCHC 35.4 31.8 - 35.4 g/dL   RDW, POC 12.0 %   Platelet Count, POC 248 142 - 424 K/uL   MPV 8.4 0 - 99.8 fL   Orthostatic VS for the past 24 hrs:  BP- Lying Pulse-  Lying BP- Sitting Pulse- Sitting BP- Standing at 0 minutes Pulse- Standing at 0 minutes  10/26/15 1622 107/69 mmHg 58 107/74 mmHg 65 102/72 mmHg 84         Assessment & Plan:   1. Pre-syncope   2. Hypotension, unspecified hypotension type  - new BP drops in the mornings but no noted tachycardia - is symptomatic. Pt will get home bp cuff and monitor sev times a day for several wk to get more data points. Increase salt and  protein in diet.  3. Precordial pain   4. Extrinsic asthma, unspecified asthma severity, uncomplicated  - already seeing allergy dr. Donneta Romberg, using zyrtec, xyzal, and singular daily but will try stopping cetrizine as a good chance it is not helping to control sxs and just adding to side effects. Has been referred to ENT as well - appt in 5d and Dr. Donneta Romberg also referred her to pulm Dr. Chase Caller - her appt is in ~6 wks. Would like to try pt on an long-acting ICS due to complaints of constant chest tightness as does get some improvement with prn albuterol but will hold off since c/s are soon. Cant tol any powered inh.  5. Hypocalcemia   6. Need for hepatitis B screening test - future orders entered so pt can have confirmatory value to ensure hep B booster was effective on or after 11/07/15. If neg, will need to finish the 2 remaining doses in the full course with confirmatory after the 3rd   7.      Joint hypermobility syndrome/EDS - needs new note for work accommodations which were recommended on her functional capacity eval.  Orders Placed This Encounter  Procedures  . Comprehensive metabolic panel  . Thyroid Panel With TSH  . VITAMIN D 25 Hydroxy (Vit-D Deficiency, Fractures)  . Cortisol-pm, [name]  . Hepatitis B surface antibody    Standing Status: Future     Number of Occurrences:      Standing Expiration Date: 10/25/2016  . Cortisol-am, [name]    Standing Status: Future     Number of Occurrences:      Standing Expiration Date: 10/25/2016  . Orthostatic vital signs  .  Pulse oximetry (single)    Scheduling Instructions:     Please do PEAK FLOW, thanks.  Marland Kitchen POCT CBC  . POCT urinalysis dipstick  . POCT Microscopic Urinalysis ([facility])  . EKG 12-Lead    I personally performed the services described in this documentation, which was scribed in my presence. The recorded information has been reviewed and considered, and addended by me as needed.   Lindsey [name], M.D.  [facility] [address] [place], [place] [id number] [phone] phone [phone] fax  10/27/2015 12:20 AM

## 2015-10-26 NOTE — Patient Instructions (Addendum)
IF you received an x-ray today, you will receive an invoice from [facility]. Please contact [facility] at [phone] with questions or concerns regarding your invoice.   IF you received labwork today, you will receive an invoice from [employer]. Please contact [facility] at [phone] with questions or concerns regarding your invoice.   Our billing staff will not be able to assist you with questions regarding bills from these companies.  You will be contacted with the lab results as soon as they are available. The fastest way to get your results is to activate your My Chart account. Instructions are located on the last page of this paperwork. If you have not heard from [place] regarding the results in 2 weeks, please contact this office.   Hypotension As your heart beats, it forces blood through your arteries. This force is your blood pressure. If your blood pressure is too low for you to go about your normal activities or to support the organs of your body, you have hypotension. Hypotension is also referred to as low blood pressure. When your blood pressure becomes too low, you may not get enough blood to your brain. As a result, you may feel weak, feel lightheaded, or develop a rapid heart rate. In a more severe case, you may faint. CAUSES Various conditions can cause hypotension. These include:  Blood loss.  Dehydration.  Heart or endocrine problems.  Pregnancy.  Severe infection.  Not having a well-balanced diet filled with needed nutrients.  Severe allergic reactions (anaphylaxis). Some medicines, such as blood pressure medicine or water pills (diuretics), may lower your blood pressure below normal. Sometimes taking too much medicine or taking medicine not as directed can cause hypotension. TREATMENT  Hospitalization is sometimes required for hypotension if fluid or blood replacement is needed, if time is needed for medicines to  wear off, or if further monitoring is needed. Treatment might include changing your diet, changing your medicines (including medicines aimed at raising your blood pressure), and use of support stockings. HOME CARE INSTRUCTIONS   Drink enough fluids to keep your urine clear or pale yellow.  Take your medicines as directed by your health care provider.  Get up slowly from reclining or sitting positions. This gives your blood pressure a chance to adjust.  Wear support stockings as directed by your health care provider.  Maintain a healthy diet by including nutritious food, such as fruits, vegetables, nuts, whole grains, and lean meats. SEEK MEDICAL CARE IF:  You have vomiting or diarrhea.  You have a fever for more than 2-3 days.  You feel more thirsty than usual.  You feel weak and tired. SEEK IMMEDIATE MEDICAL CARE IF:   You have chest pain or a fast or irregular heartbeat.  You have a loss of feeling in some part of your body, or you lose movement in your arms or legs.  You have trouble speaking.  You become sweaty or feel lightheaded.  You faint. MAKE SURE YOU:   Understand these instructions.  Will watch your condition.  Will get help right away if you are not doing well or get worse.   This information is not intended to replace advice given to you by your health care provider. Make sure you discuss any questions you have with your health care provider.   Document Released: 03/28/2005 Document Revised: 01/16/2013 Document Reviewed: 09/28/2012 Elsevier Interactive Patient Education 2016 Elsevier Inc.  Orthostatic Hypotension Orthostatic hypotension is a sudden drop in blood  pressure. It happens when you quickly stand up from a seated or lying position. You may feel dizzy or light-headed. This can last for just a few seconds or for up to a few minutes. It is usually not a serious problem. However, if this happens frequently or gets worse, it can be a sign of something  more serious. CAUSES  Different things can cause orthostatic hypotension, including:   Loss of body fluids (dehydration).  Medicines that lower blood pressure.  Sudden changes in posture, such as standing up quickly after you have been sitting or lying down.  Taking too much of your medicine. SIGNS AND SYMPTOMS   Light-headedness or dizziness.   Fainting or near-fainting.   A fast heart rate.   Weakness.   Feeling tired (fatigue).  DIAGNOSIS  Your health care provider may do several things to help diagnose your condition and identify the cause. These may include:   Taking a medical history and doing a physical exam.  Checking your blood pressure. Your health care provider will check your blood pressure when you are:  Lying down.  Sitting.  Standing.  Using tilt table testing. In this test, you lie down on a table that moves from a lying position to a standing position. You will be strapped onto the table. This test monitors your blood pressure and heart rate when you are in different positions. TREATMENT  Treatment will vary depending on the cause. Possible treatments include:   Changing the dosage of your medicines.  Wearing compression stockings on your lower legs.  Standing up slowly after sitting or lying down.  Eating more salt.  Eating frequent, small meals.  In some cases, getting IV fluids.  Taking medicine to enhance fluid retention. HOME CARE INSTRUCTIONS  Only take over-the-counter or prescription medicines as directed by your health care provider.  Follow your health care provider's instructions for changing the dosage of your current medicines.  Do not stop or adjust your medicine on your own.  Stand up slowly after sitting or lying down. This allows your body to adjust to the different position.  Wear compression stockings as directed.  Eat extra salt as directed.  Do not add extra salt to your diet unless directed to by your  health care provider.  Eat frequent, small meals.  Avoid standing suddenly after eating.  Avoid hot showers or excessive heat as directed by your health care provider.  Keep all follow-up appointments. SEEK MEDICAL CARE IF:  You continue to feel dizzy or light-headed after standing.  You feel groggy or confused.  You feel cold, clammy, or sick to your stomach (nauseous).  You have blurred vision.  You feel short of breath. SEEK IMMEDIATE MEDICAL CARE IF:   You faint after standing.  You have chest pain.  You have difficulty breathing.   You lose feeling or movement in your arms or legs.   You have slurred speech or difficulty talking, or you are unable to talk.  MAKE SURE YOU:   Understand these instructions.  Will watch your condition.  Will get help right away if you are not doing well or get worse.   This information is not intended to replace advice given to you by your health care provider. Make sure you discuss any questions you have with your health care provider.   Document Released: 03/18/2002 Document Revised: 04/02/2013 Document Reviewed: 01/18/2013 Elsevier Interactive Patient Education [employer].

## 2015-10-27 ENCOUNTER — Ambulatory Visit: Payer: 59 | Admitting: *Deleted

## 2015-10-27 ENCOUNTER — Other Ambulatory Visit: Payer: Self-pay | Admitting: *Deleted

## 2015-10-27 LAB — COMPREHENSIVE METABOLIC PANEL
ALBUMIN: 4.3 g/dL (ref 3.6–5.1)
ALK PHOS: 79 U/L (ref 33–115)
ALT: 15 U/L (ref 6–29)
AST: 18 U/L (ref 10–35)
BILIRUBIN TOTAL: 0.5 mg/dL (ref 0.2–1.2)
BUN: 20 mg/dL (ref 7–25)
CALCIUM: 9.1 mg/dL (ref 8.6–10.2)
CO2: 24 mmol/L (ref 20–31)
Chloride: 104 mmol/L (ref 98–110)
Creat: 1 mg/dL (ref 0.50–1.10)
Glucose, Bld: 91 mg/dL (ref 65–99)
Potassium: 4.1 mmol/L (ref 3.5–5.3)
Sodium: 139 mmol/L (ref 135–146)
Total Protein: 6.8 g/dL (ref 6.1–8.1)

## 2015-10-27 LAB — VITAMIN D 25 HYDROXY (VIT D DEFICIENCY, FRACTURES): Vit D, 25-Hydroxy: 42 ng/mL (ref 30–100)

## 2015-10-27 LAB — THYROID PANEL WITH TSH
FREE THYROXINE INDEX: 3.8 (ref 1.4–3.8)
T3 UPTAKE: 34 % (ref 22–35)
T4, Total: 11.2 ug/dL (ref 4.5–12.0)
TSH: 1.65 mIU/L

## 2015-10-27 LAB — CORTISOL-PM, BLOOD: Cortisol - PM: 5.8 ug/dL

## 2015-10-27 NOTE — Patient Outreach (Signed)
Received e-mail from [place] requesting a voucher to purchase a home blood pressure monitor as her primary care MD is asking her to monitor her blood pressure at home,e. Reply e-mail sent to Tahlia advising her the voucher will be at the front desk at the [place] office and this will enable her to purchase the monitor for $5 at the outpatient pharmacy. Also advised her that the pharmacy staff will be happy to show her how to use the monitor if she wishes. Also told her this RNCM will assign her [facility] education modules related to taking blood pressure measurements at home.  Barrington Ellison RN,CCM,CDE [place] Management Coordinator Link To Wellness Office Phone [phone] Office Fax [phone]

## 2015-10-30 DIAGNOSIS — J310 Chronic rhinitis: Secondary | ICD-10-CM | POA: Diagnosis not present

## 2015-10-30 DIAGNOSIS — J343 Hypertrophy of nasal turbinates: Secondary | ICD-10-CM | POA: Diagnosis not present

## 2015-10-30 DIAGNOSIS — J342 Deviated nasal septum: Secondary | ICD-10-CM | POA: Diagnosis not present

## 2015-10-30 MED FILL — DESLORATADINE 5 MG TABLET: 5 | 30 days supply | Qty: 30 | Fill #0

## 2015-10-30 MED FILL — IPRATROPIUM 0.06% SPRAY: 0.06 | 30 days supply | Qty: 15 | Fill #0

## 2015-10-31 ENCOUNTER — Other Ambulatory Visit (INDEPENDENT_AMBULATORY_CARE_PROVIDER_SITE_OTHER): Payer: Self-pay | Admitting: Otolaryngology

## 2015-10-31 DIAGNOSIS — J329 Chronic sinusitis, unspecified: Secondary | ICD-10-CM

## 2015-11-04 DIAGNOSIS — L905 Scar conditions and fibrosis of skin: Secondary | ICD-10-CM | POA: Diagnosis not present

## 2015-11-04 DIAGNOSIS — L819 Disorder of pigmentation, unspecified: Secondary | ICD-10-CM | POA: Diagnosis not present

## 2015-11-05 ENCOUNTER — Ambulatory Visit: Payer: 59 | Admitting: Dietician

## 2015-11-10 ENCOUNTER — Other Ambulatory Visit: Payer: Self-pay | Admitting: Family Medicine

## 2015-11-10 MED FILL — ESTRADIOL 0.05 MG PATCH: 0.05 | 84 days supply | Qty: 24 | Fill #0

## 2015-11-10 MED FILL — PROGESTERONE 100 MG CAPSULE: 100 | 90 days supply | Qty: 90 | Fill #0

## 2015-11-11 ENCOUNTER — Telehealth: Payer: Self-pay | Admitting: Family Medicine

## 2015-11-11 MED FILL — MUPIROCIN 2% OINTMENT: 2 | 10 days supply | Qty: 22 | Fill #1

## 2015-11-11 MED FILL — LEVOCETIRIZINE 5 MG TABLET: 5 | 90 days supply | Qty: 90 | Fill #0

## 2015-11-11 MED FILL — MELOXICAM 15 MG TABLET: 15 | 90 days supply | Qty: 90 | Fill #3

## 2015-11-12 ENCOUNTER — Ambulatory Visit: Payer: 59 | Admitting: Dietician

## 2015-11-17 ENCOUNTER — Ambulatory Visit: Payer: 59 | Admitting: *Deleted

## 2015-11-17 DIAGNOSIS — M2010 Hallux valgus (acquired), unspecified foot: Secondary | ICD-10-CM

## 2015-11-17 NOTE — Progress Notes (Signed)
Patient ID: Lindsey [name], female   DOB: September 24, 1965, 50 y.o.   MRN: [id number]  Patient presents stating she has been unable to adjust to wearing the orthotics.  After examining them it is noted that the arch could use to be moved forward and increased on both.  We will send them to the manufacturer for the adjustments and call when they return.

## 2015-11-18 ENCOUNTER — Encounter: Payer: Self-pay | Admitting: Family Medicine

## 2015-11-25 ENCOUNTER — Ambulatory Visit (INDEPENDENT_AMBULATORY_CARE_PROVIDER_SITE_OTHER): Payer: 59 | Admitting: Family Medicine

## 2015-11-25 DIAGNOSIS — Z1159 Encounter for screening for other viral diseases: Secondary | ICD-10-CM

## 2015-11-26 LAB — HEPATITIS B SURFACE ANTIBODY,QUALITATIVE: HEP B S AB: POSITIVE — AB

## 2015-11-27 MED FILL — DESLORATADINE 5 MG TABLET: 5 | 30 days supply | Qty: 30 | Fill #1

## 2015-11-27 MED FILL — traMADol HCL 50 MG TABS: 50 | 30 days supply | Qty: 180 | Fill #3

## 2015-11-27 MED FILL — GABAPENTIN 100 MG CAPSULE: 100 | 30 days supply | Qty: 120 | Fill #1

## 2015-11-28 ENCOUNTER — Ambulatory Visit (INDEPENDENT_AMBULATORY_CARE_PROVIDER_SITE_OTHER): Payer: 59 | Admitting: Family Medicine

## 2015-11-28 VITALS — BP 116/78 | HR 78 | Temp 98.7°F | Resp 18 | Ht 66.0 in | Wt 136.0 lb

## 2015-11-28 DIAGNOSIS — M357 Hypermobility syndrome: Secondary | ICD-10-CM | POA: Diagnosis not present

## 2015-11-28 DIAGNOSIS — Q796 Ehlers-Danlos syndrome, unspecified: Secondary | ICD-10-CM

## 2015-11-28 MED ORDER — CYPROHEPTADINE HCL 4 MG PO TABS: 4.0000 mg | ORAL_TABLET | Freq: Three times a day (TID) | ORAL | 0 refills | Status: DC | PRN

## 2015-11-28 MED ORDER — TRAMADOL HCL 50 MG PO TABS: ORAL_TABLET | ORAL | 1 refills | Status: DC

## 2015-11-28 MED ORDER — GABAPENTIN 300 MG PO CAPS: 300.0000 mg | ORAL_CAPSULE | Freq: Every day | ORAL | 3 refills | Status: DC

## 2015-11-28 NOTE — Progress Notes (Deleted)
Subjective:    Patient ID: Lindsey [name], female    DOB: 09-May-1965, 50 y.o.   MRN: [id number] By signing my name below, I, [name], attest that this documentation has been prepared under the direction and in the presence of [name], MD. Electronically Signed: [name], ER Scribe. 11/28/2015. 12:11 PM.  Chief Complaint  Patient presents with  . Follow-up    GENETIC CONDITION   HPI HPI Comments: Lindsey [name] is a 50 y.o. female who presents to [facility] reporting for a follow up due to workplace accomodation concerns due to a genetic condition. She is having difficulty with her boss who has so far been unwilling to make any accommodations for her, and repeatedly scheduled her in walking positions that exacerbates her chronic pain. There are multiple other employees who are able to work in those positions, but her manager has refused to schedule them in those positions.  She was prescribed ipratropium nasal spray by an ENT which has improved her allergy sx. She has used flonase, nasonex and fluticasone inhaler without relief. She is using restasis and sustain ultra for her eye allergy sx. She is planning of having gum surgery due to gum recession, which is a side effect of her genetic condition. She states gabapentin has been really helping with her chronic pain. She is taking red yeast rice to manage her cholesterol. Since her last appointment she realized that that her dizzyness sx are due to not eating. When she eats well she does not get dizzy in the morning.    Past Medical History:  Diagnosis Date  . Asthma   . Carpal tunnel syndrome 07/07/2015   right   Allergies  Allergen Reactions  . Poison Oak Extract [Extract Of Poison Oak] Anaphylaxis  . Penicillins   . Sulfa Antibiotics    Current Outpatient Prescriptions on File Prior to Visit  Medication Sig Dispense Refill  . Albuterol Sulfate 108 (90 BASE) MCG/ACT AEPB Inhale into the lungs.    . cetirizine (ZYRTEC) 5 MG  tablet Take 10 mg by mouth every morning.    . CycloSPORINE (RESTASIS OP) Apply to eye.    . estradiol (VIVELLE-DOT) 0.05 MG/24HR patch   11  . hyoscyamine (LEVBID) 0.375 MG 12 hr tablet Take 0.375 mg by mouth 2 (two) times daily.    Marland Kitchen levocetirizine (XYZAL) 5 MG tablet TAKE 1 TABLET BY MOUTH EVERY EVENING 90 tablet 0  . Melatonin 10 MG TABS Take by mouth.    . meloxicam (MOBIC) 15 MG tablet Take 1 tablet (15 mg total) by mouth daily. 30 tablet 3  . montelukast (SINGULAIR) 10 MG tablet Take 10 mg by mouth at bedtime.    . mupirocin ointment (BACTROBAN) 2 % Apply 1 application topically 3 (three) times daily. 22 g 1  . progesterone (PROMETRIUM) 100 MG capsule   11  . traZODone (DESYREL) 50 MG tablet Take 2-3 tabs po QHS for sleep. 270 tablet 3   No current facility-administered medications on file prior to visit.     Review of Systems  Constitutional: Negative for chills and fever.  HENT: Positive for congestion and rhinorrhea.   Eyes: Positive for redness and itching.  Musculoskeletal: Positive for arthralgias and myalgias.  Neurological: Positive for dizziness. Negative for weakness.       Objective:   Physical Exam  Constitutional: She is oriented to person, place, and time. She appears well-developed and well-nourished. No distress.  HENT:  Head: Normocephalic and atraumatic.  Eyes: Pupils are equal, round, and reactive to light.  Neck: Neck supple.  Cardiovascular: Normal rate.   Pulmonary/Chest: Effort normal. No respiratory distress.  Musculoskeletal: Normal range of motion.  Neurological: She is alert and oriented to person, place, and time. Coordination normal.  Skin: Skin is warm and dry. She is not diaphoretic.  Psychiatric: She has a normal mood and affect. Her behavior is normal.  Nursing note and vitals reviewed.  BP 116/78 (BP Location: Right Arm, Patient Position: Sitting, Cuff Size: Small)   Pulse 78   Temp 98.7 F (37.1 C) (Oral)   Resp 18   Ht 5\' 6"   (1.676 m)   Wt 136 lb (61.7 kg)   LMP 04/08/2014   SpO2 98%   BMI 21.95 kg/m      Assessment & Plan:

## 2015-11-28 NOTE — Patient Instructions (Signed)
     IF you received an x-ray today, you will receive an invoice from [facility]. Please contact [facility] at [phone] with questions or concerns regarding your invoice.   IF you received labwork today, you will receive an invoice from [facility]. Please contact [facility] at [phone] with questions or concerns regarding your invoice.   Our billing staff will not be able to assist you with questions regarding bills from these companies.  You will be contacted with the lab results as soon as they are available. The fastest way to get your results is to activate your My Chart account. Instructions are located on the last page of this paperwork. If you have not heard from us regarding the results in 2 weeks, please contact this office.      

## 2015-11-28 NOTE — Progress Notes (Signed)
Subjective:    Patient ID: Lindsey [name], female    DOB: [birth year], 50 y.o.   MRN: [id number] Chief Complaint  Patient presents with  . Follow-up    GENETIC CONDITION     HPI [employer] sent a note to her boss telling her that she needs to be in a 0.8 position but there are not any available so [employer] teld her to come back here. Her boss will retaliate. Pt is in class M, T, Thur Can work Mon, Tues, [employer] afternoon - 1 or 3 to 11 pm She has never have had the decrease time in the walking/floor position. She bought a house and in the middle of closing which is stressful preparing for the move.  New FDA approved treatment for chronic pain called revitalight - a red light that stimulates collagen - LED light pulsation treatment.  If it reduces her pain significantly then she would like to get a rx for this  ENT rx'ed ipratropium nasal spray which has helped substantially. It aborted a asthma flaire.  Fluticasone and momentasone doesn't work for her - xyzal only lasts for 12 hrs - she not sure if she should use clarinex as well  Eyes controlled with restasis and systame ultra bid.  [facility] program so started red yeast rice.  Going to so 600mg  tid and up to 1200mg  tid on that. Started home BP monitor.  She is trying to eat more so she doesn't get dizzy.  Lindsey [name] is a 50 y.o. female who presents to [facility] reporting for a follow up due to workplace accomodation concerns due to a genetic condition. She is having difficulty with her boss who has so far been unwilling to make any accommodations for her, and repeatedly scheduled her in walking positions that exacerbates her chronic pain. There are multiple other employees who are able to work in those positions, but her manager has refused to schedule them in those positions.  She was prescribed ipratropium nasal spray by an ENT which has improved her allergy sx. She has used flonase, nasonex and fluticasone inhaler without relief. She is  using restasis and sustain ultra for her eye allergy sx. She is planning of having gum surgery due to gum recession, which is a side effect of her genetic condition. She states gabapentin has been really helping with her chronic pain. She is taking red yeast rice to manage her cholesterol. Since her last appointment she realized that that her dizzyness sx are due to not eating. When she eats well she does not get dizzy in the morning.   Past Medical History:  Diagnosis Date  . Asthma   . Carpal tunnel syndrome 07/07/2015   right   History reviewed. No pertinent surgical history. Current Outpatient Prescriptions on File Prior to Visit  Medication Sig Dispense Refill  . Albuterol Sulfate 108 (90 BASE) MCG/ACT AEPB Inhale into the lungs.    . CycloSPORINE (RESTASIS OP) Apply to eye.    . estradiol (VIVELLE-DOT) 0.05 MG/24HR patch   11  . hyoscyamine (LEVBID) 0.375 MG 12 hr tablet Take 0.375 mg by mouth 2 (two) times daily.    Marland Kitchen levocetirizine (XYZAL) 5 MG tablet TAKE 1 TABLET BY MOUTH EVERY EVENING 90 tablet 0  . Melatonin 10 MG TABS Take by mouth.    . meloxicam (MOBIC) 15 MG tablet Take 1 tablet (15 mg total) by mouth daily. 30 tablet 3  . montelukast (SINGULAIR) 10 MG tablet Take 10 mg by mouth  at bedtime.    . mupirocin ointment (BACTROBAN) 2 % Apply 1 application topically 3 (three) times daily. 22 g 1  . progesterone (PROMETRIUM) 100 MG capsule   11  . traZODone (DESYREL) 50 MG tablet Take 2-3 tabs po QHS for sleep. 270 tablet 3   No current facility-administered medications on file prior to visit.    Allergies  Allergen Reactions  . Poison Oak Extract [Extract Of Poison Oak] Anaphylaxis  . Penicillins   . Sulfa Antibiotics    Family History  Problem Relation Age of Onset  . Hypertension Mother   . Other Mother     hx of hysterectomy at 51-56 for prolapsed uterus; hx of benign L arm cyst in her 63s  . Stroke Father   . Hypertension Father   . Skin cancer Father 25    NOS  .  Other Father     enlarged prostate s/p surgery  . Alzheimer's disease Maternal Aunt   . Stomach cancer Paternal Aunt     d. 40; was a Manufacturing engineer and did not go to the doctor, so not sure age of onset  . Congestive Heart Failure Maternal Grandmother   . Prostate cancer Maternal Grandfather     dx. 75-76  . Heart Problems Paternal Grandmother   . Heart Problems Paternal Grandfather   . Colon cancer Other     maternal great aunt (MGM's sister) dx in her 107s  . Heart attack Paternal Uncle 40  . Heart Problems Paternal Uncle   . Angina Paternal Uncle   . Breast cancer Cousin     paternal 1st cousin d. 82  . Breast cancer Cousin     paternal 1st cousin dx. 93s   Social History   Social History  . Marital status: Single    Spouse name: n/a  . Number of children: 0  . Years of education: N/A   Occupational History  . Pharmacy Technician     [employer]   Social History Main Topics  . Smoking status: Never Smoker  . Smokeless tobacco: Never Used  . Alcohol use No  . Drug use: No  . Sexual activity: Not Asked   Other Topics Concern  . None   Social History Narrative   Lives with a boyfriend.     Depression screen [facility] 2/9 11/28/2015 10/16/2015 10/08/2015 10/02/2015 09/23/2015  Decreased Interest 0 0 0 0 0  Down, Depressed, Hopeless 0 0 0 0 0  PHQ - 2 Score 0 0 0 0 0    Review of Systems  Constitutional: Positive for fatigue. Negative for activity change, appetite change, fever and unexpected weight change.  HENT: Positive for congestion, postnasal drip, rhinorrhea and sinus pressure.   Eyes: Negative for discharge, redness, itching and visual disturbance.  Respiratory: Negative for cough, chest tightness and shortness of breath.   Cardiovascular: Negative for leg swelling.  Gastrointestinal: Negative for abdominal pain and vomiting.  Musculoskeletal: Positive for arthralgias, back pain, gait problem, joint swelling, myalgias, neck pain and neck  stiffness.  Skin: Positive for rash.  Neurological: Positive for light-headedness. Negative for dizziness and syncope.  Psychiatric/Behavioral: Positive for sleep disturbance. Negative for agitation and dysphoric mood. The patient is not nervous/anxious.        Objective:   Physical Exam  Constitutional: She is oriented to person, place, and time. She appears well-developed and well-nourished. No distress.  HENT:  Head: Normocephalic and atraumatic.  Right Ear: External ear normal.  Eyes: Conjunctivae are  normal. No scleral icterus.  Pulmonary/Chest: Effort normal.  Neurological: She is alert and oriented to person, place, and time.  Skin: Skin is warm and dry. She is not diaphoretic. No erythema.  Psychiatric: She has a normal mood and affect. Her behavior is normal.      BP 116/78 (BP Location: Right Arm, Patient Position: Sitting, Cuff Size: Small)   Pulse 78   Temp 98.7 F (37.1 C) (Oral)   Resp 18   Ht 5\' 6"  (1.676 m)   Wt 136 lb (61.7 kg)   LMP 04/08/2014   SpO2 98%   BMI 21.95 kg/m      Assessment & Plan:  Last filled tramadol #180/mo filled yesterday 8/18 1. Hypermobility syndrome   2. Ehlers-Danlos syndrome      Meds ordered this encounter  Medications  . Calcium Carbonate-Vitamin D3 600-400 MG-UNIT TABS    Sig: Take by mouth.  . loratadine (CLARITIN) 10 MG tablet    Sig: Take 10 mg by mouth daily.  Marland Kitchen ipratropium (ATROVENT) 0.03 % nasal spray    Sig: Place 2 sprays into both nostrils every 12 (twelve) hours.  . cyproheptadine (PERIACTIN) 4 MG tablet    Sig: Take 1 tablet (4 mg total) by mouth 3 (three) times daily as needed for allergies.    Dispense:  90 tablet    Refill:  0  . traMADol (ULTRAM) 50 MG tablet    Sig: Take 2 tabs three times a day    Dispense:  540 tablet    Refill:  1  . gabapentin (NEURONTIN) 300 MG capsule    Sig: Take 1 capsule (300 mg total) by mouth at bedtime.    Dispense:  90 capsule    Refill:  3   Over 25 min spent in  face-to-face evaluation of and consultation with patient and coordination of care.  Over 50% of this time was spent counseling this patient.   [name], M.D.  [facility] [address] [place], [place] [address] [phone] phone [phone] fax  12/13/15 10:01 PM

## 2015-11-30 MED FILL — CYPROHEPTADINE 4 MG TABLET: 4 | 30 days supply | Qty: 90 | Fill #0

## 2015-11-30 MED FILL — GABAPENTIN 300 MG CAPSULE: 300 | 90 days supply | Qty: 90 | Fill #0

## 2015-12-02 ENCOUNTER — Ambulatory Visit (INDEPENDENT_AMBULATORY_CARE_PROVIDER_SITE_OTHER): Payer: 59 | Admitting: Internal Medicine

## 2015-12-02 ENCOUNTER — Encounter: Payer: Self-pay | Admitting: Internal Medicine

## 2015-12-02 VITALS — BP 102/68 | HR 91 | Ht 66.0 in | Wt 135.0 lb

## 2015-12-02 DIAGNOSIS — J387 Other diseases of larynx: Secondary | ICD-10-CM

## 2015-12-02 DIAGNOSIS — R05 Cough: Secondary | ICD-10-CM

## 2015-12-02 DIAGNOSIS — R053 Chronic cough: Secondary | ICD-10-CM

## 2015-12-02 HISTORY — DX: Other diseases of larynx: J38.7

## 2015-12-02 HISTORY — DX: Chronic cough: R05.3

## 2015-12-02 NOTE — Patient Instructions (Addendum)
ICD-9-CM ICD-10-CM   1. Chronic cough 786.2 R05   2. Irritable larynx syndrome 478.79 J38.7    Definite element of cough neuropathy or irritable larynx syndrome  Increase her gabapentin to 300 mg twice daily for 1 week and then increase to 3 mg 3 times daily and continue that dose  Refer speech therapy voice rehabilitation neuro rehabilitation by Mr Garald Balding  Do high resolution CT chest  Do CT sinus without contrast  Continue all your other medications for the moment  Follow-up 4-6 weeks with myself or nurse practitioner - RSI cough score at follow-up

## 2015-12-02 NOTE — Progress Notes (Signed)
Subjective:    Patient ID: Lindsey [name], female    DOB: Apr 25, 1965, 50 y.o.   MRN: [id number]  PCP [name], MD   HPI    IOV 12/02/2015  Chief Complaint  Patient presents with  . Pulmonary Consult    Pt referred by Dr. Donneta Romberg for asthma. Pt c/o hoarseness, PND, nonprod cough. Pt denies SOB and CP/tightness.     According to chief complaint she is referred for asthma. However according to patient history appears that the real problem is chronic cough. According to the patient in 1996 she moved from [place] to [place] area. Shortly after that she developed chronic cough and postnasal drainage. She was maintained on multiple different antihistamines. She mentions many names and it appears that all been rotated with varying effects. She's also been on Advair for a long time. Then subsequently may be 2 years ago he switched her care to Dr. Donneta Romberg in [place]. At this point in time she says she had a blood and skin allergy test which was only positive for triggers. Exhaled nitric oxide according to the outside chart is normal. She tried Brio but it did not work. She has spirometry is March 2016 and February 2016 which are all normal including may 2017 Most recent nitric oxide May 2017 was 19 ppb and normal.  She tells me for the past one year for chronic cough this is significantly higher level. It is constant in it does not wake her up. The significant postnasal drainage and a tickle in her throat. Occasionally she clears her throat. This started August 2016 and then she had influenza and a couple of episodes of bronchitis and the cough never clear. It is at least moderate in intensity. Med review shows that she is on Singulair but also on tramadol and gabapentin for cough neuropathy/irritable larynx syndrome but without any relief but she tells me that this is for chronic pain and she been on it for many years. SHe is also on significant antihistamines and nasal sprays.  RSI cough  score is 24 and consistent with irritable larynx syndrome or cough neuropathy  Blood work 10/22/2015, hemoglobin 15.2 g percent   Chest x-ray 2 view 09/02/2015: Personally visualized shows hyperinflation  Interferon gamma Gold TB testing: 10/08/2015: Negative  She has seen ENT within a month and the recommended a CT sinus that she did not do  She does not think she has acid reflux  Asthma doesn't negative so far other than hyperinflation on chest x-ray   Dr Lorenza Cambridge Reflux Symptom Index (> 13-15 suggestive of LPR cough) 0 -> 5  =  none ->severe problem  Hoarseness of problem with voice 5  Clearing  Of Throat 3  Excess throat mucus or feeling of post nasal drip 5  Difficulty swallowing food, liquid or tablets 3  Cough after eating or lying down 4  Breathing difficulties or choking episodes 0  Troublesome or annoying cough 3  Sensation of something sticking in throat or lump in throat 1  Heartburn, chest pain, indigestion, or stomach acid coming up 0  TOTAL 24      has a past medical history of Asthma and Carpal tunnel syndrome (07/07/2015).   reports that she has never smoked. She has never used smokeless tobacco.  No past surgical history on file.  Allergies  Allergen Reactions  . Poison Oak Extract [Extract Of Poison Oak] Anaphylaxis  . Penicillins   . Sulfa Antibiotics     Immunization History  Administered Date(s) Administered  . Hepatitis B, adult 10/08/2015  . Influenza-Unspecified 11/25/2015  . MMR 10/08/2015  . Pneumococcal Polysaccharide-23 11/25/2015  . Tdap 10/02/2015    Family History  Problem Relation Age of Onset  . Hypertension Mother   . Other Mother     hx of hysterectomy at 68-56 for prolapsed uterus; hx of benign L arm cyst in her 62s  . Stroke Father   . Hypertension Father   . Skin cancer Father 69    NOS  . Other Father     enlarged prostate s/p surgery  . Alzheimer's disease Maternal Aunt   . Stomach cancer Paternal Aunt     d. 7;  was a Manufacturing engineer and did not go to the doctor, so not sure age of onset  . Congestive Heart Failure Maternal Grandmother   . Prostate cancer Maternal Grandfather     dx. 75-76  . Heart Problems Paternal Grandmother   . Heart Problems Paternal Grandfather   . Colon cancer Other     maternal great aunt (MGM's sister) dx in her 60s  . Heart attack Paternal Uncle 100  . Heart Problems Paternal Uncle   . Angina Paternal Uncle   . Breast cancer Cousin     paternal 1st cousin d. 39  . Breast cancer Cousin     paternal 1st cousin dx. 50s     Current Outpatient Prescriptions:  .  Albuterol Sulfate 108 (90 BASE) MCG/ACT AEPB, Inhale into the lungs., Disp: , Rfl:  .  Calcium Carbonate-Vitamin D3 600-400 MG-UNIT TABS, Take by mouth., Disp: , Rfl:  .  CycloSPORINE (RESTASIS OP), Apply to eye., Disp: , Rfl:  .  estradiol (VIVELLE-DOT) 0.05 MG/24HR patch, , Disp: , Rfl: 11 .  gabapentin (NEURONTIN) 300 MG capsule, Take 1 capsule (300 mg total) by mouth at bedtime., Disp: 90 capsule, Rfl: 3 .  hyoscyamine (LEVBID) 0.375 MG 12 hr tablet, Take 0.375 mg by mouth 2 (two) times daily., Disp: , Rfl:  .  ipratropium (ATROVENT) 0.03 % nasal spray, Place 2 sprays into both nostrils every 12 (twelve) hours., Disp: , Rfl:  .  levocetirizine (XYZAL) 5 MG tablet, TAKE 1 TABLET BY MOUTH EVERY EVENING, Disp: 90 tablet, Rfl: 0 .  loratadine (CLARITIN) 10 MG tablet, Take 10 mg by mouth daily., Disp: , Rfl:  .  Melatonin 10 MG TABS, Take by mouth., Disp: , Rfl:  .  meloxicam (MOBIC) 15 MG tablet, Take 1 tablet (15 mg total) by mouth daily., Disp: 30 tablet, Rfl: 3 .  montelukast (SINGULAIR) 10 MG tablet, Take 10 mg by mouth at bedtime., Disp: , Rfl:  .  mupirocin ointment (BACTROBAN) 2 %, Apply 1 application topically 3 (three) times daily., Disp: 22 g, Rfl: 1 .  progesterone (PROMETRIUM) 100 MG capsule, , Disp: , Rfl: 11 .  traMADol (ULTRAM) 50 MG tablet, Take 2 tabs three times a day, Disp: 540 tablet, Rfl:  1 .  traZODone (DESYREL) 50 MG tablet, Take 2-3 tabs po QHS for sleep., Disp: 270 tablet, Rfl: 3 .  cyproheptadine (PERIACTIN) 4 MG tablet, Take 1 tablet (4 mg total) by mouth 3 (three) times daily as needed for allergies. (Patient not taking: Reported on 12/02/2015), Disp: 90 tablet, Rfl: 0     Review of Systems  Constitutional: Negative for fever and unexpected weight change.  HENT: Positive for congestion and postnasal drip. Negative for dental problem, ear pain, nosebleeds, rhinorrhea, sinus pressure, sneezing, sore throat and trouble swallowing.  Eyes: Negative for redness and itching.  Respiratory: Positive for cough. Negative for chest tightness, shortness of breath and wheezing.   Cardiovascular: Negative for palpitations and leg swelling.  Gastrointestinal: Negative for nausea and vomiting.  Genitourinary: Negative for dysuria.  Musculoskeletal: Negative for joint swelling.  Skin: Negative for rash.  Neurological: Negative for headaches.  Hematological: Does not bruise/bleed easily.  Psychiatric/Behavioral: Negative for dysphoric mood. The patient is not nervous/anxious.        Objective:   Physical Exam  Constitutional: She is oriented to person, place, and time. She appears well-developed and well-nourished. No distress.  HENT:  Head: Normocephalic and atraumatic.  Right Ear: External ear normal.  Left Ear: External ear normal.  Mouth/Throat: Oropharynx is clear and moist. No oropharyngeal exudate.  Eyes: Conjunctivae and EOM are normal. Pupils are equal, round, and reactive to light. Right eye exhibits no discharge. Left eye exhibits no discharge. No scleral icterus.  Neck: Normal range of motion. Neck supple. No JVD present. No tracheal deviation present. No thyromegaly present.  Cardiovascular: Normal rate, regular rhythm, normal heart sounds and intact distal pulses.  Exam reveals no gallop and no friction rub.   No murmur heard. Pulmonary/Chest: Effort normal and  breath sounds normal. No respiratory distress. She has no wheezes. She has no rales. She exhibits no tenderness.  Abdominal: Soft. Bowel sounds are normal. She exhibits no distension and no mass. There is no tenderness. There is no rebound and no guarding.  Musculoskeletal: Normal range of motion. She exhibits no edema or tenderness.  Lymphadenopathy:    She has no cervical adenopathy.  Neurological: She is alert and oriented to person, place, and time. She has normal reflexes. No cranial nerve deficit. She exhibits normal muscle tone. Coordination normal.  Skin: Skin is warm and dry. No rash noted. She is not diaphoretic. No erythema. No pallor.  Psychiatric: She has a normal mood and affect. Her behavior is normal. Judgment and thought content normal.  Vitals reviewed.   Vitals:   12/02/15 1039  BP: 102/68  Pulse: 91  SpO2: 98%  Weight: 135 lb (61.2 kg)  Height: 5' 6"  (1.676 m)   Estimated body mass index is 21.79 kg/m as calculated from the following:   Height as of this encounter: 5' 6"  (1.676 m).   Weight as of this encounter: 135 lb (61.2 kg).        Assessment & Plan:     ICD-9-CM ICD-10-CM   1. Chronic cough 786.2 R05   2. Irritable larynx syndrome 478.79 J38.7     Definite element of cough neuropathy or irritable larynx syndrome  Increase her gabapentin to 300 mg twice daily for 1 week and then increase to 3 mg 3 times daily and continue that dose  Refer speech therapy voice rehabilitation neuro rehabilitation by Mr Garald Balding  Do high resolution CT chest  Do CT sinus without contrastBecause of copious sinus drainage  Continue all your other medications for the moment  Follow-up 4-6 weeks with myself or nurse practitioner - RSI cough score at follow-up    We discussed cough neuropathy in the current literature understanding. Based on this we decided to the above tests and management

## 2015-12-03 NOTE — Progress Notes (Signed)
Here for lab only visit 

## 2015-12-04 ENCOUNTER — Ambulatory Visit: Payer: 59 | Admitting: *Deleted

## 2015-12-04 ENCOUNTER — Other Ambulatory Visit (INDEPENDENT_AMBULATORY_CARE_PROVIDER_SITE_OTHER): Payer: 59 | Admitting: Family Medicine

## 2015-12-04 DIAGNOSIS — H9190 Unspecified hearing loss, unspecified ear: Secondary | ICD-10-CM | POA: Insufficient documentation

## 2015-12-04 DIAGNOSIS — I959 Hypotension, unspecified: Secondary | ICD-10-CM | POA: Diagnosis not present

## 2015-12-04 DIAGNOSIS — R55 Syncope and collapse: Secondary | ICD-10-CM

## 2015-12-04 DIAGNOSIS — M2010 Hallux valgus (acquired), unspecified foot: Secondary | ICD-10-CM

## 2015-12-04 NOTE — Progress Notes (Signed)
Patient presents to pick up adjusted orthotics.  Written and verbal instructions given.  Patient will call if further adjustments are needed.

## 2015-12-04 NOTE — Patient Instructions (Signed)

## 2015-12-05 LAB — CORTISOL-AM, BLOOD: CORTISOL - AM: 17.8 ug/dL

## 2015-12-16 ENCOUNTER — Other Ambulatory Visit: Payer: 59

## 2015-12-21 ENCOUNTER — Encounter: Payer: Self-pay | Admitting: Family Medicine

## 2015-12-21 ENCOUNTER — Telehealth: Payer: Self-pay

## 2015-12-21 NOTE — Telephone Encounter (Signed)
Pt let a msg for mychart for Dr. Brigitte Pulse about her restrictions. Pt is not sure if she will need to see her for this or contact her by phone.  Please advise  [phone]

## 2015-12-21 NOTE — Telephone Encounter (Signed)
Patient needs FMLA forms completed and faxed back to [employer], I was going to complete the forms but reading over the notes and the email I wasn't sure what to put so I just highlighted the areas that need to be filled out I will place the forms in Dr Raul Del box on 12/21/15 if you could please return them to the FMLA/Disability box at the 102 checkout desk within 5-7 business days. Thank you.

## 2015-12-24 NOTE — Telephone Encounter (Signed)
Have we completed these FMLA forms? I received a print off of the patients email to Dr Brigitte Pulse in my box with a note stating to call the patient but I'm not sure why or who left it. I need the forms completed and a letter written stating what the patients exact restrictions are.

## 2015-12-28 ENCOUNTER — Encounter: Payer: Self-pay | Admitting: Family Medicine

## 2015-12-28 NOTE — Telephone Encounter (Signed)
Have we have we completed this paperwork? Today is the 5th day and HR is giving the patient a hard time. Please let me know when this is ready thanks!

## 2015-12-29 NOTE — Telephone Encounter (Signed)
Completed.

## 2015-12-29 NOTE — Telephone Encounter (Signed)
Pt reports she turned her upper body before her legs and felt like her right hip popped out/dislocated and so she is doing much worse. She thinks a scooter would not work as there is no where to put a computer. They would make her purchase it and it could be up to thousands of dollars.  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

## 2015-12-30 ENCOUNTER — Telehealth: Payer: Self-pay

## 2015-12-30 ENCOUNTER — Encounter: Payer: Self-pay | Admitting: Family Medicine

## 2015-12-30 DIAGNOSIS — Z23 Encounter for immunization: Secondary | ICD-10-CM | POA: Diagnosis not present

## 2015-12-30 NOTE — Telephone Encounter (Signed)
I am concerned that that could cause discomfort in her shoulder girdles and further problems.  Fine if she wants to try - ok to send rx to a DME company or ocme by clinic. However, I wonder if she might do better with a rolling walker or a 4 point cane than crutches.

## 2015-12-30 NOTE — Telephone Encounter (Signed)
Patient request to leave message for Dr. Brigitte Pulse. Patient request for crutches to help with walking. Patient stated Dr. Brigitte Pulse is aware of her condition. [phone]

## 2015-12-31 NOTE — Telephone Encounter (Signed)
I sent her a MyChart message - I do not think the chair is a good idea - there is no way for her to push herself along in it - she would have to have someone push her or try to kick herself along - the latter of which is to likely to result in injury.

## 2015-12-31 NOTE — Telephone Encounter (Signed)
Spoke with pt. She does not want the walker or cane. She sent a picture of a chair she would like to try. Please advise

## 2015-12-31 NOTE — Telephone Encounter (Signed)
Attempted to call pt. Message left with call back number.

## 2016-01-05 ENCOUNTER — Ambulatory Visit (INDEPENDENT_AMBULATORY_CARE_PROVIDER_SITE_OTHER): Payer: 59 | Admitting: Family Medicine

## 2016-01-05 ENCOUNTER — Ambulatory Visit (INDEPENDENT_AMBULATORY_CARE_PROVIDER_SITE_OTHER): Payer: 59

## 2016-01-05 ENCOUNTER — Encounter: Payer: Self-pay | Admitting: Family Medicine

## 2016-01-05 VITALS — BP 100/60 | HR 88 | Temp 97.4°F | Resp 16 | Ht 66.5 in | Wt 139.2 lb

## 2016-01-05 DIAGNOSIS — M533 Sacrococcygeal disorders, not elsewhere classified: Secondary | ICD-10-CM

## 2016-01-05 DIAGNOSIS — Q796 Ehlers-Danlos syndrome, unspecified: Secondary | ICD-10-CM

## 2016-01-05 DIAGNOSIS — Z111 Encounter for screening for respiratory tuberculosis: Secondary | ICD-10-CM

## 2016-01-05 DIAGNOSIS — M357 Hypermobility syndrome: Secondary | ICD-10-CM | POA: Diagnosis not present

## 2016-01-05 NOTE — Progress Notes (Deleted)
Limited walking: No more than 2 miles a day or over 6000 steps.  Pt provides tracker.  Does work with [facility] at [place] location - has to do a membership at the club to be able to get Pilates based-PT.  [name] has seen her numerous times and she set her up with a baseline.  But it has probably been 6 months Her chronic right SI joint popped out when she turned over in bed on Saturday the 16th.  Now her left SI joint is having pain as well.  She keeps on re-injuring it.    She is considering working with a Chief Executive Officer.  Now it is poppig out WITH the bracde  Laser light therapy Kinesthesiology - helped pain, told that the SI joint was swollen and knot.  She has her office in the club off of [place] - also helps with photostim and lympth drainage which should help collagen stim as well  - Lindsey [name]  acupuncture

## 2016-01-05 NOTE — Patient Instructions (Addendum)
     IF you received an x-ray today, you will receive an invoice from [facility]. Please contact [facility] at [phone] with questions or concerns regarding your invoice.   IF you received labwork today, you will receive an invoice from [employer]. Please contact [facility] at [phone] with questions or concerns regarding your invoice.   Our billing staff will not be able to assist you with questions regarding bills from these companies.  You will be contacted with the lab results as soon as they are available. The fastest way to get your results is to activate your My Chart account. Instructions are located on the last page of this paperwork. If you have not heard from [place] regarding the results in 2 weeks, please contact this office.     Go ahead and make an appointment with your physical therapist at [facility] to get your data re-updated about your SI joint movement and stability. Call and make an appointment with Dr. Neomia Dear - lets see what she has to offer.  If it doesn't work, we could always consider a consult with Dr. Greta Doom at [facility].

## 2016-01-08 ENCOUNTER — Encounter: Payer: Self-pay | Admitting: Family Medicine

## 2016-01-11 ENCOUNTER — Encounter: Payer: Self-pay | Admitting: Family Medicine

## 2016-01-11 LAB — QUANTIFERON TB GOLD ASSAY (BLOOD)
INTERFERON GAMMA RELEASE ASSAY: NEGATIVE
QUANTIFERON NIL VALUE: 0.02 [IU]/mL
Quantiferon Tb Ag Minus Nil Value: <0 IU/mL

## 2016-01-11 NOTE — Progress Notes (Signed)
Subjective:    Patient ID: Lindsey [name], female    DOB: 1965-08-01, 50 y.o.   MRN: [id number]  Chief Complaint  Patient presents with  . Hip Pain    s i joint    HPI  Limited walking: No more than 2 miles a day or over 6000 steps.  Pt provides tracker.  Does work with [facility] PT [facility] at [place] location - has to do a membership at the club to be able to get Pilates based-PT.  Lindsey [name] has seen her numerous times and she set her up with a baseline.  But it has probably been 6 months Her chronic right SI joint popped out when she turned over in bed on Saturday the 16th.  Now her left SI joint is having pain as well.  She keeps on re-injuring it.    She is considering working with a Chief Executive Officer.  Now it is poppig out WITH the bracde  Laser light therapy Kinesthesiology - helped pain, told that the SI joint was swollen and knot.  She has her office in the club off of [place] [employer] - also helps with photostim and lympth drainage which should help collagen stim as well  - Lindsey [name]  acupuncture  Past Medical History:  Diagnosis Date  . Asthma   . Carpal tunnel syndrome 07/07/2015   right   No past surgical history on file. Current Outpatient Prescriptions on File Prior to Visit  Medication Sig Dispense Refill  . Albuterol Sulfate 108 (90 BASE) MCG/ACT AEPB Inhale into the lungs.    . Calcium Carbonate-Vitamin D3 600-400 MG-UNIT TABS Take by mouth.    . CycloSPORINE (RESTASIS OP) Apply to eye.    . cyproheptadine (PERIACTIN) 4 MG tablet Take 1 tablet (4 mg total) by mouth 3 (three) times daily as needed for allergies. 90 tablet 0  . estradiol (VIVELLE-DOT) 0.05 MG/24HR patch   11  . gabapentin (NEURONTIN) 300 MG capsule Take 1 capsule (300 mg total) by mouth at bedtime. 90 capsule 3  . hyoscyamine (LEVBID) 0.375 MG 12 hr tablet Take 0.375 mg by mouth 2 (two) times daily.    Marland Kitchen ipratropium (ATROVENT) 0.03 % nasal spray Place 2 sprays into both nostrils every 12  (twelve) hours.    Marland Kitchen levocetirizine (XYZAL) 5 MG tablet TAKE 1 TABLET BY MOUTH EVERY EVENING 90 tablet 0  . Melatonin 10 MG TABS Take by mouth.    . meloxicam (MOBIC) 15 MG tablet Take 1 tablet (15 mg total) by mouth daily. 30 tablet 3  . montelukast (SINGULAIR) 10 MG tablet Take 10 mg by mouth at bedtime.    . mupirocin ointment (BACTROBAN) 2 % Apply 1 application topically 3 (three) times daily. 22 g 1  . progesterone (PROMETRIUM) 100 MG capsule   11  . traMADol (ULTRAM) 50 MG tablet Take 2 tabs three times a day 540 tablet 1  . traZODone (DESYREL) 50 MG tablet Take 2-3 tabs po QHS for sleep. 270 tablet 3  . loratadine (CLARITIN) 10 MG tablet Take 10 mg by mouth daily.     No current facility-administered medications on file prior to visit.    Allergies  Allergen Reactions  . Poison Oak Extract [Extract Of Poison Oak] Anaphylaxis  . Penicillins   . Sulfa Antibiotics    Family History  Problem Relation Age of Onset  . Hypertension Mother   . Other Mother     hx of hysterectomy at 60-56 for prolapsed uterus; hx  of benign L arm cyst in her 42s  . Stroke Father   . Hypertension Father   . Skin cancer Father 26    NOS  . Other Father     enlarged prostate s/p surgery  . Alzheimer's disease Maternal Aunt   . Stomach cancer Paternal Aunt     d. 67; was a Manufacturing engineer and did not go to the doctor, so not sure age of onset  . Congestive Heart Failure Maternal Grandmother   . Prostate cancer Maternal Grandfather     dx. 75-76  . Heart Problems Paternal Grandmother   . Heart Problems Paternal Grandfather   . Colon cancer Other     maternal great aunt (MGM's sister) dx in her 42s  . Heart attack Paternal Uncle 34  . Heart Problems Paternal Uncle   . Angina Paternal Uncle   . Breast cancer Cousin     paternal 1st cousin d. 66  . Breast cancer Cousin     paternal 1st cousin dx. 61s   Social History   Social History  . Marital status: Single    Spouse name: n/a  .  Number of children: 0  . Years of education: N/A   Occupational History  . Pharmacy Technician     [employer]   Social History Main Topics  . Smoking status: Never Smoker  . Smokeless tobacco: Never Used  . Alcohol use No  . Drug use: No  . Sexual activity: Not Asked   Other Topics Concern  . None   Social History Narrative   Lives with a boyfriend.    Review of Systems  Constitutional: Positive for activity change. Negative for appetite change, chills, fever and unexpected weight change.  Gastrointestinal: Negative for abdominal pain, constipation, diarrhea, nausea and vomiting.  Genitourinary: Negative for decreased urine volume, difficulty urinating, frequency and urgency.  Musculoskeletal: Positive for arthralgias, back pain, gait problem, joint swelling, myalgias, neck pain and neck stiffness.  Neurological: Positive for weakness. Negative for syncope.  Hematological: Negative for adenopathy. Does not bruise/bleed easily.       Objective:   Physical Exam  Constitutional: She is oriented to person, place, and time. She appears well-developed and well-nourished. No distress.  HENT:  Head: Normocephalic and atraumatic.  Right Ear: External ear normal.  Eyes: Conjunctivae are normal. No scleral icterus.  Pulmonary/Chest: Effort normal.  Musculoskeletal:  Tenderness over right SI joint > left.  Neurological: She is alert and oriented to person, place, and time. She exhibits normal muscle tone. Coordination and gait normal.  Skin: Skin is warm and dry. She is not diaphoretic. No erythema.  Psychiatric: She has a normal mood and affect. Her behavior is normal.   BP 100/60 (BP Location: Right Arm, Patient Position: Sitting, Cuff Size: Normal)   Pulse 88   Temp 97.4 F (36.3 C) (Oral)   Resp 16   Ht 5' 6.5" (1.689 m)   Wt 139 lb 3.2 oz (63.1 kg)   LMP 04/08/2014   SpO2 98%   BMI 22.13 kg/m    Dg Si Joints  Result Date: 01/05/2016 CLINICAL DATA:   50 year old female with sacral iliac joint dysfunction on the right side. Hypermobility syndrome. Ehler Danlos syndrome. EXAM: BILATERAL SACROILIAC JOINTS - 3+ VIEW COMPARISON:  None. FINDINGS: The sacroiliac joint spaces are maintained and there is no evidence of arthropathy. No other bone abnormalities are seen. IMPRESSION: Negative. Electronically Signed   By: Vinnie Langton M.D.   On: 01/05/2016 17:01  Assessment & Plan:   1. Sacroiliac joint dysfunction of right side   2. Hypermobility syndrome   3. Ehlers-Danlos syndrome   4. Tuberculosis screening    Pt is having progressively worsening sxs of the hypermobility in her SI joints Rt>Lt.  She does not want to step up medication therapy necessarily but as her sxs are requiring numerous work accommodations and poss disability I do think she needs to have a good physiatrist/ortho to see and help with determination and support the indications for these - rec Dr. Niel Hummer or Dr. Greta Doom - contact info given. Pt also needs to cont to f/u with her PT at least monthly to help [place] determine when the work accommodations need to be changed.   Orders Placed This Encounter  Procedures  . DG Si Joints    Standing Status:   Future    Number of Occurrences:   1    Standing Expiration Date:   01/04/2017    Order Specific Question:   Reason for Exam (SYMPTOM  OR DIAGNOSIS REQUIRED)    Answer:   worsening SI joint instability    Order Specific Question:   Is the patient pregnant?    Answer:   No    Order Specific Question:   Preferred imaging location?    Answer:   External  . Quantiferon tb gold assay  . Ambulatory referral to Pain Clinic    Referral Priority:   Routine    Referral Type:   Consultation    Referral Reason:   Specialty Services Required    Requested Specialty:   Pain Medicine    Number of Visits Requested:   1  . Care order/instruction:    AVS and GO    Scheduling Instructions:     AVS and GO   Over 40 min spent  in face-to-face evaluation of and consultation with patient and coordination of care.  Over 50% of this time was spent counseling this patient.  [name], M.D.  [facility] [address] [place], [place] [address] [phone] phone [phone] fax  01/11/16 12:11 AM

## 2016-01-14 ENCOUNTER — Encounter: Payer: Self-pay | Admitting: Family Medicine

## 2016-01-14 ENCOUNTER — Telehealth: Payer: Self-pay

## 2016-01-14 MED FILL — traMADol HCL 50 MG TABS: 50 | 90 days supply | Qty: 540 | Fill #0

## 2016-01-14 NOTE — Addendum Note (Signed)
Addended by: [name] on: 01/14/2016 06:34 PM   Modules accepted: Orders

## 2016-01-14 NOTE — Telephone Encounter (Signed)
Referral placed. THanks.

## 2016-01-14 NOTE — Telephone Encounter (Signed)
Pt will need a new referral to continue physical therapy. She has given me the location to send to once the referral is placed. Thank you!

## 2016-01-15 NOTE — Telephone Encounter (Signed)
I didn't see your review of results yet so will forward this to you.

## 2016-01-25 DIAGNOSIS — M79604 Pain in right leg: Secondary | ICD-10-CM | POA: Diagnosis not present

## 2016-01-25 DIAGNOSIS — M25511 Pain in right shoulder: Secondary | ICD-10-CM | POA: Diagnosis not present

## 2016-01-25 DIAGNOSIS — G894 Chronic pain syndrome: Secondary | ICD-10-CM | POA: Diagnosis not present

## 2016-01-25 DIAGNOSIS — M792 Neuralgia and neuritis, unspecified: Secondary | ICD-10-CM | POA: Diagnosis not present

## 2016-01-25 DIAGNOSIS — M47817 Spondylosis without myelopathy or radiculopathy, lumbosacral region: Secondary | ICD-10-CM | POA: Diagnosis not present

## 2016-01-25 DIAGNOSIS — M25512 Pain in left shoulder: Secondary | ICD-10-CM | POA: Diagnosis not present

## 2016-01-25 DIAGNOSIS — M25542 Pain in joints of left hand: Secondary | ICD-10-CM | POA: Diagnosis not present

## 2016-01-25 DIAGNOSIS — M461 Sacroiliitis, not elsewhere classified: Secondary | ICD-10-CM | POA: Diagnosis not present

## 2016-01-25 DIAGNOSIS — M25541 Pain in joints of right hand: Secondary | ICD-10-CM | POA: Diagnosis not present

## 2016-01-27 ENCOUNTER — Ambulatory Visit: Payer: 59

## 2016-01-27 ENCOUNTER — Ambulatory Visit (INDEPENDENT_AMBULATORY_CARE_PROVIDER_SITE_OTHER): Payer: 59 | Admitting: Family Medicine

## 2016-01-27 VITALS — BP 112/76 | HR 88 | Temp 98.3°F | Resp 17 | Ht 66.5 in | Wt 141.0 lb

## 2016-01-27 DIAGNOSIS — M533 Sacrococcygeal disorders, not elsewhere classified: Secondary | ICD-10-CM

## 2016-01-27 DIAGNOSIS — Q796 Ehlers-Danlos syndrome, unspecified: Secondary | ICD-10-CM

## 2016-01-27 DIAGNOSIS — M357 Hypermobility syndrome: Secondary | ICD-10-CM

## 2016-01-27 DIAGNOSIS — F4321 Adjustment disorder with depressed mood: Secondary | ICD-10-CM | POA: Diagnosis not present

## 2016-01-27 MED ORDER — DULOXETINE HCL 30 MG PO CPEP: 30.0000 mg | ORAL_CAPSULE | Freq: Every day | ORAL | 1 refills | Status: DC

## 2016-01-27 MED FILL — DULoxetine HCL 30 MG CPEP: 30 | 33 days supply | Qty: 60 | Fill #0 | Status: TO

## 2016-01-27 NOTE — Progress Notes (Signed)
Subjective:  By signing my name below, I, Lindsey [name], attest that this documentation has been prepared under the direction and in the presence of Lindsey Cheadle, MD Electronically Signed: Ladene Artist, ED Scribe 01/27/2016 at 8:28 AM.   Patient ID: Lindsey [name], female    DOB: 01-17-1966, 50 y.o.   MRN: [id number]  Chief Complaint  Patient presents with  . Follow-up    "update her on my condition"   HPI HPI Comments: Lindsey [name] is a 50 y.o. female who presents to the [facility] for a follow-up on sacroiliac joint dysfunction. Pt states that she was able to see Dr. Ace Gins since her last visit. Pt states that 1 treatment would cost her close to $3,500. She has an upcoming appointment with physical therapy next week. She states that her manager decided not to accommodate her at all because her restrictions ran out in August and she has been scheduled for all walking shifts as well as all weekend shifts. She states that walking exacerbates her pain and she is in so much pain by the time that she gets to her floor that she is unable to work. Pt states that HR refuses to accommodate because she wants accommodations for her school schedule. She has updated her FMLA to 10 days/month and has used approximately 3 weeks. Her FMLA renews in August. She is requesting a few weeks off of work to rest at this visit.  Pt states that she has not seen her therapist in a while. She states that "she is okay and is compartmentalizing everything". She admits to not "compartmentalizing with anger".   Past Medical History:  Diagnosis Date  . Asthma   . Carpal tunnel syndrome 07/07/2015   right   Current Outpatient Prescriptions on File Prior to Visit  Medication Sig Dispense Refill  . Albuterol Sulfate 108 (90 BASE) MCG/ACT Lindsey [name] Inhale into the lungs.    . Calcium Carbonate-Vitamin D3 600-400 MG-UNIT TABS Take by mouth.    . CycloSPORINE (RESTASIS OP) Apply to eye.    . cyproheptadine  (PERIACTIN) 4 MG tablet Take 1 tablet (4 mg total) by mouth 3 (three) times daily as needed for allergies. 90 tablet 0  . estradiol (VIVELLE-DOT) 0.05 MG/24HR patch   11  . gabapentin (NEURONTIN) 300 MG capsule Take 1 capsule (300 mg total) by mouth at bedtime. 90 capsule 3  . hyoscyamine (LEVBID) 0.375 MG 12 hr tablet Take 0.375 mg by mouth 2 (two) times daily.    Lindsey [name] ipratropium (ATROVENT) 0.03 % nasal spray Place 2 sprays into both nostrils every 12 (twelve) hours.    Lindsey [name] levocetirizine (XYZAL) 5 MG tablet TAKE 1 TABLET BY MOUTH EVERY EVENING 90 tablet 0  . Melatonin 10 MG TABS Take by mouth.    . meloxicam (MOBIC) 15 MG tablet Take 1 tablet (15 mg total) by mouth daily. 30 tablet 3  . montelukast (SINGULAIR) 10 MG tablet Take 10 mg by mouth at bedtime.    . mupirocin ointment (BACTROBAN) 2 % Apply 1 application topically 3 (three) times daily. 22 g 1  . progesterone (PROMETRIUM) 100 MG capsule   11  . traMADol (ULTRAM) 50 MG tablet Take 2 tabs three times a day 540 tablet 1  . traZODone (DESYREL) 50 MG tablet Take 2-3 tabs po QHS for sleep. 270 tablet 3   No current facility-administered medications on file prior to visit.    Allergies  Allergen Reactions  . Poison Oak Extract [  Poison Oak Extract] Anaphylaxis  . Penicillins   . Sulfa Antibiotics    Review of Systems  Constitutional: Positive for activity change and fatigue. Negative for appetite change, chills, fever and unexpected weight change.  Cardiovascular: Negative for leg swelling.  Gastrointestinal: Negative for vomiting.  Musculoskeletal: Positive for arthralgias, back pain, gait problem, joint swelling and myalgias.  Neurological: Positive for weakness, numbness and headaches. Negative for dizziness.  Hematological: Does not bruise/bleed easily.  Psychiatric/Behavioral: Positive for dysphoric mood and sleep disturbance. Negative for agitation and behavioral problems.    BP 112/76 (BP Location: Right Arm, Patient Position:  Sitting, Cuff Size: Large)   Pulse 88   Temp 98.3 F (36.8 C) (Oral)   Resp 17   Ht 5' 6.5" (1.689 m)   Wt 141 lb (64 kg)   LMP 04/08/2014   SpO2 99%   BMI 22.42 kg/m     Objective:   Physical Exam  Constitutional: She is oriented to person, place, and time. She appears well-developed and well-nourished. No distress.  HENT:  Head: Normocephalic and atraumatic.  Eyes: Conjunctivae and EOM are normal.  Neck: Neck supple. No tracheal deviation present.  Cardiovascular: Normal rate.   Pulmonary/Chest: Effort normal. No respiratory distress.  Musculoskeletal: Normal range of motion.  Neurological: She is alert and oriented to person, place, and time.  Skin: Skin is warm and dry.  Psychiatric: Her behavior is normal. Thought content normal. Her affect is labile. Her affect is not angry. Her speech is rapid and/or pressured. Cognition and memory are normal. She exhibits a depressed mood.  Nursing note and vitals reviewed.     Assessment & Plan:  Ehlers-Danlos syndrome  Hypermobility syndrome  Sacroiliac joint dysfunction of right side  Adjustment disorder with depressed mood  Pt agrees to start trial of cymbalta on my suggestions. Advised that she really needs to contact a therapist/psychologist to help her process the effects that her worsening chronic disability limitations and restrictions are having on her mentally - all this is very depressing and that would be a natural response - she needs to not ignore this. Has appt with PT next wk. Out of work x 2 wks - encouraged pt to consider 3 wks but she would like to try 2 and will re-evaluate. Reminded pt that she will not be able to return to her full-time walking position at work.  They have chosen not to honor the restrictions letter that I wrote - stating her restrictions were related to her school/classes only and expired in August.  See the numerous letters I have written on behalf of pt - this is clearly not true. Encouraged  pt to continue to meet with HR and see if she can engage [employer] again so we can see what needs to happen for her work restrictions that I have advised to be honored.    Meds ordered this encounter  Medications  . DULoxetine (CYMBALTA) 30 MG capsule    Sig: Take 1 capsule (30 mg total) by mouth daily. Increase to 2 tabs after 2 weeks    Dispense:  60 capsule    Refill:  1   Over 40 min spent in face-to-face evaluation of and consultation with patient and coordination of care.  Over 50% of this time was spent counseling this patient.  I personally performed the services described in this documentation, which was scribed in my presence. The recorded information has been reviewed and considered, and addended by me as needed.   Lindsey [name],  M.D.  [facility] [address] [place], [place] [address] [phone] phone [phone] fax  01/27/16 9:29 AM

## 2016-01-27 NOTE — Patient Instructions (Addendum)
Try the cymbalta for chronic pain.    IF you received an x-ray today, you will receive an invoice from [facility]. Please contact [facility] at [phone] with questions or concerns regarding your invoice.   IF you received labwork today, you will receive an invoice from [employer]. Please contact [facility] at [phone] with questions or concerns regarding your invoice.   Our billing staff will not be able to assist you with questions regarding bills from these companies.  You will be contacted with the lab results as soon as they are available. The fastest way to get your results is to activate your My Chart account. Instructions are located on the last page of this paperwork. If you have not heard from [place] regarding the results in 2 weeks, please contact this office.     [place], [place], [place], [place] [address] Phone: [phone]  [place]  [address], [place], [place] [address]  [phone] [place]  [address] [place], [place] [address]  [phone]  [place] at [place]  Both of these places are great - google for there numbers.

## 2016-01-31 ENCOUNTER — Encounter: Payer: Self-pay | Admitting: Family Medicine

## 2016-01-31 ENCOUNTER — Other Ambulatory Visit: Payer: Self-pay | Admitting: Family Medicine

## 2016-02-01 NOTE — Telephone Encounter (Signed)
I pended 6 mos of all req'd meds for review.

## 2016-02-02 ENCOUNTER — Telehealth: Payer: Self-pay

## 2016-02-02 MED ORDER — TRAZODONE HCL 50 MG PO TABS: ORAL_TABLET | ORAL | 1 refills | Status: DC

## 2016-02-02 MED ORDER — MELOXICAM 15 MG PO TABS: 15.0000 mg | ORAL_TABLET | Freq: Every day | ORAL | 1 refills | Status: DC

## 2016-02-02 MED ORDER — HYOSCYAMINE SULFATE ER 0.375 MG PO TB12: 0.3750 mg | ORAL_TABLET | Freq: Two times a day (BID) | ORAL | 1 refills | Status: DC

## 2016-02-02 NOTE — Telephone Encounter (Signed)
Patient needs FMLA forms updated based off her last OV. I have completed what I could from the notes and highlighted the areas that need to be completed I will place the forms in Dr Manya Silvas box on 02/02/16 if you could please return to the FMLA/Disability box at the 102 checkout desk within 5-7 business days. Thank you!

## 2016-02-03 ENCOUNTER — Encounter: Payer: Self-pay | Admitting: Physical Therapy

## 2016-02-03 ENCOUNTER — Ambulatory Visit: Payer: 59 | Attending: Family Medicine | Admitting: Physical Therapy

## 2016-02-03 DIAGNOSIS — M357 Hypermobility syndrome: Secondary | ICD-10-CM | POA: Diagnosis not present

## 2016-02-03 DIAGNOSIS — M6281 Muscle weakness (generalized): Secondary | ICD-10-CM | POA: Insufficient documentation

## 2016-02-03 DIAGNOSIS — M62838 Other muscle spasm: Secondary | ICD-10-CM | POA: Diagnosis not present

## 2016-02-03 NOTE — Therapy (Signed)
[facility] [address] [place], [place], [address] Phone: [phone]   Fax:  [phone]  Physical Therapy Evaluation  Patient Details  Name: Lindsey [name] MRN: [id number] Date of Birth: Aug 05, 1965 Referring Provider: Dr. Mammie Lorenzo  Encounter Date: 02/03/2016      PT End of Session - 02/03/16 1011    Visit Number 1   Date for PT Re-Evaluation 03/30/16   PT Start Time 0930   PT Stop Time 1012   PT Time Calculation (min) 42 min   Activity Tolerance Patient limited by pain   Behavior During Therapy Agitated      Past Medical History:  Diagnosis Date  . Asthma   . Carpal tunnel syndrome 07/07/2015   right    History reviewed. No pertinent surgical history.  There were no vitals filed for this visit.       Subjective Assessment - 02/03/16 0941    Subjective Patient reports recent flare-up of SI joint on 12/26/2015. Manager at work is not accomodating to her walking accomodations and aggravated her SI joint and it subluxated. Patient wears a SI joint belt. Patient was doing pilates with Raeford Razor and would like to work with Myrene Galas.    Pertinent History Ehler-Danlos Symdrome; Hypermobile syndrome   Limitations Sitting;Standing   How long can you sit comfortably? 10 min.    How long can you stand comfortably? less pain   How long can you walk comfortably? not able to walk more than 5 min   Patient Stated Goals stabilized of the SI joint   Currently in Pain? Yes   Pain Score 2    Pain Location Sacrum  low back   Pain Orientation Right   Pain Descriptors / Indicators Constant   Pain Type Chronic pain   Pain Onset More than a month ago   Pain Frequency Constant   Aggravating Factors  walking, standing,    Pain Relieving Factors rest   Multiple Pain Sites No            [facility] PT Assessment - 02/03/16 0001      Assessment   Medical Diagnosis M53.3 SI joint dysfunction of right side;  M35.7 Hypermobilie Syndrome; Q79.6 Ehlers-Danlos Syndrome   Referring Provider Dr. Mammie Lorenzo   Onset Date/Surgical Date 12/26/15   Prior Therapy yes with Raeford Razor     Precautions   Precautions Other (comment)   Precaution Comments Ehlos-Danlos Syndrome; no walking more than 5 min.; No walking more than 2 miles per day     Restrictions   Weight Bearing Restrictions No     Balance Screen   Has the patient fallen in the past 6 months No   Has the patient had a decrease in activity level because of a fear of falling?  No   Is the patient reluctant to leave their home because of a fear of falling?  No     Prior Function   Level of Independence Independent   Vocation Full time employment   Sports coach   Overall Cognitive Status Within Functional Limits for tasks assessed     Observation/Other Assessments   Focus on Therapeutic Outcomes (FOTO)  56% limitation  41% limitation     ROM / Strength   AROM / PROM / Strength AROM;Strength     AROM   Overall AROM Comments lumbar ROM is limited by 25%     Strength  Overall Strength Comments right hip strength is 3+/5; abdominal strength is 2/5     Palpation   SI assessment  right SI is posteriorly rotated and tender     Special Tests   Sacroiliac Tests  Gaenslen's Test     Pelvic Dictraction   Findings Positive   Side  Right     Gaenslen's test   Findings Positive   Side  Right                             PT Short Term Goals - 02/03/16 1011      PT SHORT TERM GOAL #1   Title She will be independent with inital HEP      Time 4   Period Weeks   Status New     PT SHORT TERM GOAL #2   Title She will report pain decrease 25% or more with transitional movements (esp sitting)   Time 4   Period Weeks   Status New     PT SHORT TERM GOAL #3   Title Pt will be able to report less pain end of work day (25% less)    Time 4   Period Weeks   Status New     PT  SHORT TERM GOAL #4   Title Pt will understand concepts of stability and posture as it relates to her joint condition.    Time 4   Period Weeks   Status New           PT Long Term Goals - 02/03/16 1006      PT LONG TERM GOAL #1   Title independent with intial and all HEP/ gym program    Time 8   Period Weeks   Status New     PT LONG TERM GOAL #2   Title Pt  will report pain decreased 50% or more and able to drive to work with minimal pain increase   Time 8   Period Weeks   Status New     PT LONG TERM GOAL #3   Title Pt will be able to stand for up to an 30 min with min pain increase   Time 8   Period Weeks   Status New     PT LONG TERM GOAL #4   Title Pt will be able to do stairs without pain increase for community mobility   Time 8   Period Weeks   Status New     PT LONG TERM GOAL #5   Title Hip and core strength improved to 4+/5 needed for standing and walking longer periods of time at work and in the community   Time [place] - 02/03/16 1012    Clinical Impression Statement Patient is a 50 year old female with Ehlos-Danos Syndrome and Hypermobility syndrome.  Patient has a flare-up since 12/26/2015 due to her job not able to meet the accomodations she needs due to her diagnosis.  Patient reports her pain is constant at level 2/10 in the sacrum and right SI joint.  Patient has increased pain with sitting and walking.  Patient reports standing is better.  Patient wears her SI belt consistently.  Patient has not been able to do her exercises that were given to her in the past in therapy due to the pain she  is in.  Rioght hip strength is 3/5.  Right ilium is rotated posteriorly and was able to be placed in alignmentment with muscle energy.  Patient is a moderately complex evaluation with evolving pain and comorbidities including elos-danos syndrome, hypermobility syndrome and hyper mobilie SI joint that will impact her care  provided.  Patient will benefit from skilled therpay to reduce pain and improve stability.    Rehab Potential Good   Clinical Impairments Affecting Rehab Potential Ehlos-Danos syndrome   PT Frequency 1x / week   PT Duration 8 weeks   PT Treatment/Interventions Moist Heat;Electrical Stimulation;Ultrasound;Functional mobility training;Patient/family education;Neuromuscular re-education;Therapeutic exercise;Therapeutic activities;Manual techniques;Energy conservation;Dry needling;Cryotherapy   PT Next Visit Plan pilates for core stabilization at very low level   PT Home Exercise Plan progress as needed   Recommended Other Services None   Consulted and Agree with Plan of Care Patient      Patient will benefit from skilled therapeutic intervention in order to improve the following deficits and impairments:  Pain, Increased muscle spasms, Decreased strength, Decreased activity tolerance, Decreased endurance  Visit Diagnosis: Muscle weakness (generalized) - Plan: PT plan of care cert/re-cert  Other muscle spasm - Plan: PT plan of care cert/re-cert  Hypermobility syndrome - Plan: PT plan of care cert/re-cert     Problem List Patient Active Problem List   Diagnosis Date Noted  . Chronic cough 12/02/2015  . Irritable larynx syndrome 12/02/2015  . Genetic testing 09/21/2015  . Family history of breast cancer in female 08/26/2015  . Fibrocystic breast changes of both breasts 07/19/2015  . Carpal tunnel syndrome 07/07/2015  . Primary arthrosis of first carpometacarpal joints, bilateral 06/01/2015  . Ehlers-Danlos syndrome 05/11/2015  . Sacroiliac joint dysfunction of right side 11/26/2014  . Hypermobility syndrome 11/26/2014  . Essential alopecia of women 04/14/2011  . Inflammatory dermatosis 04/14/2011    Earlie Counts, PT 02/03/16 10:23 AM   [facility] [address] [place], [place], [address] Phone: [phone]   Fax:   [phone]  Name: Lindsey [name] MRN: [id number] Date of Birth: May 23, 1965

## 2016-02-03 NOTE — Patient Instructions (Addendum)
Slow Contraction: Gravity Resisted (Sitting)    Sitting, slowly squeeze pelvic floor for _5__ seconds. Rest for _5__ seconds. Repeat _5__ times. Do _2__ times a day.  Copyright  VHI. All rights reserved.  Isometric Hold (Sitting)    Sit upright. Slowly inhale, and then exhale. Pull navel toward spine and Hold for _5__ seconds. Continue to breathe in and out during hold. Rest for _5__ seconds. Repeat __5_ times. Do _2__ times a day.  Copyright  VHI. All rights reserved.  Gluteal squeeze, contract 5 seconds 5 times 2 times per day. [facility] [address] [place], [place] [address] Phone # [phone] Fax [phone]

## 2016-02-05 ENCOUNTER — Encounter: Payer: Self-pay | Admitting: Family Medicine

## 2016-02-08 MED ORDER — LEVOCETIRIZINE DIHYDROCHLORIDE 5 MG PO TABS: 5.0000 mg | ORAL_TABLET | Freq: Every evening | ORAL | 2 refills | Status: DC

## 2016-02-08 NOTE — Telephone Encounter (Signed)
Dr Brigitte Pulse, pt has seen you this month for check up, but don't see this med discussed for some time. Do you want to RF?

## 2016-02-09 NOTE — Telephone Encounter (Signed)
Have we completed these forms today is the 5th day just wanted to check the status? Thank you

## 2016-02-10 ENCOUNTER — Telehealth: Payer: Self-pay

## 2016-02-10 NOTE — Telephone Encounter (Signed)
[employer] needs Short Term Disability forms completed by Dr Brigitte Pulse, I have completed what I could from the [place] notes and highlighted the areas that need to be updated. I will place the forms in your box on 02/10/16 if you could please return them to the FMLA/Disability box at the 102 checkout desk within 5-7 business days. Thank you.

## 2016-02-15 MED ORDER — MONTELUKAST SODIUM 10 MG PO TABS: 10.0000 mg | ORAL_TABLET | Freq: Every day | ORAL | 3 refills | Status: DC

## 2016-02-15 NOTE — Telephone Encounter (Signed)
Today is the 9th day have we completed these forms because I have not gotten a copy of them. The patient needs these turned in by the 19th so she isn't denied. Please let me know when you think you will be able to get them to me. Thank you

## 2016-02-16 ENCOUNTER — Encounter: Payer: Self-pay | Admitting: Physician Assistant

## 2016-02-16 DIAGNOSIS — M5416 Radiculopathy, lumbar region: Secondary | ICD-10-CM

## 2016-02-16 HISTORY — DX: Radiculopathy, lumbar region: M54.16

## 2016-02-16 NOTE — Telephone Encounter (Signed)
done

## 2016-02-19 ENCOUNTER — Other Ambulatory Visit: Payer: Self-pay | Admitting: Family Medicine

## 2016-02-19 MED FILL — ESTRADIOL 0.05 MG PATCH: 0.05 | 84 days supply | Qty: 24 | Fill #1

## 2016-02-19 MED FILL — traZODone HCL 50 MG TABS: 50 | 90 days supply | Qty: 270 | Fill #2

## 2016-02-19 MED FILL — PROGESTERONE 100 MG CAPSULE: 100 | 90 days supply | Qty: 90 | Fill #1

## 2016-02-19 MED FILL — OSCIMIN SR 0.375 MG TABLET: 0.375 | 90 days supply | Qty: 180 | Fill #0

## 2016-02-19 MED FILL — MELOXICAM 15 MG TABLET: 15 | 90 days supply | Qty: 90 | Fill #0

## 2016-02-19 NOTE — Telephone Encounter (Signed)
Paperwork scanned and faxed to [facility] on 02/19/16.

## 2016-02-19 NOTE — Telephone Encounter (Signed)
Paperwork was scanned and faxed to [employer] on 02/19/16

## 2016-02-26 ENCOUNTER — Other Ambulatory Visit: Payer: Self-pay | Admitting: Emergency Medicine

## 2016-02-26 MED ORDER — CYPROHEPTADINE HCL 4 MG PO TABS: 4.0000 mg | ORAL_TABLET | Freq: Three times a day (TID) | ORAL | 0 refills | Status: DC | PRN

## 2016-02-26 MED FILL — CYPROHEPTADINE 4 MG TABLET: 4 | 30 days supply | Qty: 90 | Fill #0

## 2016-03-04 ENCOUNTER — Ambulatory Visit (INDEPENDENT_AMBULATORY_CARE_PROVIDER_SITE_OTHER): Payer: 59 | Admitting: Family Medicine

## 2016-03-04 VITALS — BP 110/68 | HR 80 | Temp 98.4°F | Resp 18 | Ht 66.5 in | Wt 144.0 lb

## 2016-03-04 DIAGNOSIS — M357 Hypermobility syndrome: Secondary | ICD-10-CM | POA: Diagnosis not present

## 2016-03-04 DIAGNOSIS — M533 Sacrococcygeal disorders, not elsewhere classified: Secondary | ICD-10-CM

## 2016-03-04 MED ORDER — LEVOCETIRIZINE DIHYDROCHLORIDE 5 MG PO TABS: 5.0000 mg | ORAL_TABLET | Freq: Every evening | ORAL | 2 refills | Status: DC

## 2016-03-04 MED ORDER — MELOXICAM 15 MG PO TABS: 15.0000 mg | ORAL_TABLET | Freq: Every day | ORAL | 1 refills | Status: DC

## 2016-03-04 MED ORDER — GABAPENTIN 300 MG PO CAPS: 300.0000 mg | ORAL_CAPSULE | Freq: Every day | ORAL | 3 refills | Status: DC

## 2016-03-04 MED ORDER — TRAZODONE HCL 50 MG PO TABS: ORAL_TABLET | ORAL | 1 refills | Status: DC

## 2016-03-04 MED ORDER — DULOXETINE HCL 60 MG PO CPEP: 60.0000 mg | ORAL_CAPSULE | Freq: Every day | ORAL | 1 refills | Status: DC

## 2016-03-04 MED ORDER — TRAMADOL HCL 50 MG PO TABS: ORAL_TABLET | ORAL | 1 refills | Status: DC

## 2016-03-04 MED ORDER — MONTELUKAST SODIUM 10 MG PO TABS: 10.0000 mg | ORAL_TABLET | Freq: Every day | ORAL | 3 refills | Status: DC

## 2016-03-04 MED ORDER — HYOSCYAMINE SULFATE ER 0.375 MG PO TB12
0.3750 mg | ORAL_TABLET | Freq: Two times a day (BID) | ORAL | 1 refills | Status: DC
Start: 2016-03-04 — End: 2016-06-08

## 2016-03-04 MED FILL — MONTELUKAST SOD 10 MG TAB: 10 | 90 days supply | Qty: 90 | Fill #0

## 2016-03-04 NOTE — Progress Notes (Addendum)
By signing my name below, I, Lindsey [name], attest that this documentation has been prepared under the direction and in the presence of Lindsey Cheadle, MD.  Electronically Signed: Verlee [name], Medical Scribe. 03/04/16. 2:34 PM.  Subjective:    Patient ID: Lindsey [name], female    DOB: Dec 25, 1965, 50 y.o.   MRN: [id number]  HPI Chief Complaint  Patient presents with  . Follow-up    FMLA paperwork,Disability paperwork    HPI Comments: Lindsey [name] is a 50 y.o. female who presents to the [facility] for paperwork. Pt would like to get a new FMLA disability form for parking since the frst one was denied due to having white out on the address section of the application. Pt mentions she has to follow up with Lindsey [name]. Pt has an appt with Dr. Ferdinand Lango in Jan, and she has PT a couple of months out with [facility] in [place]. Pt has red light therapy a couple of times a week which helps her feel better for a few days, then she'll feel intermittent pain until she has therapy again.  Pt would like to work 1-2 days a week for 3 weeks, then 3 days for 2 weeks and then for 4 days. Pt hasn't been to work since her manager took her off the schedule Nov 1st until she has a note from a physician saying she can "ease her way to work". 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 When she was going to go back to work she had a cold with asthma, and had to lay in bed for her dental surgery which worsened her sacroiliac joint dysfunction. Pt states she's been too sick to get her medicine so she would ike to get written Rx for 90 days so she can just mail it off. She originally started taking 1 cymbalta, then went back to 2 tablets and she finds relief when she pairs it with 100mg  of tylenol and a muscle relaxer.  Patient Active Problem List   Diagnosis Date Noted  . Lumbar radiculopathy 02/16/2016  . Chronic cough 12/02/2015  .  Irritable larynx syndrome 12/02/2015  . Genetic testing 09/21/2015  . Family history of breast cancer in female 08/26/2015  . Fibrocystic breast changes of both breasts 07/19/2015  . Carpal tunnel syndrome 07/07/2015  . Primary arthrosis of first carpometacarpal joints, bilateral 06/01/2015  . Ehlers-Danlos syndrome 05/11/2015  . Sacroiliac joint dysfunction of right side 11/26/2014  . Hypermobility syndrome 11/26/2014  . Essential alopecia of women 04/14/2011  . Inflammatory dermatosis 04/14/2011   Past Medical History:  Diagnosis Date  . Asthma   . Carpal tunnel syndrome 07/07/2015   right   History reviewed. No pertinent surgical history. Allergies  Allergen Reactions  . Poison Oak Extract [Poison Oak Extract] Anaphylaxis  . Penicillins   . Sulfa Antibiotics    Prior to Admission medications   Medication Sig Start Date End Date Taking? Authorizing Provider  Albuterol Sulfate 108 (90 BASE) MCG/ACT AEPB Inhale into the lungs.   Yes Historical Provider, MD  Calcium Carbonate-Vitamin D3 600-400 MG-UNIT TABS Take by mouth.   Yes Historical Provider, MD  CycloSPORINE (RESTASIS OP) Apply to eye.   Yes Historical Provider, MD  DULoxetine (CYMBALTA) 30 MG capsule Take 1 capsule (30 mg total) by mouth daily. Increase to 2 tabs after 2 weeks 01/27/16  Yes Shawnee Knapp,  MD  estradiol (VIVELLE-DOT) 0.05 MG/24HR patch  09/10/15  Yes Historical Provider, MD  gabapentin (NEURONTIN) 300 MG capsule Take 1 capsule (300 mg total) by mouth at bedtime. 11/28/15  Yes Shawnee Knapp, MD  hyoscyamine (OSCIMIN SR) 0.375 MG 12 hr tablet Take 1 tablet (0.375 mg total) by mouth 2 (two) times daily. 02/02/16  Yes Shawnee Knapp, MD  ipratropium (ATROVENT) 0.03 % nasal spray Place 2 sprays into both nostrils every 12 (twelve) hours.   Yes Historical Provider, MD  levocetirizine (XYZAL) 5 MG tablet Take 1 tablet (5 mg total) by mouth every evening. 02/08/16  Yes Shawnee Knapp, MD  Melatonin 10 MG TABS Take by mouth.   Yes  Historical Provider, MD  meloxicam (MOBIC) 15 MG tablet Take 1 tablet (15 mg total) by mouth daily. 02/02/16  Yes Shawnee Knapp, MD  montelukast (SINGULAIR) 10 MG tablet Take 1 tablet (10 mg total) by mouth at bedtime. 02/15/16  Yes Shawnee Knapp, MD  mupirocin ointment (BACTROBAN) 2 % Apply 1 application topically 3 (three) times daily. 07/02/15  Yes Ezekiel Slocumb, PA-C  progesterone (PROMETRIUM) 100 MG capsule  09/10/15  Yes Historical Provider, MD  traMADol (ULTRAM) 50 MG tablet Take 2 tabs three times a day 11/28/15  Yes Shawnee Knapp, MD  traZODone (DESYREL) 50 MG tablet Take 2-3 tabs po QHS for sleep. 02/02/16  Yes Shawnee Knapp, MD  cyproheptadine (PERIACTIN) 4 MG tablet Take 1 tablet (4 mg total) by mouth 3 (three) times daily as needed for allergies. Patient not taking: Reported on 03/04/2016 02/26/16   Shawnee Knapp, MD  hyoscyamine (LEVBID) 0.375 MG 12 hr tablet Take 0.375 mg by mouth 2 (two) times daily.    Historical Provider, MD   Social History   Social History  . Marital status: Single    Spouse name: n/a  . Number of children: 0  . Years of education: N/A   Occupational History  . Pharmacy Technician     [employer]   Social History Main Topics  . Smoking status: Never Smoker  . Smokeless tobacco: Never Used  . Alcohol use No  . Drug use: No  . Sexual activity: Not on file   Other Topics Concern  . Not on file   Social History Narrative   Lives with a boyfriend.   Depression screen [facility] 2/9 03/04/2016 01/27/2016 01/05/2016 11/28/2015 10/16/2015  Decreased Interest 0 0 0 0 0  Down, Depressed, Hopeless 0 0 0 0 0  PHQ - 2 Score 0 0 0 0 0    Review of Systems  Constitutional: Positive for activity change, fatigue and unexpected weight change. Negative for appetite change and fever.  Musculoskeletal: Positive for arthralgias (chronic), back pain, gait problem, joint swelling, myalgias, neck pain and neck stiffness.  Neurological: Positive for headaches. Negative for  dizziness and light-headedness.  Psychiatric/Behavioral: Negative for agitation, dysphoric mood and sleep disturbance. The patient is not nervous/anxious.   All other systems reviewed and are negative.  Objective:  Physical Exam  Constitutional: She appears well-developed and well-nourished. No distress.  HENT:  Head: Normocephalic and atraumatic.  Eyes: Conjunctivae are normal.  Neck: Neck supple.  Cardiovascular: Normal rate.   Pulmonary/Chest: Effort normal.  Neurological: She is alert.  Skin: Skin is warm and dry.  Psychiatric: She has a normal mood and affect. Her behavior is normal.  Nursing note and vitals reviewed.  BP 110/68 (BP Location: Right Arm, Patient Position: Sitting, Cuff  Size: Small)   Pulse 80   Temp 98.4 F (36.9 C) (Oral)   Resp 18   Ht 5' 6.5" (1.689 m)   Wt 144 lb (65.3 kg)   LMP 04/08/2014   SpO2 98%   BMI 22.89 kg/m  Assessment & Plan:   1. Sacroiliac joint dysfunction of right side   2. Hypermobility syndrome    I had written pt out of work from mid Oct 10/18 through 11/1 but now has had to stay out through today - 11/24 due to dental surgery and URI.  Requests that her FMLA and disability be adjusted to reflect this additional time. Pt's supervisor has informed pt of some new work flows which pt is concerned will result in worsened sxs.  Rewrote pts medical recommendations for her employer - see letter tab - and suggested pt start by working 1-2 shifts/wk over the next 3 wks, 3 shifts/wk over the next 2 wks, then increase to 4 shifts a day which should be her max (though pt reports she is still being worked 5).  Cont PT. Has initial intake appt with pyschology at [facility] - Dr. Kyra Leyland in Jan - about 6 wks.  I do not think she gave an adequate trial of cymbalta 60 so increase back from 30 to 60 for at least 2 mos.  Pt would like to change all her chronic meds to mail order [facility] from the [facility] o/p pharmacy so resent all   Meds ordered this  encounter  Medications  . DISCONTD: DULoxetine (CYMBALTA) 60 MG capsule    Sig: Take 1 capsule (60 mg total) by mouth daily. Increase to 2 tabs after 2 weeks    Dispense:  90 capsule    Refill:  1  . DULoxetine (CYMBALTA) 60 MG capsule    Sig: Take 1 capsule (60 mg total) by mouth daily. Increase to 2 tabs after 2 weeks    Dispense:  90 capsule    Refill:  1  . gabapentin (NEURONTIN) 300 MG capsule    Sig: Take 1 capsule (300 mg total) by mouth at bedtime.    Dispense:  90 capsule    Refill:  3  . hyoscyamine (OSCIMIN SR) 0.375 MG 12 hr tablet    Sig: Take 1 tablet (0.375 mg total) by mouth 2 (two) times daily.    Dispense:  180 tablet    Refill:  1  . levocetirizine (XYZAL) 5 MG tablet    Sig: Take 1 tablet (5 mg total) by mouth every evening.    Dispense:  90 tablet    Refill:  2    patient would like a 90 day supply  . meloxicam (MOBIC) 15 MG tablet    Sig: Take 1 tablet (15 mg total) by mouth daily.    Dispense:  90 tablet    Refill:  1  . montelukast (SINGULAIR) 10 MG tablet    Sig: Take 1 tablet (10 mg total) by mouth at bedtime.    Dispense:  90 tablet    Refill:  3  . traMADol (ULTRAM) 50 MG tablet    Sig: Take 2 tabs three times a day    Dispense:  540 tablet    Refill:  1  . traZODone (DESYREL) 50 MG tablet    Sig: Take 2-3 tabs po QHS for sleep.    Dispense:  270 tablet    Refill:  1   Over 25 min spent in face-to-face evaluation of and consultation with  patient and coordination of care.  Over 50% of this time was spent counseling this patient.   I personally performed the services described in this documentation, which was scribed in my presence. The recorded information has been reviewed and considered, and addended by me as needed.   Lindsey [name], M.D.  [facility] [address] [place], [place] [address] [phone] phone [phone] fax  03/05/16 12:12 AM

## 2016-03-04 NOTE — Patient Instructions (Signed)
     IF you received an x-ray today, you will receive an invoice from [facility]. Please contact [facility] at [phone] with questions or concerns regarding your invoice.   IF you received labwork today, you will receive an invoice from [facility]. Please contact [facility] at [phone] with questions or concerns regarding your invoice.   Our billing staff will not be able to assist you with questions regarding bills from these companies.  You will be contacted with the lab results as soon as they are available. The fastest way to get your results is to activate your My Chart account. Instructions are located on the last page of this paperwork. If you have not heard from us regarding the results in 2 weeks, please contact this office.      

## 2016-03-07 ENCOUNTER — Encounter: Payer: Self-pay | Admitting: Physical Therapy

## 2016-03-07 ENCOUNTER — Ambulatory Visit: Payer: 59 | Attending: Family Medicine | Admitting: Physical Therapy

## 2016-03-07 DIAGNOSIS — M62838 Other muscle spasm: Secondary | ICD-10-CM | POA: Diagnosis not present

## 2016-03-07 DIAGNOSIS — M357 Hypermobility syndrome: Secondary | ICD-10-CM | POA: Diagnosis not present

## 2016-03-07 DIAGNOSIS — Q796 Ehlers-Danlos syndrome, unspecified: Secondary | ICD-10-CM

## 2016-03-07 DIAGNOSIS — M6281 Muscle weakness (generalized): Secondary | ICD-10-CM | POA: Insufficient documentation

## 2016-03-07 NOTE — Therapy (Addendum)
[facility] [address] [place], [place], [address] Phone: [phone]   Fax:  [phone]  Physical Therapy Treatment  Patient Details  Name: Lindsey [name] MRN: [id number] Date of Birth: 05-15-1965 Referring Provider: Dr. Mammie Lorenzo  Encounter Date: 03/07/2016      PT End of Session - 03/07/16 1459    Visit Number 2   Date for PT Re-Evaluation 03/30/16   PT Start Time 1459  Pt 15 min late   PT Stop Time 1530   PT Time Calculation (min) 31 min   Activity Tolerance Patient tolerated treatment well   Behavior During Therapy [facility] for tasks assessed/performed      Past Medical History:  Diagnosis Date  . Asthma   . Carpal tunnel syndrome 07/07/2015   right    History reviewed. No pertinent surgical history.  There were no vitals filed for this visit.      Subjective Assessment - 03/07/16 1500    Subjective Pt is also doing red light therapy for her chronic pain. Pt states this is helping her pain.    Currently in Pain? Yes   Pain Score 2    Pain Location Sacrum   Pain Orientation Right   Aggravating Factors  Walking, standing   Pain Relieving Factors rest, SI belt   Multiple Pain Sites No                         [facility] Adult PT Treatment/Exercise - 03/07/16 0001      Lumbar Exercises: Supine   Ab Set 10 reps;3 seconds                PT Education - 03/07/16 1522    Education provided Yes   Education Details HEP: supine or sitting TA contraction, seated isometric hip add/abd 3 sec hold 3x each, review of current gym exercises   Person(s) Educated Patient   Methods Explanation;Demonstration;Tactile cues;Verbal cues   Comprehension Verbalized understanding;Returned demonstration          PT Short Term Goals - 03/07/16 1527      PT SHORT TERM GOAL #1   Title She will be independent with inital HEP      Time 4   Period Weeks   Status On-going     PT SHORT TERM GOAL  #2   Title She will report pain decrease 25% or more with transitional movements (esp sitting)   Time 4   Period Weeks   Status On-going     PT SHORT TERM GOAL #3   Title Pt will be able to report less pain end of work day (25% less)    Time 4   Period Weeks   Status On-going     PT SHORT TERM GOAL #4   Title Pt will understand concepts of stability and posture as it relates to her joint condition.    Time 4   Period Weeks   Status Achieved           PT Long Term Goals - 02/03/16 1006      PT LONG TERM GOAL #1   Title independent with intial and all HEP/ gym program    Time 8   Period Weeks   Status New     PT LONG TERM GOAL #2   Title Pt  will report pain decreased 50% or more and able to drive to work with minimal pain increase   Time  8   Period Weeks   Status New     PT LONG TERM GOAL #3   Title Pt will be able to stand for up to an 30 min with min pain increase   Time 8   Period Weeks   Status New     PT LONG TERM GOAL #4   Title Pt will be able to do stairs without pain increase for community mobility   Time 8   Period Weeks   Status New     PT LONG TERM GOAL #5   Title Hip and core strength improved to 4+/5 needed for standing and walking longer periods of time at work and in the community   Time 8   Period Weeks   Status New               Plan - 03/07/16 1500    Clinical Impression Statement Pt presents with Rt ASIS protruding anteriorly and not on the LT. Pt reports the red light therapy has helped the SI joint pop back in and calm the pain down.  The levels is pt's ASIS are aligned in a horizontal plane but the RT ASIS is absolutley forward to the LT in the frontal plane.  Pt demonstrated the ability to tolerate low level core stabs and seated isometric hip add/abd without increasing pain and will continue this as part of her HEP.    Rehab Potential Good   Clinical Impairments Affecting Rehab Potential Ehlos-Danos syndrome   PT Frequency 1x  / week   PT Duration 8 weeks   PT Treatment/Interventions Moist Heat;Electrical Stimulation;Ultrasound;Functional mobility training;Patient/family education;Neuromuscular re-education;Therapeutic exercise;Therapeutic activities;Manual techniques;Energy conservation;Dry needling;Cryotherapy   PT Next Visit Plan pilates for core stabilization at very low level, review last HEP   Consulted and Agree with Plan of Care --      Patient will benefit from skilled therapeutic intervention in order to improve the following deficits and impairments:  Pain, Increased muscle spasms, Decreased strength, Decreased activity tolerance, Decreased endurance  Visit Diagnosis: Muscle weakness (generalized)  Other muscle spasm  Hypermobility syndrome  Ehlers-Danlos syndrome     Problem List Patient Active Problem List   Diagnosis Date Noted  . Lumbar radiculopathy 02/16/2016  . Chronic cough 12/02/2015  . Irritable larynx syndrome 12/02/2015  . Genetic testing 09/21/2015  . Family history of breast cancer in female 08/26/2015  . Fibrocystic breast changes of both breasts 07/19/2015  . Carpal tunnel syndrome 07/07/2015  . Primary arthrosis of first carpometacarpal joints, bilateral 06/01/2015  . Ehlers-Danlos syndrome 05/11/2015  . Sacroiliac joint dysfunction of right side 11/26/2014  . Hypermobility syndrome 11/26/2014  . Essential alopecia of women 04/14/2011  . Inflammatory dermatosis 04/14/2011    Nadie Fiumara, PTA 03/07/2016, 4:09 PM  [facility] [address], [place], [place], [address] Phone: [phone]   Fax:  [phone]  Name: Lindsey [name] MRN: [id number] Date of Birth: July 27, 1965  PHYSICAL THERAPY DISCHARGE SUMMARY  Visits from Start of Care: 2  Current functional level related to goals / functional outcomes: See above.     Remaining deficits: See above.  Unable to assess patient due to not returning  after last visit.    Education / Equipment: HEP Plan:  Patient goals were not met. Patient is being discharged due to not returning since the last visit. thank you for the referral. Earlie Counts, PT 06/14/17 9:37 AM   ?????

## 2016-03-11 ENCOUNTER — Encounter: Payer: 59 | Admitting: Physical Therapy

## 2016-03-14 ENCOUNTER — Encounter: Payer: 59 | Admitting: Physical Therapy

## 2016-03-18 ENCOUNTER — Encounter: Payer: 59 | Admitting: Physical Therapy

## 2016-03-21 ENCOUNTER — Ambulatory Visit: Payer: 59 | Attending: Family Medicine | Admitting: Physical Therapy

## 2016-03-21 ENCOUNTER — Telehealth: Payer: Self-pay | Admitting: Physical Therapy

## 2016-03-23 ENCOUNTER — Encounter: Payer: 59 | Admitting: Physical Therapy

## 2016-03-24 ENCOUNTER — Other Ambulatory Visit: Payer: Self-pay | Admitting: Family Medicine

## 2016-03-28 ENCOUNTER — Ambulatory Visit: Payer: 59 | Admitting: Physical Therapy

## 2016-03-30 ENCOUNTER — Ambulatory Visit: Payer: 59 | Admitting: Physical Therapy

## 2016-04-06 ENCOUNTER — Encounter: Payer: 59 | Admitting: Physical Therapy

## 2016-04-19 ENCOUNTER — Ambulatory Visit: Payer: 59 | Admitting: Psychiatry

## 2016-05-30 ENCOUNTER — Encounter: Payer: Self-pay | Admitting: Family Medicine

## 2016-06-07 ENCOUNTER — Telehealth: Payer: Self-pay

## 2016-06-07 NOTE — Telephone Encounter (Signed)
Pt called following up on her mychart message concerning her medications that she would like ePrescribed or faxed. She did say she was able to get the Progesterone and Estradiol from her OBGYN and does not need those. She was checking in on the status of the other prescriptions.

## 2016-06-08 ENCOUNTER — Encounter: Payer: Self-pay | Admitting: Physician Assistant

## 2016-06-08 ENCOUNTER — Ambulatory Visit (INDEPENDENT_AMBULATORY_CARE_PROVIDER_SITE_OTHER): Payer: 59 | Admitting: Emergency Medicine

## 2016-06-08 VITALS — BP 110/76 | HR 76 | Temp 98.1°F | Resp 18 | Ht 66.5 in | Wt 140.0 lb

## 2016-06-08 DIAGNOSIS — G894 Chronic pain syndrome: Secondary | ICD-10-CM

## 2016-06-08 DIAGNOSIS — Q796 Ehlers-Danlos syndrome, unspecified: Secondary | ICD-10-CM

## 2016-06-08 MED ORDER — TRAZODONE HCL 50 MG PO TABS: ORAL_TABLET | ORAL | 1 refills | Status: DC

## 2016-06-08 MED ORDER — HYOSCYAMINE SULFATE ER 0.375 MG PO TB12: 0.3750 mg | ORAL_TABLET | Freq: Two times a day (BID) | ORAL | 1 refills | Status: DC

## 2016-06-08 MED ORDER — MELOXICAM 15 MG PO TABS: 15.0000 mg | ORAL_TABLET | Freq: Every day | ORAL | 1 refills | Status: DC

## 2016-06-08 MED ORDER — OXYCODONE-ACETAMINOPHEN 5-325 MG PO TABS: 1.0000 | ORAL_TABLET | Freq: Three times a day (TID) | ORAL | 0 refills | Status: DC | PRN

## 2016-06-08 MED ORDER — IPRATROPIUM BROMIDE 0.06 % NA SOLN: 2.0000 | Freq: Two times a day (BID) | NASAL | 3 refills | Status: DC

## 2016-06-08 MED ORDER — LEVOCETIRIZINE DIHYDROCHLORIDE 5 MG PO TABS: 5.0000 mg | ORAL_TABLET | Freq: Every evening | ORAL | 2 refills | Status: DC

## 2016-06-08 MED ORDER — MONTELUKAST SODIUM 10 MG PO TABS: 10.0000 mg | ORAL_TABLET | Freq: Every day | ORAL | 3 refills | Status: DC

## 2016-06-08 MED ORDER — TRAMADOL HCL 50 MG PO TABS: ORAL_TABLET | ORAL | 1 refills | Status: DC

## 2016-06-08 MED ORDER — GABAPENTIN 300 MG PO CAPS: 300.0000 mg | ORAL_CAPSULE | Freq: Every day | ORAL | 3 refills | Status: DC

## 2016-06-08 MED ORDER — ALBUTEROL SULFATE 108 (90 BASE) MCG/ACT IN AEPB: 1.0000 | INHALATION_SPRAY | RESPIRATORY_TRACT | 3 refills | Status: DC | PRN

## 2016-06-08 NOTE — Patient Instructions (Signed)
     IF you received an x-ray today, you will receive an invoice from [facility]. Please contact [facility] at [phone] with questions or concerns regarding your invoice.   IF you received labwork today, you will receive an invoice from [facility]. Please contact [facility] at [phone] with questions or concerns regarding your invoice.   Our billing staff will not be able to assist you with questions regarding bills from these companies.  You will be contacted with the lab results as soon as they are available. The fastest way to get your results is to activate your My Chart account. Instructions are located on the last page of this paperwork. If you have not heard from us regarding the results in 2 weeks, please contact this office.     

## 2016-06-08 NOTE — Progress Notes (Signed)
[name] 51 y.o.   Chief Complaint  Patient presents with  . Medication Refill    All meds except the progesterone,estradiol    HISTORY OF PRESENT ILLNESS: This is a 51 y.o. female with multiple chronic medical conditions that includes chronic pain, needs multiple medication refills. States she's "in a lot of pain" and is requesting some medication to help her the next couple of days.  Medication Refill  This is a chronic problem. The current episode started more than 1 year ago. The problem has been waxing and waning. Associated symptoms include arthralgias, joint swelling and myalgias. Pertinent negatives include no abdominal pain, chest pain, chills, fever, nausea, rash or vomiting.     Prior to Admission medications   Medication Sig Start Date End Date Taking? Authorizing Provider  Albuterol Sulfate 108 (90 Base) MCG/ACT AEPB Inhale 1-2 puffs into the lungs every 4 (four) hours as needed. 06/08/16  Yes Horald Pollen, MD  Calcium Carbonate-Vitamin D3 600-400 MG-UNIT TABS Take by mouth.   Yes Historical Provider, MD  CycloSPORINE (RESTASIS OP) Apply to eye.   Yes Historical Provider, MD  DULoxetine (CYMBALTA) 60 MG capsule Take 1 capsule (60 mg total) by mouth daily. Increase to 2 tabs after 2 weeks 03/04/16  Yes Shawnee Knapp, MD  estradiol (VIVELLE-DOT) 0.05 MG/24HR patch  09/10/15  Yes Historical Provider, MD  gabapentin (NEURONTIN) 300 MG capsule Take 1 capsule (300 mg total) by mouth at bedtime. 06/08/16  Yes [facility], MD  hyoscyamine (OSCIMIN SR) 0.375 MG 12 hr tablet Take 1 tablet (0.375 mg total) by mouth 2 (two) times daily. 06/08/16  Yes [facility], MD  ipratropium (ATROVENT) 0.06 % nasal spray Place 2 sprays into both nostrils every 12 (twelve) hours. 06/08/16  Yes [facility]ardenbrook Joellen Jersey, MD  levocetirizine (XYZAL) 5 MG tablet Take 1 tablet (5 mg total) by mouth every evening. 06/08/16  Yes Horald Pollen, MD  Melatonin 10 MG TABS Take by mouth.    Yes Historical Provider, MD  meloxicam (MOBIC) 15 MG tablet Take 1 tablet (15 mg total) by mouth daily. 06/08/16  Yes Athelene Hursey Joellen Jersey, MD  montelukast (SINGULAIR) 10 MG tablet Take 1 tablet (10 mg total) by mouth at bedtime. 06/08/16  Yes [facility], MD  mupirocin ointment (BACTROBAN) 2 % Apply 1 application topically 3 (three) times daily. 07/02/15  Yes Bennett Scrape V, PA-C  progesterone (PROMETRIUM) 100 MG capsule  09/10/15  Yes Historical Provider, MD  traMADol (ULTRAM) 50 MG tablet Take 2 tabs three times a day 06/08/16  Yes [facility], MD  traZODone (DESYREL) 50 MG tablet Take 2-3 tabs po QHS for sleep. 06/08/16  Yes [facility] Joellen Jersey, MD  oxyCODONE-acetaminophen (ROXICET) 5-325 MG tablet Take 1 tablet by mouth every 8 (eight) hours as needed for severe pain. 06/08/16   Horald Pollen, MD    Allergies  Allergen Reactions  . Poison Oak Extract [Poison Oak Extract] Anaphylaxis  . Penicillins   . Sulfa Antibiotics     Patient Active Problem List   Diagnosis Date Noted  . Lumbar radiculopathy 02/16/2016  . Chronic cough 12/02/2015  . Irritable larynx syndrome 12/02/2015  . Genetic testing 09/21/2015  . Family history of breast cancer in female 08/26/2015  . Fibrocystic breast changes of both breasts 07/19/2015  . Carpal tunnel syndrome 07/07/2015  . Primary arthrosis of first carpometacarpal joints, bilateral 06/01/2015  . Ehlers-Danlos syndrome 05/11/2015  . Sacroiliac joint dysfunction of right side  11/26/2014  . Hypermobility syndrome 11/26/2014  . Essential alopecia of women 04/14/2011  . Inflammatory dermatosis 04/14/2011    Past Medical History:  Diagnosis Date  . Asthma   . Carpal tunnel syndrome 07/07/2015   right    History reviewed. No pertinent surgical history.  Social History   Social History  . Marital status: Single    Spouse name: n/a  . Number of children: 0  . Years of education: N/A   Occupational History  . Pharmacy  Technician     [employer]   Social History Main Topics  . Smoking status: Never Smoker  . Smokeless tobacco: Never Used  . Alcohol use No  . Drug use: No  . Sexual activity: Not on file   Other Topics Concern  . Not on file   Social History Narrative   Lives with a boyfriend.    Family History  Problem Relation Age of Onset  . Hypertension Mother   . Other Mother     hx of hysterectomy at 81-56 for prolapsed uterus; hx of benign L arm cyst in her 58s  . Stroke Father   . Hypertension Father   . Skin cancer Father 21    NOS  . Other Father     enlarged prostate s/p surgery  . Alzheimer's disease Maternal Aunt   . Stomach cancer Paternal Aunt     d. 3; was a Manufacturing engineer and did not go to the doctor, so not sure age of onset  . Congestive Heart Failure Maternal Grandmother   . Prostate cancer Maternal Grandfather     dx. 75-76  . Heart Problems Paternal Grandmother   . Heart Problems Paternal Grandfather   . Colon cancer Other     maternal great aunt (MGM's sister) dx in her 72s  . Heart attack Paternal Uncle 60  . Heart Problems Paternal Uncle   . Angina Paternal Uncle   . Breast cancer Cousin     paternal 1st cousin d. 65  . Breast cancer Cousin     paternal 1st cousin dx. 50s     Review of Systems  Constitutional: Negative for chills and fever.  Respiratory: Negative for shortness of breath.   Cardiovascular: Negative for chest pain and palpitations.  Gastrointestinal: Negative for abdominal pain, diarrhea, nausea and vomiting.  Genitourinary: Negative for dysuria and hematuria.  Musculoskeletal: Positive for arthralgias, back pain, joint pain, joint swelling and myalgias.  Skin: Negative for rash.  Neurological: Negative for dizziness, sensory change and focal weakness.  All other systems reviewed and are negative.  Vitals:   06/08/16 1655  BP: 110/76  Pulse: 76  Resp: 18  Temp: 98.1 F (36.7 C)     Physical Exam   Constitutional: She is oriented to person, place, and time. She appears well-developed and well-nourished.  HENT:  Head: Normocephalic and atraumatic.  Eyes: EOM are normal. Pupils are equal, round, and reactive to light.  Neck: Normal range of motion. Neck supple.  Cardiovascular: Normal rate.   Pulmonary/Chest: Effort normal.  Musculoskeletal: Normal range of motion.  Neurological: She is alert and oriented to person, place, and time.  Skin: Skin is warm and dry. Capillary refill takes less than 2 seconds.  Psychiatric: She has a normal mood and affect. Her behavior is normal.  Vitals reviewed.    ASSESSMENT & PLAN: Cecylia was seen today for medication refill.  Diagnoses and all orders for this visit:  Ehlers-Danlos syndrome  Chronic pain syndrome  Other orders -     oxyCODONE-acetaminophen (ROXICET) 5-325 MG tablet; Take 1 tablet by mouth every 8 (eight) hours as needed for severe pain. -     Albuterol Sulfate 108 (90 Base) MCG/ACT AEPB; Inhale 1-2 puffs into the lungs every 4 (four) hours as needed. -     ipratropium (ATROVENT) 0.06 % nasal spray; Place 2 sprays into both nostrils every 12 (twelve) hours. -     traMADol (ULTRAM) 50 MG tablet; Take 2 tabs three times a day -     traZODone (DESYREL) 50 MG tablet; Take 2-3 tabs po QHS for sleep. -     meloxicam (MOBIC) 15 MG tablet; Take 1 tablet (15 mg total) by mouth daily. -     hyoscyamine (OSCIMIN SR) 0.375 MG 12 hr tablet; Take 1 tablet (0.375 mg total) by mouth 2 (two) times daily. -     gabapentin (NEURONTIN) 300 MG capsule; Take 1 capsule (300 mg total) by mouth at bedtime. -     montelukast (SINGULAIR) 10 MG tablet; Take 1 tablet (10 mg total) by mouth at bedtime. -     levocetirizine (XYZAL) 5 MG tablet; Take 1 tablet (5 mg total) by mouth every evening.      Agustina Caroli, MD [facility]

## 2016-06-09 ENCOUNTER — Telehealth: Payer: Self-pay

## 2016-06-09 NOTE — Telephone Encounter (Signed)
Perfect, thanks. I would prefer that she get these from gyn if she is able.

## 2016-06-09 NOTE — Telephone Encounter (Signed)
Dr. Brigitte Pulse, sending you this message FYI I sent pt a mychart msg asking if she needed the Progerterone and Estradiol Will order or not, when she responds.  Thanks, Kalman Shan

## 2016-06-09 NOTE — Telephone Encounter (Signed)
MyChart message Mail order pharm changed; refill on Progesterone, Estradiol, Albuterol, Ipratropium, Tramadol, Trazadone, : Meloxicam, Oscimin, Gabapentin, Montelukast, levocetirizine.  Pt saw Dr Mitchel Honour yesterday and he refilled all meds but Progesterone and Estradiol.

## 2016-06-09 NOTE — Telephone Encounter (Signed)
Thank you Rose - yes please send in a 90d supply without refills and then pt will need to be seen for any additional refills.

## 2016-06-16 ENCOUNTER — Other Ambulatory Visit (HOSPITAL_COMMUNITY): Payer: Self-pay | Admitting: Obstetrics and Gynecology

## 2016-06-16 DIAGNOSIS — R9230 Dense breasts, unspecified: Secondary | ICD-10-CM

## 2016-06-16 DIAGNOSIS — R922 Inconclusive mammogram: Secondary | ICD-10-CM

## 2016-07-05 ENCOUNTER — Other Ambulatory Visit: Payer: Self-pay | Admitting: *Deleted

## 2016-07-05 NOTE — Patient Outreach (Signed)
Left message on Lindsey [name]'s mobile number [phone] requesting return call regarding [facility] follow up. Barrington Ellison RN,CCM,CDE [place] [facility] Coordinator [facility] Office Phone [phone] Office Fax [phone]

## 2016-07-11 ENCOUNTER — Ambulatory Visit (HOSPITAL_COMMUNITY)
Admission: RE | Admit: 2016-07-11 | Discharge: 2016-07-11 | Disposition: A | Discharge status: Home / Self Care | Payer: 59 | Source: Ambulatory Visit | Attending: Obstetrics and Gynecology | Admitting: Obstetrics and Gynecology

## 2016-07-11 DIAGNOSIS — R922 Inconclusive mammogram: Secondary | ICD-10-CM | POA: Diagnosis not present

## 2016-07-11 MED ORDER — GADOBENATE DIMEGLUMINE 529 MG/ML IV SOLN
15.0000 mL | Freq: Once | INTRAVENOUS | Status: AC | PRN
  Administered 2016-07-11: 12 mL via INTRAVENOUS

## 2016-07-13 ENCOUNTER — Encounter: Payer: Self-pay | Admitting: *Deleted

## 2016-07-13 NOTE — Patient Outreach (Signed)
No response from Trey to voice mail left on her mobile number on 3/27 with request to schedule follow up [facility] appointment. Letter composed today and will be mailed to patient's home address requesting same or she will be terminated from the program in 10 business days. Barrington Ellison RN,CCM,CDE [place] Management Coordinator [facility] Office Phone [phone] Office Fax [phone]

## 2016-07-19 ENCOUNTER — Telehealth: Payer: Self-pay

## 2016-07-19 NOTE — Patient Outreach (Addendum)
Called Lindsey [name] to schedule a Link to Wellness appointment, there was no answer, left message for her to call me back.

## 2016-08-01 ENCOUNTER — Ambulatory Visit (INDEPENDENT_AMBULATORY_CARE_PROVIDER_SITE_OTHER): Payer: Self-pay | Admitting: Family Medicine

## 2016-08-01 ENCOUNTER — Encounter: Payer: Self-pay | Admitting: Family Medicine

## 2016-08-01 VITALS — BP 107/73 | HR 87 | Temp 98.1°F | Resp 16 | Ht 66.0 in | Wt 137.0 lb

## 2016-08-01 DIAGNOSIS — M797 Fibromyalgia: Secondary | ICD-10-CM

## 2016-08-01 DIAGNOSIS — Q796 Ehlers-Danlos syndrome, unspecified: Secondary | ICD-10-CM

## 2016-08-01 DIAGNOSIS — G894 Chronic pain syndrome: Secondary | ICD-10-CM

## 2016-08-01 MED ORDER — HYDROCODONE-ACETAMINOPHEN 7.5-325 MG PO TABS: 1.0000 | ORAL_TABLET | Freq: Four times a day (QID) | ORAL | 0 refills | Status: DC | PRN

## 2016-08-01 NOTE — Progress Notes (Signed)
Subjective:    Patient ID: Lindsey [name], female    DOB: 10-Jul-1965, 51 y.o.   MRN: [id number] Chief Complaint  Patient presents with  . Follow-up    chronic issue/ sacroiliaic joint of right side    HPI  Lindsey [name] is a 51 year old woman here to follow-up on her EDS and SI joint dysfunction. I last saw her 5 months prior. She saw my colleague Dr. Mitchel Honour for this 2 months prior requesting refills of all of her chronic medications at that time and additionally complaining of increased baseline pain for which she was giving oxycodone 5 mg #15 but pt reports this was to strong for her.    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  Now she has been using proline Fayette Pho at [place] who also works at [employer] - they injected dextrose into joints. She was told 85% success rate in chronic conditions and made joints stronger. saraffin and saline are rec rather than dextrose which he states will eventually be absorbed (but may actually build up.).  Told pressure and instead it was sharp shooting pains and burning but told it was normal.  She has had prolotheapy and again in 5d. Did bilatreal knees arouynd Fri 3/30, the B shoulder 4/6, then B elbows. Also does neuroprolo when they use numbing (-caine) pluss a lower concentration of dextrose. Has to take Red light therapy helps some. She bought a biomat to produce infrared heat touralamine and amythyst to help with pain - sleeps on it. $6200 and see benefit in 9-12 mos Was also told to max out vit C by Dr. Ace Gins Dr. Gust Rung rec liposomal vit C She is also doing mono and bipolar frequency of energy/light that is supposed to heat up below dermal tissue to help with pain and produce collagen (not much benefit). Also tried  cryotherapy to reduce pain, inflammation, Dr. Daron Offer at [place] - did for 30 sec rather than the 3 mins rec which helped her sleep but it is contraindicated with the prolo therapy as the prolo therapy is supposed to induce a healing response though inflammation so not supposed to take any anti-inflammatories.  She got a powder alb inh instead of the aerosol -  She just needs pain medicine to get ehr through sev d after her injection.  As of 3/1 she was told that she gave her notice. So now they just said that she quit her job.  She is going to apply for Medicaid. She doesn't think she has any insurance as they sent her a letter on 4/11. She wants to apply   [facility] hurts all over - even her muscles. Cants to mammograms. PT causes more pain.  Past Medical History:  Diagnosis Date  . Asthma   . Carpal tunnel syndrome 07/07/2015   right   No past surgical history on file. Current Outpatient Prescriptions on File Prior to Visit  Medication Sig Dispense Refill  . Albuterol Sulfate 108 (90 Base) MCG/ACT AEPB Inhale 1-2 puffs into the lungs every 4 (four) hours as needed. 1 each 3  . CycloSPORINE (RESTASIS OP) Apply to eye.    . DULoxetine (CYMBALTA) 60 MG capsule Take 1 capsule (60 mg total) by mouth daily. Increase  to 2 tabs after 2 weeks 90 capsule 1  . estradiol (VIVELLE-DOT) 0.05 MG/24HR patch   11  . gabapentin (NEURONTIN) 300 MG capsule Take 1 capsule (300 mg total) by mouth at bedtime. 90 capsule 3  . ipratropium (ATROVENT) 0.06 % nasal spray Place 2 sprays into both nostrils every 12 (twelve) hours. 15 mL 3  . levocetirizine (XYZAL) 5 MG tablet Take 1 tablet (5 mg total) by mouth every evening. 90 tablet 2  . Melatonin 10 MG TABS Take by mouth.    . montelukast (SINGULAIR) 10 MG tablet Take 1 tablet (10 mg total) by mouth at bedtime. 90 tablet 3  . mupirocin ointment (BACTROBAN) 2 % Apply 1 application topically 3 (three) times daily. 22 g 1  . progesterone (PROMETRIUM)  100 MG capsule   11  . traMADol (ULTRAM) 50 MG tablet Take 2 tabs three times a day 540 tablet 1  . traZODone (DESYREL) 50 MG tablet Take 2-3 tabs po QHS for sleep. 270 tablet 1  . Calcium Carbonate-Vitamin D3 600-400 MG-UNIT TABS Take by mouth.     No current facility-administered medications on file prior to visit.    Allergies  Allergen Reactions  . Poison Oak Extract [Poison Oak Extract] Anaphylaxis  . Penicillins   . Sulfa Antibiotics    Family History  Problem Relation Age of Onset  . Hypertension Mother   . Other Mother     hx of hysterectomy at 89-56 for prolapsed uterus; hx of benign L arm cyst in her 49s  . Stroke Father   . Hypertension Father   . Skin cancer Father 40    NOS  . Other Father     enlarged prostate s/p surgery  . Alzheimer's disease Maternal Aunt   . Stomach cancer Paternal Aunt     d. 18; was a Manufacturing engineer and did not go to the doctor, so not sure age of onset  . Congestive Heart Failure Maternal Grandmother   . Prostate cancer Maternal Grandfather     dx. 75-76  . Heart Problems Paternal Grandmother   . Heart Problems Paternal Grandfather   . Colon cancer Other     maternal great aunt (MGM's sister) dx in her 38s  . Heart attack Paternal Uncle 105  . Heart Problems Paternal Uncle   . Angina Paternal Uncle   . Breast cancer Cousin     paternal 1st cousin d. 49  . Breast cancer Cousin     paternal 1st cousin dx. 85s   Social History   Social History  . Marital status: Single    Spouse name: n/a  . Number of children: 0  . Years of education: N/A   Occupational History  . Pharmacy Technician     [employer]   Social History Main Topics  . Smoking status: Never Smoker  . Smokeless tobacco: Never Used  . Alcohol use No  . Drug use: No  . Sexual activity: Not Asked   Other Topics Concern  . None   Social History Narrative   Lives with a boyfriend.   Depression screen [facility] 2/9 08/01/2016 06/08/2016 03/04/2016  01/27/2016 01/05/2016  Decreased Interest 0 0 0 0 0  Down, Depressed, Hopeless 0 0 0 0 0  PHQ - 2 Score 0 0 0 0 0     Review of Systems See hpi    Objective:   Physical Exam  Constitutional: She is oriented to person, place, and time. She appears well-developed and  well-nourished. No distress.  HENT:  Head: Normocephalic and atraumatic.  Right Ear: External ear normal.  Left Ear: External ear normal.  Eyes: Conjunctivae are normal. No scleral icterus.  Neck: Normal range of motion. Neck supple. No thyromegaly present.  Cardiovascular: Normal rate, regular rhythm, normal heart sounds and intact distal pulses.   Pulmonary/Chest: Effort normal and breath sounds normal. No respiratory distress.  Musculoskeletal: She exhibits no edema.  Lymphadenopathy:    She has no cervical adenopathy.  Neurological: She is alert and oriented to person, place, and time.  Skin: Skin is warm and dry. She is not diaphoretic. No erythema.  Psychiatric: She has a normal mood and affect. Her behavior is normal.         BP 107/73   Pulse 87   Temp 98.1 F (36.7 C) (Oral)   Resp 16   Ht 5\' 6"  (1.676 m)   Wt 137 lb (62.1 kg)   LMP 05/09/2014   SpO2 99%   BMI 22.11 kg/m   Assessment & Plan:   1. Ehlers-Danlos syndrome   2. Chronic pain syndrome   3. Fibromyalgia      Meds ordered this encounter  Medications  . HYDROcodone-acetaminophen (NORCO) 7.5-325 MG tablet    Sig: Take 1 tablet by mouth every 6 (six) hours as needed for moderate pain.    Dispense:  30 tablet    Refill:  0   Over 25 min spent in face-to-face evaluation of and consultation with patient and coordination of care.  Over 50% of this time was spent counseling this patient.  [name], M.D.  Primary Care at [facility] [address] [place], [place] [address] [phone] phone [phone] fax  08/10/16 11:20 PM

## 2016-08-01 NOTE — Patient Instructions (Addendum)
IF you received an x-ray today, you will receive an invoice from [facility]. Please contact [facility] at [phone] with questions or concerns regarding your invoice.   IF you received labwork today, you will receive an invoice from [place]. Please contact [facility] at [phone] with questions or concerns regarding your invoice.   Our billing staff will not be able to assist you with questions regarding bills from these companies.  You will be contacted with the lab results as soon as they are available. The fastest way to get your results is to activate your My Chart account. Instructions are located on the last page of this paperwork. If you have not heard from [place] regarding the results in 2 weeks, please contact this office.      Myofascial Pain Syndrome and Fibromyalgia Myofascial pain syndrome and fibromyalgia are both pain disorders. This pain may be felt mainly in your muscles.  Myofascial pain syndrome:  Always has trigger points or tender points in the muscle that will cause pain when pressed. The pain may come and go.  Usually affects your neck, upper back, and shoulder areas. The pain often radiates into your arms and hands.  Fibromyalgia:  Has muscle pains and tenderness that come and go.  Is often associated with fatigue and sleep disturbances.  Has trigger points.  Tends to be long-lasting (chronic), but is not life-threatening. Fibromyalgia and myofascial pain are not the same. However, they often occur together. If you have both conditions, each can make the other worse. Both are common and can cause enough pain and fatigue to make day-to-day activities difficult. What are the causes? The exact causes of fibromyalgia and myofascial pain are not known. People with certain gene types may be more likely to develop fibromyalgia. Some factors can be triggers for both conditions, such as:  Spine disorders.  Arthritis.  Severe injury  (trauma) and other physical stressors.  Being under a lot of stress.  A medical illness. What are the signs or symptoms? Fibromyalgia  The main symptom of fibromyalgia is widespread pain and tenderness in your muscles. This can vary over time. Pain is sometimes described as stabbing, shooting, or burning. You may have tingling or numbness, too. You may also have sleep problems and fatigue. You may wake up feeling tired and groggy (fibro fog). Other symptoms may include:  Bowel and bladder problems.  Headaches.  Visual problems.  Problems with odors and noises.  Depression or mood changes.  Painful menstrual periods (dysmenorrhea).  Dry skin or eyes. Myofascial pain syndrome  Symptoms of myofascial pain syndrome include:  Tight, ropy bands of muscle.  Uncomfortable sensations in muscular areas, such as:  Aching.  Cramping.  Burning.  Numbness.  Tingling.  Muscle weakness.  Trouble moving certain muscles freely (range of motion). How is this diagnosed? There are no specific tests to diagnose fibromyalgia or myofascial pain syndrome. Both can be hard to diagnose because their symptoms are common in many other conditions. Your health care provider may suspect one or both of these conditions based on your symptoms and medical history. Your health care provider will also do a physical exam. The key to diagnosing fibromyalgia is having pain, fatigue, and other symptoms for more than three months that cannot be explained by another condition. The key to diagnosing myofascial pain syndrome is finding trigger points in muscles that are tender and cause pain elsewhere in your body (referred pain). How is this treated? Treating fibromyalgia and myofascial pain  often requires a team of health care providers. This usually starts with your primary provider and a physical therapist. You may also find it helpful to work with alternative health care providers, such as massage therapists  or acupuncturists. Treatment for fibromyalgia may include medicines. This may include nonsteroidal anti-inflammatory drugs (NSAIDs), along with other medicines. Treatment for myofascial pain may also include:  NSAIDs.  Cooling and stretching of muscles.  Trigger point injections.  Sound wave (ultrasound) treatments to stimulate muscles. Follow these instructions at home:  Take medicines only as directed by your health care provider.  Exercise as directed by your health care provider or physical therapist.  Try to avoid stressful situations.  Practice relaxation techniques to control your stress. You may want to try:  Biofeedback.  Visual imagery.  Hypnosis.  Muscle relaxation.  Yoga.  Meditation.  Talk to your health care provider about alternative treatments, such as acupuncture or massage treatment.  Maintain a healthy lifestyle. This includes eating a healthy diet and getting enough sleep.  Consider joining a support group.  Do not do activities that stress or strain your muscles. That includes repetitive motions and heavy lifting. Where to find more information:  National Fibromyalgia Association: www.fmaware.[place]: www.arthritis.org  American Chronic Pain Association: OEMDeals.dk Contact a health care provider if:  You have new symptoms.  Your symptoms get worse.  You have side effects from your medicines.  You have trouble sleeping.  Your condition is causing depression or anxiety. This information is not intended to replace advice given to you by your health care provider. Make sure you discuss any questions you have with your health care provider. Document Released: 03/28/2005 Document Revised: 09/03/2015 Document Reviewed: 01/01/2014 Elsevier Interactive Patient Education  2017 [employer].

## 2016-08-10 ENCOUNTER — Encounter: Payer: Self-pay | Admitting: Family Medicine

## 2016-08-10 DIAGNOSIS — G894 Chronic pain syndrome: Secondary | ICD-10-CM | POA: Insufficient documentation

## 2016-08-10 DIAGNOSIS — M797 Fibromyalgia: Secondary | ICD-10-CM | POA: Insufficient documentation

## 2016-08-10 HISTORY — DX: Chronic pain syndrome: G89.4

## 2016-08-12 ENCOUNTER — Encounter: Payer: Self-pay | Admitting: Family Medicine

## 2016-08-17 ENCOUNTER — Telehealth: Payer: Self-pay | Admitting: Emergency Medicine

## 2016-08-31 ENCOUNTER — Other Ambulatory Visit: Payer: Self-pay

## 2016-08-31 MED ORDER — MONTELUKAST SODIUM 10 MG PO TABS: 10.0000 mg | ORAL_TABLET | Freq: Every day | ORAL | 3 refills | Status: DC

## 2016-08-31 MED ORDER — ALBUTEROL SULFATE 108 (90 BASE) MCG/ACT IN AEPB: 1.0000 | INHALATION_SPRAY | RESPIRATORY_TRACT | 1 refills | Status: DC | PRN

## 2016-08-31 NOTE — Telephone Encounter (Signed)
L/m for patient to pick up letter and rx at pharmacy

## 2016-08-31 NOTE — Telephone Encounter (Signed)
   Pt walked in upset because her my chart from 08/13/16 and f/u call 08/17/16 were not responded to. (See message below)  I dont see zovirax?    The main purpose of my last visit on 08/01/16 was to get a letter stating that I have a disability that renders me unable to work so that I may get Food Stamps and also withdraw money from my retirement without penalty. I was expecting this letter to be written at my appoinment.   You told me the letter would take 1 week to write. It is almost 2 weeks and still no letter.  Please let me know if the delay is going to be more than twice as long as was expected.    I also need the [facility]; [place], [place], [place] [address]:  1. Montelukast 10 mg QHS 90 day supply ([facility] accepts 90 day supplies of maintenance medications)  2. Albuterol Sulfate Aerosol Qty 1 PRN, (blue and gray container)  3. Zovjrax 500 mg Quantity 6 for a lip procedure. 1 pill 2 days prior to procedure, 2 pills day of procedure, and 1 pill 2 days after procedure.   Lindsey [name]  [email or web address]  [phone]

## 2016-08-31 NOTE — Telephone Encounter (Signed)
Fine to write her a letter stating: "This patient has a disability that has rendered her unable to work."  Find to refill medications as were requested

## 2016-09-15 ENCOUNTER — Ambulatory Visit (INDEPENDENT_AMBULATORY_CARE_PROVIDER_SITE_OTHER): Payer: Self-pay | Admitting: Psychiatry

## 2016-09-15 DIAGNOSIS — Z0389 Encounter for observation for other suspected diseases and conditions ruled out: Secondary | ICD-10-CM

## 2016-09-29 DIAGNOSIS — Z0271 Encounter for disability determination: Secondary | ICD-10-CM

## 2016-11-04 ENCOUNTER — Telehealth: Payer: Self-pay | Admitting: Family Medicine

## 2016-11-04 DIAGNOSIS — Q796 Ehlers-Danlos syndrome, unspecified: Secondary | ICD-10-CM

## 2016-11-04 DIAGNOSIS — L989 Disorder of the skin and subcutaneous tissue, unspecified: Secondary | ICD-10-CM

## 2016-11-04 DIAGNOSIS — M533 Sacrococcygeal disorders, not elsewhere classified: Secondary | ICD-10-CM

## 2016-11-04 DIAGNOSIS — M180 Bilateral primary osteoarthritis of first carpometacarpal joints: Secondary | ICD-10-CM

## 2016-11-04 DIAGNOSIS — L308 Other specified dermatitis: Secondary | ICD-10-CM

## 2016-11-04 DIAGNOSIS — M357 Hypermobility syndrome: Secondary | ICD-10-CM

## 2016-11-04 NOTE — Telephone Encounter (Signed)
Pt called requesting referral to Dr. Amil Amen at [facility]. She said several doctors have suggested she be seen by a rheumatologist. The fax number to their office is [phone]. Please advise. Thanks!

## 2016-11-04 NOTE — Telephone Encounter (Signed)
Referral placed per patient request

## 2016-12-20 ENCOUNTER — Ambulatory Visit (INDEPENDENT_AMBULATORY_CARE_PROVIDER_SITE_OTHER): Payer: Self-pay | Admitting: Emergency Medicine

## 2016-12-20 ENCOUNTER — Encounter: Payer: Self-pay | Admitting: Emergency Medicine

## 2016-12-20 VITALS — BP 109/69 | HR 83 | Temp 98.1°F | Resp 17 | Ht 66.0 in | Wt 142.0 lb

## 2016-12-20 DIAGNOSIS — G894 Chronic pain syndrome: Secondary | ICD-10-CM

## 2016-12-20 DIAGNOSIS — M357 Hypermobility syndrome: Secondary | ICD-10-CM

## 2016-12-20 DIAGNOSIS — T148XXD Other injury of unspecified body region, subsequent encounter: Secondary | ICD-10-CM

## 2016-12-20 MED ORDER — MUPIROCIN 2 % EX OINT: 1.0000 "application " | TOPICAL_OINTMENT | Freq: Three times a day (TID) | CUTANEOUS | 1 refills | Status: DC

## 2016-12-20 MED ORDER — TRAMADOL HCL 50 MG PO TABS: 50.0000 mg | ORAL_TABLET | Freq: Three times a day (TID) | ORAL | 1 refills | Status: DC | PRN

## 2016-12-20 MED ORDER — TRAMADOL HCL 50 MG PO TABS: ORAL_TABLET | ORAL | 0 refills | Status: DC

## 2016-12-20 NOTE — Progress Notes (Signed)
Lindsey [name] 51 y.o.   Chief Complaint  Patient presents with  . Pain  . Follow-up  . Medication Refill    bactroban,ultram    HISTORY OF PRESENT ILLNESS: This is a 51 y.o. female with multiple chronic medical issues leading to chronic pain states she needs refill of Ultram because she missed last visit? Frustrated with her care here so she's looking to transfer care. Feels her diagnosis has been missed; referred to Rheumatologist and she has appointment for next month.  HPI 08/01/16 Seen by Dr. Brigitte Pulse. The following note was reviewed by me.  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 I last saw her 5 months prior. She saw my colleague Dr. Mitchel Honour for this 2 months prior requesting refills of all of her chronic medications at that time and additionally complaining of increased baseline pain for which she was giving oxycodone 5 mg #15 but pt reports this was to strong for her.    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  Now she has been using proline Fayette Pho at [place] who also works at [employer] - they injected dextrose into joints. She was told 85% success rate in chronic conditions and made joints stronger. saraffin and saline are rec rather than dextrose which he states will eventually be absorbed (but may actually build up.).  Told pressure and instead it was sharp shooting pains and burning but told it was normal.  She has had prolotheapy and again in 5d. Did bilatreal knees arouynd Fri 3/30, the B shoulder 4/6, then B elbows. Also does neuroprolo when they use numbing (-caine) pluss a lower concentration of dextrose. Has to take Red light therapy helps some. She bought a biomat to produce infrared heat touralamine  and amythyst to help with pain - sleeps on it. $6200 and see benefit in 9-12 mos Was also told to max out vit C by Dr. Ace Gins Dr. Gust Rung rec liposomal vit C She is also doing mono and bipolar frequency of energy/light that is supposed to heat up below dermal tissue to help with pain and produce collagen (not much benefit). Also tried cryotherapy to reduce pain, inflammation, Dr. Daron Offer at [place] - did for 30 sec rather than the 3 mins rec which helped her sleep but it is contraindicated with the prolo therapy as the prolo therapy is supposed to induce a healing response though inflammation so not supposed to take any anti-inflammatories.  She got a powder alb inh instead of the aerosol -  She just needs pain medicine to get ehr through sev d after her injection.  As of 3/1 she was told that she gave her notice. So now they just said that she quit her job.  She is going to apply for Medicaid. She doesn't think she has any insurance as they sent her a letter on 4/11. She wants to apply   [facility] hurts all over - even her muscles. Cants to mammograms. PT causes more pain.  Prior to Admission medications   Medication Sig Start Date End Date Taking? Authorizing Provider  Albuterol Sulfate 108 (90 Base) MCG/ACT AEPB Inhale 1-2 puffs into the lungs every 4 (four) hours as needed.  08/31/16  Yes Shawnee Knapp, MD  CycloSPORINE (RESTASIS OP) Apply to eye.   Yes [provider]  DULoxetine (CYMBALTA) 60 MG capsule Take 1 capsule (60 mg total) by mouth daily. Increase to 2 tabs after 2 weeks 03/04/16  Yes Shawnee Knapp, MD  estradiol (VIVELLE-DOT) 0.05 MG/24HR patch  09/10/15  Yes [provider]  gabapentin (NEURONTIN) 300 MG capsule Take 1 capsule (300 mg total) by mouth at bedtime. 06/08/16  Yes Lillis Nuttle, Ines Bloomer, MD  ipratropium (ATROVENT) 0.06 % nasal spray Place 2 sprays into both nostrils every 12 (twelve) hours. 06/08/16  Yes Langford Carias, Ines Bloomer, MD  levocetirizine  (XYZAL) 5 MG tablet Take 1 tablet (5 mg total) by mouth every evening. 06/08/16  Yes Miaa Latterell, Ines Bloomer, MD  Melatonin 10 MG TABS Take by mouth.   Yes [provider]  montelukast (SINGULAIR) 10 MG tablet Take 1 tablet (10 mg total) by mouth at bedtime. 08/31/16  Yes Shawnee Knapp, MD  mupirocin ointment (BACTROBAN) 2 % Apply 1 application topically 3 (three) times daily. 12/20/16  Yes Annelie Boak, Ines Bloomer, MD  progesterone (PROMETRIUM) 100 MG capsule  09/10/15  Yes [provider]  traMADol (ULTRAM) 50 MG tablet Take 2 tabs three times a day 12/20/16  Yes Emy Angevine, Ines Bloomer, MD  traZODone (DESYREL) 50 MG tablet Take 2-3 tabs po QHS for sleep. 06/08/16  Yes Horald Pollen, MD    Allergies  Allergen Reactions  . Poison Oak Extract [Poison Oak Extract] Anaphylaxis  . Penicillins   . Sulfa Antibiotics     Patient Active Problem List   Diagnosis Date Noted  . Chronic pain syndrome 08/10/2016  . Fibromyalgia 08/10/2016  . Lumbar radiculopathy 02/16/2016  . Chronic cough 12/02/2015  . Irritable larynx syndrome 12/02/2015  . Genetic testing 09/21/2015  . Family history of breast cancer in female 08/26/2015  . Fibrocystic breast changes of both breasts 07/19/2015  . Carpal tunnel syndrome 07/07/2015  . Primary arthrosis of first carpometacarpal joints, bilateral 06/01/2015  . Ehlers-Danlos syndrome 05/11/2015  . Sacroiliac joint dysfunction of right side 11/26/2014  . Hypermobility syndrome 11/26/2014  . Essential alopecia of women 04/14/2011  . Inflammatory dermatosis 04/14/2011    Past Medical History:  Diagnosis Date  . Asthma   . Carpal tunnel syndrome 07/07/2015   right    No past surgical history on file.  Social History   Social History  . Marital status: Single    Spouse name: n/a  . Number of children: 0  . Years of education: N/A   Occupational History  . Pharmacy Technician     [employer]   Social History Main Topics  .  Smoking status: Never Smoker  . Smokeless tobacco: Never Used  . Alcohol use No  . Drug use: No  . Sexual activity: Not on file   Other Topics Concern  . Not on file   Social History Narrative  . No narrative on file    Family History  Problem Relation Age of Onset  . Hypertension Mother   . Other Mother        hx of hysterectomy at 42-56 for prolapsed uterus; hx of benign L arm cyst in her 65s  . Stroke Father   . Hypertension Father   . Skin cancer Father 43       NOS  . Other Father        enlarged prostate s/p surgery  . Alzheimer's disease Maternal Aunt   .  Stomach cancer Paternal Aunt        d. 68; was a Manufacturing engineer and did not go to the doctor, so not sure age of onset  . Congestive Heart Failure Maternal Grandmother   . Prostate cancer Maternal Grandfather        dx. 75-76  . Heart Problems Paternal Grandmother   . Heart Problems Paternal Grandfather   . Colon cancer Other        maternal great aunt (MGM's sister) dx in her 43s  . Heart attack Paternal Uncle 78  . Heart Problems Paternal Uncle   . Angina Paternal Uncle   . Breast cancer Cousin        paternal 1st cousin d. 71  . Breast cancer Cousin        paternal 1st cousin dx. 50s     Review of Systems  Constitutional: Negative for chills and fever.  Respiratory: Negative for cough and shortness of breath.   Cardiovascular: Negative for chest pain and palpitations.  Gastrointestinal: Negative for abdominal pain, nausea and vomiting.  Musculoskeletal: Positive for back pain, joint pain and myalgias.  Skin: Negative for rash.  Neurological: Negative for focal weakness, loss of consciousness and headaches.   Vitals:   12/20/16 0946  BP: 109/69  Pulse: 83  Resp: 17  Temp: 98.1 F (36.7 C)  SpO2: 98%     Physical Exam  Constitutional: She is oriented to person, place, and time. She appears well-developed and well-nourished.  HENT:  Head: Normocephalic.  Eyes: Pupils are equal, round,  and reactive to light.  Neck: Normal range of motion.  Cardiovascular: Normal rate.   Pulmonary/Chest: Effort normal.  Musculoskeletal: Normal range of motion.  Neurological: She is alert and oriented to person, place, and time.  Psychiatric: She has a normal mood and affect. Her behavior is normal.  Vitals reviewed.    ASSESSMENT & PLAN:  Paytyn was seen today for pain, follow-up and medication refill.  Diagnoses and all orders for this visit:  Chronic pain syndrome -     Discontinue: traMADol (ULTRAM) 50 MG tablet; Take 1 tablet (50 mg total) by mouth every 8 (eight) hours as needed.  Hypermobility syndrome  Delayed wound healing -     Discontinue: mupirocin ointment (BACTROBAN) 2 %; Apply 1 application topically 3 (three) times daily. -     mupirocin ointment (BACTROBAN) 2 %; Apply 1 application topically 3 (three) times daily.  Other orders -     traMADol (ULTRAM) 50 MG tablet; Take 2 tabs three times a day     Agustina Caroli, MD [facility]

## 2016-12-20 NOTE — Patient Instructions (Signed)
     IF you received an x-ray today, you will receive an invoice from [facility]. Please contact  Radiology at [phone] with questions or concerns regarding your invoice.   IF you received labwork today, you will receive an invoice from [facility]. Please contact [facility] at [phone] with questions or concerns regarding your invoice.   Our billing staff will not be able to assist you with questions regarding bills from these companies.  You will be contacted with the lab results as soon as they are available. The fastest way to get your results is to activate your My Chart account. Instructions are located on the last page of this paperwork. If you have not heard from us regarding the results in 2 weeks, please contact this office.     

## 2017-01-14 ENCOUNTER — Other Ambulatory Visit: Payer: Self-pay | Admitting: Emergency Medicine

## 2017-01-17 NOTE — Telephone Encounter (Signed)
Please send this message to her PCP, Dr. Brigitte Pulse.

## 2017-01-20 ENCOUNTER — Telehealth: Payer: Self-pay | Admitting: Family Medicine

## 2017-01-20 MED ORDER — TRAMADOL HCL 50 MG PO TABS: ORAL_TABLET | ORAL | 0 refills | Status: DC

## 2017-01-20 NOTE — Telephone Encounter (Signed)
Pt is highly anxious to get this refill on her tramadol she has requested this on 01/14/17 and she states she neds this fill TODAY!!  Best number [phone]

## 2017-01-20 NOTE — Telephone Encounter (Signed)
Please advise 

## 2017-01-20 NOTE — Telephone Encounter (Signed)
Patient needs an appointment for refill of controlled substance and I have not seen pt in 6 months.  Will make a one time exception to give her time to get an appointment. Med ready for pick-up - put in Lindsey [name]'s box at [facility].

## 2017-01-20 NOTE — Telephone Encounter (Signed)
Send to Dr. Brigitte Pulse, please.

## 2017-01-21 ENCOUNTER — Other Ambulatory Visit: Payer: Self-pay | Admitting: Family Medicine

## 2017-01-21 MED ORDER — DULOXETINE HCL 60 MG PO CPEP: 60.0000 mg | ORAL_CAPSULE | Freq: Every day | ORAL | 0 refills | Status: DC

## 2017-01-23 NOTE — Telephone Encounter (Signed)
Called pt and informed her she could pick up Rx at the [address].

## 2017-01-24 ENCOUNTER — Telehealth: Payer: Self-pay

## 2017-01-24 NOTE — Telephone Encounter (Signed)
Called patient to review health maintenance, however, there was no answer.  LMVM to CB.

## 2017-01-31 ENCOUNTER — Other Ambulatory Visit: Payer: Self-pay | Admitting: *Deleted

## 2017-01-31 NOTE — Patient Outreach (Signed)
Attempted to email patient about the need to schedule a  Link To Wellness follow up appointment for hyperlipidemia assistance. She is no longer employed at [employer] and also no longer carries [employer] so she does not qualify for ongoing participation in the Link To Wellness program. Will close case effective today. Barrington Ellison RN,CCM,CDE [employer] Management Coordinator Link To Wellness and [employer] [phone] Office Fax [phone]

## 2017-02-24 ENCOUNTER — Telehealth: Payer: Self-pay | Admitting: Family Medicine

## 2017-03-13 ENCOUNTER — Telehealth: Payer: Self-pay | Admitting: *Deleted

## 2017-03-13 NOTE — Telephone Encounter (Signed)
Pt called back to get refill on tramadol   .  Pt states Rheumatologist Dr Kathlene November  Told her her pcp should be refilling the med and he is sending over notes today.  [facility]

## 2017-03-13 NOTE — Telephone Encounter (Signed)
Last refill in October stated Per Dr. Brigitte Pulse she will need an appointment for any more refills of Tramadol.

## 2017-03-13 NOTE — Telephone Encounter (Signed)
Pt is completely out

## 2017-03-13 NOTE — Telephone Encounter (Signed)
Patient will need an appointment for any more refills of Tramadol.

## 2017-04-01 ENCOUNTER — Encounter: Payer: Self-pay | Admitting: Family Medicine

## 2017-04-01 DIAGNOSIS — M1990 Unspecified osteoarthritis, unspecified site: Secondary | ICD-10-CM | POA: Insufficient documentation

## 2017-04-01 HISTORY — DX: Unspecified osteoarthritis, unspecified site: M19.90

## 2017-04-01 NOTE — Telephone Encounter (Signed)
error 

## 2017-04-11 DIAGNOSIS — J342 Deviated nasal septum: Secondary | ICD-10-CM | POA: Insufficient documentation

## 2017-05-29 DIAGNOSIS — R232 Flushing: Secondary | ICD-10-CM | POA: Diagnosis not present

## 2017-05-30 ENCOUNTER — Other Ambulatory Visit: Payer: Self-pay | Admitting: Family Medicine

## 2017-05-30 NOTE — Telephone Encounter (Signed)
Copied from [place] [id number]. Topic: Quick Communication - Rx Refill/Question >> May 30, 2017  2:16 PM Margot Ables wrote: Medication: hyoscyamine (OSCIMIN SR) 0.375 MG 12 hr tablet  - pt requested med to be transferred from [place] - no refills left and expired RX - please advise Preferred Pharmacy (with phone number or street name): [id number] [place] [place]. AT [address] [phone] (Phone) [phone] (Fax)

## 2017-05-30 NOTE — Telephone Encounter (Signed)
LVM for pt to call office back about refills and make an appointment.

## 2017-05-31 MED ORDER — HYOSCYAMINE SULFATE ER 0.375 MG PO TB12: 0.3750 mg | ORAL_TABLET | Freq: Two times a day (BID) | ORAL | 0 refills | Status: DC

## 2017-05-31 MED ORDER — LEVOCETIRIZINE DIHYDROCHLORIDE 5 MG PO TABS: 5.0000 mg | ORAL_TABLET | Freq: Every evening | ORAL | 2 refills | Status: DC

## 2017-06-02 DIAGNOSIS — R232 Flushing: Secondary | ICD-10-CM | POA: Diagnosis not present

## 2017-06-02 DIAGNOSIS — R5383 Other fatigue: Secondary | ICD-10-CM | POA: Diagnosis not present

## 2017-06-02 DIAGNOSIS — G479 Sleep disorder, unspecified: Secondary | ICD-10-CM | POA: Diagnosis not present

## 2017-06-10 DIAGNOSIS — Z23 Encounter for immunization: Secondary | ICD-10-CM | POA: Diagnosis not present

## 2017-06-22 DIAGNOSIS — G5603 Carpal tunnel syndrome, bilateral upper limbs: Secondary | ICD-10-CM | POA: Diagnosis not present

## 2017-06-30 DIAGNOSIS — G479 Sleep disorder, unspecified: Secondary | ICD-10-CM | POA: Diagnosis not present

## 2017-06-30 DIAGNOSIS — R5383 Other fatigue: Secondary | ICD-10-CM | POA: Diagnosis not present

## 2017-06-30 DIAGNOSIS — R232 Flushing: Secondary | ICD-10-CM | POA: Diagnosis not present

## 2017-07-07 DIAGNOSIS — R232 Flushing: Secondary | ICD-10-CM | POA: Diagnosis not present

## 2017-07-07 DIAGNOSIS — R5383 Other fatigue: Secondary | ICD-10-CM | POA: Diagnosis not present

## 2017-07-07 DIAGNOSIS — N951 Menopausal and female climacteric states: Secondary | ICD-10-CM | POA: Diagnosis not present

## 2017-07-07 DIAGNOSIS — M2550 Pain in unspecified joint: Secondary | ICD-10-CM | POA: Diagnosis not present

## 2017-07-14 DIAGNOSIS — J309 Allergic rhinitis, unspecified: Secondary | ICD-10-CM | POA: Diagnosis not present

## 2017-07-19 DIAGNOSIS — J45909 Unspecified asthma, uncomplicated: Secondary | ICD-10-CM | POA: Diagnosis not present

## 2017-07-20 ENCOUNTER — Ambulatory Visit: Payer: BLUE CROSS/BLUE SHIELD | Admitting: Podiatry

## 2017-07-20 ENCOUNTER — Ambulatory Visit (INDEPENDENT_AMBULATORY_CARE_PROVIDER_SITE_OTHER): Payer: BLUE CROSS/BLUE SHIELD

## 2017-07-20 ENCOUNTER — Encounter: Payer: Self-pay | Admitting: Podiatry

## 2017-07-20 DIAGNOSIS — N906 Unspecified hypertrophy of vulva: Secondary | ICD-10-CM

## 2017-07-20 DIAGNOSIS — M2012 Hallux valgus (acquired), left foot: Secondary | ICD-10-CM

## 2017-07-20 DIAGNOSIS — M2011 Hallux valgus (acquired), right foot: Secondary | ICD-10-CM

## 2017-07-20 HISTORY — DX: Unspecified hypertrophy of vulva: N90.60

## 2017-07-20 NOTE — Progress Notes (Signed)
She presents today for surgical consult regarding her painful bunion left.  She states that she history of Ehlers-Danlos syndrome but otherwise she is doing pretty good.  She denies any major cardiac disease.  She states that her foot is becoming more uncomfortable as the days go by not allowing her to perform her daily activities free of pain.  She states that is a heavy her ability to do her daily activities and perform her jobs.  Objective: Vital signs are stable she is alert and oriented x3.  Pulses are palpable.  She has a flexible first metatarsophalangeal joint with hallux abductovalgus deformity which is painful on palpation and range of motion.  Radiographs were read taken today and reevaluated demonstrate an increase in the first intermetatarsal angle greater than normal value and hallux abductus angle greater than normal value with dislocation of the first metatarsal phalangeal joint laterally.  Assessment: Painful first metatarsal phalangeal joint secondary to hallux abductovalgus deformity left.  Plan: Discussed etiology pathology conservative versus surgical therapies.  At this point we discussed a long arm Austin bunion repair with 2 screws she understands this and is amenable to it.  We did discuss the possible postop complications which may include but are not limited to postop pain bleeding swelling infection recurrence need further surgery overcorrection under correction loss of digit loss of limb loss of life.  We dispensed both oral and written home-going instructions for her preop instructions as well as information regarding the surgery center and the anesthesia group.  I answered all the questions regarding this procedure to the best of my ability in layman's terms she understood and was amenable to it signed all 3 page of the consent form and I will follow-up with her in the near page future for surgical intervention.

## 2017-07-20 NOTE — Patient Instructions (Signed)
Pre-Operative Instructions  Congratulations, you have decided to take an important step towards improving your quality of life.  You can be assured that the doctors and staff at [facility] will be with you every step of the way.  Here are some important things you should know:  1. Plan to be at the surgery center/hospital at least 1 (one) hour prior to your scheduled time, unless otherwise directed by the surgical center/hospital staff.  You must have a responsible adult accompany you, remain during the surgery and drive you home.  Make sure you have directions to the surgical center/hospital to ensure you arrive on time. 2. If you are having surgery at [facility] or [facility] hospitals, you will need a copy of your medical history and physical form from your family physician within one month prior to the date of surgery. We will give you a form for your primary physician to complete.  3. We make every effort to accommodate the date you request for surgery.  However, there are times where surgery dates or times have to be moved.  We will contact you as soon as possible if a change in schedule is required.   4. No aspirin/ibuprofen for one week before surgery.  If you are on aspirin, any non-steroidal anti-inflammatory medications (Mobic, Aleve, Ibuprofen) should not be taken seven (7) days prior to your surgery.  You make take Tylenol for pain prior to surgery.  5. Medications - If you are taking daily heart and blood pressure medications, seizure, reflux, allergy, asthma, anxiety, pain or diabetes medications, make sure you notify the surgery center/hospital before the day of surgery so they can tell you which medications you should take or avoid the day of surgery. 6. No food or drink after midnight the night before surgery unless directed otherwise by surgical center/hospital staff. 7. No alcoholic beverages 24-hours prior to surgery.  No smoking 24-hours prior or 24-hours after  surgery. 8. Wear loose pants or shorts. They should be loose enough to fit over bandages, boots, and casts. 9. Don't wear slip-on shoes. Sneakers are preferred. 10. Bring your boot with you to the surgery center/hospital.  Also bring crutches or a walker if your physician has prescribed it for you.  If you do not have this equipment, it will be provided for you after surgery. 11. If you have not been contacted by the surgery center/hospital by the day before your surgery, call to confirm the date and time of your surgery. 12. Leave-time from work may vary depending on the type of surgery you have.  Appropriate arrangements should be made prior to surgery with your employer. 13. Prescriptions will be provided immediately following surgery by your doctor.  Fill these as soon as possible after surgery and take the medication as directed. Pain medications will not be refilled on weekends and must be approved by the doctor. 14. Remove nail polish on the operative foot and avoid getting pedicures prior to surgery. 15. Wash the night before surgery.  The night before surgery wash the foot and leg well with water and the antibacterial soap provided. Be sure to pay special attention to beneath the toenails and in between the toes.  Wash for at least three (3) minutes. Rinse thoroughly with water and dry well with a towel.  Perform this wash unless told not to do so by your physician.  Enclosed: 1 Ice pack (please put in freezer the night before surgery)   1 Hibiclens skin cleaner     Pre-op instructions  If you have any questions regarding the instructions, please do not hesitate to call our office.  [place]: [address], , [place] [address] -- [phone]  [place]: [address] [place], [place] [address] -- [phone]  [place]: [address]  [place], [place] [address] -- [phone]  [place]: [address], [address], [place], [place] [address] -- [phone]  Website:  [email or web address] 

## 2017-07-21 ENCOUNTER — Other Ambulatory Visit: Payer: Self-pay

## 2017-07-21 ENCOUNTER — Ambulatory Visit: Payer: BLUE CROSS/BLUE SHIELD | Admitting: Physician Assistant

## 2017-07-21 ENCOUNTER — Encounter: Payer: Self-pay | Admitting: Physician Assistant

## 2017-07-21 VITALS — BP 108/67 | HR 40 | Temp 98.3°F | Resp 16 | Ht 66.0 in | Wt 135.2 lb

## 2017-07-21 DIAGNOSIS — H9203 Otalgia, bilateral: Secondary | ICD-10-CM | POA: Diagnosis not present

## 2017-07-21 MED ORDER — HYDROCORTISONE-ACETIC ACID 1-2 % OT SOLN: 3.0000 [drp] | Freq: Three times a day (TID) | OTIC | 0 refills | Status: DC

## 2017-07-21 NOTE — Progress Notes (Signed)
Lindsey [name]  MRN: [id number] DOB: 06/10/1965  PCP: Shawnee Knapp, MD  Subjective:  Pt is a 52 year old female who presents to clinic for ear pain x 3 weeks.  Painful to touch, pain is described as "sharp and shooting", "ears pop when I swallow", drainage from both ears 3 weeks ago, pain radiates to jaw. "I have had fluid behind my ears since January". Plans to see allergist She has been seen twice for this problem - Keflex and afrin are not working. Ibuprofen and tylenol are not working. She is taking several allergy medications.  She has been out of work x 3 days due to pain. She plans to see ENT soon for this problem.  Denies dizziness, ringing of her ears, HA, balance problems, rash      Review of Systems  Constitutional: Positive for fatigue. Negative for chills, diaphoresis and fever.  HENT: Positive for congestion, ear discharge, ear pain (b/l), postnasal drip, rhinorrhea, sinus pressure, sinus pain and sore throat.   Respiratory: Negative for cough, chest tightness, shortness of breath and wheezing.     Patient Active Problem List   Diagnosis Date Noted  . Hypertrophy of vulva 07/20/2017  . Osteoarthritis 04/01/2017  . Chronic pain syndrome 08/10/2016  . Fibromyalgia 08/10/2016  . Lumbar radiculopathy 02/16/2016  . Chronic cough 12/02/2015  . Irritable larynx syndrome 12/02/2015  . Genetic testing 09/21/2015  . Family history of breast cancer in female 08/26/2015  . Fibrocystic breast changes of both breasts 07/19/2015  . Carpal tunnel syndrome 07/07/2015  . Primary arthrosis of first carpometacarpal joints, bilateral 06/01/2015  . Ehlers-Danlos syndrome 05/11/2015  . Sacroiliac joint dysfunction of right side 11/26/2014  . Hypermobility syndrome 11/26/2014  . Essential alopecia of women 04/14/2011  . Inflammatory dermatosis 04/14/2011    Current Outpatient Medications on File Prior to Visit  Medication Sig Dispense Refill  . estradiol (VIVELLE-DOT) 0.05 MG/24HR  patch   11  . gabapentin (NEURONTIN) 300 MG capsule Take 1 capsule (300 mg total) by mouth at bedtime. 90 capsule 3  . hyoscyamine (LEVBID) 0.375 MG 12 hr tablet Take 1 tablet (0.375 mg total) by mouth 2 (two) times daily. 180 tablet 0  . ipratropium (ATROVENT) 0.06 % nasal spray Place 2 sprays into both nostrils every 12 (twelve) hours. 15 mL 3  . levocetirizine (XYZAL) 5 MG tablet Take 1 tablet (5 mg total) by mouth every evening. 90 tablet 2  . Melatonin 10 MG TABS Take by mouth.    . montelukast (SINGULAIR) 10 MG tablet Take 1 tablet (10 mg total) by mouth at bedtime. 90 tablet 3  . mupirocin ointment (BACTROBAN) 2 % Apply 1 application topically 3 (three) times daily. 22 g 1  . progesterone (PROMETRIUM) 100 MG capsule   11  . traZODone (DESYREL) 50 MG tablet Take 2-3 tabs po QHS for sleep. 270 tablet 1  . Albuterol Sulfate 108 (90 Base) MCG/ACT AEPB Inhale 1-2 puffs into the lungs every 4 (four) hours as needed. (Patient not taking: Reported on 07/21/2017) 3 each 1   No current facility-administered medications on file prior to visit.     Allergies  Allergen Reactions  . Poison Oak Extract [Poison Oak Extract] Anaphylaxis  . Penicillins   . Sulfa Antibiotics   . Sulfamethoxazole-Trimethoprim      Objective:  BP 108/67   Pulse (!) 40   Temp 98.3 F (36.8 C) (Oral)   Resp 16   Ht 5\' 6"  (1.676 m)  Wt 135 lb 3.2 oz (61.3 kg)   LMP 05/09/2014   SpO2 96%   BMI 21.82 kg/m   Physical Exam  Constitutional: She is oriented to person, place, and time. No distress.  HENT:  Right Ear: Hearing, tympanic membrane, external ear and ear canal normal. No drainage. No mastoid tenderness. Tympanic membrane is not perforated. No middle ear effusion.  Left Ear: Hearing, tympanic membrane, external ear and ear canal normal. No drainage. No mastoid tenderness. Tympanic membrane is not perforated.  No middle ear effusion.  Mouth/Throat: Uvula is midline, oropharynx is clear and moist and mucous  membranes are normal.  Cardiovascular: Normal rate, regular rhythm and normal heart sounds.  Neurological: She is alert and oriented to person, place, and time.  Skin: Skin is warm and dry.  Psychiatric: Judgment normal.  Vitals reviewed.   Assessment and Plan :  1. Otalgia of both ears - acetic acid-hydrocortisone (VOSOL-HC) OTIC solution; Place 3-5 drops into both ears 3 (three) times daily.  Dispense: 10 mL; Refill: 0 - Pt presents with b/l ear pain x 3 weeks. She has been seen twice at other clinics for this problem and treated with Afrin and Keflex - which are not helping. No concerning findings on PE today. Plan to treat with Vosol and advised her to f/u with her ENT for this problem if no improvement 5-7 days. She understands and agrees with plan.   Mercer Pod, PA-C  [facility] 07/21/2017 12:01 PM

## 2017-07-21 NOTE — Patient Instructions (Addendum)
Acetic Acid; Hydrocortisone Ear Solution What is this medicine? ACETIC ACID; HYDROCORTISONE (a SEE tik AS id; hye droe KOR ti sone) is used to treat outer ear infections. This medicine may be used for other purposes; ask your health care provider or pharmacist if you have questions. COMMON BRAND NAME(S): Acetasol HC, Otomycet-HC, VoSoL HC What should I tell my health care provider before I take this medicine? They need to know if you have any of these conditions: -herpes infection -ruptured ear drum -vaccinia (the skin blister that occurs after a smallpox vaccine) -an unusual or allergic reaction to acetic acid, hydrocortisone, propylene glycol, other medicines, foods, dyes, or preservatives -pregnant or trying to get pregnant -breast-feeding How should I use this medicine? This medicine is only for use in your ears. Follow the directions on the prescription label. Wash your hands with soap and water. First, carefully clean your ear(s) with a dry cotton swab. Gently warm the bottle by holding it in the hand for 1 to 2 minutes. Use the ear solution as directed by your doctor or health care professional. Shanda Howells down on your side with the affected ear up. Try not to touch the tip of the dropper to your ear, fingertips, or other surface. Squeeze the bottle gently to put the prescribed number of drops in the ear canal. Stay in this position for 30 to 60 seconds to help the drops soak into the ear. Repeat, if necessary, for the opposite ear. Do not use your medicine more often than directed. Finish the full course of medicine prescribed by your health care professional even if you think your condition is better. Talk to your pediatrician regarding the use of this medicine in children. While this drug may be prescribed for children as young as 13 years of age and older for selected conditions, precautions do apply. Overdosage: If you think you have taken too much of this medicine contact a poison control  center or emergency room at once. NOTE: This medicine is only for you. Do not share this medicine with others. What if I miss a dose? If you miss a dose, use it as soon as you can. If it is almost time for your next dose, use only that dose. Do not use double or extra doses. What may interact with this medicine? Interactions are not expected. Do not use other ear products without talking to your doctor or health care professional. This list may not describe all possible interactions. Give your health care provider a list of all the medicines, herbs, non-prescription drugs, or dietary supplements you use. Also tell them if you smoke, drink alcohol, or use illegal drugs. Some items may interact with your medicine. What should I watch for while using this medicine? Tell your doctor or health care professional if your ear infection does not get better in a few days. If a rash or allergic reaction occurs, stop using this product right away and contact your doctor or health care professional. It is important that you keep the infected ear(s) clean and dry. When bathing, try not to get the infected ear(s) wet. Do not go swimming unless your doctor or health care professional has told you otherwise. To prevent the spread of infection, do not share ear products or share towels and washcloths with anyone else. What side effects may I notice from receiving this medicine? Side effects that you should report to your doctor or health care professional as soon as possible: -allergic reactions like skin rash, itching or  hives, swelling of the face, lips, or tongue -burning and redness -worsening ear pain Side effects that usually do not require medical attention (report to your doctor or health care professional if they continue or are bothersome): -unpleasant feeling while putting the drops in the ear This list may not describe all possible side effects. Call your doctor for medical advice about side effects. You  may report side effects to FDA at 1-800-FDA-1088. Where should I keep my medicine? Keep out of the reach of children. Store at room temperature between 15 and 30 degrees C (59 and 86 degrees F). Do not freeze. Protect from light. Throw away any unused medicine after the expiration date. NOTE: This sheet is a summary. It may not cover all possible information. If you have questions about this medicine, talk to your doctor, pharmacist, or health care provider.  2018 Elsevier/Gold Standard (2007-06-21 10:56:41)  IF you received an x-ray today, you will receive an invoice from [facility]. Please contact [facility] at [phone] with questions or concerns regarding your invoice.   IF you received labwork today, you will receive an invoice from [facility]. Please contact [facility] at [phone] with questions or concerns regarding your invoice.   Our billing staff will not be able to assist you with questions regarding bills from these companies.  You will be contacted with the lab results as soon as they are available. The fastest way to get your results is to activate your My Chart account. Instructions are located on the last page of this paperwork. If you have not heard from [place] regarding the results in 2 weeks, please contact this office.

## 2017-07-25 DIAGNOSIS — H6983 Other specified disorders of Eustachian tube, bilateral: Secondary | ICD-10-CM | POA: Diagnosis not present

## 2017-07-25 DIAGNOSIS — Z6821 Body mass index (BMI) 21.0-21.9, adult: Secondary | ICD-10-CM | POA: Diagnosis not present

## 2017-07-25 DIAGNOSIS — J310 Chronic rhinitis: Secondary | ICD-10-CM | POA: Diagnosis not present

## 2017-07-25 DIAGNOSIS — Z01419 Encounter for gynecological examination (general) (routine) without abnormal findings: Secondary | ICD-10-CM | POA: Diagnosis not present

## 2017-07-25 DIAGNOSIS — Z1231 Encounter for screening mammogram for malignant neoplasm of breast: Secondary | ICD-10-CM | POA: Diagnosis not present

## 2017-07-25 DIAGNOSIS — J342 Deviated nasal septum: Secondary | ICD-10-CM | POA: Diagnosis not present

## 2017-07-25 DIAGNOSIS — J343 Hypertrophy of nasal turbinates: Secondary | ICD-10-CM | POA: Diagnosis not present

## 2017-07-31 ENCOUNTER — Other Ambulatory Visit: Payer: Self-pay | Admitting: Obstetrics and Gynecology

## 2017-07-31 DIAGNOSIS — Z803 Family history of malignant neoplasm of breast: Secondary | ICD-10-CM

## 2017-08-10 ENCOUNTER — Other Ambulatory Visit: Payer: Self-pay

## 2017-08-13 DIAGNOSIS — Z23 Encounter for immunization: Secondary | ICD-10-CM | POA: Diagnosis not present

## 2017-08-14 ENCOUNTER — Telehealth: Payer: Self-pay | Admitting: *Deleted

## 2017-08-14 NOTE — Telephone Encounter (Signed)
"  I'm supposed to have surgery with Dr. Milinda Pointer on Friday, May 31 and I just wanted to speak to you about doing Mardelle Matte as well.  I didn't know if we needed to schedule more time for that or how that would work.  I just started to have more pain recently and into the toes as well.  If you could give me a call back and let me know if we can get that added to the surgery schedule, I would appreciate it.  If you get my voicemail leave me a message letting me know if we can add to my surgery and what other things need to be done."

## 2017-08-15 DIAGNOSIS — M79643 Pain in unspecified hand: Secondary | ICD-10-CM | POA: Diagnosis not present

## 2017-08-15 DIAGNOSIS — M549 Dorsalgia, unspecified: Secondary | ICD-10-CM | POA: Diagnosis not present

## 2017-08-15 DIAGNOSIS — M2550 Pain in unspecified joint: Secondary | ICD-10-CM | POA: Diagnosis not present

## 2017-08-15 DIAGNOSIS — M1990 Unspecified osteoarthritis, unspecified site: Secondary | ICD-10-CM | POA: Diagnosis not present

## 2017-08-15 DIAGNOSIS — M797 Fibromyalgia: Secondary | ICD-10-CM | POA: Diagnosis not present

## 2017-08-17 NOTE — Telephone Encounter (Signed)
I left the patient a message that she will have to schedule another consultation appointment with Dr. Milinda Pointer in order to add any procedures to the case.  I asked her to call and schedule an appointment with Dr. Milinda Pointer.

## 2017-08-23 ENCOUNTER — Ambulatory Visit
Admission: RE | Admit: 2017-08-23 | Discharge: 2017-08-23 | Disposition: A | Discharge status: Home / Self Care | Payer: Medicaid Other | Source: Ambulatory Visit | Attending: Obstetrics and Gynecology | Admitting: Obstetrics and Gynecology

## 2017-08-23 DIAGNOSIS — Z803 Family history of malignant neoplasm of breast: Secondary | ICD-10-CM

## 2017-08-23 MED ORDER — GADOBENATE DIMEGLUMINE 529 MG/ML IV SOLN
13.0000 mL | Freq: Once | INTRAVENOUS | Status: AC | PRN
  Administered 2017-08-23: 13 mL via INTRAVENOUS

## 2017-08-27 ENCOUNTER — Telehealth: Payer: Self-pay | Admitting: Emergency Medicine

## 2017-08-28 NOTE — Telephone Encounter (Signed)
Called pt and left message to call pharmacy she was meds to go to, to transfer meds to local pharmacy

## 2017-08-29 NOTE — Telephone Encounter (Signed)
Patient called, left VM to return call back to the office to discuss the refill request of gabapentin. It was last sent to mail order and now being requested by local pharmacy. Needing clarification on which pharmacy to send the refill to. [facility] and spoke to Clair Gulling, [facility] who says the patient had this medication filled there on 05/30/17 90 day supply with 0 refills. I advised this request will be sent to the provider.  Gabapentin refill Last OV:12/20/16 Last refill:05/30/17 90 day/[facility] refills [facility]) VVO:HYWV [id number] [place], [place]. AT [address] [place] [phone] (Phone) [phone] (Fax)

## 2017-09-06 ENCOUNTER — Telehealth: Payer: Self-pay | Admitting: *Deleted

## 2017-09-06 ENCOUNTER — Other Ambulatory Visit: Payer: Self-pay | Admitting: Podiatry

## 2017-09-06 MED ORDER — ONDANSETRON HCL 4 MG PO TABS: 4.0000 mg | ORAL_TABLET | Freq: Three times a day (TID) | ORAL | 0 refills | Status: DC | PRN

## 2017-09-06 MED ORDER — OXYCODONE-ACETAMINOPHEN 10-325 MG PO TABS: 1.0000 | ORAL_TABLET | Freq: Four times a day (QID) | ORAL | 0 refills | Status: DC | PRN

## 2017-09-06 MED ORDER — CLINDAMYCIN HCL 150 MG PO CAPS: 150.0000 mg | ORAL_CAPSULE | Freq: Three times a day (TID) | ORAL | 0 refills | Status: DC

## 2017-09-06 NOTE — Telephone Encounter (Signed)
"  I need to cancel my surgery for this Friday.  I will call back to reschedule it.  I want to have two [place] added to my procedure.  I was told I would have to come back and see Dr. Milinda Pointer to get him to add these toes."  Yes, that is correct, you will need to see him for another consultation.  "I will call back to schedule another appointment to see him.  I'll reschedule the surgery after I see him again."

## 2017-09-07 ENCOUNTER — Ambulatory Visit: Payer: BLUE CROSS/BLUE SHIELD | Admitting: Allergy

## 2017-09-07 ENCOUNTER — Encounter: Payer: Self-pay | Admitting: Allergy

## 2017-09-07 ENCOUNTER — Other Ambulatory Visit: Payer: Self-pay | Admitting: Allergy

## 2017-09-07 VITALS — BP 110/70 | HR 68 | Temp 98.1°F | Resp 17 | Ht 66.5 in | Wt 138.6 lb

## 2017-09-07 DIAGNOSIS — J4540 Moderate persistent asthma, uncomplicated: Secondary | ICD-10-CM | POA: Diagnosis not present

## 2017-09-07 DIAGNOSIS — Z91018 Allergy to other foods: Secondary | ICD-10-CM | POA: Diagnosis not present

## 2017-09-07 DIAGNOSIS — J309 Allergic rhinitis, unspecified: Secondary | ICD-10-CM

## 2017-09-07 DIAGNOSIS — H1010 Acute atopic conjunctivitis, unspecified eye: Secondary | ICD-10-CM | POA: Diagnosis not present

## 2017-09-07 MED ORDER — AZELASTINE HCL 0.1 % NA SOLN: 2.0000 | Freq: Two times a day (BID) | NASAL | 5 refills | Status: DC

## 2017-09-07 MED ORDER — BEPOTASTINE BESILATE 1.5 % OP SOLN: 1.0000 [drp] | Freq: Every day | OPHTHALMIC | 5 refills | Status: DC | PRN

## 2017-09-07 MED ORDER — AZELASTINE HCL 0.05 % OP SOLN: 1.0000 [drp] | Freq: Two times a day (BID) | OPHTHALMIC | 5 refills | Status: DC

## 2017-09-07 MED ORDER — EPINEPHRINE 0.3 MG/0.3ML IJ SOAJ: 0.3000 mg | Freq: Once | INTRAMUSCULAR | 2 refills | Status: AC

## 2017-09-07 NOTE — Patient Instructions (Addendum)
Rhinoconjunctivitis - environmental allergy skin testing is positive to weeds (burweed marshelder, sheep sorrell), trees ( ash, red cedar, cottonwood) - trial Ryvent 5mg  - daily - continue singulair 10mg  daily - take at bedtime - for nasal drainage use Astelin 2 sprays each nostril twice a day - saline rinse twice a day and use prior to nasal spray use - for itchy/watery/red eyes use either Bepreve or Optivar use 1 drop each eye daily as needed - allergen immunotherapy discussed today including protocol, benefits and risk.  Informational handout provided.  If interested in this therapuetic option you can check with your insurance carrier for coverage.  Let us know if you would like to proceed with this option.   Asthma  - at this time not well controlled  - have access to albuterol inhaler 2 puffs every 4-6 hours as needed for cough/wheeze/shortness of breath/chest tightness.  May use 15-20 minutes prior to activity.   Monitor frequency of use.    - continue singulair 10mg  daily   - Qvar 90mcg redihaler 2 inhalations twice a day  Asthma control goals:   Full participation in all desired activities (may need albuterol before activity)  Albuterol use two time or less a week on average (not counting use with activity)  Cough interfering with sleep two time or less a month  Oral steroids no more than once a year  No hospitalizations  Food allergy    - continue avoidance of kiwi    - have access to self-injectable epinephrine (Epipen or AuviQ) 0.3mg  at all times    - follow emergency action plan in case of allergic reaction  Follow-up 3-4 months or sooner if needed

## 2017-09-07 NOTE — Progress Notes (Signed)
New Patient Note  RE: Lindsey [name] MRN: [id number] DOB: March 24, 1966 Date of Office Visit: 09/07/2017  Referring provider: Leighton Ruff, MD Primary care provider: Shawnee Knapp, MD  Chief Complaint: asthma and allergies  History of present illness: Lindsey [name] is a 52 y.o. female presenting today for consultation for asthma and allergic rhinitis.   When she was a child she recalls having allergy testing done and being allergic to ragweed and grass.  As an adult she has had repeat testing done and was told she was not allergic to anything.  However she states she remains symptomatic.  Symptoms include runny and stuffy nose, itchy/watery eyes, sneezing, ear pressure and PND with cough.  She also feels her asthma symptoms are worse when allergy symptoms are worse.  Symptoms are year-round and worse in the spring and fall.  Her current medication regimen includes nasal atrovent, xyzal and allegra (one in the AM and other in PM), singulair.  She states in the past she has used flonase and astelin.  She has also taken Allegra-D.    She has been diagnosed with cough variant asthma with allergic trigger as an adult in '96.  She has symptoms of cough, wheeze and chest tightness.  She uses albuterol 2-3 times a week with relief of symptoms.    She states she can not do the dry powder inhalers and she will usually get thrush even despite adequate rinsing.  She has tried flovent and advair in the past as well.  She states she has had steroid courses.  She has had at least 1 steroid course in the past year for asthma flare.  She states she had a bout of bronchitis in January and states she didn't start to feel better til April.  Along with bronchitis she had sinus infection and ear infection which is the first ear infection she's had in adulthood.  She state she required multiple rounds of antibiotics and had ear pain for about 8 weeks.  She states she has a deviated septum.  She has seen ENT in the  past and states was recommended to use baking soda and distilled water to perform nasal rinses.    She does not have a history of eczema.   With kiwi she reports having tongue swelling, throat itch, throat closure sensation with difficulty breathing with first sip of a smoothie containing kiwi.  Reaction was about 1 year ago.  She took benadryl. She does not have an epinephrine.  She does not have any issues with latex, bananas or avocados.    Review of systems: Review of Systems  Constitutional: Negative for chills, fever and malaise/fatigue.  HENT: Positive for congestion. Negative for ear discharge, ear pain, nosebleeds, sinus pain, sore throat and tinnitus.   Eyes: Negative for pain, discharge and redness.  Respiratory: Positive for cough, shortness of breath and wheezing. Negative for sputum production.   Cardiovascular: Negative for chest pain.  Gastrointestinal: Negative for abdominal pain, constipation, diarrhea, heartburn, nausea and vomiting.  Musculoskeletal: Negative for joint pain.  Skin: Negative for itching and rash.  Neurological: Negative for headaches.    All other systems negative unless noted above in HPI  Past medical history: Past Medical History:  Diagnosis Date  . Asthma   . Carpal tunnel syndrome 07/07/2015   right    Past surgical history: History reviewed. No pertinent surgical history.  Family history:  Family History  Problem Relation Age of Onset  . Hypertension Mother   .  Other Mother        hx of hysterectomy at 26-56 for prolapsed uterus; hx of benign L arm cyst in her 24s  . Stroke Father   . Hypertension Father   . Skin cancer Father 62       NOS  . Other Father        enlarged prostate s/p surgery  . Alzheimer's disease Maternal Aunt   . Stomach cancer Paternal Aunt        d. 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 Congestive Heart Failure Maternal Grandmother   . Prostate cancer  Maternal Grandfather        dx. 75-76  . Heart Problems Paternal Grandmother   . Heart Problems Paternal Grandfather   . Colon cancer Other        maternal great aunt (MGM's sister) dx in her 19s  . Heart attack Paternal Uncle 47  . Heart Problems Paternal Uncle   . Angina Paternal Uncle   . Breast cancer Cousin        paternal 1st cousin d. 4  . Breast cancer Cousin        paternal 1st cousin dx. 63s    Social history: Lives in a condo with carpeting with electric heating and central cooling.  No pets in the home.  Dogs outside the home.  No concern for water damage, mildew or roaches in the home.  She is a Optometrist.  Denies smoking history.    Medication List: Allergies as of 09/07/2017      Reactions   Poison Oak Extract [poison Oak Extract] Anaphylaxis   Penicillins Hives   Sulfa Antibiotics    Sulfamethoxazole-trimethoprim       Medication List        Accurate as of 09/07/17 11:37 AM. Always use your most recent med list.          albuterol 108 (90 Base) MCG/ACT inhaler Commonly known as:  PROVENTIL HFA;VENTOLIN HFA Inhale into the lungs every 6 (six) hours as needed for wheezing or shortness of breath.   gabapentin 300 MG capsule Commonly known as:  NEURONTIN Take 1 capsule (300 mg total) by mouth at bedtime.   hyoscyamine 0.375 MG 12 hr tablet Commonly known as:  LEVBID Take 1 tablet (0.375 mg total) by mouth 2 (two) times daily.   ipratropium 0.06 % nasal spray Commonly known as:  ATROVENT Place 2 sprays into both nostrils every 12 (twelve) hours.   levocetirizine 5 MG tablet Commonly known as:  XYZAL Take 1 tablet (5 mg total) by mouth every evening.   Melatonin 10 MG Tabs Take by mouth.   montelukast 10 MG tablet Commonly known as:  SINGULAIR Take 1 tablet (10 mg total) by mouth at bedtime.   mupirocin ointment 2 % Commonly known as:  BACTROBAN Apply 1 application topically 3 (three) times daily.   progesterone 100 MG capsule Commonly  known as:  PROMETRIUM   traZODone 50 MG tablet Commonly known as:  DESYREL Take 2-3 tabs po QHS for sleep.       Known medication allergies: Allergies  Allergen Reactions  . Poison Oak Extract [Poison Oak Extract] Anaphylaxis  . Penicillins Hives  . Sulfa Antibiotics   . Sulfamethoxazole-Trimethoprim      Physical examination: Blood pressure 110/70, pulse 68, temperature 98.1 F (36.7 C), resp. rate 17, height 5' 6.5" (1.689 m), weight 138 lb 9.6 oz (62.9  kg), last menstrual period 05/09/2014, SpO2 97 %.  General: Alert, interactive, in no acute distress. HEENT: PERRLA, TMs pearly gray, turbinates moderately edematous without discharge, post-pharynx non erythematous. Neck: Supple without lymphadenopathy. Lungs: Clear to auscultation without wheezing, rhonchi or rales. {no increased work of breathing. CV: Normal S1, S2 without murmurs. Abdomen: Nondistended, nontender. Skin: Warm and dry, without lesions or rashes. Extremities:  No clubbing, cyanosis or edema. Neuro:   Grossly intact.  Diagnositics/Labs:  Spirometry: FEV1: 2.26L 74%, FVC: 2.65L 69%  Post-BD she had a 12% increase in FEV1 to 2.54L which is significant.   Allergy testing: environmental allergy skin prick testing is positive to tree and weed pollens.  Intradermal testing was negative.  Allergy testing results were read and interpreted by provider, documented by clinical staff.   Assessment and plan:   Rhinoconjunctivitis, allergic - environmental allergy skin testing is positive to weeds (burweed marshelder, sheep sorrell), trees (ash, red cedar, cottonwood) - trial Ryvent 5mg  - daily - continue singulair 10mg  daily - take at bedtime - for nasal drainage use Astelin 2 sprays each nostril twice a day - saline rinse twice a day and use prior to nasal spray use - for itchy/watery/red eyes use either Bepreve or Optivar use 1 drop each eye daily as needed - allergen immunotherapy discussed today including  protocol, benefits and risk.  Informational handout provided.  If interested in this therapuetic option you can check with your insurance carrier for coverage.  Let us know if you would like to proceed with this option.   Asthma, mod persistent  - at this time not well controlled  - have access to albuterol inhaler 2 puffs every 4-6 hours as needed for cough/wheeze/shortness of breath/chest tightness.  May use 15-20 minutes prior to activity.   Monitor frequency of use.    - continue singulair 10mg  daily   - Qvar 19mcg redihaler 2 inhalations twice a day  Asthma control goals:   Full participation in all desired activities (may need albuterol before activity)  Albuterol use two time or less a week on average (not counting use with activity)  Cough interfering with sleep two time or less a month  Oral steroids no more than once a year  No hospitalizations  Food allergy    - continue avoidance of kiwi    - have access to self-injectable epinephrine (Epipen or AuviQ) 0.3mg  at all times    - follow emergency action plan in case of allergic reaction  Follow-up 3-4 months or sooner if needed  I appreciate the opportunity to take part in Zafira's care. Please do not hesitate to contact me with questions.  Sincerely,   Prudy Feeler, MD Allergy/Immunology [facility]

## 2017-09-07 NOTE — Telephone Encounter (Signed)
I checked formulary and it looks as if azelastine 0.05% is covered. Please advise and thank you.

## 2017-09-07 NOTE — Telephone Encounter (Signed)
That is fine to do azelastine if that is what is covered.   1 drop each eye as needed twice a day for itch/watery/redy eyes

## 2017-09-08 ENCOUNTER — Telehealth: Payer: Self-pay | Admitting: Allergy

## 2017-09-08 ENCOUNTER — Other Ambulatory Visit: Payer: Self-pay

## 2017-09-08 MED ORDER — BECLOMETHASONE DIPROP HFA 80 MCG/ACT IN AERB: 2.0000 | INHALATION_SPRAY | Freq: Two times a day (BID) | RESPIRATORY_TRACT | 3 refills | Status: DC

## 2017-09-08 MED ORDER — CARBINOXAMINE MALEATE 6 MG PO TABS: 1.0000 | ORAL_TABLET | Freq: Every day | ORAL | 0 refills | Status: DC

## 2017-09-08 NOTE — Telephone Encounter (Signed)
Patient was seen yesterday, 09-07-17, and did not get a prescription for Qvar. She would like that sent to her pharmacy. She was also given samples of Ryvent; she would like a 90 day prescription sent in for that. [facility] [address].

## 2017-09-08 NOTE — Telephone Encounter (Signed)
Gabapentin last filled 06/08/2016 by our office by Dr. Mitchel Honour. Patient needs office visit for refills. Please call to schedule.

## 2017-09-08 NOTE — Telephone Encounter (Signed)
Pt calling back to confirm pharmacy. She would like it sent to [id number] - [place], [place] [address]. AT [address]

## 2017-09-08 NOTE — Telephone Encounter (Signed)
Rx sent to pharmacy   

## 2017-09-11 NOTE — Telephone Encounter (Signed)
mychart message sent to pt about making an apt for more refills °

## 2017-09-14 ENCOUNTER — Other Ambulatory Visit: Payer: BLUE CROSS/BLUE SHIELD

## 2017-09-21 ENCOUNTER — Ambulatory Visit: Payer: BLUE CROSS/BLUE SHIELD | Admitting: Podiatry

## 2017-10-04 ENCOUNTER — Encounter: Payer: Self-pay | Admitting: Gastroenterology

## 2017-10-05 DIAGNOSIS — M2550 Pain in unspecified joint: Secondary | ICD-10-CM | POA: Diagnosis not present

## 2017-10-05 DIAGNOSIS — N951 Menopausal and female climacteric states: Secondary | ICD-10-CM | POA: Diagnosis not present

## 2017-10-05 DIAGNOSIS — G479 Sleep disorder, unspecified: Secondary | ICD-10-CM | POA: Diagnosis not present

## 2017-10-05 DIAGNOSIS — E039 Hypothyroidism, unspecified: Secondary | ICD-10-CM | POA: Diagnosis not present

## 2017-10-05 DIAGNOSIS — R5383 Other fatigue: Secondary | ICD-10-CM | POA: Diagnosis not present

## 2017-10-10 DIAGNOSIS — M2550 Pain in unspecified joint: Secondary | ICD-10-CM | POA: Diagnosis not present

## 2017-10-10 DIAGNOSIS — M357 Hypermobility syndrome: Secondary | ICD-10-CM | POA: Diagnosis not present

## 2017-10-10 DIAGNOSIS — M79643 Pain in unspecified hand: Secondary | ICD-10-CM | POA: Diagnosis not present

## 2017-10-10 DIAGNOSIS — M549 Dorsalgia, unspecified: Secondary | ICD-10-CM | POA: Diagnosis not present

## 2017-10-18 DIAGNOSIS — J45909 Unspecified asthma, uncomplicated: Secondary | ICD-10-CM | POA: Diagnosis not present

## 2017-10-18 DIAGNOSIS — J309 Allergic rhinitis, unspecified: Secondary | ICD-10-CM | POA: Diagnosis not present

## 2017-10-18 DIAGNOSIS — G479 Sleep disorder, unspecified: Secondary | ICD-10-CM | POA: Diagnosis not present

## 2017-10-18 DIAGNOSIS — N951 Menopausal and female climacteric states: Secondary | ICD-10-CM | POA: Diagnosis not present

## 2017-10-18 DIAGNOSIS — Z0000 Encounter for general adult medical examination without abnormal findings: Secondary | ICD-10-CM | POA: Diagnosis not present

## 2017-10-18 DIAGNOSIS — R5383 Other fatigue: Secondary | ICD-10-CM | POA: Diagnosis not present

## 2017-10-18 DIAGNOSIS — R232 Flushing: Secondary | ICD-10-CM | POA: Diagnosis not present

## 2017-10-18 DIAGNOSIS — Z1211 Encounter for screening for malignant neoplasm of colon: Secondary | ICD-10-CM | POA: Diagnosis not present

## 2017-10-25 ENCOUNTER — Other Ambulatory Visit: Payer: Self-pay | Admitting: Family Medicine

## 2017-10-25 DIAGNOSIS — Z13220 Encounter for screening for lipoid disorders: Secondary | ICD-10-CM | POA: Diagnosis not present

## 2017-10-25 DIAGNOSIS — E2839 Other primary ovarian failure: Secondary | ICD-10-CM

## 2017-10-25 DIAGNOSIS — Z0000 Encounter for general adult medical examination without abnormal findings: Secondary | ICD-10-CM | POA: Diagnosis not present

## 2017-10-25 DIAGNOSIS — Z8249 Family history of ischemic heart disease and other diseases of the circulatory system: Secondary | ICD-10-CM | POA: Diagnosis not present

## 2017-10-30 ENCOUNTER — Other Ambulatory Visit: Payer: Self-pay | Admitting: Rheumatology

## 2017-10-30 DIAGNOSIS — M533 Sacrococcygeal disorders, not elsewhere classified: Secondary | ICD-10-CM

## 2017-11-05 ENCOUNTER — Ambulatory Visit
Admission: RE | Admit: 2017-11-05 | Discharge: 2017-11-05 | Disposition: A | Discharge status: Home / Self Care | Payer: BLUE CROSS/BLUE SHIELD | Source: Ambulatory Visit | Attending: Rheumatology | Admitting: Rheumatology

## 2017-11-05 DIAGNOSIS — M533 Sacrococcygeal disorders, not elsewhere classified: Secondary | ICD-10-CM

## 2017-11-05 DIAGNOSIS — M25551 Pain in right hip: Secondary | ICD-10-CM | POA: Diagnosis not present

## 2017-11-05 MED ORDER — GADOBENATE DIMEGLUMINE 529 MG/ML IV SOLN
12.0000 mL | Freq: Once | INTRAVENOUS | Status: AC | PRN
  Administered 2017-11-05: 12 mL via INTRAVENOUS

## 2017-11-07 ENCOUNTER — Ambulatory Visit: Payer: BLUE CROSS/BLUE SHIELD | Admitting: Podiatry

## 2017-11-07 DIAGNOSIS — M2011 Hallux valgus (acquired), right foot: Secondary | ICD-10-CM

## 2017-11-07 DIAGNOSIS — M2012 Hallux valgus (acquired), left foot: Secondary | ICD-10-CM

## 2017-11-07 NOTE — Progress Notes (Signed)
She presents today concerned about hammertoe deformity and whether not we should do surgery only hammertoes at the same time we were working on her bunion.  States that really the hammertoes do not bother her but she is recently started up running and has noticed more hammertoe deformity.  Her graph objective: Vital signs are stable she is alert and oriented x3 bunion deformities are still present and tender on palpation.  Excellent hammertoe deformities are present and have been present since last visit.  Assessment hallux valgus deformities mild hammertoe deformities.  Plan: I recommended that we continue with current plan and not do anything to the hammertoes at this time.

## 2017-11-09 ENCOUNTER — Ambulatory Visit (AMBULATORY_SURGERY_CENTER): Payer: Self-pay | Admitting: *Deleted

## 2017-11-09 ENCOUNTER — Other Ambulatory Visit: Payer: Self-pay | Admitting: Gastroenterology

## 2017-11-09 VITALS — Ht 66.0 in | Wt 138.0 lb

## 2017-11-09 DIAGNOSIS — Z1211 Encounter for screening for malignant neoplasm of colon: Secondary | ICD-10-CM

## 2017-11-09 MED ORDER — PEG-KCL-NACL-NASULF-NA ASC-C 140 G PO SOLR: 1.0000 | ORAL | 0 refills | Status: DC

## 2017-11-09 NOTE — Progress Notes (Signed)
No egg or soy allergy known to patient  No issues with past sedation with any surgeries  or procedures, no intubation problems  No diet pills per patient No home 02 use per patient  No blood thinners per patient  Pt denies issues with constipation  No A fib or A flutter  EMMI video sent to pt's e mail  [employer] coupon to pt in PV today  Pt has no care partner for the afternoon appt 8-15- Pt Rs with me in PV for a morning 10-4 FRi at 8 am  And another PV 9-20 at 4 pm

## 2017-11-23 ENCOUNTER — Encounter: Payer: Self-pay | Admitting: Gastroenterology

## 2017-12-05 ENCOUNTER — Ambulatory Visit
Admission: RE | Admit: 2017-12-05 | Discharge: 2017-12-05 | Disposition: A | Discharge status: Home / Self Care | Payer: BLUE CROSS/BLUE SHIELD | Source: Ambulatory Visit | Attending: Family Medicine | Admitting: Family Medicine

## 2017-12-05 DIAGNOSIS — Z780 Asymptomatic menopausal state: Secondary | ICD-10-CM | POA: Diagnosis not present

## 2017-12-05 DIAGNOSIS — E2839 Other primary ovarian failure: Secondary | ICD-10-CM

## 2017-12-05 DIAGNOSIS — M85851 Other specified disorders of bone density and structure, right thigh: Secondary | ICD-10-CM | POA: Diagnosis not present

## 2017-12-14 ENCOUNTER — Telehealth: Payer: Self-pay | Admitting: Family Medicine

## 2017-12-14 NOTE — Telephone Encounter (Signed)
Called pt to try and reschedule appt with Dr. Brigitte Pulse. Left VM for pt to call the office and reschedule an appt. When pt calls back, please reschedule at their convenience for:   follow up on chronic conditions  Thank you!

## 2017-12-18 ENCOUNTER — Other Ambulatory Visit: Payer: Self-pay | Admitting: Physician Assistant

## 2017-12-18 DIAGNOSIS — H9203 Otalgia, bilateral: Secondary | ICD-10-CM

## 2017-12-18 DIAGNOSIS — Q796 Ehlers-Danlos syndrome: Secondary | ICD-10-CM | POA: Diagnosis not present

## 2017-12-18 DIAGNOSIS — M797 Fibromyalgia: Secondary | ICD-10-CM | POA: Diagnosis not present

## 2017-12-18 DIAGNOSIS — R42 Dizziness and giddiness: Secondary | ICD-10-CM | POA: Diagnosis not present

## 2017-12-18 NOTE — Telephone Encounter (Signed)
Patient called, left VM to call back to speak to a triage nurse as to why the requested medication is needed.

## 2017-12-19 ENCOUNTER — Other Ambulatory Visit: Payer: Self-pay | Admitting: Physician Assistant

## 2017-12-19 DIAGNOSIS — H9203 Otalgia, bilateral: Secondary | ICD-10-CM

## 2017-12-19 NOTE — Addendum Note (Signed)
Addended by: Theresia Lo on: 12/19/2017 04:13 PM   Modules accepted: Orders

## 2017-12-20 ENCOUNTER — Encounter: Payer: Self-pay | Admitting: *Deleted

## 2017-12-20 NOTE — Telephone Encounter (Signed)
Left message for pt. To call back and discuss if she is having ear pain again.

## 2017-12-20 NOTE — Progress Notes (Signed)
Vials made. Exp: 12-21-18. hv 

## 2017-12-22 DIAGNOSIS — J301 Allergic rhinitis due to pollen: Secondary | ICD-10-CM

## 2017-12-25 ENCOUNTER — Other Ambulatory Visit: Payer: Self-pay

## 2017-12-25 ENCOUNTER — Ambulatory Visit (INDEPENDENT_AMBULATORY_CARE_PROVIDER_SITE_OTHER): Payer: BLUE CROSS/BLUE SHIELD | Admitting: Family Medicine

## 2017-12-25 ENCOUNTER — Ambulatory Visit: Payer: BLUE CROSS/BLUE SHIELD | Admitting: Family Medicine

## 2017-12-25 ENCOUNTER — Telehealth: Payer: Self-pay | Admitting: Family Medicine

## 2017-12-25 ENCOUNTER — Encounter: Payer: Self-pay | Admitting: Family Medicine

## 2017-12-25 ENCOUNTER — Encounter (HOSPITAL_COMMUNITY): Payer: Self-pay | Admitting: Emergency Medicine

## 2017-12-25 ENCOUNTER — Emergency Department (HOSPITAL_COMMUNITY)
Admission: EM | Admit: 2017-12-25 | Discharge: 2017-12-25 | Disposition: A | Discharge status: Home / Self Care | Payer: BLUE CROSS/BLUE SHIELD | Attending: Emergency Medicine | Admitting: Emergency Medicine

## 2017-12-25 VITALS — BP 106/71 | HR 80 | Temp 98.2°F | Resp 16 | Ht 66.0 in | Wt 140.0 lb

## 2017-12-25 DIAGNOSIS — R21 Rash and other nonspecific skin eruption: Secondary | ICD-10-CM | POA: Diagnosis not present

## 2017-12-25 DIAGNOSIS — G8929 Other chronic pain: Secondary | ICD-10-CM

## 2017-12-25 DIAGNOSIS — J45909 Unspecified asthma, uncomplicated: Secondary | ICD-10-CM | POA: Diagnosis not present

## 2017-12-25 DIAGNOSIS — Z79899 Other long term (current) drug therapy: Secondary | ICD-10-CM | POA: Insufficient documentation

## 2017-12-25 DIAGNOSIS — M545 Low back pain: Secondary | ICD-10-CM | POA: Insufficient documentation

## 2017-12-25 DIAGNOSIS — L709 Acne, unspecified: Secondary | ICD-10-CM | POA: Diagnosis not present

## 2017-12-25 DIAGNOSIS — Q796 Ehlers-Danlos syndrome: Secondary | ICD-10-CM | POA: Diagnosis not present

## 2017-12-25 DIAGNOSIS — R6889 Other general symptoms and signs: Secondary | ICD-10-CM | POA: Diagnosis not present

## 2017-12-25 DIAGNOSIS — R220 Localized swelling, mass and lump, head: Secondary | ICD-10-CM | POA: Diagnosis not present

## 2017-12-25 DIAGNOSIS — L309 Dermatitis, unspecified: Secondary | ICD-10-CM | POA: Diagnosis not present

## 2017-12-25 MED ORDER — PREDNISONE 20 MG PO TABS: 20.0000 mg | ORAL_TABLET | Freq: Two times a day (BID) | ORAL | 0 refills | Status: DC

## 2017-12-25 MED ORDER — FAMOTIDINE 20 MG PO TABS: 20.0000 mg | ORAL_TABLET | Freq: Two times a day (BID) | ORAL | 0 refills | Status: DC

## 2017-12-25 MED ORDER — CLINDAMYCIN PHOSPHATE 1 % EX GEL: Freq: Two times a day (BID) | CUTANEOUS | 0 refills | Status: DC

## 2017-12-25 MED ORDER — DIAZEPAM 5 MG PO TABS: 5.0000 mg | ORAL_TABLET | Freq: Two times a day (BID) | ORAL | 0 refills | Status: DC

## 2017-12-25 NOTE — Progress Notes (Signed)
Subjective:  By signing my name below, I, Lindsey [name], attest that this documentation has been prepared under the direction and in the presence of Wendie Agreste, MD Electronically Signed: Ladene Artist, ED Scribe 12/25/2017 at 5:59 PM.   Patient ID: Lindsey [name], female    DOB: Aug 18, 1965, 52 y.o.   MRN: [id number]  Chief Complaint  Patient presents with  . Rash    pt states she thinks she has had an allergic reaction on her face x 2 months. Pt states the swelling and rash will not go away    HPI Lindsey [name] is a 52 y.o. female who presents to Primary Care at [facility] complaining of rash to her L cheek x 2 months. Pt initially thought it was contact dermatitis from her cell phone but has noticed that rash has spread to her neck, the other side of her face, eye puffiness for a few wks that has worsened over the past few wks, worsening throat swelling feeling and trouble swallowing for a few days and lip swelling upon waking in the mornings over the past few days. She also thought she was allergic to eye drop, so switched to restasis on 8/31. She did change soaps a few months ago but denies rash over her body so she switched back to original soap for a few months. Pt has noticed itching to her sides without rash. She has tried benadryl every 4 hrs, hydrocortisone cream qd for sev wks, akne mycin cream qd for sev wks (old Rx), OTC antiseptic, antifungal qd without improvement. Denies tongue swelling, sob, wheezing, new hair products, new make-up, h/o angioedema. Pt has been eval twice for the same symptoms at [facility], referred to dermatology and has allergist Dr. Nelva Bush with next appointment scheduled for 9/23. Reports that she is supposed to start allergy shots.  Patient Active Problem List   Diagnosis Date Noted  . Hypertrophy of vulva 07/20/2017  . Osteoarthritis 04/01/2017  . Chronic pain syndrome 08/10/2016  . Fibromyalgia 08/10/2016  . Lumbar radiculopathy 02/16/2016  . Chronic cough  12/02/2015  . Irritable larynx syndrome 12/02/2015  . Genetic testing 09/21/2015  . Family history of breast cancer in female 08/26/2015  . Fibrocystic breast changes of both breasts 07/19/2015  . Carpal tunnel syndrome 07/07/2015  . Primary arthrosis of first carpometacarpal joints, bilateral 06/01/2015  . Ehlers-Danlos syndrome 05/11/2015  . Sacroiliac joint dysfunction of right side 11/26/2014  . Hypermobility syndrome 11/26/2014  . Essential alopecia of women 04/14/2011  . Inflammatory dermatosis 04/14/2011   Past Medical History:  Diagnosis Date  . Allergy   . Arthritis   . Asthma   . Carpal tunnel syndrome 07/07/2015   right  . Ehlers-Danlos syndrome   . Fibromyalgia   . Osteopenia    Past Surgical History:  Procedure Laterality Date  . DENTAL SURGERY     Allergies  Allergen Reactions  . Kiwi Extract Anaphylaxis  . Poison Oak Extract [Poison Oak Extract] Anaphylaxis  . Penicillins Hives  . Sulfa Antibiotics Nausea And Vomiting  . Sulfamethoxazole-Trimethoprim    Prior to Admission medications   Medication Sig Start Date End Date Taking? Authorizing Provider  acetaminophen (TYLENOL) 500 MG tablet Take 1,000 mg by mouth 2 (two) times daily after a meal.   Yes [provider]  albuterol (PROVENTIL HFA;VENTOLIN HFA) 108 (90 Base) MCG/ACT inhaler Inhale into the lungs every 6 (six) hours as needed for wheezing or shortness of breath.   Yes [provider]  Ascorbic  Acid (VITAMIN C) 1000 MG tablet Take 1,200 mg by mouth daily.   Yes [provider]  azelastine (ASTELIN) 0.1 % nasal spray Place 2 sprays into both nostrils 2 (two) times daily. 09/07/17  Yes Padgett, Rae Halsted, MD  azelastine (OPTIVAR) 0.05 % ophthalmic solution Place 1 drop into both eyes 2 (two) times daily. 09/07/17  Yes Padgett, Rae Halsted, MD  beclomethasone (QVAR REDIHALER) 80 MCG/ACT inhaler Inhale 2 puffs into the lungs 2 (two) times daily. 09/08/17  Yes Padgett,  Rae Halsted, MD  Bepotastine Besilate (BEPREVE) 1.5 % SOLN Place 1 drop into both eyes daily as needed. 09/07/17  Yes Padgett, Rae Halsted, MD  Carbinoxamine Maleate (RYVENT) 6 MG TABS Take 1 tablet by mouth daily. 09/08/17  Yes Padgett, Rae Halsted, MD  cholecalciferol (VITAMIN D) 1000 units tablet Take 10,000 Units by mouth daily.   Yes [provider]  Cyanocobalamin (VITAMIN B 12 PO) Take 5,000 mcg by mouth daily.   Yes [provider]  DENTA 5000 PLUS 1.1 % CREA dental cream See admin instructions. 10/12/17  Yes [provider]  DULoxetine (CYMBALTA) 30 MG capsule Take 60 mg by mouth as needed.  10/26/17  Yes [provider]  EPINEPHrine 0.3 mg/0.3 mL IJ SOAJ injection INJECT 3MLS INTO THE MUSCLE ONCE FOR 1 DOSE 10/27/17  Yes [provider]  fexofenadine (ALLEGRA) 180 MG tablet Take 180 mg by mouth daily. 10/25/17  Yes [provider]  gabapentin (NEURONTIN) 300 MG capsule Take 1 capsule (300 mg total) by mouth at bedtime. 06/08/16  Yes Sagardia, Ines Bloomer, MD  hyoscyamine (LEVBID) 0.375 MG 12 hr tablet Take 1 tablet (0.375 mg total) by mouth 2 (two) times daily. 05/31/17  Yes Shawnee Knapp, MD  ipratropium (ATROVENT) 0.06 % nasal spray Place 2 sprays into both nostrils every 12 (twelve) hours. 06/08/16  Yes Sagardia, Ines Bloomer, MD  levocetirizine (XYZAL) 5 MG tablet Take 1 tablet (5 mg total) by mouth every evening. 05/31/17  Yes Shawnee Knapp, MD  Lifitegrast Shirley Friar) 5 % SOLN Apply to eye as needed.   Yes [provider]  Melatonin 10 MG TABS Take by mouth.   Yes [provider]  montelukast (SINGULAIR) 10 MG tablet Take 1 tablet (10 mg total) by mouth at bedtime. 08/31/16  Yes Shawnee Knapp, MD  mupirocin ointment (BACTROBAN) 2 % Apply 1 application topically 3 (three) times daily. 12/20/16  Yes Sagardia, Ines Bloomer, MD  PEG-KCl-NaCl-NaSulf-Na Asc-C (PLENVU) 140 g SOLR Take 1 kit by mouth as directed. 11/09/17  Yes  Doran Stabler, MD  progesterone (PROMETRIUM) 100 MG capsule  09/10/15  Yes [provider]  RESTASIS 0.05 % ophthalmic emulsion PUT 1 DROP IN BOTH EYES TWICE DAILY AS DIRECTED 12/09/17  Yes [provider]  traZODone (DESYREL) 50 MG tablet Take 2-3 tabs po QHS for sleep. 06/08/16  Yes Horald Pollen, MD   Social History   Socioeconomic History  . Marital status: Single    Spouse name: n/a  . Number of children: 0  . Years of education: Not on file  . Highest education level: Not on file  Occupational History  . Occupation: Education administrator    Comment: [place]  Social Needs  . Financial resource strain: Not on file  . Food insecurity:    Worry: Not on file    Inability: Not on file  . Transportation needs:    Medical: Not on file    Non-medical:  Not on file  Tobacco Use  . Smoking status: Never Smoker  . Smokeless tobacco: Never Used  Substance and Sexual Activity  . Alcohol use: No    Alcohol/week: 0.0 standard drinks  . Drug use: No  . Sexual activity: Not on file  Lifestyle  . Physical activity:    Days per week: Not on file    Minutes per session: Not on file  . Stress: Not on file  Relationships  . Social connections:    Talks on phone: Not on file    Gets together: Not on file    Attends religious service: Not on file    Active member of club or organization: Not on file    Attends meetings of clubs or organizations: Not on file    Relationship status: Not on file  . Intimate partner violence:    Fear of current or ex partner: Not on file    Emotionally abused: Not on file    Physically abused: Not on file    Forced sexual activity: Not on file  Other Topics Concern  . Not on file  Social History Narrative  . Not on file   Review of Systems  HENT: Positive for facial swelling. Negative for trouble swallowing.   Respiratory: Negative for shortness of breath and wheezing.   Skin: Positive for rash.        Objective:   Physical Exam  Constitutional: She is oriented to person, place, and time. She appears well-developed and well-nourished. No distress.  HENT:  Head: Normocephalic and atraumatic.  Mouth/Throat: Uvula is midline. No uvula swelling.  Mouth/Throat: No posterior oropharynx rash or swelling. No apparent tongue swelling. Face: Fine papules, along cheeks. Few erythematous papules along chin without apparent vesicles. Excoriation on L angle of jaw. No intraoral lesions. Few erythematous patches along L neck.  Eyes: Pupils are equal, round, and reactive to light. Conjunctivae and EOM are normal.  Does appear to have slight edema of infraorbital skin R>L.  Neck: Neck supple. No tracheal deviation present.  Cardiovascular: Normal rate, regular rhythm and normal heart sounds.  Pulmonary/Chest: Effort normal and breath sounds normal. No respiratory distress.  Musculoskeletal: Normal range of motion.  Neurological: She is alert and oriented to person, place, and time.  Skin: Skin is warm and dry. Rash noted.  Compared to self picture on phone. Slight fullness of upper lids and infraorbital skin, minimal change from picture.  Psychiatric: She has a normal mood and affect. Her behavior is normal.  Nursing note and vitals reviewed.  Vitals:   12/25/17 1736  BP: 106/71  Pulse: 80  Resp: 16  Temp: 98.2 F (36.8 C)  TempSrc: Oral  SpO2: 96%  Weight: 140 lb (63.5 kg)  Height: 5' 6"  (1.676 m)      Assessment & Plan:   Lindsey [name] is a 52 y.o. female Acne, unspecified acne type - Plan: clindamycin (CLINDAGEL) 1 % gel  Rash of face  Swelling of face  Sensation of swollen throat  Approximately 33-monthhistory of rash on face, initially suspected to be contact dermatitis versus other allergic cause.  Has tried multiple treatments including topical cortisone cream, antimicrobial ointment, antifungal cream.  Also changed back to her usual soap, no other new substances, medications,  dermatologic products.   Now with subjective worsening of symptoms with feeling of throat swelling and trouble swallowing, subjective lip swelling past 2 days, in spite of taking 50 mg Benadryl every 4 hours.  On evaluation in  office I do see some erythematous rash on face and areas of neck, but does not appear to have significant lip swelling when compared to picture on phone or license.  I do not see any swelling in posterior oropharynx but with these progressive symptoms decision was made for her to have further evaluation through emergency room, possible initial steroids with allergist follow-up.  For her acne symptoms did initially write for clindamycin gel, can follow-up with primary care provider or allergist to change if needed.  Did recommend she wait to start that until current symptoms have improved to minimize new medication  Meds ordered this encounter  Medications  . clindamycin (CLINDAGEL) 1 % gel    Sig: Apply topically 2 (two) times daily. To acne areas.    Dispense:  30 g    Refill:  0   Patient Instructions   I agree with ER evaluation for your current symptoms, including throat and lip symptoms, then follow up with allergist as planned next week to discuss further.   Clindamycin topical can be used initially for acne treatment, but can discuss this with allergist as well, I would not start that until current symptoms have improved.   If you have lab work done today you will be contacted with your lab results within the next 2 weeks.  If you have not heard from [place] then please contact us. The fastest way to get your results is to register for My Chart.   IF you received an x-ray today, you will receive an invoice from [facility]. Please contact [facility] at [phone] with questions or concerns regarding your invoice.   IF you received labwork today, you will receive an invoice from [facility]. Please contact [facility] at [phone] with questions or  concerns regarding your invoice.   Our billing staff will not be able to assist you with questions regarding bills from these companies.  You will be contacted with the lab results as soon as they are available. The fastest way to get your results is to activate your My Chart account. Instructions are located on the last page of this paperwork. If you have not heard from [place] regarding the results in 2 weeks, please contact this office.       I personally performed the services described in this documentation, which was scribed in my presence. The recorded information has been reviewed and considered for accuracy and completeness, addended by me as needed, and agree with information above.  Signed,   [name], MD [facility] at [facility].  12/25/17 7:13 PM

## 2017-12-25 NOTE — Patient Instructions (Addendum)
I agree with ER evaluation for your current symptoms, including throat and lip symptoms, then follow up with allergist as planned next week to discuss further.   Clindamycin topical can be used initially for acne treatment, but can discuss this with allergist as well, I would not start that until current symptoms have improved.   If you have lab work done today you will be contacted with your lab results within the next 2 weeks.  If you have not heard from [place] then please contact us. The fastest way to get your results is to register for My Chart.   IF you received an x-ray today, you will receive an invoice from [facility]. Please contact [facility] at [phone] with questions or concerns regarding your invoice.   IF you received labwork today, you will receive an invoice from [place]. Please contact [facility] at [phone] with questions or concerns regarding your invoice.   Our billing staff will not be able to assist you with questions regarding bills from these companies.  You will be contacted with the lab results as soon as they are available. The fastest way to get your results is to activate your My Chart account. Instructions are located on the last page of this paperwork. If you have not heard from [place] regarding the results in 2 weeks, please contact this office.

## 2017-12-25 NOTE — ED Provider Notes (Signed)
[facility] DEPT Provider Note   CSN: [id number] Arrival date & time: 12/25/17  1901     History   Chief Complaint Chief Complaint  Patient presents with  . Facial Swelling  . Back Pain    HPI Lindsey [name] is a 52 y.o. female who presents to the ED from Urgent Care for further evaluation of her facial swelling. Per Urgent Care note patient presented to Primary Care at [facility] complaining of rash to her L cheek x 2 months. Pt initially thought it was contact dermatitis from her cell phone but has noticed that rash has spread to her neck, the other side of her face, eye puffiness for a few wks that has worsened over the past few wks, worsening throat feels swollen and trouble swallowing for a few days and lip swelling upon waking in the mornings over the past few days. She also thought she was allergic to eye drop, so switched to restasis on 8/31. She did change soaps a few months ago but denies rash over her body so she switched back to original soap for a few months. Pt has noticed itching to her sides without rash. She has tried benadryl every 4 hrs, hydrocortisone cream qd for sev wks, akne mycin cream qd for sev wks (old Rx), OTC antiseptic, antifungal qd without improvement. Denies tongue swelling, sob, wheezing, new hair products, new make-up, h/o angioedema. Pt has been eval twice for the same symptoms at [facility], referred to dermatology and has allergist Dr. Nelva Bush with next appointment scheduled for 9/23. Reports that she is supposed to start allergy shots. Patient has not seen the dermatologist yet.   HPI  Past Medical History:  Diagnosis Date  . Allergy   . Arthritis   . Asthma   . Carpal tunnel syndrome 07/07/2015   right  . Ehlers-Danlos syndrome   . Fibromyalgia   . Osteopenia     Patient Active Problem List   Diagnosis Date Noted  . Hypertrophy of vulva 07/20/2017  . Osteoarthritis 04/01/2017  . Chronic pain syndrome 08/10/2016  .  Fibromyalgia 08/10/2016  . Lumbar radiculopathy 02/16/2016  . Chronic cough 12/02/2015  . Irritable larynx syndrome 12/02/2015  . Genetic testing 09/21/2015  . Family history of breast cancer in female 08/26/2015  . Fibrocystic breast changes of both breasts 07/19/2015  . Carpal tunnel syndrome 07/07/2015  . Primary arthrosis of first carpometacarpal joints, bilateral 06/01/2015  . Ehlers-Danlos syndrome 05/11/2015  . Sacroiliac joint dysfunction of right side 11/26/2014  . Hypermobility syndrome 11/26/2014  . Essential alopecia of women 04/14/2011  . Inflammatory dermatosis 04/14/2011    Past Surgical History:  Procedure Laterality Date  . DENTAL SURGERY       OB History   None      Home Medications    Prior to Admission medications   Medication Sig Start Date End Date Taking? Authorizing Provider  acetaminophen (TYLENOL) 500 MG tablet Take 1,000 mg by mouth 2 (two) times daily after a meal.    [provider]  albuterol (PROVENTIL HFA;VENTOLIN HFA) 108 (90 Base) MCG/ACT inhaler Inhale into the lungs every 6 (six) hours as needed for wheezing or shortness of breath.    [provider]  Ascorbic Acid (VITAMIN C) 1000 MG tablet Take 1,200 mg by mouth daily.    [provider]  azelastine (ASTELIN) 0.1 % nasal spray Place 2 sprays into both nostrils 2 (two) times daily. 09/07/17   Kennith Gain, MD  azelastine (  OPTIVAR) 0.05 % ophthalmic solution Place 1 drop into both eyes 2 (two) times daily. 09/07/17   Kennith Gain, MD  beclomethasone (QVAR REDIHALER) 80 MCG/ACT inhaler Inhale 2 puffs into the lungs 2 (two) times daily. 09/08/17   Kennith Gain, MD  Bepotastine Besilate (BEPREVE) 1.5 % SOLN Place 1 drop into both eyes daily as needed. 09/07/17   Kennith Gain, MD  Carbinoxamine Maleate (RYVENT) 6 MG TABS Take 1 tablet by mouth daily. 09/08/17   Kennith Gain, MD  cholecalciferol (VITAMIN D) 1000  units tablet Take 10,000 Units by mouth daily.    [provider]  clindamycin (CLINDAGEL) 1 % gel Apply topically 2 (two) times daily. To acne areas. 12/25/17   Wendie Agreste, MD  Cyanocobalamin (VITAMIN B 12 PO) Take 5,000 mcg by mouth daily.    [provider]  DENTA 5000 PLUS 1.1 % CREA dental cream See admin instructions. 10/12/17   [provider]  diazepam (VALIUM) 5 MG tablet Take 1 tablet (5 mg total) by mouth 2 (two) times daily. 12/25/17   Ashley Murrain, NP  DULoxetine (CYMBALTA) 30 MG capsule Take 60 mg by mouth as needed.  10/26/17   [provider]  EPINEPHrine 0.3 mg/0.3 mL IJ SOAJ injection INJECT 3MLS INTO THE MUSCLE ONCE FOR 1 DOSE 10/27/17   [provider]  famotidine (PEPCID) 20 MG tablet Take 1 tablet (20 mg total) by mouth 2 (two) times daily. 12/25/17   Ashley Murrain, NP  fexofenadine (ALLEGRA) 180 MG tablet Take 180 mg by mouth daily. 10/25/17   [provider]  gabapentin (NEURONTIN) 300 MG capsule Take 1 capsule (300 mg total) by mouth at bedtime. 06/08/16   Horald Pollen, MD  hyoscyamine (LEVBID) 0.375 MG 12 hr tablet Take 1 tablet (0.375 mg total) by mouth 2 (two) times daily. 05/31/17   Shawnee Knapp, MD  ipratropium (ATROVENT) 0.06 % nasal spray Place 2 sprays into both nostrils every 12 (twelve) hours. 06/08/16   Horald Pollen, MD  levocetirizine (XYZAL) 5 MG tablet Take 1 tablet (5 mg total) by mouth every evening. 05/31/17   Shawnee Knapp, MD  Lifitegrast Shirley Friar) 5 % SOLN Apply to eye as needed.    [provider]  Melatonin 10 MG TABS Take by mouth.    [provider]  montelukast (SINGULAIR) 10 MG tablet Take 1 tablet (10 mg total) by mouth at bedtime. 08/31/16   Shawnee Knapp, MD  mupirocin ointment (BACTROBAN) 2 % Apply 1 application topically 3 (three) times daily. 12/20/16   Horald Pollen, MD  PEG-KCl-NaCl-NaSulf-Na Asc-C (PLENVU) 140 g SOLR Take 1 kit by mouth as directed. 11/09/17    Doran Stabler, MD  predniSONE (DELTASONE) 20 MG tablet Take 1 tablet (20 mg total) by mouth 2 (two) times daily with a meal. 12/25/17   Janit Bern, [place], NP  progesterone (PROMETRIUM) 100 MG capsule  09/10/15   [provider]  RESTASIS 0.05 % ophthalmic emulsion PUT 1 DROP IN BOTH EYES TWICE DAILY AS DIRECTED 12/09/17   [provider]  traZODone (DESYREL) 50 MG tablet Take 2-3 tabs po QHS for sleep. 06/08/16   Horald Pollen, MD    Family History Family History  Problem Relation Age of Onset  . Hypertension Mother   . Other Mother        hx of hysterectomy at 67-56 for prolapsed uterus; hx of benign L arm cyst in  her 21s  . Stroke Father   . Hypertension Father   . Skin cancer Father 67       NOS  . Other Father        enlarged prostate s/p surgery  . Alzheimer's disease Maternal Aunt   . Stomach cancer Paternal Aunt        d. 7; was a Manufacturing engineer and did not go to the doctor, so not sure age of onset  . Congestive Heart Failure Maternal Grandmother   . Prostate cancer Maternal Grandfather        dx. 75-76  . Heart Problems Paternal Grandmother   . Heart Problems Paternal Grandfather   . Colon cancer Other        maternal great aunt (MGM's sister) dx in her 89s  . Heart attack Paternal Uncle 62  . Heart Problems Paternal Uncle   . Angina Paternal Uncle   . Breast cancer Cousin        paternal 1st cousin d. 72  . Breast cancer Cousin        paternal 1st cousin dx. 27s  . Colon polyps Neg Hx   . Esophageal cancer Neg Hx   . Rectal cancer Neg Hx     Social History Social History   Tobacco Use  . Smoking status: Never Smoker  . Smokeless tobacco: Never Used  Substance Use Topics  . Alcohol use: No    Alcohol/week: 0.0 standard drinks  . Drug use: No     Allergies   Kiwi extract; Poison oak extract [poison oak extract]; Penicillins; Sulfa antibiotics; and Sulfamethoxazole-trimethoprim   Review of Systems Review of Systems    Constitutional: Negative for chills, fever and unexpected weight change.  HENT: Positive for facial swelling, sore throat and trouble swallowing.   Eyes: Positive for itching. Negative for visual disturbance.  Respiratory: Negative for cough.        Has had episode of asthma but no problem now.  Cardiovascular: Negative for chest pain.  Gastrointestinal: Negative for abdominal pain, diarrhea, nausea and vomiting.  Genitourinary: Negative for dysuria, frequency and urgency.  Musculoskeletal: Positive for back pain (chronic).  Skin: Positive for rash.  Neurological: Negative for syncope and headaches.  Psychiatric/Behavioral: Negative for confusion.     Physical Exam Updated Vital Signs BP 120/90 (BP Location: Left Arm)   Pulse (!) 55   Temp 98.2 F (36.8 C) (Oral)   Resp 18   Ht 5' 6"  (1.676 m)   Wt 63.5 kg   LMP 05/09/2014   SpO2 100%   BMI 22.60 kg/m   Physical Exam  Constitutional: She appears well-developed and well-nourished. No distress.  HENT:  Head: Atraumatic.  Mouth/Throat: Uvula is midline and mucous membranes are normal. No posterior oropharyngeal edema or posterior oropharyngeal erythema.  Papular rash to face and neck, minimal swelling noted.   Eyes: Pupils are equal, round, and reactive to light. Conjunctivae and EOM are normal.  Neck: Normal range of motion. Neck supple.  Cardiovascular: Normal rate and regular rhythm.  Pulmonary/Chest: Effort normal and breath sounds normal. No stridor. She has no wheezes. She has no rales.  Musculoskeletal:       Lumbar back: She exhibits tenderness and spasm. Decreased range of motion: due to pain.  Neurological: She is alert. She has normal strength. Gait normal.  Skin: Skin is warm and dry.  Psychiatric: She has a normal mood and affect. Her behavior is normal.  Nursing note and vitals reviewed.  ED Treatments / Results  Labs (all labs ordered are listed, but only abnormal results are displayed) Labs Reviewed  - No data to display Radiology No results found.  Procedures Procedures (including critical care time)  Medications Ordered in ED Medications - No data to display   Initial Impression / Assessment and Plan / ED Course  I have reviewed the triage vital signs and the nursing notes. 51 y.o. female here with possible allergic reaction stable for d/c without respiratory symptoms and no swelling of the throat or difficulty swallowing. Dr. Eulis Foster in to examine the patient. Will treat with prednisone and pepcid for the rash and will treat with Valium for the muscle spasm of the back. Patient to f/u with PCP. Return precautions discussed.   Final Clinical Impressions(s) / ED Diagnoses   Final diagnoses:  Facial swelling  Chronic bilateral low back pain without sciatica  Rash of face    ED Discharge Orders         Ordered    diazepam (VALIUM) 5 MG tablet  2 times daily     12/25/17 2035    predniSONE (DELTASONE) 20 MG tablet  2 times daily with meals     12/25/17 2035    famotidine (PEPCID) 20 MG tablet  2 times daily     12/25/17 2035           Debroah Baller Bent, [place] 12/25/17 [id number]    Daleen Bo, MD 12/29/17 1007

## 2017-12-25 NOTE — ED Triage Notes (Signed)
Pt sent from [facility] for facial swelling x 2 months and back pain since 2015. Tried all sorts of medications for her symptoms without any relief.

## 2017-12-25 NOTE — Discharge Instructions (Addendum)
Your exam today shows mild facial swelling. Your throat appears clear, without swelling. We are starting you on a course of prednisone for the swelling and rash and Pepcid. We are giving you valium for your back pain. You can continue the Benadryl as needed and the other medications your doctor has you taking. Follow up with the dermatologist and allergist as planned. Return here as needed.

## 2017-12-25 NOTE — ED Provider Notes (Signed)
  Face-to-face evaluation   History: She presents for evaluation of swelling lips and tongue,-year-old concern for allergic reaction.  She is currently taking 2 types of antihistamines.  She denies shortness of breath.  She has ongoing chronic low back pain from L3 and SI problems.  Physical exam: Alert, calm, cooperative.  No facial swelling.  No oral or swelling.  No stridor.  Lungs good air movement bilaterally without wheezes rales or rhonchi.  There is no stridor.  Medical screening examination/treatment/procedure(s) were conducted as a shared visit with non-physician practitioner(s) and myself.  I personally evaluated the patient during the encounter    Daleen Bo, MD 12/29/17 1007

## 2017-12-25 NOTE — Telephone Encounter (Signed)
Copied from [facility] [id number]. Topic: Referral - Request >> Dec 25, 2017  4:10 PM Bea Graff, NT wrote: Reason for CRM: Pt requesting a referral to a orthopedic doctor for her back pain. She would like to be referred to [facility].

## 2017-12-26 NOTE — Telephone Encounter (Signed)
Left message patient will need to be seen before a referral can be sent in

## 2018-01-01 ENCOUNTER — Ambulatory Visit: Payer: Self-pay

## 2018-01-03 ENCOUNTER — Encounter: Payer: Self-pay | Admitting: Allergy

## 2018-01-03 ENCOUNTER — Ambulatory Visit: Payer: BLUE CROSS/BLUE SHIELD | Admitting: Allergy

## 2018-01-03 VITALS — BP 94/66 | HR 84 | Resp 16

## 2018-01-03 DIAGNOSIS — H1013 Acute atopic conjunctivitis, bilateral: Secondary | ICD-10-CM | POA: Diagnosis not present

## 2018-01-03 DIAGNOSIS — J4540 Moderate persistent asthma, uncomplicated: Secondary | ICD-10-CM | POA: Diagnosis not present

## 2018-01-03 DIAGNOSIS — L2489 Irritant contact dermatitis due to other agents: Secondary | ICD-10-CM

## 2018-01-03 DIAGNOSIS — J309 Allergic rhinitis, unspecified: Secondary | ICD-10-CM

## 2018-01-03 NOTE — Progress Notes (Signed)
Follow-up Note  RE: Lindsey [name] MRN: [id number] DOB: Oct 03, 1965 Date of Office Visit: 01/03/2018   History of present illness: Lindsey [name] is a 52 y.o. female presenting today for follow-up of rhinoconjunctivitis, asthma, and food allergies.  She was last seen in the office on Sep 07 2017 by myself.  HPI obtained by Dr. Sherry Ruffing, internal medicine resident, and confirmed by myself with patient.  She reports that she is interested in getting the allergen immunotherapy going. She reports that her allergies and asthma symptoms are getting worse during the fall.   She went to the ER for an allergic reaction 1 week ago. She states that her eyes were swelling shut, and she had swelling of her lips, and tongue. She was given a 7 day course of steroids and an H2 blocker,famotidine, which helped a bit but she reports that she is still having some symptoms. She states that she still has a bit of her rash, her eyes have improved, and her lips and tongue improved as well. She reports that she changed soaps a few months ago and this is when she started to develop the facial rash so she switched it to a back to her original soap.  But she feels that she may have reacted to the new soap.  She states she is interested in patch testing as she also has rather severe reactions to poison ivy exposure.  In regards to her allergies she reports that she is still having congestion, runny nose, itchy eyes, teary eyes, post nasal drip, sneezing, and ear pressure. It does not change from being inside or outside. She is used singular, Ryvent, Xyzal, she reports that they don't help very much. She is using Restasis for her eyes and she reports that this helps. She is not using Astelin nasal spray.  She did see an ENT since her last visit and states that they advised her to do saline rinses and provided with a recipe to make the symptoms at home however she states that she would rather just by the saline packets from the  pharmacy.  In regards to her asthma she reports that it is getting worse, she reports that it is more frequent, using inhaler 1-2/ week. She reports she gets shortness of breath, coughing, chest tightness, difficult to talk, and some wheezing. Her sleep has been okay, she takes medications to sleep however she will wake up from coughing about 1-2/month. She is using albuterol, Singulair, Xyzal and Ryvent.    Review of systems: Review of Systems  Constitutional: Negative for chills, fever and malaise/fatigue.  HENT: Positive for congestion. Negative for ear discharge, nosebleeds and sore throat.   Eyes: Negative for pain, discharge and redness.  Respiratory: Positive for cough, shortness of breath and wheezing.   Cardiovascular: Negative for chest pain.  Gastrointestinal: Negative for abdominal pain, constipation, diarrhea, heartburn, nausea and vomiting.  Musculoskeletal: Negative for joint pain.  Skin: Positive for itching and rash.  Neurological: Negative for headaches.    All other systems negative unless noted above in HPI  Past medical/social/surgical/family history have been reviewed and are unchanged unless specifically indicated below.  No changes  Medication List: Allergies as of 01/03/2018      Reactions   Kiwi Extract Anaphylaxis   Poison Oak Extract [poison Oak Extract] Anaphylaxis   Penicillins Hives   Sulfa Antibiotics Nausea And Vomiting   Sulfamethoxazole-trimethoprim       Medication List        Accurate  as of 01/03/18  5:24 PM. Always use your most recent med list.          acetaminophen 500 MG tablet Commonly known as:  TYLENOL Take 1,000 mg by mouth 2 (two) times daily after a meal.   albuterol 108 (90 Base) MCG/ACT inhaler Commonly known as:  PROVENTIL HFA;VENTOLIN HFA Inhale into the lungs every 6 (six) hours as needed for wheezing or shortness of breath.   azelastine 0.1 % nasal spray Commonly known as:  ASTELIN Place 2 sprays into both nostrils  2 (two) times daily.   Carbinoxamine Maleate 6 MG Tabs Take 1 tablet by mouth daily.   cholecalciferol 1000 units tablet Commonly known as:  VITAMIN D Take 10,000 Units by mouth daily.   clindamycin 1 % gel Commonly known as:  CLINDAGEL Apply topically 2 (two) times daily. To acne areas.   DENTA 5000 PLUS 1.1 % Crea dental cream Generic drug:  sodium fluoride See admin instructions.   diazepam 5 MG tablet Commonly known as:  VALIUM Take 1 tablet (5 mg total) by mouth 2 (two) times daily.   DULoxetine 30 MG capsule Commonly known as:  CYMBALTA Take 60 mg by mouth as needed.   EPINEPHrine 0.3 mg/0.3 mL Soaj injection Commonly known as:  EPI-PEN INJECT 3MLS INTO THE MUSCLE ONCE FOR 1 DOSE   gabapentin 300 MG capsule Commonly known as:  NEURONTIN Take 1 capsule (300 mg total) by mouth at bedtime.   hyoscyamine 0.375 MG 12 hr tablet Commonly known as:  LEVBID Take 1 tablet (0.375 mg total) by mouth 2 (two) times daily.   ipratropium 0.06 % nasal spray Commonly known as:  ATROVENT Place 2 sprays into both nostrils every 12 (twelve) hours.   levocetirizine 5 MG tablet Commonly known as:  XYZAL Take 1 tablet (5 mg total) by mouth every evening.   Melatonin 10 MG Tabs Take by mouth.   montelukast 10 MG tablet Commonly known as:  SINGULAIR Take 1 tablet (10 mg total) by mouth at bedtime.   mupirocin ointment 2 % Commonly known as:  BACTROBAN Apply 1 application topically 3 (three) times daily.   PEG-KCl-NaCl-NaSulf-Na Asc-C 140 g Solr Take 1 kit by mouth as directed.   progesterone 100 MG capsule Commonly known as:  PROMETRIUM   RESTASIS 0.05 % ophthalmic emulsion Generic drug:  cycloSPORINE PUT 1 DROP IN BOTH EYES TWICE DAILY AS DIRECTED   traZODone 50 MG tablet Commonly known as:  DESYREL Take 2-3 tabs po QHS for sleep.   VITAMIN B 12 PO Take 5,000 mcg by mouth daily.   vitamin C 1000 MG tablet Take 1,200 mg by mouth daily.       Known medication  allergies: Allergies  Allergen Reactions  . Kiwi Extract Anaphylaxis  . Poison Oak Extract [Poison Oak Extract] Anaphylaxis  . Penicillins Hives  . Sulfa Antibiotics Nausea And Vomiting  . Sulfamethoxazole-Trimethoprim      Physical examination: Blood pressure 94/66, pulse 84, resp. rate 16, last menstrual period 05/09/2014, SpO2 96 %.  General: Alert, interactive, in no acute distress. HEENT: PERRLA, TMs pearly gray, turbinates mildly edematous without discharge, post-pharynx non erythematous. Neck: Supple without lymphadenopathy. Lungs: Clear to auscultation without wheezing, rhonchi or rales. {no increased work of breathing. CV: Normal S1, S2 without murmurs. Abdomen: Nondistended, nontender. Skin: Warm and dry, without lesions or rashes. Extremities:  No clubbing, cyanosis or edema.  No visible facial rash today Neuro:   Grossly intact.  Diagnositics/Labs:  Spirometry: FEV1: 2.61L  84%, FVC: 3.42L 87%, ratio consistent with Nonobstructive pattern  Assessment and plan:   Allergic rhinoconjunctivitis - continue avoidance measures for positive to weeds, trees  - continue Ryvent 108m daily - continue singulair 142mdaily - take at bedtime - for nasal drainage use Astelin 2 sprays each nostril twice a day - continue saline rinse twice a day and use prior to nasal spray use - continue current use of restasis as directed - start allergen immunotherapy today and have access to an epinephrine device  Asthma, moderate persistent  - at this time not well controlled  - have access to albuterol inhaler 2 puffs every 4-6 hours as needed for cough/wheeze/shortness of breath/chest tightness.  May use 15-20 minutes prior to activity.   Monitor frequency of use.    - continue singulair 1051maily   - Qvar 52m64medihaler 2 inhalations twice a day Provided her with samples today as she states that she has not met her deductible and cannot afford to get this from the pharmacy.  She thinks  she is pretty close to meeting her deductible and should be able to acquire from the pharmacy soon.   Asthma control goals:   Full participation in all desired activities (may need albuterol before activity)  Albuterol use two time or less a week on average (not counting use with activity)  Cough interfering with sleep two time or less a month  Oral steroids no more than once a year  No hospitalizations  Food allergy    - continue avoidance of kiwi    - have access to self-injectable epinephrine (Epipen or AuviQ) 0.3mg 9mall times    - follow emergency action plan in case of allergic reaction  Facial rash/reaction   - patch testing is an option to help determine if facial rash is triggered by contact exposure.  Patch testing involves applying TRUE test patches.  Patches are applied to the back   Follow-up 3-4 months or sooner if needed.  Make patch testing appointment for a Monday with return visits on Wed and Friday of same week for readings.    I appreciate the opportunity to take part in Sintia's care. Please do not hesitate to contact me with questions.  Sincerely,   ShaylPrudy FeelerAllergy/Immunology [facility]

## 2018-01-03 NOTE — Patient Instructions (Addendum)
Rhinoconjunctivitis - continue avoidance measures for positive to weeds, trees  - continue Ryvent 5mg  daily - continue singulair 10mg  daily - take at bedtime - for nasal drainage use Astelin 2 sprays each nostril twice a day - continue saline rinse twice a day and use prior to nasal spray use - continue current use of restasis as directed - start allergen immunotherapy today  Asthma  - at this time not well controlled  - have access to albuterol inhaler 2 puffs every 4-6 hours as needed for cough/wheeze/shortness of breath/chest tightness.  May use 15-20 minutes prior to activity.   Monitor frequency of use.    - continue singulair 10mg  daily   - Qvar 74mcg redihaler 2 inhalations twice a day  Asthma control goals:   Full participation in all desired activities (may need albuterol before activity)  Albuterol use two time or less a week on average (not counting use with activity)  Cough interfering with sleep two time or less a month  Oral steroids no more than once a year  No hospitalizations  Food allergy    - continue avoidance of kiwi    - have access to self-injectable epinephrine (Epipen or AuviQ) 0.3mg  at all times    - follow emergency action plan in case of allergic reaction  Facial rash/reaction   - patch testing is an option to help determine if facial rash is triggered by contact exposure.  Patch testing involves applying TRUE test patches.  Patches are applied to the back   True Test looks for the following sensitivities:      Follow-up 3-4 months or sooner if needed.  Make patch testing appointment for a Monday with return visits on Wed and Friday of same week for readings.

## 2018-01-09 ENCOUNTER — Ambulatory Visit (INDEPENDENT_AMBULATORY_CARE_PROVIDER_SITE_OTHER): Payer: BLUE CROSS/BLUE SHIELD

## 2018-01-09 DIAGNOSIS — J309 Allergic rhinitis, unspecified: Secondary | ICD-10-CM

## 2018-01-09 DIAGNOSIS — H1013 Acute atopic conjunctivitis, bilateral: Secondary | ICD-10-CM

## 2018-01-12 ENCOUNTER — Encounter: Payer: Self-pay | Admitting: Gastroenterology

## 2018-01-15 ENCOUNTER — Encounter: Payer: Self-pay | Admitting: Allergy & Immunology

## 2018-01-15 ENCOUNTER — Ambulatory Visit: Payer: BLUE CROSS/BLUE SHIELD | Admitting: Allergy & Immunology

## 2018-01-15 VITALS — BP 118/74 | HR 71 | Resp 16

## 2018-01-15 DIAGNOSIS — L239 Allergic contact dermatitis, unspecified cause: Secondary | ICD-10-CM

## 2018-01-15 DIAGNOSIS — J309 Allergic rhinitis, unspecified: Secondary | ICD-10-CM

## 2018-01-15 DIAGNOSIS — J301 Allergic rhinitis due to pollen: Secondary | ICD-10-CM

## 2018-01-15 DIAGNOSIS — L2489 Irritant contact dermatitis due to other agents: Secondary | ICD-10-CM | POA: Diagnosis not present

## 2018-01-15 DIAGNOSIS — J4540 Moderate persistent asthma, uncomplicated: Secondary | ICD-10-CM | POA: Diagnosis not present

## 2018-01-15 NOTE — Progress Notes (Signed)
    Follow-up Note  RE: XIMENA TODARO MRN: [id number] DOB: 1965/12/19 Date of Office Visit: 01/15/2018  Primary care provider: Shawnee Knapp, MD Referring provider: Shawnee Knapp, MD   Jerusalen returns to the office today for the patch test placement, given suspected history of contact dermatitis.  Ms. Haser has an extensive history of atopic conditions including allergic rhinoconjunctivitis, moderate persistent asthma, and food allergy.  She was last seen by Dr. Nelva Bush in September 2019.  At that time, she was endorsing a facial rash which was felt to be secondary to make-up or another environmental trigger.  In the interim, her symptoms have improved.  She ended up going to the ER because "nobody could help her".  In the ER, she was given a 5-day prednisone pack as well as an H2 blocker.  This resolved her symptoms completely.   Vitals:   01/15/18 0917  BP: 118/74  Pulse: 71  Resp: 16  SpO2: 98%    General:  alert, active, in no acute distress Head:  normocephalic, no masses, lesions, tenderness or abnormalities Eyes:  conjunctiva clear without injection or discharge, EOMI, PERL Ears:  TM's pearly white bilaterally, external auditory canals are clear, external ears are normally set and rotated Nose:  External nose within normal limits, normal appearing turbinates, clear-colored discharge, septum midline Throat:  moist mucous membranes without erythema, exudates or petechiae, no thrush Neck:  Supple without thyromegaly or adenopathy appreciated Lymphatic: no adenopathy appreciated in the cervical, posterior auricular, axillary, epitrochlear, inguinal, or popliteal regions Lungs:  clear to auscultation, no wheezing, crackles or rhonchi, breathing unlabored, moving air well in all lung fields Heart:  regular rate and rhythm, normal S1/S2, no murmurs or gallops, normal peripheral perfusion Abdomen:  Soft, non-tender, BS normal, no masses, no organomegaly Neuro:  Normal mental status, speech  normal, alert and oriented x3 Musculoskeletal:  no cyanosis, clubbing or edema Skin:  skin color, texture and turgor are normal; no bruising, rashes or lesions noted. Psych: Normal Affect and mood    Diagnostics: True Test patches placed.   Spirometry: Normal FEV1, FVC, and FEV1/FVC ratio. There is no scooping suggestive of obstructive disease.    Plan:   Allergic contact dermatitis - Instructions provided on care of the patches for the next 48 hours. Elektra Wartman was instructed to avoid showering for the next 48 hours. Fancy Dunkley will follow up in 48 hours and 96 hours for patch readings.    Salvatore Marvel, MD  [facility] [place] [facility] [place]

## 2018-01-15 NOTE — Patient Instructions (Addendum)
1. Concern for contact dermatitis -True test patches were placed today. -Come back on Wednesday and Friday for readings. -You can sponge bathe until Wednesday, but do not take any showers or baths. -You can use antihistamines as needed for itching. -Avoid topical steroids and prednisone until after the testing is completed at the end of the week.  2. No follow-ups on file.   Please inform us of any Emergency Department visits, hospitalizations, or changes in symptoms. Call us before going to the ED for breathing or allergy symptoms since we might be able to fit you in for a sick visit. Feel free to contact us anytime with any questions, problems, or concerns.  It was a pleasure to meet you today!  Websites that have reliable patient information: 1. American Academy of Asthma, Allergy, and Immunology: www.aaaai.org 2. Food Allergy Research and Education (FARE): foodallergy.org 3. Mothers of Asthmatics: http://www.asthmacommunitynetwork.org 4. American College of Allergy, Asthma, and Immunology: MonthlyElectricBill.co.uk   Make sure you are registered to vote! If you have moved or changed any of your contact information, you will need to get this updated before voting!     True Test looks for the following sensitivities:

## 2018-01-17 ENCOUNTER — Ambulatory Visit (INDEPENDENT_AMBULATORY_CARE_PROVIDER_SITE_OTHER): Payer: BLUE CROSS/BLUE SHIELD | Admitting: Allergy

## 2018-01-17 ENCOUNTER — Ambulatory Visit (INDEPENDENT_AMBULATORY_CARE_PROVIDER_SITE_OTHER): Payer: BLUE CROSS/BLUE SHIELD

## 2018-01-17 DIAGNOSIS — L2489 Irritant contact dermatitis due to other agents: Secondary | ICD-10-CM

## 2018-01-17 DIAGNOSIS — M461 Sacroiliitis, not elsewhere classified: Secondary | ICD-10-CM | POA: Diagnosis not present

## 2018-01-17 DIAGNOSIS — M545 Low back pain: Secondary | ICD-10-CM | POA: Diagnosis not present

## 2018-01-17 DIAGNOSIS — G479 Sleep disorder, unspecified: Secondary | ICD-10-CM | POA: Diagnosis not present

## 2018-01-17 DIAGNOSIS — J309 Allergic rhinitis, unspecified: Secondary | ICD-10-CM | POA: Diagnosis not present

## 2018-01-17 DIAGNOSIS — N951 Menopausal and female climacteric states: Secondary | ICD-10-CM | POA: Diagnosis not present

## 2018-01-17 DIAGNOSIS — R232 Flushing: Secondary | ICD-10-CM | POA: Diagnosis not present

## 2018-01-17 NOTE — Progress Notes (Signed)
    Follow-up Note  RE: Lindsey [name] MRN: [id number] DOB: 06/26/1965 Date of Office Visit: 01/17/2018  Primary care provider: Shawnee Knapp, MD Referring provider: Shawnee Knapp, MD   [name] returns to the office today for the initial patch test interpretation, given suspected history of contact dermatitis.    Diagnostics:  TRUE TEST 48 hour reading: 2+ reaction to #23 thimerasol; 1+ to #4 potassium dichromate, #12 cobalt dichloride, #28 gold sodium thiosulfate, #29 imidazolidinyl urea  Plan:  Allergic contact dermatitis  The patient has been provided detailed information regarding the substances she is sensitive to, as well as products containing the substances.  Meticulous avoidance of these substances is recommended. If avoidance is not possible, the use of barrier creams or lotions is recommended.  Return in 2 days for final reading  Prudy Feeler, MD [facility] [place] [facility] [place]

## 2018-01-19 ENCOUNTER — Ambulatory Visit: Payer: BLUE CROSS/BLUE SHIELD | Admitting: Allergy

## 2018-01-19 DIAGNOSIS — L2489 Irritant contact dermatitis due to other agents: Secondary | ICD-10-CM

## 2018-01-19 NOTE — Progress Notes (Signed)
    Follow-up Note  RE: Lindsey [name] MRN: [id number] DOB: [birth year] Date of Office Visit: 01/19/2018  Primary care provider: Shawnee Knapp, MD Referring provider: Shawnee Knapp, MD   Lindsey [name] returns to the office today for the initial patch test interpretation, given suspected history of contact dermatitis.    Diagnostics:  TRUE TEST 96 hour reading:   #1 nickel with 2+ reaction; # 33 bacitracin with 1+ reaction.   #12 cobalt remains 1+ reaction; #23Thimerosal remains 2+ reaction  Plan:  Allergic contact dermatitis  The patient has been provided detailed information regarding the substances she is sensitive to, as well as products containing the substances.  Meticulous avoidance of these substances is recommended. If avoidance is not possible, the use of barrier creams or lotions is recommended. If symptoms persist or progress despite meticulous avoidance of chemicals/substances above, dermatology evaluation may be warranted.  Prudy Feeler, MD [facility]

## 2018-01-25 ENCOUNTER — Ambulatory Visit (INDEPENDENT_AMBULATORY_CARE_PROVIDER_SITE_OTHER): Payer: BLUE CROSS/BLUE SHIELD | Admitting: *Deleted

## 2018-01-25 DIAGNOSIS — J309 Allergic rhinitis, unspecified: Secondary | ICD-10-CM

## 2018-02-01 ENCOUNTER — Ambulatory Visit (INDEPENDENT_AMBULATORY_CARE_PROVIDER_SITE_OTHER): Payer: BLUE CROSS/BLUE SHIELD | Admitting: *Deleted

## 2018-02-01 DIAGNOSIS — J309 Allergic rhinitis, unspecified: Secondary | ICD-10-CM

## 2018-02-05 ENCOUNTER — Other Ambulatory Visit: Payer: Self-pay | Admitting: Allergy

## 2018-02-05 DIAGNOSIS — M533 Sacrococcygeal disorders, not elsewhere classified: Secondary | ICD-10-CM | POA: Diagnosis not present

## 2018-02-06 ENCOUNTER — Telehealth: Payer: Self-pay | Admitting: *Deleted

## 2018-02-06 NOTE — Telephone Encounter (Signed)
I left her a message that I would reschedule her surgery from February 09, 2018 to April 06, 2018.  I asked her to call if she has any further questions or concerns.

## 2018-02-06 NOTE — Telephone Encounter (Signed)
"  I need to reschedule the November 1 surgery until after Christmas, so the first available after Christmas.  Please call and let me know."

## 2018-02-08 ENCOUNTER — Ambulatory Visit (INDEPENDENT_AMBULATORY_CARE_PROVIDER_SITE_OTHER): Payer: BLUE CROSS/BLUE SHIELD | Admitting: *Deleted

## 2018-02-08 DIAGNOSIS — J309 Allergic rhinitis, unspecified: Secondary | ICD-10-CM | POA: Diagnosis not present

## 2018-02-09 IMAGING — CR DG CHEST 2V
2 series · 2 of 2 positions shown · non-contrast
Comparison: None in PACs

CLINICAL DATA: Chronic chest congestion and tightness; allergies,
history of asthma.

EXAM:
CHEST  2 VIEW

[w chest pa]
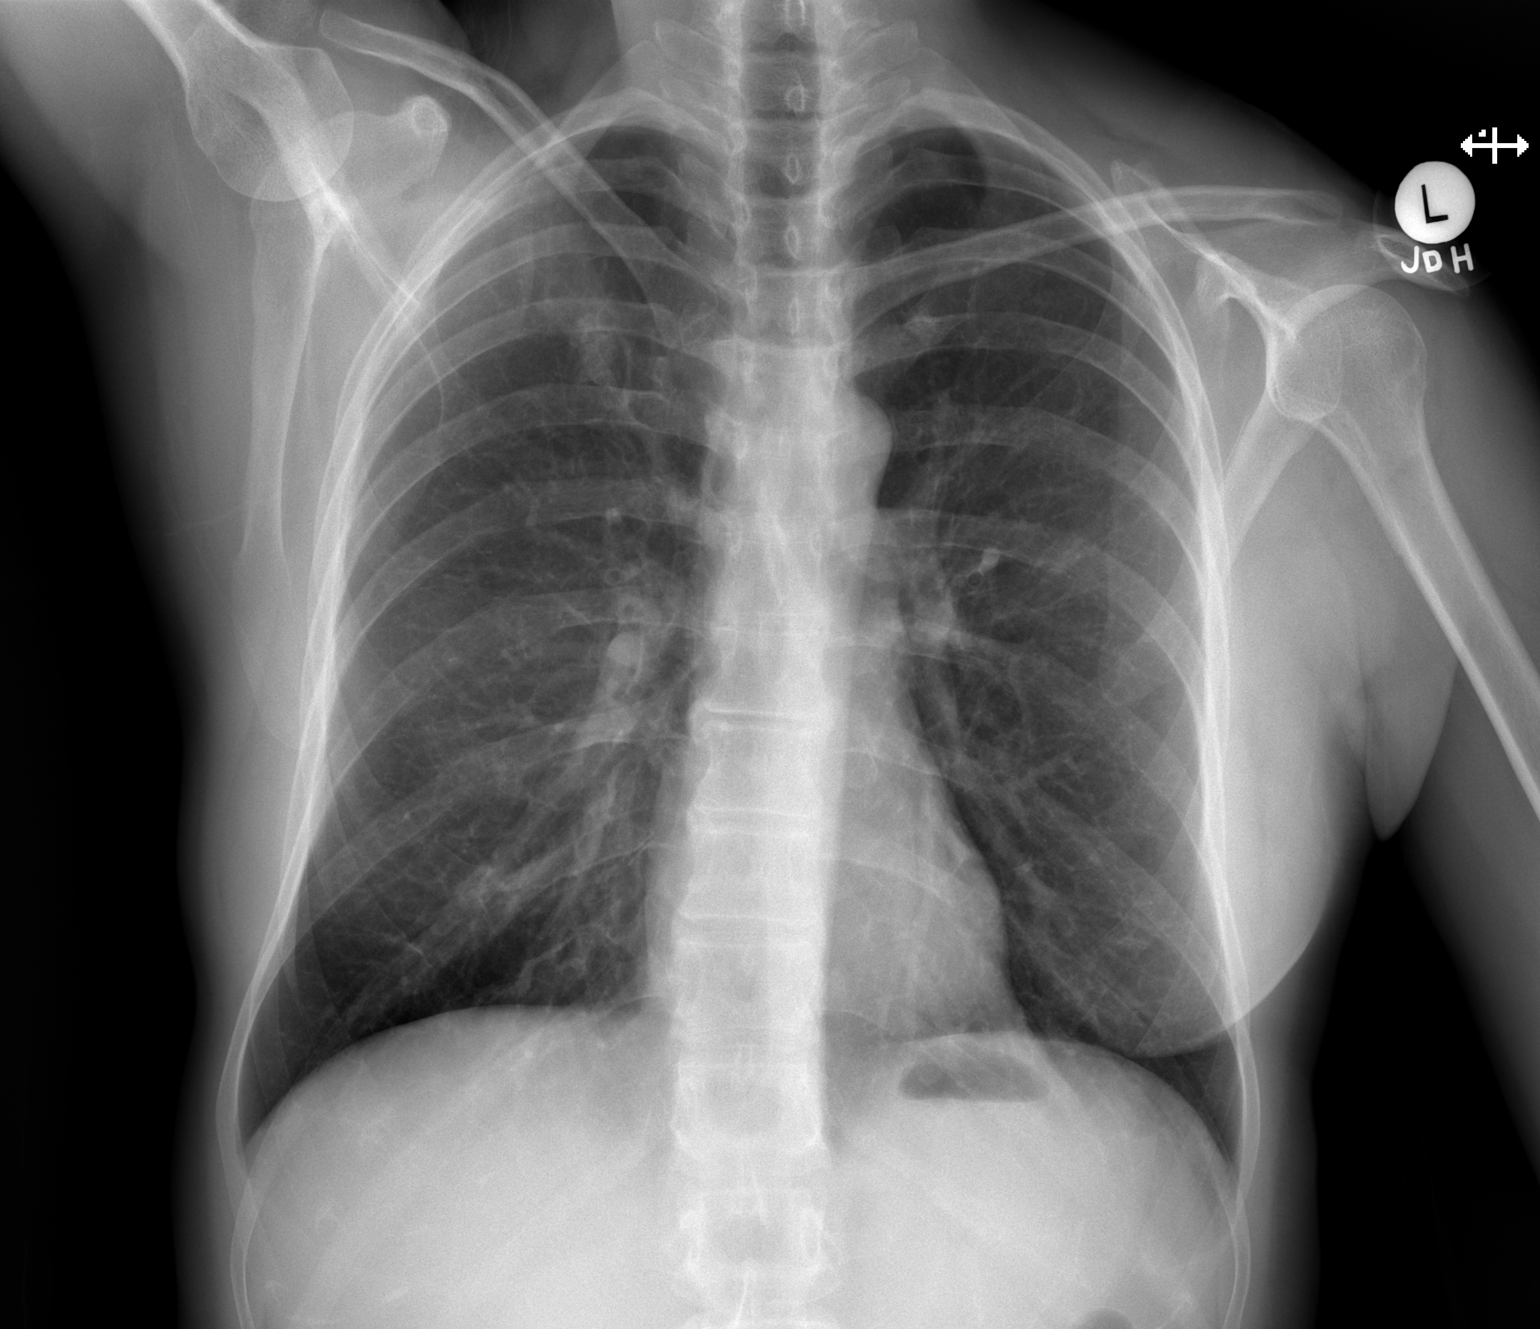

[w chest lat]
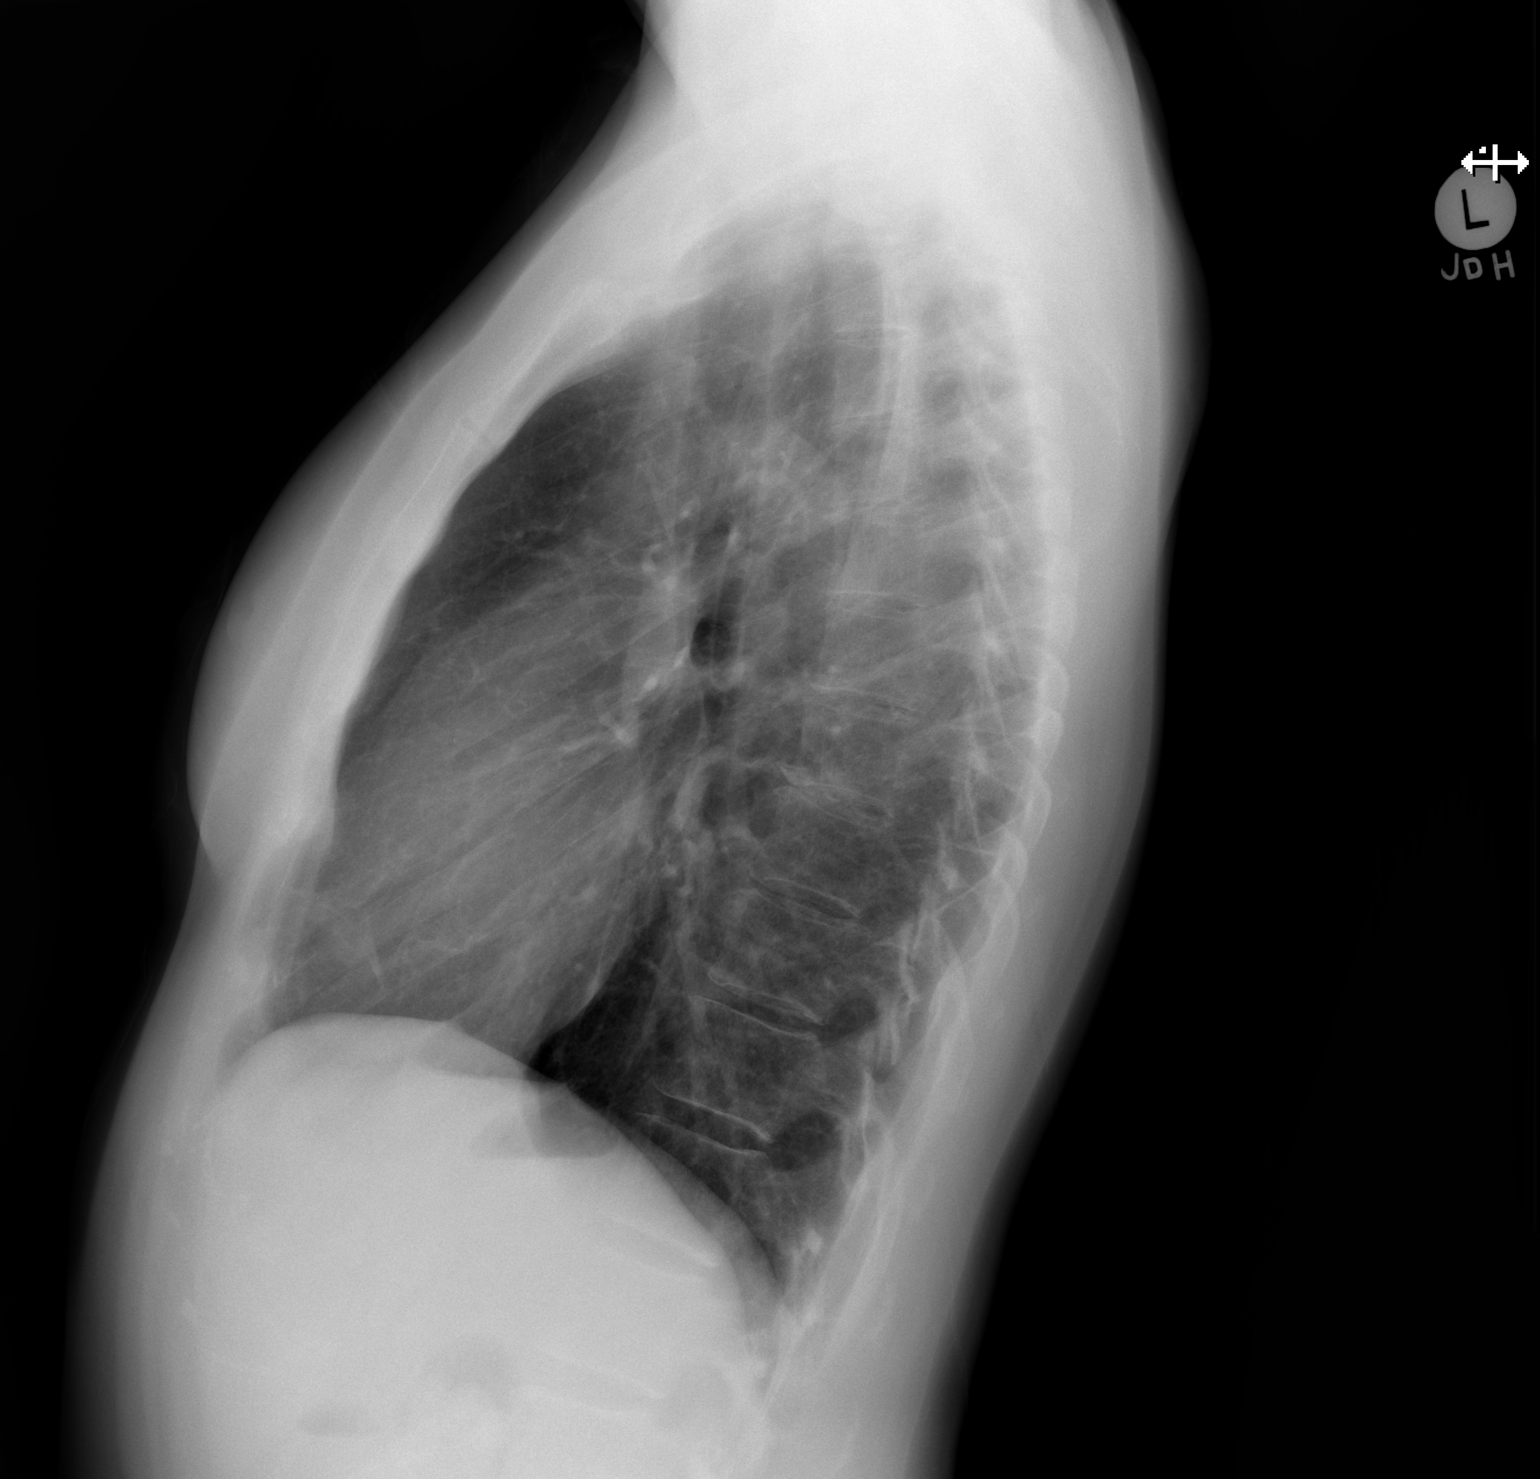

[2 of 2 positions shown; findings below may reference images not displayed]

FINDINGS: The lungs are mildly hyper inflated. There is no focal infiltrate.
There is no pleural effusion. The heart and pulmonary vascularity
are normal. The mediastinum is normal in width. The bony thorax
exhibits no acute abnormality. There is mild prominence of the right
first costosternal junction
IMPRESSION: Hyperinflation consistent with reactive airway disease. There is no
pneumonia nor other acute cardiopulmonary abnormality.

## 2018-02-12 ENCOUNTER — Encounter: Payer: Self-pay | Admitting: Cardiovascular Disease

## 2018-02-12 ENCOUNTER — Ambulatory Visit: Payer: BLUE CROSS/BLUE SHIELD | Admitting: Cardiovascular Disease

## 2018-02-12 VITALS — BP 115/71 | HR 68 | Ht 66.0 in | Wt 141.8 lb

## 2018-02-12 DIAGNOSIS — I493 Ventricular premature depolarization: Secondary | ICD-10-CM

## 2018-02-12 DIAGNOSIS — I951 Orthostatic hypotension: Secondary | ICD-10-CM | POA: Diagnosis not present

## 2018-02-12 NOTE — Patient Instructions (Signed)
Medication Instructions:  Your physician recommends that you continue on your current medications as directed. Please refer to the Current Medication list given to you today.  If you need a refill on your cardiac medications before your next appointment, please call your pharmacy.    Lab work: None Ordered   Testing/Procedures: None Ordered   Follow-Up: At [facility], you and your health needs are our priority.  As part of our continuing mission to provide you with exceptional heart care, we have created designated Provider Care Teams.  These Care Teams include your primary Cardiologist (physician) and Advanced Practice Providers (APPs -  Physician Assistants and Nurse Practitioners) who all work together to provide you with the care you need, when you need it. You will need a follow up appointment in:  3 months. You may see Mertie Moores, MD or one of the following Advanced Practice Providers on your designated Care Team: Richardson Dopp, PA-C [place], [place] . Daune Perch, NP   Increase your intake of fluids (water with electrolyte tabs like Nun tablets, or gatorade) , protein ( hard boiled eggs, chicken, fish) , and a electrolytes ( V-8 juice, salt, potassium chloride  which is sold as No-Salt

## 2018-02-12 NOTE — Progress Notes (Signed)
Cardiology Office Note:    Date:  02/12/2018   ID:  Lindsey [name], DOB 1965-11-24, MRN [id number]  PCP:  Shawnee Knapp, MD  Cardiologist:  Mertie Moores, MD  Electrophysiologist:  None   Referring MD: Marda Stalker, PA-C   Problem list 1. Asthma 2. palptations    Chief Complaint  Patient presents with  . Palpitations         [name] is a 52 y.o. female with a hx of palpitations and intermittent episodes of dizziness.  We are asked to see her today by Marda Stalker, PA for further evaluation of her palpitations and dizziness.  She has had palpitations .  She has intermittent episodes of dizziness.  She has increased dizziness when she stands up. Seems to be worse if she has not eaten .  Eats regularly .     Was found to have premature ventricular contractions / fusion beat  on an EKG.  Exercised regularly  - runs several times a week .   Hydrates well with running.  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 Allergy   . Angio-edema   . Arthritis   . Asthma   . Carpal tunnel syndrome 07/07/2015   right  . Chronic cough 12/02/2015  . Chronic pain syndrome 08/10/2016  . Ehlers-Danlos syndrome   . Essential alopecia of women 04/14/2011  . Family history of breast cancer in female 08/26/2015   (x2) paternal 1st cousins dx in their 70s   . Fibrocystic breast changes of both breasts 07/19/2015  . Fibromyalgia   . Hypermobility syndrome 11/26/2014   Based on Fam Hx and Beighton Score of 7 this is likely ED III   . Hypertrophy of vulva 07/20/2017  . Inflammatory dermatosis 04/14/2011  . Irritable larynx syndrome 12/02/2015  . Lumbar radiculopathy 02/16/2016  . Osteoarthritis 04/01/2017  . Osteopenia   . Primary arthrosis of first carpometacarpal joints, bilateral  06/01/2015  . Sacroiliac joint dysfunction of right side 11/26/2014   B SI joint xrays last done at [facility] Dr. Kathlene November 03/13/2017    Past Surgical History:  Procedure Laterality Date  . DENTAL SURGERY      Current Medications: Current Meds  Medication Sig  . acetaminophen (TYLENOL) 500 MG tablet Take 1,000 mg by mouth 2 (two) times daily after a meal.  . albuterol (PROVENTIL HFA;VENTOLIN HFA) 108 (90 Base) MCG/ACT inhaler Inhale into the lungs every 6 (six) hours as needed for wheezing or shortness of breath.  . Ascorbic Acid (VITAMIN C) 1000 MG tablet Take 1,200 mg by mouth 2 (two) times daily.   Marland Kitchen azelastine (ASTELIN) 0.1 % nasal spray Place 2 sprays into both nostrils 2 (two) times daily.  . cholecalciferol (VITAMIN D) 1000 units tablet Take 10,000 Units by mouth daily.  . clindamycin (CLINDAGEL) 1 % gel Apply topically 2 (two) times daily. To acne areas.  . Cyanocobalamin (VITAMIN B 12 PO) Take 5,000 mcg by mouth daily.  . DENTA 5000 PLUS 1.1 % CREA dental cream See admin instructions.  . DULoxetine (CYMBALTA) 30 MG capsule Take 60 mg by mouth as needed.   Marland Kitchen EPINEPHrine 0.3 mg/0.3 mL IJ SOAJ injection INJECT 3MLS INTO  THE MUSCLE ONCE FOR 1 DOSE  . gabapentin (NEURONTIN) 300 MG capsule Take 1 capsule (300 mg total) by mouth at bedtime.  . hyoscyamine (LEVBID) 0.375 MG 12 hr tablet Take 1 tablet (0.375 mg total) by mouth 2 (two) times daily.  Marland Kitchen ipratropium (ATROVENT) 0.06 % nasal spray Place 2 sprays into both nostrils every 12 (twelve) hours.  Marland Kitchen levocetirizine (XYZAL) 5 MG tablet Take 1 tablet (5 mg total) by mouth every evening.  . Melatonin 10 MG TABS Take by mouth.  . meloxicam (MOBIC) 15 MG tablet Take 15 mg by mouth daily.  . montelukast (SINGULAIR) 10 MG tablet Take 1 tablet (10 mg total) by mouth at bedtime.  . mupirocin ointment (BACTROBAN) 2 % Apply 1 application topically 3 (three) times daily.  Marland Kitchen PEG-KCl-NaCl-NaSulf-Na Asc-C (PLENVU) 140 g SOLR Take 1 kit by mouth as  directed.  . progesterone (PROMETRIUM) 100 MG capsule Take 100 mg by mouth at bedtime.   . RESTASIS 0.05 % ophthalmic emulsion PUT 1 DROP IN BOTH EYES TWICE DAILY AS DIRECTED  . RYVENT 6 MG TABS TAKE 1 TABLET BY MOUTH EVERY DAY  . traZODone (DESYREL) 50 MG tablet Take 2-3 tabs po QHS for sleep.     Allergies:   Kiwi extract; Poison oak extract [poison oak extract]; Xiidra [lifitegrast]; Penicillins; Sulfa antibiotics; and Sulfamethoxazole-trimethoprim   Social History   Socioeconomic History  . Marital status: Single    Spouse name: n/a  . Number of children: 0  . Years of education: Not on file  . Highest education level: Not on file  Occupational History  . Occupation: Education administrator    Comment: [employer]  Social Needs  . Financial resource strain: Not on file  . Food insecurity:    Worry: Not on file    Inability: Not on file  . Transportation needs:    Medical: Not on file    Non-medical: Not on file  Tobacco Use  . Smoking status: Never Smoker  . Smokeless tobacco: Never Used  Substance and Sexual Activity  . Alcohol use: No    Alcohol/week: 0.0 standard drinks  . Drug use: No  . Sexual activity: Not on file  Lifestyle  . Physical activity:    Days per week: Not on file    Minutes per session: Not on file  . Stress: Not on file  Relationships  . Social connections:    Talks on phone: Not on file    Gets together: Not on file    Attends religious service: Not on file    Active member of club or organization: Not on file    Attends meetings of clubs or organizations: Not on file    Relationship status: Not on file  Other Topics Concern  . Not on file  Social History Narrative  . Not on file     Family History: The patient's family history includes Alzheimer's disease in her maternal aunt; Angina in her paternal uncle; Breast cancer in her cousin and cousin; Colon cancer in her other; Congestive Heart Failure in her maternal grandmother;  Heart Problems in her paternal grandfather, paternal grandmother, and paternal uncle; Heart attack (age of onset: 74) in her paternal uncle; Hypertension in her father and mother; Other in her father and mother; Prostate cancer in her maternal grandfather; Skin cancer (age of onset: 57) in her father; Stomach cancer in her paternal aunt; Stroke in her father. There is no history of Colon polyps, Esophageal cancer, or Rectal  cancer.  ROS:   Please see the history of present illness.     All other systems reviewed and are negative.  EKGs/Labs/Other Studies Reviewed:    The following studies were reviewed today:   EKG:   February 12, 2018: Normal sinus rhythm at 68.  Incomplete right bundle branch block   Recent Labs: No results found for requested labs within last 8760 hours.  Recent Lipid Panel No results found for: CHOL, TRIG, HDL, CHOLHDL, VLDL, LDLCALC, LDLDIRECT  Physical Exam:    VS:  BP 115/71   Pulse 68   Ht 5' 6"  (1.676 m)   Wt 141 lb 12.8 oz (64.3 kg)   LMP 05/09/2014   SpO2 98%   BMI 22.89 kg/m     Wt Readings from Last 3 Encounters:  02/12/18 141 lb 12.8 oz (64.3 kg)  12/25/17 140 lb (63.5 kg)  12/25/17 140 lb (63.5 kg)     GEN:  Well nourished, well developed in no acute distress HEENT: Normal NECK: No JVD; No carotid bruits LYMPHATICS: No lymphadenopathy CARDIAC: RRR, no murmurs, rubs, gallops RESPIRATORY:  Clear to auscultation without rales, wheezing or rhonchi  ABDOMEN: Soft, non-tender, non-distended MUSCULOSKELETAL:  No edema; No deformity  SKIN: Warm and dry NEUROLOGIC:  Alert and oriented x 3 PSYCHIATRIC:  Normal affect   ASSESSMENT:    1. PVC (premature ventricular contraction)   2. Orthostatic hypotension    PLAN:    In order of problems listed above:  1.  PVCs:    She was noted to have an incidental PVC.  She seems to be otherwise asymptomatic.  She has occasional palpitations when she is stressed but otherwise is fine. Have some mild  hypokalemia.  She is been mildly hypokalemic in the past.  We will have her drink V8 juice each day to see if this helps.  2.  Orthostatic hypotension: I suspect that she is on depleted.  She runs several times a week.  She tries to hydrate and does eat protein for each meal.  I think that the V8 juice once a day will also help with this.  3.  Differential blood pressure in her [name]: I took her blood pressure very carefully.  She had a systolic blood pressure of 100 and her right arm and a systolic blood pressure of 98 in her left arm.  She has no evidence of ischemia.  Her pulses feel symmetric.  Her coloration of both hands is identical. Do not think that she has any significant peripheral vascular disease.  I will see her again in 3 months for follow-up visit to make sure that her symptoms have improved.    Medication Adjustments/Labs and Tests Ordered: Current medicines are reviewed at length with the patient today.  Concerns regarding medicines are outlined above.  Orders Placed This Encounter  Procedures  . EKG 12-Lead   No orders of the defined types were placed in this encounter.   Patient Instructions  Medication Instructions:  Your physician recommends that you continue on your current medications as directed. Please refer to the Current Medication list given to you today.  If you need a refill on your cardiac medications before your next appointment, please call your pharmacy.    Lab work: None Ordered   Testing/Procedures: None Ordered   Follow-Up: At [facility], you and your health needs are our priority.  As part of our continuing mission to provide you with exceptional heart care, we have created designated Provider Care Teams.  These Care Teams include your primary Cardiologist (physician) and Advanced Practice Providers (APPs -  Physician Assistants and Nurse Practitioners) who all work together to provide you with the care you need, when you need it. You will  need a follow up appointment in:  3 months. You may see Mertie Moores, MD or one of the following Advanced Practice Providers on your designated Care Team: Richardson Dopp, PA-C [place], [place] . Daune Perch, NP   Increase your intake of fluids (water with electrolyte tabs like Nun tablets, or gatorade) , protein ( hard boiled eggs, chicken, fish) , and a electrolytes ( V-8 juice, salt, potassium chloride  which is sold as No-Salt      Signed, Mertie Moores, MD  02/12/2018 11:21 AM    [place]

## 2018-02-15 ENCOUNTER — Other Ambulatory Visit: Payer: BLUE CROSS/BLUE SHIELD

## 2018-02-15 ENCOUNTER — Ambulatory Visit (INDEPENDENT_AMBULATORY_CARE_PROVIDER_SITE_OTHER): Payer: BLUE CROSS/BLUE SHIELD | Admitting: *Deleted

## 2018-02-15 DIAGNOSIS — J309 Allergic rhinitis, unspecified: Secondary | ICD-10-CM | POA: Diagnosis not present

## 2018-02-22 DIAGNOSIS — M545 Low back pain, unspecified: Secondary | ICD-10-CM | POA: Insufficient documentation

## 2018-02-23 ENCOUNTER — Ambulatory Visit (INDEPENDENT_AMBULATORY_CARE_PROVIDER_SITE_OTHER): Payer: BLUE CROSS/BLUE SHIELD | Admitting: *Deleted

## 2018-02-23 DIAGNOSIS — J309 Allergic rhinitis, unspecified: Secondary | ICD-10-CM

## 2018-03-01 ENCOUNTER — Ambulatory Visit (INDEPENDENT_AMBULATORY_CARE_PROVIDER_SITE_OTHER): Payer: BLUE CROSS/BLUE SHIELD | Admitting: *Deleted

## 2018-03-01 DIAGNOSIS — M62830 Muscle spasm of back: Secondary | ICD-10-CM | POA: Diagnosis not present

## 2018-03-01 DIAGNOSIS — Q796 Ehlers-Danlos syndrome, unspecified: Secondary | ICD-10-CM | POA: Diagnosis not present

## 2018-03-01 DIAGNOSIS — Z79899 Other long term (current) drug therapy: Secondary | ICD-10-CM | POA: Diagnosis not present

## 2018-03-01 DIAGNOSIS — J309 Allergic rhinitis, unspecified: Secondary | ICD-10-CM

## 2018-03-01 DIAGNOSIS — M546 Pain in thoracic spine: Secondary | ICD-10-CM | POA: Diagnosis not present

## 2018-03-05 DIAGNOSIS — M797 Fibromyalgia: Secondary | ICD-10-CM | POA: Diagnosis not present

## 2018-03-05 DIAGNOSIS — M545 Low back pain: Secondary | ICD-10-CM | POA: Diagnosis not present

## 2018-03-05 DIAGNOSIS — J45909 Unspecified asthma, uncomplicated: Secondary | ICD-10-CM | POA: Diagnosis not present

## 2018-03-05 DIAGNOSIS — Z91048 Other nonmedicinal substance allergy status: Secondary | ICD-10-CM | POA: Diagnosis not present

## 2018-03-05 DIAGNOSIS — M7918 Myalgia, other site: Secondary | ICD-10-CM | POA: Diagnosis not present

## 2018-03-05 DIAGNOSIS — Q796 Ehlers-Danlos syndrome, unspecified: Secondary | ICD-10-CM | POA: Diagnosis not present

## 2018-03-05 DIAGNOSIS — Z882 Allergy status to sulfonamides status: Secondary | ICD-10-CM | POA: Diagnosis not present

## 2018-03-05 DIAGNOSIS — Z87891 Personal history of nicotine dependence: Secondary | ICD-10-CM | POA: Diagnosis not present

## 2018-03-05 DIAGNOSIS — Z91018 Allergy to other foods: Secondary | ICD-10-CM | POA: Diagnosis not present

## 2018-03-05 DIAGNOSIS — M533 Sacrococcygeal disorders, not elsewhere classified: Secondary | ICD-10-CM | POA: Diagnosis not present

## 2018-03-05 DIAGNOSIS — Z888 Allergy status to other drugs, medicaments and biological substances status: Secondary | ICD-10-CM | POA: Diagnosis not present

## 2018-03-05 DIAGNOSIS — Z880 Allergy status to penicillin: Secondary | ICD-10-CM | POA: Diagnosis not present

## 2018-03-06 ENCOUNTER — Ambulatory Visit (INDEPENDENT_AMBULATORY_CARE_PROVIDER_SITE_OTHER): Payer: BLUE CROSS/BLUE SHIELD | Admitting: *Deleted

## 2018-03-06 DIAGNOSIS — J309 Allergic rhinitis, unspecified: Secondary | ICD-10-CM

## 2018-03-12 ENCOUNTER — Encounter: Payer: Self-pay | Admitting: Gastroenterology

## 2018-03-13 ENCOUNTER — Ambulatory Visit (INDEPENDENT_AMBULATORY_CARE_PROVIDER_SITE_OTHER): Payer: BLUE CROSS/BLUE SHIELD | Admitting: *Deleted

## 2018-03-13 DIAGNOSIS — N950 Postmenopausal bleeding: Secondary | ICD-10-CM | POA: Diagnosis not present

## 2018-03-13 DIAGNOSIS — J309 Allergic rhinitis, unspecified: Secondary | ICD-10-CM | POA: Diagnosis not present

## 2018-03-13 DIAGNOSIS — D509 Iron deficiency anemia, unspecified: Secondary | ICD-10-CM | POA: Diagnosis not present

## 2018-03-22 ENCOUNTER — Ambulatory Visit (INDEPENDENT_AMBULATORY_CARE_PROVIDER_SITE_OTHER): Payer: BLUE CROSS/BLUE SHIELD | Admitting: *Deleted

## 2018-03-22 DIAGNOSIS — J309 Allergic rhinitis, unspecified: Secondary | ICD-10-CM

## 2018-03-23 DIAGNOSIS — E039 Hypothyroidism, unspecified: Secondary | ICD-10-CM | POA: Diagnosis not present

## 2018-03-23 DIAGNOSIS — N951 Menopausal and female climacteric states: Secondary | ICD-10-CM | POA: Diagnosis not present

## 2018-03-23 DIAGNOSIS — Z131 Encounter for screening for diabetes mellitus: Secondary | ICD-10-CM | POA: Diagnosis not present

## 2018-03-23 DIAGNOSIS — E559 Vitamin D deficiency, unspecified: Secondary | ICD-10-CM | POA: Diagnosis not present

## 2018-03-23 DIAGNOSIS — E538 Deficiency of other specified B group vitamins: Secondary | ICD-10-CM | POA: Diagnosis not present

## 2018-03-23 DIAGNOSIS — M797 Fibromyalgia: Secondary | ICD-10-CM | POA: Diagnosis not present

## 2018-03-23 DIAGNOSIS — R5383 Other fatigue: Secondary | ICD-10-CM | POA: Diagnosis not present

## 2018-03-26 ENCOUNTER — Ambulatory Visit (INDEPENDENT_AMBULATORY_CARE_PROVIDER_SITE_OTHER): Payer: BLUE CROSS/BLUE SHIELD | Admitting: *Deleted

## 2018-03-26 DIAGNOSIS — J309 Allergic rhinitis, unspecified: Secondary | ICD-10-CM | POA: Diagnosis not present

## 2018-03-26 DIAGNOSIS — N950 Postmenopausal bleeding: Secondary | ICD-10-CM | POA: Diagnosis not present

## 2018-03-27 ENCOUNTER — Telehealth: Payer: Self-pay | Admitting: *Deleted

## 2018-03-27 NOTE — Telephone Encounter (Signed)
Patients postop appointments have been canceled.

## 2018-03-27 NOTE — Telephone Encounter (Signed)
I am calling to see if we can reschedule your surgery from December 27 to December 30.  "I am not going to do the surgery.  My insurance is getting ready to change."  So you want to cancel your surgery?  "Yes, I do."  I will cancel it.  Please give [place] a call if you need any conservative treatment.  I canceled the surgery in the surgical center's [facility] Portal.

## 2018-03-28 NOTE — Telephone Encounter (Signed)
When was she going to tell us?

## 2018-03-29 ENCOUNTER — Telehealth: Payer: Self-pay | Admitting: *Deleted

## 2018-03-29 DIAGNOSIS — L578 Other skin changes due to chronic exposure to nonionizing radiation: Secondary | ICD-10-CM | POA: Diagnosis not present

## 2018-03-29 DIAGNOSIS — L700 Acne vulgaris: Secondary | ICD-10-CM | POA: Diagnosis not present

## 2018-03-29 MED ORDER — MONTELUKAST SODIUM 10 MG PO TABS: 10.0000 mg | ORAL_TABLET | Freq: Every day | ORAL | 1 refills | Status: DC

## 2018-03-29 MED ORDER — FEXOFENADINE HCL 180 MG PO TABS: 180.0000 mg | ORAL_TABLET | Freq: Every day | ORAL | 1 refills | Status: DC

## 2018-03-29 MED ORDER — LEVOCETIRIZINE DIHYDROCHLORIDE 5 MG PO TABS: 5.0000 mg | ORAL_TABLET | Freq: Every evening | ORAL | 1 refills | Status: DC

## 2018-03-29 NOTE — Telephone Encounter (Signed)
Patient called back and stated that the Ryvent cost too much and she is having to go back to the [facility]. Please advise if it is ok to send these medications in.

## 2018-03-29 NOTE — Telephone Encounter (Signed)
Rx has been sent in. Called and left message for patient to call office in regards to this matter.

## 2018-03-29 NOTE — Telephone Encounter (Signed)
Patient called stating her pcp is not refilling her prescriptions anymore and she needs Singular10mg , allerga 180mg  every morning, xyzal 5mg  once at night in a 90 day supply and refills for a year.   [facility] [address]   Please advise [phone]

## 2018-03-29 NOTE — Telephone Encounter (Signed)
Called and left message for patient to call office in regards to this matter. According to patients AVS's she is on Ryvent, will need clarification as to if she is still taking this medication.

## 2018-03-29 NOTE — Telephone Encounter (Signed)
Yes that is ok to send in those refills.

## 2018-03-30 NOTE — Telephone Encounter (Signed)
Patient called back and was informed that medication was sent in.

## 2018-03-30 NOTE — Telephone Encounter (Signed)
Called and left message for patient to call office in regards to this matter. 

## 2018-04-03 ENCOUNTER — Ambulatory Visit (INDEPENDENT_AMBULATORY_CARE_PROVIDER_SITE_OTHER): Payer: BLUE CROSS/BLUE SHIELD | Admitting: *Deleted

## 2018-04-03 DIAGNOSIS — J309 Allergic rhinitis, unspecified: Secondary | ICD-10-CM

## 2018-04-05 DIAGNOSIS — Z5181 Encounter for therapeutic drug level monitoring: Secondary | ICD-10-CM | POA: Diagnosis not present

## 2018-04-05 DIAGNOSIS — M545 Low back pain: Secondary | ICD-10-CM | POA: Diagnosis not present

## 2018-04-05 DIAGNOSIS — M533 Sacrococcygeal disorders, not elsewhere classified: Secondary | ICD-10-CM | POA: Diagnosis not present

## 2018-04-05 DIAGNOSIS — Z79899 Other long term (current) drug therapy: Secondary | ICD-10-CM | POA: Diagnosis not present

## 2018-04-05 DIAGNOSIS — M549 Dorsalgia, unspecified: Secondary | ICD-10-CM | POA: Diagnosis not present

## 2018-04-06 ENCOUNTER — Telehealth: Payer: Self-pay

## 2018-04-06 NOTE — Telephone Encounter (Signed)
PA initiated through [email or web address] Awaiting for approval through [employer]. (Key: [id number]) [id number]

## 2018-04-09 NOTE — Telephone Encounter (Signed)
Patient switched back to take Xyzal and Allegra.

## 2018-04-12 ENCOUNTER — Other Ambulatory Visit: Payer: BLUE CROSS/BLUE SHIELD

## 2018-04-17 ENCOUNTER — Ambulatory Visit (INDEPENDENT_AMBULATORY_CARE_PROVIDER_SITE_OTHER): Payer: Self-pay | Admitting: *Deleted

## 2018-04-17 DIAGNOSIS — J309 Allergic rhinitis, unspecified: Secondary | ICD-10-CM

## 2018-04-20 ENCOUNTER — Ambulatory Visit (INDEPENDENT_AMBULATORY_CARE_PROVIDER_SITE_OTHER): Payer: Self-pay | Admitting: *Deleted

## 2018-04-20 DIAGNOSIS — J309 Allergic rhinitis, unspecified: Secondary | ICD-10-CM

## 2018-04-26 ENCOUNTER — Ambulatory Visit (INDEPENDENT_AMBULATORY_CARE_PROVIDER_SITE_OTHER): Payer: Self-pay | Admitting: *Deleted

## 2018-04-26 DIAGNOSIS — J309 Allergic rhinitis, unspecified: Secondary | ICD-10-CM

## 2018-05-03 ENCOUNTER — Ambulatory Visit (INDEPENDENT_AMBULATORY_CARE_PROVIDER_SITE_OTHER): Payer: Self-pay

## 2018-05-03 ENCOUNTER — Other Ambulatory Visit: Payer: BLUE CROSS/BLUE SHIELD

## 2018-05-03 DIAGNOSIS — J309 Allergic rhinitis, unspecified: Secondary | ICD-10-CM

## 2018-05-07 ENCOUNTER — Encounter: Payer: BLUE CROSS/BLUE SHIELD | Admitting: Gastroenterology

## 2018-05-08 ENCOUNTER — Ambulatory Visit (INDEPENDENT_AMBULATORY_CARE_PROVIDER_SITE_OTHER): Payer: Self-pay | Admitting: *Deleted

## 2018-05-08 DIAGNOSIS — J309 Allergic rhinitis, unspecified: Secondary | ICD-10-CM

## 2018-05-15 ENCOUNTER — Ambulatory Visit (INDEPENDENT_AMBULATORY_CARE_PROVIDER_SITE_OTHER): Payer: Self-pay | Admitting: *Deleted

## 2018-05-15 DIAGNOSIS — J309 Allergic rhinitis, unspecified: Secondary | ICD-10-CM

## 2018-05-17 ENCOUNTER — Other Ambulatory Visit: Payer: BLUE CROSS/BLUE SHIELD

## 2018-05-22 ENCOUNTER — Ambulatory Visit: Payer: Self-pay | Admitting: Cardiovascular Disease

## 2018-05-23 ENCOUNTER — Ambulatory Visit (INDEPENDENT_AMBULATORY_CARE_PROVIDER_SITE_OTHER): Payer: Managed Care, Other (non HMO) | Admitting: *Deleted

## 2018-05-23 DIAGNOSIS — J309 Allergic rhinitis, unspecified: Secondary | ICD-10-CM | POA: Diagnosis not present

## 2018-05-31 ENCOUNTER — Ambulatory Visit (INDEPENDENT_AMBULATORY_CARE_PROVIDER_SITE_OTHER): Payer: Managed Care, Other (non HMO)

## 2018-05-31 DIAGNOSIS — J309 Allergic rhinitis, unspecified: Secondary | ICD-10-CM | POA: Diagnosis not present

## 2018-06-07 ENCOUNTER — Ambulatory Visit (INDEPENDENT_AMBULATORY_CARE_PROVIDER_SITE_OTHER): Payer: Managed Care, Other (non HMO) | Admitting: *Deleted

## 2018-06-07 DIAGNOSIS — J309 Allergic rhinitis, unspecified: Secondary | ICD-10-CM | POA: Diagnosis not present

## 2018-06-18 ENCOUNTER — Ambulatory Visit (INDEPENDENT_AMBULATORY_CARE_PROVIDER_SITE_OTHER): Payer: Managed Care, Other (non HMO)

## 2018-06-18 DIAGNOSIS — J309 Allergic rhinitis, unspecified: Secondary | ICD-10-CM | POA: Diagnosis not present

## 2018-06-18 NOTE — Progress Notes (Signed)
EXP 06/19/19

## 2018-06-19 DIAGNOSIS — J301 Allergic rhinitis due to pollen: Secondary | ICD-10-CM

## 2018-07-02 ENCOUNTER — Ambulatory Visit (INDEPENDENT_AMBULATORY_CARE_PROVIDER_SITE_OTHER): Payer: Managed Care, Other (non HMO)

## 2018-07-02 DIAGNOSIS — J309 Allergic rhinitis, unspecified: Secondary | ICD-10-CM | POA: Diagnosis not present

## 2018-09-12 ENCOUNTER — Other Ambulatory Visit: Payer: Self-pay | Admitting: Allergy

## 2018-09-12 NOTE — Telephone Encounter (Signed)
Courtesy refill  

## 2018-10-01 ENCOUNTER — Other Ambulatory Visit: Payer: Self-pay | Admitting: *Deleted

## 2018-10-01 ENCOUNTER — Other Ambulatory Visit: Payer: Self-pay | Admitting: Obstetrics and Gynecology

## 2018-10-01 DIAGNOSIS — R928 Other abnormal and inconclusive findings on diagnostic imaging of breast: Secondary | ICD-10-CM

## 2018-10-01 MED ORDER — MONTELUKAST SODIUM 10 MG PO TABS: 10.0000 mg | ORAL_TABLET | Freq: Every day | ORAL | 0 refills | Status: DC

## 2018-10-01 NOTE — Telephone Encounter (Signed)
Courtesy refill  

## 2018-10-02 ENCOUNTER — Other Ambulatory Visit: Payer: Self-pay

## 2018-10-02 ENCOUNTER — Ambulatory Visit
Admission: RE | Admit: 2018-10-02 | Discharge: 2018-10-02 | Disposition: A | Discharge status: Home / Self Care | Payer: Managed Care, Other (non HMO) | Source: Ambulatory Visit | Attending: Obstetrics and Gynecology | Admitting: Obstetrics and Gynecology

## 2018-10-02 ENCOUNTER — Other Ambulatory Visit: Payer: Self-pay | Admitting: Obstetrics and Gynecology

## 2018-10-02 DIAGNOSIS — R928 Other abnormal and inconclusive findings on diagnostic imaging of breast: Secondary | ICD-10-CM

## 2018-10-02 DIAGNOSIS — R921 Mammographic calcification found on diagnostic imaging of breast: Secondary | ICD-10-CM

## 2018-10-04 ENCOUNTER — Ambulatory Visit
Admission: RE | Admit: 2018-10-04 | Discharge: 2018-10-04 | Disposition: A | Discharge status: Home / Self Care | Payer: Managed Care, Other (non HMO) | Source: Ambulatory Visit | Attending: Obstetrics and Gynecology | Admitting: Obstetrics and Gynecology

## 2018-10-04 DIAGNOSIS — R921 Mammographic calcification found on diagnostic imaging of breast: Secondary | ICD-10-CM

## 2018-10-23 ENCOUNTER — Other Ambulatory Visit: Payer: Self-pay | Admitting: General Surgery

## 2018-10-23 DIAGNOSIS — N6489 Other specified disorders of breast: Secondary | ICD-10-CM

## 2018-10-25 ENCOUNTER — Other Ambulatory Visit: Payer: Self-pay | Admitting: General Surgery

## 2018-10-25 DIAGNOSIS — N6489 Other specified disorders of breast: Secondary | ICD-10-CM

## 2018-11-21 ENCOUNTER — Other Ambulatory Visit: Payer: Self-pay

## 2018-11-21 ENCOUNTER — Encounter (HOSPITAL_BASED_OUTPATIENT_CLINIC_OR_DEPARTMENT_OTHER): Payer: Self-pay | Admitting: *Deleted

## 2018-11-26 ENCOUNTER — Other Ambulatory Visit (HOSPITAL_COMMUNITY)
Admission: RE | Admit: 2018-11-26 | Discharge: 2018-11-26 | Disposition: A | Discharge status: Home / Self Care | Payer: Managed Care, Other (non HMO) | Source: Ambulatory Visit | Attending: Internal Medicine | Admitting: Internal Medicine

## 2018-11-26 DIAGNOSIS — Z20828 Contact with and (suspected) exposure to other viral communicable diseases: Secondary | ICD-10-CM | POA: Diagnosis not present

## 2018-11-26 DIAGNOSIS — Z01812 Encounter for preprocedural laboratory examination: Secondary | ICD-10-CM | POA: Insufficient documentation

## 2018-11-26 LAB — SARS CORONAVIRUS 2 (TAT 6-24 HRS): SARS Coronavirus 2: NEGATIVE

## 2018-11-28 ENCOUNTER — Inpatient Hospital Stay: Admission: RE | Admit: 2018-11-28 | Payer: Managed Care, Other (non HMO) | Source: Ambulatory Visit

## 2018-11-29 ENCOUNTER — Ambulatory Visit (HOSPITAL_BASED_OUTPATIENT_CLINIC_OR_DEPARTMENT_OTHER)
Admission: RE | Admit: 2018-11-29 | Payer: Managed Care, Other (non HMO) | Source: Home / Self Care | Admitting: General Surgery

## 2018-11-29 SURGERY — RADIOACTIVE SEED GUIDED BREAST BIOPSY
Anesthesia: General | Site: Breast | Laterality: Left

## 2018-12-11 DIAGNOSIS — L700 Acne vulgaris: Secondary | ICD-10-CM | POA: Diagnosis not present

## 2018-12-23 ENCOUNTER — Other Ambulatory Visit: Payer: Self-pay | Admitting: Allergy

## 2019-01-09 DIAGNOSIS — R6882 Decreased libido: Secondary | ICD-10-CM | POA: Diagnosis not present

## 2019-01-09 DIAGNOSIS — R5383 Other fatigue: Secondary | ICD-10-CM | POA: Diagnosis not present

## 2019-01-09 DIAGNOSIS — N951 Menopausal and female climacteric states: Secondary | ICD-10-CM | POA: Diagnosis not present

## 2019-01-10 HISTORY — PX: BREAST EXCISIONAL BIOPSY: SUR124

## 2019-01-23 DIAGNOSIS — G5603 Carpal tunnel syndrome, bilateral upper limbs: Secondary | ICD-10-CM | POA: Diagnosis not present

## 2019-01-24 ENCOUNTER — Other Ambulatory Visit: Payer: Self-pay | Admitting: Allergy

## 2019-01-25 ENCOUNTER — Other Ambulatory Visit: Payer: Self-pay

## 2019-01-25 MED ORDER — FEXOFENADINE HCL 180 MG PO TABS: 180.0000 mg | ORAL_TABLET | Freq: Every day | ORAL | 0 refills | Status: DC

## 2019-01-29 ENCOUNTER — Other Ambulatory Visit: Payer: Self-pay

## 2019-01-29 ENCOUNTER — Encounter (HOSPITAL_BASED_OUTPATIENT_CLINIC_OR_DEPARTMENT_OTHER): Payer: Self-pay | Admitting: *Deleted

## 2019-02-01 ENCOUNTER — Other Ambulatory Visit (HOSPITAL_COMMUNITY)
Admission: RE | Admit: 2019-02-01 | Discharge: 2019-02-01 | Disposition: A | Discharge status: Home / Self Care | Payer: BC Managed Care – PPO | Source: Ambulatory Visit | Attending: General Surgery | Admitting: General Surgery

## 2019-02-01 DIAGNOSIS — Z01812 Encounter for preprocedural laboratory examination: Secondary | ICD-10-CM | POA: Diagnosis not present

## 2019-02-01 DIAGNOSIS — Z20828 Contact with and (suspected) exposure to other viral communicable diseases: Secondary | ICD-10-CM | POA: Insufficient documentation

## 2019-02-01 LAB — SARS CORONAVIRUS 2 (TAT 6-24 HRS): SARS Coronavirus 2: NEGATIVE

## 2019-02-04 ENCOUNTER — Ambulatory Visit
Admission: RE | Admit: 2019-02-04 | Discharge: 2019-02-04 | Disposition: A | Discharge status: Home / Self Care | Payer: BC Managed Care – PPO | Source: Ambulatory Visit | Attending: General Surgery | Admitting: General Surgery

## 2019-02-04 ENCOUNTER — Other Ambulatory Visit: Payer: Self-pay | Admitting: General Surgery

## 2019-02-04 ENCOUNTER — Other Ambulatory Visit: Payer: Self-pay

## 2019-02-04 DIAGNOSIS — N6489 Other specified disorders of breast: Secondary | ICD-10-CM

## 2019-02-04 DIAGNOSIS — R921 Mammographic calcification found on diagnostic imaging of breast: Secondary | ICD-10-CM

## 2019-02-04 NOTE — Progress Notes (Signed)
      Enhanced Recovery after Surgery  Enhanced Recovery after Surgery is a protocol used to improve the stress on your body and your recovery after surgery.  Patient Instructions  . The night before surgery:  o No food after midnight. ONLY clear liquids after midnight  . The day of surgery (if you do NOT have diabetes):  o Drink ONE (1) Pre-Surgery Clear Ensure as directed.   o This drink was given to you during your hospital  pre-op appointment visit. o The pre-op nurse will instruct you on the time to drink the  Pre-Surgery Ensure depending on your surgery time. o Finish the drink at the designated time by the pre-op nurse.  o Nothing else to drink after completing the  Pre-Surgery Clear Ensure.  . The day of surgery (if you have diabetes): o Drink ONE (1) Gatorade 2 (G2) as directed. o This drink was given to you during your hospital  pre-op appointment visit.  o The pre-op nurse will instruct you on the time to drink the   Gatorade 2 (G2) depending on your surgery time. o Color of the Gatorade may vary. Red is not allowed. o Nothing else to drink after completing the  Gatorade 2 (G2).         If you have questions, please contact your surgeon's office.  Surgical soap also given to patient with instructions for use.  Patient verbalized understanding of instructions. 

## 2019-02-05 ENCOUNTER — Ambulatory Visit (HOSPITAL_BASED_OUTPATIENT_CLINIC_OR_DEPARTMENT_OTHER): Payer: BC Managed Care – PPO | Admitting: Certified Registered"

## 2019-02-05 ENCOUNTER — Encounter (HOSPITAL_BASED_OUTPATIENT_CLINIC_OR_DEPARTMENT_OTHER)
Admission: RE | Disposition: A | Discharge status: Home / Self Care | Payer: Self-pay | Source: Home / Self Care | Attending: General Surgery

## 2019-02-05 ENCOUNTER — Encounter (HOSPITAL_BASED_OUTPATIENT_CLINIC_OR_DEPARTMENT_OTHER): Payer: Self-pay | Admitting: Certified Registered"

## 2019-02-05 ENCOUNTER — Ambulatory Visit (HOSPITAL_BASED_OUTPATIENT_CLINIC_OR_DEPARTMENT_OTHER)
Admission: RE | Admit: 2019-02-05 | Discharge: 2019-02-05 | Disposition: A | Discharge status: Home / Self Care | Payer: BC Managed Care – PPO | Attending: General Surgery | Admitting: General Surgery

## 2019-02-05 ENCOUNTER — Ambulatory Visit
Admission: RE | Admit: 2019-02-05 | Discharge: 2019-02-05 | Disposition: A | Discharge status: Home / Self Care | Payer: Managed Care, Other (non HMO) | Source: Ambulatory Visit | Attending: General Surgery | Admitting: General Surgery

## 2019-02-05 DIAGNOSIS — N6012 Diffuse cystic mastopathy of left breast: Secondary | ICD-10-CM | POA: Diagnosis not present

## 2019-02-05 DIAGNOSIS — N6489 Other specified disorders of breast: Secondary | ICD-10-CM

## 2019-02-05 DIAGNOSIS — N6082 Other benign mammary dysplasias of left breast: Secondary | ICD-10-CM | POA: Diagnosis not present

## 2019-02-05 DIAGNOSIS — M1990 Unspecified osteoarthritis, unspecified site: Secondary | ICD-10-CM | POA: Diagnosis not present

## 2019-02-05 DIAGNOSIS — N6092 Unspecified benign mammary dysplasia of left breast: Secondary | ICD-10-CM | POA: Diagnosis not present

## 2019-02-05 DIAGNOSIS — J45909 Unspecified asthma, uncomplicated: Secondary | ICD-10-CM | POA: Diagnosis not present

## 2019-02-05 DIAGNOSIS — R921 Mammographic calcification found on diagnostic imaging of breast: Secondary | ICD-10-CM | POA: Diagnosis not present

## 2019-02-05 DIAGNOSIS — D4862 Neoplasm of uncertain behavior of left breast: Secondary | ICD-10-CM | POA: Diagnosis not present

## 2019-02-05 HISTORY — PX: RADIOACTIVE SEED GUIDED EXCISIONAL BREAST BIOPSY: SHX6490

## 2019-02-05 LAB — POCT PREGNANCY, URINE: Preg Test, Ur: NEGATIVE

## 2019-02-05 SURGERY — RADIOACTIVE SEED GUIDED BREAST BIOPSY
Anesthesia: General | Site: Breast | Laterality: Left

## 2019-02-05 MED ORDER — FENTANYL CITRATE (PF) 100 MCG/2ML IJ SOLN
INTRAMUSCULAR | Status: AC
  Filled 2019-02-05: qty 2

## 2019-02-05 MED ORDER — BUPIVACAINE HCL (PF) 0.25 % IJ SOLN
INTRAMUSCULAR | Status: DC | PRN
  Administered 2019-02-05: 10 mL

## 2019-02-05 MED ORDER — PROPOFOL 10 MG/ML IV BOLUS
INTRAVENOUS | Status: DC | PRN
  Administered 2019-02-05: 150 mg via INTRAVENOUS

## 2019-02-05 MED ORDER — MIDAZOLAM HCL 2 MG/2ML IJ SOLN
1.0000 mg | INTRAMUSCULAR | Status: DC | PRN
  Administered 2019-02-05: 2 mg via INTRAVENOUS

## 2019-02-05 MED ORDER — LIDOCAINE HCL (CARDIAC) PF 100 MG/5ML IV SOSY
PREFILLED_SYRINGE | INTRAVENOUS | Status: DC | PRN
  Administered 2019-02-05: 60 mg via INTRAVENOUS

## 2019-02-05 MED ORDER — LACTATED RINGERS IV SOLN
INTRAVENOUS | Status: DC
  Administered 2019-02-05: 08:00:00 via INTRAVENOUS

## 2019-02-05 MED ORDER — CIPROFLOXACIN IN D5W 400 MG/200ML IV SOLN
400.0000 mg | INTRAVENOUS | Status: AC
  Administered 2019-02-05: 400 mg via INTRAVENOUS

## 2019-02-05 MED ORDER — KETOROLAC TROMETHAMINE 15 MG/ML IJ SOLN
INTRAMUSCULAR | Status: AC
  Filled 2019-02-05: qty 1

## 2019-02-05 MED ORDER — KETOROLAC TROMETHAMINE 15 MG/ML IJ SOLN: 15.0000 mg | INTRAMUSCULAR | Status: DC

## 2019-02-05 MED ORDER — ENSURE PRE-SURGERY PO LIQD: 296.0000 mL | Freq: Once | ORAL | Status: DC

## 2019-02-05 MED ORDER — ACETAMINOPHEN 500 MG PO TABS
ORAL_TABLET | ORAL | Status: AC
  Filled 2019-02-05: qty 2

## 2019-02-05 MED ORDER — GABAPENTIN 100 MG PO CAPS
ORAL_CAPSULE | ORAL | Status: AC
  Filled 2019-02-05: qty 1

## 2019-02-05 MED ORDER — ACETAMINOPHEN 500 MG PO TABS
1000.0000 mg | ORAL_TABLET | ORAL | Status: AC
  Administered 2019-02-05: 1000 mg via ORAL

## 2019-02-05 MED ORDER — ONDANSETRON HCL 4 MG/2ML IJ SOLN
INTRAMUSCULAR | Status: DC | PRN
  Administered 2019-02-05: 4 mg via INTRAVENOUS

## 2019-02-05 MED ORDER — DEXAMETHASONE SODIUM PHOSPHATE 4 MG/ML IJ SOLN
INTRAMUSCULAR | Status: DC | PRN
  Administered 2019-02-05: 10 mg via INTRAVENOUS

## 2019-02-05 MED ORDER — PROPOFOL 500 MG/50ML IV EMUL
INTRAVENOUS | Status: DC | PRN
  Administered 2019-02-05: 25 ug/kg/min via INTRAVENOUS

## 2019-02-05 MED ORDER — MIDAZOLAM HCL 2 MG/2ML IJ SOLN
INTRAMUSCULAR | Status: AC
  Filled 2019-02-05: qty 2

## 2019-02-05 MED ORDER — CIPROFLOXACIN IN D5W 400 MG/200ML IV SOLN
INTRAVENOUS | Status: AC
  Filled 2019-02-05: qty 200

## 2019-02-05 MED ORDER — GABAPENTIN 100 MG PO CAPS: 100.0000 mg | ORAL_CAPSULE | ORAL | Status: DC

## 2019-02-05 MED ORDER — FENTANYL CITRATE (PF) 100 MCG/2ML IJ SOLN
50.0000 ug | INTRAMUSCULAR | Status: DC | PRN
  Administered 2019-02-05: 100 ug via INTRAVENOUS

## 2019-02-05 SURGICAL SUPPLY — 58 items
APPLIER CLIP 9.375 MED OPEN (MISCELLANEOUS)
BINDER BREAST LRG (GAUZE/BANDAGES/DRESSINGS) IMPLANT
BINDER BREAST MEDIUM (GAUZE/BANDAGES/DRESSINGS) ×1 IMPLANT
BINDER BREAST XLRG (GAUZE/BANDAGES/DRESSINGS) IMPLANT
BINDER BREAST XXLRG (GAUZE/BANDAGES/DRESSINGS) IMPLANT
BLADE SURG 15 STRL LF DISP TIS (BLADE) ×1 IMPLANT
BLADE SURG 15 STRL SS (BLADE) ×1
CANISTER SUC SOCK COL 7IN (MISCELLANEOUS) IMPLANT
CANISTER SUCT 1200ML W/VALVE (MISCELLANEOUS) IMPLANT
CHLORAPREP W/TINT 26 (MISCELLANEOUS) ×2 IMPLANT
CLIP APPLIE 9.375 MED OPEN (MISCELLANEOUS) IMPLANT
CLIP VESOCCLUDE SM WIDE 6/CT (CLIP) IMPLANT
COVER BACK TABLE REUSABLE LG (DRAPES) ×2 IMPLANT
COVER MAYO STAND REUSABLE (DRAPES) ×2 IMPLANT
COVER PROBE W GEL 5X96 (DRAPES) ×2 IMPLANT
COVER WAND RF STERILE (DRAPES) IMPLANT
DECANTER SPIKE VIAL GLASS SM (MISCELLANEOUS) IMPLANT
DERMABOND ADVANCED (GAUZE/BANDAGES/DRESSINGS) ×1
DERMABOND ADVANCED .7 DNX12 (GAUZE/BANDAGES/DRESSINGS) ×1 IMPLANT
DRAPE LAPAROSCOPIC ABDOMINAL (DRAPES) ×2 IMPLANT
DRAPE UTILITY XL STRL (DRAPES) ×2 IMPLANT
DRSG TEGADERM 4X4.75 (GAUZE/BANDAGES/DRESSINGS) IMPLANT
ELECT COATED BLADE 2.86 ST (ELECTRODE) ×2 IMPLANT
ELECT REM PT RETURN 9FT ADLT (ELECTROSURGICAL) ×2
ELECTRODE REM PT RTRN 9FT ADLT (ELECTROSURGICAL) ×1 IMPLANT
GAUZE SPONGE 4X4 12PLY STRL LF (GAUZE/BANDAGES/DRESSINGS) IMPLANT
GLOVE BIO SURGEON STRL SZ 6.5 (GLOVE) ×1 IMPLANT
GLOVE BIO SURGEON STRL SZ7 (GLOVE) ×4 IMPLANT
GLOVE BIOGEL PI IND STRL 7.0 (GLOVE) IMPLANT
GLOVE BIOGEL PI IND STRL 7.5 (GLOVE) ×1 IMPLANT
GLOVE BIOGEL PI INDICATOR 7.0 (GLOVE) ×2
GLOVE BIOGEL PI INDICATOR 7.5 (GLOVE) ×1
GOWN STRL REUS W/ TWL LRG LVL3 (GOWN DISPOSABLE) ×2 IMPLANT
GOWN STRL REUS W/TWL LRG LVL3 (GOWN DISPOSABLE) ×2
HEMOSTAT ARISTA ABSORB 3G PWDR (HEMOSTASIS) IMPLANT
ILLUMINATOR WAVEGUIDE N/F (MISCELLANEOUS) IMPLANT
KIT MARKER MARGIN INK (KITS) ×2 IMPLANT
LIGHT WAVEGUIDE WIDE FLAT (MISCELLANEOUS) IMPLANT
NDL HYPO 25X1 1.5 SAFETY (NEEDLE) ×1 IMPLANT
NEEDLE HYPO 25X1 1.5 SAFETY (NEEDLE) ×2 IMPLANT
NS IRRIG 1000ML POUR BTL (IV SOLUTION) ×1 IMPLANT
PACK BASIN DAY SURGERY FS (CUSTOM PROCEDURE TRAY) ×2 IMPLANT
PENCIL BUTTON HOLSTER BLD 10FT (ELECTRODE) ×1 IMPLANT
SLEEVE SCD COMPRESS KNEE MED (MISCELLANEOUS) ×2 IMPLANT
SPONGE LAP 4X18 RFD (DISPOSABLE) ×2 IMPLANT
STRIP CLOSURE SKIN 1/2X4 (GAUZE/BANDAGES/DRESSINGS) ×2 IMPLANT
SUT MNCRL AB 4-0 PS2 18 (SUTURE) IMPLANT
SUT MON AB 5-0 PS2 18 (SUTURE) ×1 IMPLANT
SUT SILK 2 0 SH (SUTURE) IMPLANT
SUT VIC AB 2-0 SH 27 (SUTURE) ×1
SUT VIC AB 2-0 SH 27XBRD (SUTURE) ×1 IMPLANT
SUT VIC AB 3-0 SH 27 (SUTURE) ×1
SUT VIC AB 3-0 SH 27X BRD (SUTURE) ×1 IMPLANT
SYR CONTROL 10ML LL (SYRINGE) ×2 IMPLANT
TOWEL GREEN STERILE FF (TOWEL DISPOSABLE) ×2 IMPLANT
TRAY FAXITRON CT DISP (TRAY / TRAY PROCEDURE) ×2 IMPLANT
TUBE CONNECTING 20X1/4 (TUBING) IMPLANT
YANKAUER SUCT BULB TIP NO VENT (SUCTIONS) IMPLANT

## 2019-02-05 NOTE — Interval H&P Note (Signed)
History and Physical Interval Note:  02/05/2019 8:37 AM  Lindsey [name]  has presented today for surgery, with the diagnosis of left breat calcification.  The various methods of treatment have been discussed with the patient and family. After consideration of risks, benefits and other options for treatment, the patient has consented to  Procedure(s): RADIOACTIVE SEED GUIDED EXCISIONAL LEFT BREAST BIOPSY (Left) as a surgical intervention.  The patient's history has been reviewed, patient examined, no change in status, stable for surgery.  I have reviewed the patient's chart and labs.  Questions were answered to the patient's satisfaction.     Rolm Bookbinder

## 2019-02-05 NOTE — Anesthesia Procedure Notes (Addendum)
Procedure Name: LMA Insertion Date/Time: 02/05/2019 8:52 AM Performed by: Signe Colt, CRNA Pre-anesthesia Checklist: Patient identified, Emergency Drugs available, Suction available and Patient being monitored Patient Re-evaluated:Patient Re-evaluated prior to induction Oxygen Delivery Method: Circle system utilized Preoxygenation: Pre-oxygenation with 100% oxygen Induction Type: IV induction Ventilation: Mask ventilation without difficulty LMA: LMA inserted LMA Size: 4.0 Number of attempts: 1 Airway Equipment and Method: Bite block Placement Confirmation: positive ETCO2 Tube secured with: Tape Dental Injury: Teeth and Oropharynx as per pre-operative assessment

## 2019-02-05 NOTE — Anesthesia Postprocedure Evaluation (Signed)
Anesthesia Post Note  Patient: Lindsey [name]  Procedure(s) Performed: RADIOACTIVE SEED GUIDED EXCISIONAL LEFT BREAST BIOPSY (Left Breast)     Patient location during evaluation: PACU Anesthesia Type: General Level of consciousness: awake Pain management: pain level controlled Vital Signs Assessment: post-procedure vital signs reviewed and stable Respiratory status: spontaneous breathing Cardiovascular status: stable Postop Assessment: no apparent nausea or vomiting Anesthetic complications: no    Last Vitals:  Vitals:   02/05/19 1009 02/05/19 1015  BP:    Pulse: (!) 58 65  Resp: 12 18  Temp:  36.7 C  SpO2: 98% 100%    Last Pain:  Vitals:   02/05/19 1015  PainSc: 0-No pain                 Deboraha Goar

## 2019-02-05 NOTE — Transfer of Care (Signed)
Immediate Anesthesia Transfer of Care Note  Patient: Lindsey [name]  Procedure(s) Performed: RADIOACTIVE SEED GUIDED EXCISIONAL LEFT BREAST BIOPSY (Left Breast)  Patient Location: PACU  Anesthesia Type:General  Level of Consciousness: awake, alert , oriented and patient cooperative  Airway & Oxygen Therapy: Patient Spontanous Breathing and Patient connected to face mask oxygen  Post-op Assessment: Report given to RN and Post -op Vital signs reviewed and stable  Post vital signs: Reviewed and stable  Last Vitals:  Vitals Value Taken Time  BP    Temp    Pulse    Resp    SpO2      Last Pain: There were no vitals filed for this visit.       Complications: No apparent anesthesia complications

## 2019-02-05 NOTE — Anesthesia Preprocedure Evaluation (Addendum)
Anesthesia Evaluation  Patient identified by MRN, date of birth, ID band Patient awake    Reviewed: Allergy & Precautions, NPO status , Patient's Chart, lab work & pertinent test results  Airway Mallampati: II  TM Distance: >3 FB     Dental   Pulmonary    breath sounds clear to auscultation       Cardiovascular negative cardio ROS   Rhythm:Regular Rate:Normal     Neuro/Psych    GI/Hepatic negative GI ROS, Neg liver ROS,   Endo/Other  negative endocrine ROS  Renal/GU negative Renal ROS     Musculoskeletal   Abdominal   Peds  Hematology   Anesthesia Other Findings   Reproductive/Obstetrics                             Anesthesia Physical Anesthesia Plan  ASA: II  Anesthesia Plan: General   Post-op Pain Management:    Induction: Intravenous  PONV Risk Score and Plan: Ondansetron, Dexamethasone and Midazolam  Airway Management Planned: LMA  Additional Equipment:   Intra-op Plan:   Post-operative Plan: Extubation in OR  Informed Consent:     Dental advisory given  Plan Discussed with: CRNA and Anesthesiologist  Anesthesia Plan Comments:         Anesthesia Quick Evaluation

## 2019-02-05 NOTE — Anesthesia Procedure Notes (Signed)
Performed by: Rishon Thilges D, CRNA       

## 2019-02-05 NOTE — H&P (Signed)
49 yof referred by Dr Julien Girt for left breast calcs. she is in high risk screening. she has no fh, apparently negative genetic panel testing and no personal history. she has d density breast tissue. had no mass or dc. she underwent screening mm that showed left breast calcs that were 4 mm on dx views. biopsy was csl and she was referred for evaluation.   Past Surgical History Breast Biopsy  Left. Oral Surgery   Diagnostic Studies History  Colonoscopy  never Mammogram  within last year Pap Smear  1-5 years ago  Medication History  Medications Reconciled  Social History  Caffeine use  Coffee, Tea. No alcohol use  No drug use  Tobacco use  Never smoker.  Family History Alcohol Abuse  Father. Arthritis  Father, Mother. Hypertension  Father, Mother.  Pregnancy / Birth History  Age at menarche  89 years. Age of menopause  <45 24-50 Contraceptive History  Oral contraceptives. Gravida  0 1 Irregular periods  Para  0  Other Problems Arthritis  Asthma  Back Pain  Hemorrhoids  Hypercholesterolemia  Thyroid Disease   Review of Systems  General Present- Fatigue and Weight Gain. Not Present- Appetite Loss, Chills, Fever, Night Sweats and Weight Loss. Skin Not Present- Change in Wart/Mole, Dryness, Hives, Jaundice, New Lesions, Non-Healing Wounds, Rash and Ulcer. HEENT Present- Seasonal Allergies and Wears glasses/contact lenses. Not Present- Earache, Hearing Loss, Hoarseness, Nose Bleed, Oral Ulcers, Ringing in the Ears, Sinus Pain, Sore Throat, Visual Disturbances and Yellow Eyes. Respiratory Not Present- Bloody sputum, Chronic Cough, Difficulty Breathing, Snoring and Wheezing. Breast Present- Breast Pain and Nipple Discharge. Not Present- Breast Mass and Skin Changes. Cardiovascular Not Present- Chest Pain, Difficulty Breathing Lying Down, Leg Cramps, Palpitations, Rapid Heart Rate, Shortness of Breath and Swelling of Extremities. Gastrointestinal  Present- Bloating, Constipation, Excessive gas, Hemorrhoids and Rectal Pain. Not Present- Abdominal Pain, Bloody Stool, Change in Bowel Habits, Chronic diarrhea, Difficulty Swallowing, Gets full quickly at meals, Indigestion, Nausea and Vomiting. Female Genitourinary Not Present- Frequency, Nocturia, Painful Urination, Pelvic Pain and Urgency. Musculoskeletal Present- Back Pain, Joint Pain, Joint Stiffness, Muscle Pain and Muscle Weakness. Not Present- Swelling of Extremities. Neurological Not Present- Decreased Memory, Fainting, Headaches, Numbness, Seizures, Tingling, Tremor, Trouble walking and Weakness. Psychiatric Not Present- Anxiety, Bipolar, Change in Sleep Pattern, Depression, Fearful and Frequent crying. Endocrine Present- Cold Intolerance, Excessive Hunger, Hair Changes and Heat Intolerance. Not Present- Hot flashes and New Diabetes. Hematology Not Present- Blood Thinners, Easy Bruising, Excessive bleeding, Gland problems, HIV and Persistent Infections.    Physical Exam  General Mental Status-Alert. Orientation-Oriented X3. Head and Neck Trachea-midline. Thyroid Gland Characteristics - normal size and consistency. Eye Sclera/Conjunctiva - Bilateral-No scleral icterus. Chest and Lung Exam Chest and lung exam reveals -quiet, even and easy respiratory effort with no use of accessory muscles. Breast Nipples-No Discharge. Breast - Bilateral-Symmetric, Non Tender. Breast Lump-No Palpable Breast Mass. Cardiovascular Cardiovascular examination reveals -normal heart sounds, regular rate and rhythm with no murmurs. Neurologic Neurologic evaluation reveals -alert and oriented x 3 with no impairment of recent or remote memory. Lymphatic Head & Neck General Head & Neck Lymphatics: Bilateral - Description - Normal. Axillary General Axillary Region: Bilateral - Description - Normal. Note: no [place] [employer] adenopathy  Assessment & Plan  RADIAL SCAR OF LEFT BREAST  (L90.5) Story: Left breast seed guided excisional biopsy discussed pathology and lesion. discussed 6 month observation vs surgery. I would be fine with observation but she would like to proceed with excision. discussed local data with  15% upgrade with csl on mm that shows either atypia or less likely carcinoma. discussed surgery, seed placement, covid testing preop and recovery as well as risks. will proceed

## 2019-02-05 NOTE — Op Note (Signed)
Preoperative diagnosis:Left breast CSL on core biopsy Postoperative diagnosis: same as above Procedure:left breast seed guided excisional biopsy Surgeon: Dr Serita Grammes Anesthesia: general EBL: minimal Specimens: Left breast tissue marked with paint, additional lateral tissue marked short superior, long lateral, double deep Complications none Drains none Sponge and needle count correct dispo to recovery stable  Indications:53 yof with left sided mammographic abnormality. This underwent biopsy and is a CSL.  We discussed options and we decided to do a seed guided excision.  Procedure: After informed consent obtained patient was taken to the OR. She was given antibiotics and SCDs were in place. She was placed under general anesthesia without complication. She was prepped and draped in the standard sterile surgical fashion. Timeout was performed.   Ilocated the seed on the left side.I infiltrated marcaine and made a curvilinear incision overlying the tumor in order to hide the scar later.I then used the neoprobe to remove the seed and the surrounding tissue. The tissue was very hard and the seed was on the margin of this dense tissue and some fat.  I removed it separately when I entered that cavity.  The clip was not on mammogram.  I did remove some lateral tissue where the lesion appeared to be. Hemostasis was observed.  I felt like the area that needed to be removed and was marked with seed was removed.  Hemostasis was observedThe tissue was closed with 2-0 vicryl. The skin was closed with 3-0 vicryl and 5-0 monocryl. Glue and steristrips were applied

## 2019-02-05 NOTE — Discharge Instructions (Signed)
[place] Office Phone Number [phone]  BREAST BIOPSY/ PARTIAL MASTECTOMY: POST OP INSTRUCTIONS Take 400 mg of ibuprofen every 8 hours or 650 mg tylenol every 6 hours for next 72 hours then as needed. Use ice several times daily also. Always review your discharge instruction sheet given to you by the facility where your surgery was performed.  IF YOU HAVE DISABILITY OR FAMILY LEAVE FORMS, YOU MUST BRING THEM TO THE OFFICE FOR PROCESSING.  DO NOT GIVE THEM TO YOUR DOCTOR.  1. A prescription for pain medication may be given to you upon discharge.  Take your pain medication as prescribed, if needed.  If narcotic pain medicine is not needed, then you may take acetaminophen (Tylenol), naprosyn (Alleve) or ibuprofen (Advil) as needed. 2. Take your usually prescribed medications unless otherwise directed 3. If you need a refill on your pain medication, please contact your pharmacy.  They will contact our office to request authorization.  Prescriptions will not be filled after 5pm or on week-ends. 4. You should eat very light the first 24 hours after surgery, such as soup, crackers, pudding, etc.  Resume your normal diet the day after surgery. 5. Most patients will experience some swelling and bruising in the breast.  Ice packs and a good support bra will help.  Wear the breast binder provided or a sports bra for 72 hours day and night.  After that wear a sports bra during the day until you return to the office. Swelling and bruising can take several days to resolve.  6. It is common to experience some constipation if taking pain medication after surgery.  Increasing fluid intake and taking a stool softener will usually help or prevent this problem from occurring.  A mild laxative (Milk of Magnesia or Miralax) should be taken according to package directions if there are no bowel movements after 48 hours. 7. Unless discharge instructions indicate otherwise, you may remove your bandages 48  hours after surgery and you may shower at that time.  You may have steri-strips (small skin tapes) in place directly over the incision.  These strips should be left on the skin for 7-10 days and will come off on their own.  If your surgeon used skin glue on the incision, you may shower in 24 hours.  The glue will flake off over the next 2-3 weeks.  Any sutures or staples will be removed at the office during your follow-up visit. 8. ACTIVITIES:  You may resume regular daily activities (gradually increasing) beginning the next day.  Wearing a good support bra or sports bra minimizes pain and swelling.  You may have sexual intercourse when it is comfortable. a. You may drive when you no longer are taking prescription pain medication, you can comfortably wear a seatbelt, and you can safely maneuver your car and apply brakes. b. RETURN TO WORK:  ______________________________________________________________________________________ 9. You should see your doctor in the office for a follow-up appointment approximately two weeks after your surgery.  Your doctors nurse will typically make your follow-up appointment when she calls you with your pathology report.  Expect your pathology report 3-4 business days after your surgery.  You may call to check if you do not hear from [place] after three days. 10. OTHER INSTRUCTIONS: _______________________________________________________________________________________________ _____________________________________________________________________________________________________________________________________ _____________________________________________________________________________________________________________________________________ _____________________________________________________________________________________________________________________________________  WHEN TO CALL DR WAKEFIELD: 1. Fever over 101.0 2. Nausea and/or vomiting. 3. Extreme swelling or  bruising. 4. Continued bleeding from incision. 5. Increased pain, redness, or drainage from the incision.  The clinic  staff is available to answer your questions during regular business hours.  Please dont hesitate to call and ask to speak to one of the nurses for clinical concerns.  If you have a medical emergency, go to the nearest emergency room or call 911.  A surgeon from [facility] is always on call at the hospital.  For further questions, please visit [email or web address]    Post Anesthesia Home Care Instructions  Activity: Get plenty of rest for the remainder of the day. A responsible individual must stay with you for 24 hours following the procedure.  For the next 24 hours, DO NOT: -Drive a car -Paediatric nurse -Drink alcoholic beverages -Take any medication unless instructed by your physician -Make any legal decisions or sign important papers.  Meals: Start with liquid foods such as gelatin or soup. Progress to regular foods as tolerated. Avoid greasy, spicy, heavy foods. If nausea and/or vomiting occur, drink only clear liquids until the nausea and/or vomiting subsides. Call your physician if vomiting continues.  Special Instructions/Symptoms: Your throat may feel dry or sore from the anesthesia or the breathing tube placed in your throat during surgery. If this causes discomfort, gargle with warm salt water. The discomfort should disappear within 24 hours.  If you had a scopolamine patch placed behind your ear for the management of post- operative nausea and/or vomiting:  1. The medication in the patch is effective for 72 hours, after which it should be removed.  Wrap patch in a tissue and discard in the trash. Wash hands thoroughly with soap and water. 2. You may remove the patch earlier than 72 hours if you experience unpleasant side effects which may include dry mouth, dizziness or visual disturbances. 3. Avoid touching the patch. Wash your  hands with soap and water after contact with the patch.   No tylenol until after 1:30 pm today.   No ibuprofen until after 3:30 today.

## 2019-02-06 ENCOUNTER — Encounter (HOSPITAL_BASED_OUTPATIENT_CLINIC_OR_DEPARTMENT_OTHER): Payer: Self-pay | Admitting: General Surgery

## 2019-02-06 LAB — SURGICAL PATHOLOGY

## 2019-03-05 DIAGNOSIS — R5383 Other fatigue: Secondary | ICD-10-CM | POA: Diagnosis not present

## 2019-03-05 DIAGNOSIS — E039 Hypothyroidism, unspecified: Secondary | ICD-10-CM | POA: Diagnosis not present

## 2019-03-05 DIAGNOSIS — M797 Fibromyalgia: Secondary | ICD-10-CM | POA: Diagnosis not present

## 2019-03-05 DIAGNOSIS — N951 Menopausal and female climacteric states: Secondary | ICD-10-CM | POA: Diagnosis not present

## 2019-04-21 ENCOUNTER — Other Ambulatory Visit: Payer: Self-pay | Admitting: Allergy

## 2019-04-21 ENCOUNTER — Other Ambulatory Visit: Payer: Self-pay | Admitting: Allergy & Immunology

## 2019-04-25 DIAGNOSIS — R6882 Decreased libido: Secondary | ICD-10-CM | POA: Diagnosis not present

## 2019-04-25 DIAGNOSIS — N951 Menopausal and female climacteric states: Secondary | ICD-10-CM | POA: Diagnosis not present

## 2019-04-25 DIAGNOSIS — R5383 Other fatigue: Secondary | ICD-10-CM | POA: Diagnosis not present

## 2019-05-31 ENCOUNTER — Other Ambulatory Visit: Payer: Self-pay | Admitting: Allergy

## 2019-05-31 ENCOUNTER — Other Ambulatory Visit: Payer: Self-pay | Admitting: Allergy & Immunology

## 2019-06-06 ENCOUNTER — Other Ambulatory Visit: Payer: Self-pay | Admitting: Obstetrics and Gynecology

## 2019-06-06 ENCOUNTER — Other Ambulatory Visit (HOSPITAL_COMMUNITY): Payer: Self-pay | Admitting: Obstetrics and Gynecology

## 2019-06-06 DIAGNOSIS — Z803 Family history of malignant neoplasm of breast: Secondary | ICD-10-CM

## 2019-06-10 ENCOUNTER — Other Ambulatory Visit: Payer: Self-pay | Admitting: Obstetrics and Gynecology

## 2019-06-10 DIAGNOSIS — Z1231 Encounter for screening mammogram for malignant neoplasm of breast: Secondary | ICD-10-CM

## 2019-06-14 DIAGNOSIS — E559 Vitamin D deficiency, unspecified: Secondary | ICD-10-CM | POA: Diagnosis not present

## 2019-06-14 DIAGNOSIS — Z79899 Other long term (current) drug therapy: Secondary | ICD-10-CM | POA: Diagnosis not present

## 2019-06-14 DIAGNOSIS — E7800 Pure hypercholesterolemia, unspecified: Secondary | ICD-10-CM | POA: Diagnosis not present

## 2019-06-18 ENCOUNTER — Other Ambulatory Visit (HOSPITAL_COMMUNITY): Payer: BC Managed Care – PPO

## 2019-06-18 DIAGNOSIS — M8589 Other specified disorders of bone density and structure, multiple sites: Secondary | ICD-10-CM | POA: Insufficient documentation

## 2019-06-18 DIAGNOSIS — G43909 Migraine, unspecified, not intractable, without status migrainosus: Secondary | ICD-10-CM | POA: Insufficient documentation

## 2019-06-18 DIAGNOSIS — N950 Postmenopausal bleeding: Secondary | ICD-10-CM | POA: Diagnosis not present

## 2019-06-18 DIAGNOSIS — J45909 Unspecified asthma, uncomplicated: Secondary | ICD-10-CM | POA: Insufficient documentation

## 2019-06-19 DIAGNOSIS — E60 Dietary zinc deficiency: Secondary | ICD-10-CM | POA: Diagnosis not present

## 2019-06-19 DIAGNOSIS — E039 Hypothyroidism, unspecified: Secondary | ICD-10-CM | POA: Diagnosis not present

## 2019-06-19 DIAGNOSIS — E612 Magnesium deficiency: Secondary | ICD-10-CM | POA: Diagnosis not present

## 2019-06-19 DIAGNOSIS — R5383 Other fatigue: Secondary | ICD-10-CM | POA: Diagnosis not present

## 2019-07-06 ENCOUNTER — Ambulatory Visit: Payer: BC Managed Care – PPO

## 2019-08-07 DIAGNOSIS — N951 Menopausal and female climacteric states: Secondary | ICD-10-CM | POA: Diagnosis not present

## 2019-08-15 ENCOUNTER — Ambulatory Visit: Payer: Medicaid Other | Attending: Internal Medicine

## 2019-08-15 DIAGNOSIS — Z23 Encounter for immunization: Secondary | ICD-10-CM

## 2019-08-15 NOTE — Progress Notes (Signed)
   Covid-19 Vaccination Clinic  Name:  [name]    MRN: [id number] DOB: Sep 26, 1965  08/15/2019  [name] was observed post Covid-19 immunization for 15 minutes without incident. She was provided with Vaccine Information Sheet and instruction to access the V-Safe system.   [name] was instructed to call 911 with any severe reactions post vaccine: Marland Kitchen Difficulty breathing  . Swelling of face and throat  . A fast heartbeat  . A bad rash all over body  . Dizziness and weakness   Immunizations Administered    Name Date Dose VIS Date Route   Pfizer COVID-19 Vaccine 08/15/2019  8:29 AM 0.3 mL 06/05/2018 Intramuscular   Manufacturer: [place]   Lot: [id number]   [place]: [id number]

## 2019-09-10 ENCOUNTER — Ambulatory Visit: Payer: Medicaid Other | Attending: Internal Medicine

## 2019-09-10 DIAGNOSIS — Z23 Encounter for immunization: Secondary | ICD-10-CM

## 2019-09-10 NOTE — Progress Notes (Signed)
   Covid-19 Vaccination Clinic  Name:  Floria Rowlee    MRN: [id number] DOB: 1965/06/11  09/10/2019  Ms. Welford was observed post Covid-19 immunization for 15 minutes without incident. She was provided with Vaccine Information Sheet and instruction to access the V-Safe system.   Ms. Duchemin was instructed to call 911 with any severe reactions post vaccine: Marland Kitchen Difficulty breathing  . Swelling of face and throat  . A fast heartbeat  . A bad rash all over body  . Dizziness and weakness   Immunizations Administered    Name Date Dose VIS Date Route   Pfizer COVID-19 Vaccine 09/10/2019  8:16 AM 0.3 mL 06/05/2018 Intramuscular   Manufacturer: [employer]   Lot: [id number]   [place]: [id number]

## 2019-09-20 DIAGNOSIS — Z9189 Other specified personal risk factors, not elsewhere classified: Secondary | ICD-10-CM | POA: Diagnosis not present

## 2019-09-24 DIAGNOSIS — M797 Fibromyalgia: Secondary | ICD-10-CM | POA: Diagnosis not present

## 2019-09-24 DIAGNOSIS — N951 Menopausal and female climacteric states: Secondary | ICD-10-CM | POA: Diagnosis not present

## 2019-09-24 DIAGNOSIS — E538 Deficiency of other specified B group vitamins: Secondary | ICD-10-CM | POA: Diagnosis not present

## 2019-09-24 DIAGNOSIS — E039 Hypothyroidism, unspecified: Secondary | ICD-10-CM | POA: Diagnosis not present

## 2019-09-24 DIAGNOSIS — W57XXXA Bitten or stung by nonvenomous insect and other nonvenomous arthropods, initial encounter: Secondary | ICD-10-CM | POA: Diagnosis not present

## 2019-09-24 DIAGNOSIS — R897 Abnormal histological findings in specimens from other organs, systems and tissues: Secondary | ICD-10-CM | POA: Diagnosis not present

## 2019-09-24 DIAGNOSIS — R5383 Other fatigue: Secondary | ICD-10-CM | POA: Diagnosis not present

## 2019-11-05 ENCOUNTER — Other Ambulatory Visit: Payer: Self-pay | Admitting: Physician Assistant

## 2019-11-05 DIAGNOSIS — E559 Vitamin D deficiency, unspecified: Secondary | ICD-10-CM | POA: Diagnosis not present

## 2019-11-05 DIAGNOSIS — Z1329 Encounter for screening for other suspected endocrine disorder: Secondary | ICD-10-CM | POA: Diagnosis not present

## 2019-11-05 DIAGNOSIS — Z79899 Other long term (current) drug therapy: Secondary | ICD-10-CM | POA: Diagnosis not present

## 2019-11-05 DIAGNOSIS — Z131 Encounter for screening for diabetes mellitus: Secondary | ICD-10-CM | POA: Diagnosis not present

## 2019-11-05 DIAGNOSIS — Z0000 Encounter for general adult medical examination without abnormal findings: Secondary | ICD-10-CM | POA: Diagnosis not present

## 2019-11-05 DIAGNOSIS — Z1389 Encounter for screening for other disorder: Secondary | ICD-10-CM | POA: Diagnosis not present

## 2019-11-05 DIAGNOSIS — Z13220 Encounter for screening for lipoid disorders: Secondary | ICD-10-CM | POA: Diagnosis not present

## 2019-11-06 ENCOUNTER — Other Ambulatory Visit: Payer: Self-pay | Admitting: Physician Assistant

## 2019-11-06 DIAGNOSIS — G4700 Insomnia, unspecified: Secondary | ICD-10-CM | POA: Diagnosis not present

## 2019-11-06 DIAGNOSIS — Z9189 Other specified personal risk factors, not elsewhere classified: Secondary | ICD-10-CM

## 2019-11-06 DIAGNOSIS — Z1231 Encounter for screening mammogram for malignant neoplasm of breast: Secondary | ICD-10-CM

## 2019-11-06 DIAGNOSIS — N959 Unspecified menopausal and perimenopausal disorder: Secondary | ICD-10-CM | POA: Diagnosis not present

## 2019-11-07 DIAGNOSIS — M797 Fibromyalgia: Secondary | ICD-10-CM | POA: Diagnosis not present

## 2019-11-07 DIAGNOSIS — N951 Menopausal and female climacteric states: Secondary | ICD-10-CM | POA: Diagnosis not present

## 2019-11-07 DIAGNOSIS — Q796 Ehlers-Danlos syndrome, unspecified: Secondary | ICD-10-CM | POA: Diagnosis not present

## 2019-11-07 DIAGNOSIS — G4700 Insomnia, unspecified: Secondary | ICD-10-CM | POA: Diagnosis not present

## 2019-11-08 DIAGNOSIS — G4700 Insomnia, unspecified: Secondary | ICD-10-CM | POA: Diagnosis not present

## 2019-11-12 ENCOUNTER — Other Ambulatory Visit: Payer: Self-pay | Admitting: General Surgery

## 2019-11-12 DIAGNOSIS — Z9189 Other specified personal risk factors, not elsewhere classified: Secondary | ICD-10-CM

## 2019-11-15 DIAGNOSIS — Z9189 Other specified personal risk factors, not elsewhere classified: Secondary | ICD-10-CM | POA: Diagnosis not present

## 2019-11-25 DIAGNOSIS — G4700 Insomnia, unspecified: Secondary | ICD-10-CM | POA: Diagnosis not present

## 2019-11-25 DIAGNOSIS — M797 Fibromyalgia: Secondary | ICD-10-CM | POA: Diagnosis not present

## 2019-11-27 ENCOUNTER — Ambulatory Visit
Admission: RE | Admit: 2019-11-27 | Discharge: 2019-11-27 | Disposition: A | Discharge status: Home / Self Care | Payer: BC Managed Care – PPO | Source: Ambulatory Visit | Attending: General Surgery | Admitting: General Surgery

## 2019-11-27 ENCOUNTER — Other Ambulatory Visit: Payer: Self-pay

## 2019-11-27 DIAGNOSIS — R922 Inconclusive mammogram: Secondary | ICD-10-CM | POA: Diagnosis not present

## 2019-11-27 DIAGNOSIS — Z9189 Other specified personal risk factors, not elsewhere classified: Secondary | ICD-10-CM

## 2019-12-10 DIAGNOSIS — G4700 Insomnia, unspecified: Secondary | ICD-10-CM | POA: Diagnosis not present

## 2019-12-10 DIAGNOSIS — Z9189 Other specified personal risk factors, not elsewhere classified: Secondary | ICD-10-CM | POA: Diagnosis not present

## 2019-12-10 DIAGNOSIS — Z01419 Encounter for gynecological examination (general) (routine) without abnormal findings: Secondary | ICD-10-CM | POA: Diagnosis not present

## 2019-12-10 DIAGNOSIS — M797 Fibromyalgia: Secondary | ICD-10-CM | POA: Diagnosis not present

## 2019-12-10 DIAGNOSIS — Z6823 Body mass index (BMI) 23.0-23.9, adult: Secondary | ICD-10-CM | POA: Diagnosis not present

## 2019-12-18 DIAGNOSIS — Z9189 Other specified personal risk factors, not elsewhere classified: Secondary | ICD-10-CM | POA: Diagnosis not present

## 2020-01-13 ENCOUNTER — Encounter: Payer: Self-pay | Admitting: Neurology

## 2020-01-14 ENCOUNTER — Encounter: Payer: Self-pay | Admitting: Neurology

## 2020-01-14 ENCOUNTER — Ambulatory Visit (INDEPENDENT_AMBULATORY_CARE_PROVIDER_SITE_OTHER): Payer: BC Managed Care – PPO | Admitting: Neurology

## 2020-01-14 VITALS — BP 104/70 | HR 89 | Ht 66.0 in | Wt 145.0 lb

## 2020-01-14 DIAGNOSIS — G4701 Insomnia due to medical condition: Secondary | ICD-10-CM | POA: Diagnosis not present

## 2020-01-14 DIAGNOSIS — N951 Menopausal and female climacteric states: Secondary | ICD-10-CM | POA: Insufficient documentation

## 2020-01-14 DIAGNOSIS — N6011 Diffuse cystic mastopathy of right breast: Secondary | ICD-10-CM

## 2020-01-14 DIAGNOSIS — N6012 Diffuse cystic mastopathy of left breast: Secondary | ICD-10-CM

## 2020-01-14 DIAGNOSIS — G478 Other sleep disorders: Secondary | ICD-10-CM

## 2020-01-14 MED ORDER — CLONAZEPAM 0.5 MG PO TABS: 0.5000 mg | ORAL_TABLET | Freq: Two times a day (BID) | ORAL | 0 refills | Status: DC | PRN

## 2020-01-14 MED ORDER — SERTRALINE HCL 25 MG PO TABS: 25.0000 mg | ORAL_TABLET | Freq: Every day | ORAL | 5 refills | Status: DC

## 2020-01-14 NOTE — Patient Instructions (Signed)
Insomnia Insomnia is a sleep disorder that makes it difficult to fall asleep or stay asleep. Insomnia can cause fatigue, low energy, difficulty concentrating, mood swings, and poor performance at work or school. There are three different ways to classify insomnia:  Difficulty falling asleep.  Difficulty staying asleep.  Waking up too early in the morning. Any type of insomnia can be long-term (chronic) or short-term (acute). Both are common. Short-term insomnia usually lasts for three months or less. Chronic insomnia occurs at least three times a week for longer than three months. What are the causes? Insomnia may be caused by another condition, situation, or substance, such as:  Anxiety.  Certain medicines.  Gastroesophageal reflux disease (GERD) or other gastrointestinal conditions.  Asthma or other breathing conditions.  Restless legs syndrome, sleep apnea, or other sleep disorders.  Chronic pain.  Menopause.  Stroke.  Abuse of alcohol, tobacco, or illegal drugs.  Mental health conditions, such as depression.  Caffeine.  Neurological disorders, such as Alzheimer's disease.  An overactive thyroid (hyperthyroidism). Sometimes, the cause of insomnia may not be known. What increases the risk? Risk factors for insomnia include:  Gender. Women are affected more often than men.  Age. Insomnia is more common as you get older.  Stress.  Lack of exercise.  Irregular work schedule or working night shifts.  Traveling between different time zones.  Certain medical and mental health conditions. What are the signs or symptoms? If you have insomnia, the main symptom is having trouble falling asleep or having trouble staying asleep. This may lead to other symptoms, such as:  Feeling fatigued or having low energy.  Feeling nervous about going to sleep.  Not feeling rested in the morning.  Having trouble concentrating.  Feeling irritable, anxious, or depressed. How  is this diagnosed? This condition may be diagnosed based on:  Your symptoms and medical history. Your health care provider may ask about: ? Your sleep habits. ? Any medical conditions you have. ? Your mental health.  A physical exam. How is this treated? Treatment for insomnia depends on the cause. Treatment may focus on treating an underlying condition that is causing insomnia. Treatment may also include:  Medicines to help you sleep.  Counseling or therapy.  Lifestyle adjustments to help you sleep better. Follow these instructions at home: Eating and drinking   Limit or avoid alcohol, caffeinated beverages, and cigarettes, especially close to bedtime. These can disrupt your sleep.  Do not eat a large meal or eat spicy foods right before bedtime. This can lead to digestive discomfort that can make it hard for you to sleep. Sleep habits   Keep a sleep diary to help you and your health care provider figure out what could be causing your insomnia. Write down: ? When you sleep. ? When you wake up during the night. ? How well you sleep. ? How rested you feel the next day. ? Any side effects of medicines you are taking. ? What you eat and drink.  Make your bedroom a dark, comfortable place where it is easy to fall asleep. ? Put up shades or blackout curtains to block light from outside. ? Use a white noise machine to block noise. ? Keep the temperature cool.  Limit screen use before bedtime. This includes: ? Watching TV. ? Using your smartphone, tablet, or computer.  Stick to a routine that includes going to bed and waking up at the same times every day and night. This can help you fall asleep faster. Consider   making a quiet activity, such as reading, part of your nighttime routine.  Try to avoid taking naps during the day so that you sleep better at night.  Get out of bed if you are still awake after 15 minutes of trying to sleep. Keep the lights down, but try reading or  doing a quiet activity. When you feel sleepy, go back to bed. General instructions  Take over-the-counter and prescription medicines only as told by your health care provider.  Exercise regularly, as told by your health care provider. Avoid exercise starting several hours before bedtime.  Use relaxation techniques to manage stress. Ask your health care provider to suggest some techniques that may work well for you. These may include: ? Breathing exercises. ? Routines to release muscle tension. ? Visualizing peaceful scenes.  Make sure that you drive carefully. Avoid driving if you feel very sleepy.  Keep all follow-up visits as told by your health care provider. This is important. Contact a health care provider if:  You are tired throughout the day.  You have trouble in your daily routine due to sleepiness.  You continue to have sleep problems, or your sleep problems get worse. Get help right away if:  You have serious thoughts about hurting yourself or someone else. If you ever feel like you may hurt yourself or others, or have thoughts about taking your own life, get help right away. You can go to your nearest emergency department or call:  Your local emergency services (911 in the [place]  A suicide crisis helpline, such as the [facility] at (986)414-7079. This is open 24 hours a day. Summary  Insomnia is a sleep disorder that makes it difficult to fall asleep or stay asleep.  Insomnia can be long-term (chronic) or short-term (acute).  Treatment for insomnia depends on the cause. Treatment may focus on treating an underlying condition that is causing insomnia.  Keep a sleep diary to help you and your health care provider figure out what could be causing your insomnia. This information is not intended to replace advice given to you by your health care provider. Make sure you discuss any questions you have with your health care provider. Document  Revised: 03/10/2017 Document Reviewed: 01/05/2017 Elsevier Patient Education  [place]. Clonazepam tablets What is this medicine? CLONAZEPAM (kloe NA ze pam) is a benzodiazepine. It is used to treat certain types of seizures. It is also used to treat panic disorder. This medicine may be used for other purposes; ask your health care provider or pharmacist if you have questions. COMMON BRAND NAME(S): Ceberclon, Klonopin What should I tell my health care provider before I take this medicine? They need to know if you have any of these conditions:  an alcohol or drug abuse problem  bipolar disorder, depression, psychosis or other mental health condition  glaucoma  kidney or liver disease  lung or breathing disease  myasthenia gravis  Parkinson's disease  porphyria  seizures or a history of seizures  suicidal thoughts  an unusual or allergic reaction to clonazepam, other benzodiazepines, foods, dyes, or preservatives  pregnant or trying to get pregnant  breast-feeding How should I use this medicine? Take this medicine by mouth with a glass of water. Follow the directions on the prescription label. If it upsets your stomach, take it with food or milk. Take your medicine at regular intervals. Do not take it more often than directed. Do not stop taking or change the dose except on the  advice of your doctor or health care professional. A special MedGuide will be given to you by the pharmacist with each prescription and refill. Be sure to read this information carefully each time. Talk to your pediatrician regarding the use of this medicine in children. Special care may be needed. Overdosage: If you think you have taken too much of this medicine contact a poison control center or emergency room at once. NOTE: This medicine is only for you. Do not share this medicine with others. What if I miss a dose? If you miss a dose, take it as soon as you can. If it is almost time for  your next dose, take only that dose. Do not take double or extra doses. What may interact with this medicine? Do not take this medication with any of the following medicines:  narcotic medicines for cough  sodium oxybate This medicine may also interact with the following medications:  alcohol  antihistamines for allergy, cough and cold  antiviral medicines for HIV or AIDS  certain medicines for anxiety or sleep  certain medicines for depression, like amitriptyline, fluoxetine, sertraline  certain medicines for fungal infections like ketoconazole and itraconazole  certain medicines for seizures like carbamazepine, phenobarbital, phenytoin, primidone  general anesthetics like halothane, isoflurane, methoxyflurane, propofol  local anesthetics like lidocaine, pramoxine, tetracaine  medicines that relax muscles for surgery  narcotic medicines for pain  phenothiazines like chlorpromazine, mesoridazine, prochlorperazine, thioridazine This list may not describe all possible interactions. Give your health care provider a list of all the medicines, herbs, non-prescription drugs, or dietary supplements you use. Also tell them if you smoke, drink alcohol, or use illegal drugs. Some items may interact with your medicine. What should I watch for while using this medicine? Tell your doctor or health care professional if your symptoms do not start to get better or if they get worse. Do not stop taking except on your doctor's advice. You may develop a severe reaction. Your doctor will tell you how much medicine to take. You may get drowsy or dizzy. Do not drive, use machinery, or do anything that needs mental alertness until you know how this medicine affects you. To reduce the risk of dizzy and fainting spells, do not stand or sit up quickly, especially if you are an older patient. Alcohol may increase dizziness and drowsiness. Avoid alcoholic drinks. If you are taking another medicine that also  causes drowsiness, you may have more side effects. Give your health care provider a list of all medicines you use. Your doctor will tell you how much medicine to take. Do not take more medicine than directed. Call emergency for help if you have problems breathing or unusual sleepiness. The use of this medicine may increase the chance of suicidal thoughts or actions. Pay special attention to how you are responding while on this medicine. Any worsening of mood, or thoughts of suicide or dying should be reported to your health care professional right away. What side effects may I notice from receiving this medicine? Side effects that you should report to your doctor or health care professional as soon as possible:  allergic reactions like skin rash, itching or hives, swelling of the face, lips, or tongue  breathing problems  confusion  loss of balance or coordination  signs and symptoms of low blood pressure like dizziness; feeling faint or lightheaded, falls; unusually weak or tired  suicidal thoughts or mood changes Side effects that usually do not require medical attention (report to your doctor  or health care professional if they continue or are bothersome):  dizziness  headache  tiredness  upset stomach This list may not describe all possible side effects. Call your doctor for medical advice about side effects. You may report side effects to FDA at 1-800-FDA-1088. Where should I keep my medicine? Keep out of the reach of children. This medicine can be abused. Keep your medicine in a safe place to protect it from theft. Do not share this medicine with anyone. Selling or giving away this medicine is dangerous and against the law. This medicine may cause accidental overdose and death if taken by other adults, children, or pets. Mix any unused medicine with a substance like cat litter or coffee grounds. Then throw the medicine away in a sealed container like a sealed bag or a coffee can  with a lid. Do not use the medicine after the expiration date. Store at room temperature between 15 and 30 degrees C (59 and 86 degrees F). Protect from light. Keep container tightly closed. NOTE: This sheet is a summary. It may not cover all possible information. If you have questions about this medicine, talk to your doctor, pharmacist, or health care provider.  2020 Elsevier/Gold Standard (2015-09-04 18:46:32) Sertraline tablets What is this medicine? SERTRALINE (SER tra leen) is used to treat depression. It may also be used to treat obsessive compulsive disorder, panic disorder, post-trauma stress, premenstrual dysphoric disorder (PMDD) or social anxiety. This medicine may be used for other purposes; ask your health care provider or pharmacist if you have questions. COMMON BRAND NAME(S): Zoloft What should I tell my health care provider before I take this medicine? They need to know if you have any of these conditions:  bleeding disorders  bipolar disorder or a family history of bipolar disorder  glaucoma  heart disease  high blood pressure  history of irregular heartbeat  history of low levels of calcium, magnesium, or potassium in the blood  if you often drink alcohol  liver disease  receiving electroconvulsive therapy  seizures  suicidal thoughts, plans, or attempt; a previous suicide attempt by you or a family member  take medicines that treat or prevent blood clots  thyroid disease  an unusual or allergic reaction to sertraline, other medicines, foods, dyes, or preservatives  pregnant or trying to get pregnant  breast-feeding How should I use this medicine? Take this medicine by mouth with a glass of water. Follow the directions on the prescription label. You can take it with or without food. Take your medicine at regular intervals. Do not take your medicine more often than directed. Do not stop taking this medicine suddenly except upon the advice of your  doctor. Stopping this medicine too quickly may cause serious side effects or your condition may worsen. A special MedGuide will be given to you by the pharmacist with each prescription and refill. Be sure to read this information carefully each time. Talk to your pediatrician regarding the use of this medicine in children. While this drug may be prescribed for children as young as 7 years for selected conditions, precautions do apply. Overdosage: If you think you have taken too much of this medicine contact a poison control center or emergency room at once. NOTE: This medicine is only for you. Do not share this medicine with others. What if I miss a dose? If you miss a dose, take it as soon as you can. If it is almost time for your next dose, take only that dose. Do  not take double or extra doses. What may interact with this medicine? Do not take this medicine with any of the following medications:  cisapride  dronedarone  linezolid  MAOIs like Carbex, Eldepryl, Marplan, Nardil, and Parnate  methylene blue (injected into a vein)  pimozide  thioridazine This medicine may also interact with the following medications:  alcohol  amphetamines  aspirin and aspirin-like medicines  certain medicines for depression, anxiety, or psychotic disturbances  certain medicines for fungal infections like ketoconazole, fluconazole, posaconazole, and itraconazole  certain medicines for irregular heart beat like flecainide, quinidine, propafenone  certain medicines for migraine headaches like almotriptan, eletriptan, frovatriptan, naratriptan, rizatriptan, sumatriptan, zolmitriptan  certain medicines for sleep  certain medicines for seizures like carbamazepine, valproic acid, phenytoin  certain medicines that treat or prevent blood clots like warfarin, enoxaparin, dalteparin  cimetidine  digoxin  diuretics  fentanyl  isoniazid  lithium  NSAIDs, medicines for pain and inflammation,  like ibuprofen or naproxen  other medicines that prolong the QT interval (cause an abnormal heart rhythm) like dofetilide  rasagiline  safinamide  supplements like St. John's wort, kava kava, valerian  tolbutamide  tramadol  tryptophan This list may not describe all possible interactions. Give your health care provider a list of all the medicines, herbs, non-prescription drugs, or dietary supplements you use. Also tell them if you smoke, drink alcohol, or use illegal drugs. Some items may interact with your medicine. What should I watch for while using this medicine? Tell your doctor if your symptoms do not get better or if they get worse. Visit your doctor or health care professional for regular checks on your progress. Because it may take several weeks to see the full effects of this medicine, it is important to continue your treatment as prescribed by your doctor. Patients and their families should watch out for new or worsening thoughts of suicide or depression. Also watch out for sudden changes in feelings such as feeling anxious, agitated, panicky, irritable, hostile, aggressive, impulsive, severely restless, overly excited and hyperactive, or not being able to sleep. If this happens, especially at the beginning of treatment or after a change in dose, call your health care professional. Dennis Bast may get drowsy or dizzy. Do not drive, use machinery, or do anything that needs mental alertness until you know how this medicine affects you. Do not stand or sit up quickly, especially if you are an older patient. This reduces the risk of dizzy or fainting spells. Alcohol may interfere with the effect of this medicine. Avoid alcoholic drinks. Your mouth may get dry. Chewing sugarless gum or sucking hard candy, and drinking plenty of water may help. Contact your doctor if the problem does not go away or is severe. What side effects may I notice from receiving this medicine? Side effects that you should  report to your doctor or health care professional as soon as possible:  allergic reactions like skin rash, itching or hives, swelling of the face, lips, or tongue  anxious  black, tarry stools  changes in vision  confusion  elevated mood, decreased need for sleep, racing thoughts, impulsive behavior  eye pain  fast, irregular heartbeat  feeling faint or lightheaded, falls  feeling agitated, angry, or irritable  hallucination, loss of contact with reality  loss of balance or coordination  loss of memory  painful or prolonged erections  restlessness, pacing, inability to keep still  seizures  stiff muscles  suicidal thoughts or other mood changes  trouble sleeping  unusual bleeding  or bruising  unusually weak or tired  vomiting Side effects that usually do not require medical attention (report to your doctor or health care professional if they continue or are bothersome):  change in appetite or weight  change in sex drive or performance  diarrhea  increased sweating  indigestion, nausea  tremors This list may not describe all possible side effects. Call your doctor for medical advice about side effects. You may report side effects to FDA at 1-800-FDA-1088. Where should I keep my medicine? Keep out of the reach of children. Store at room temperature between 15 and 30 degrees C (59 and 86 degrees F). Throw away any unused medicine after the expiration date. NOTE: This sheet is a summary. It may not cover all possible information. If you have questions about this medicine, talk to your doctor, pharmacist, or health care provider.  2020 Elsevier/Gold Standard (2018-03-20 10:09:27)

## 2020-01-14 NOTE — Progress Notes (Signed)
SLEEP MEDICINE CLINIC    Provider:  [name], MD  Primary Care Physician:  Julian Hy, PA-C [place]  [place] [address]     Referring Provider: Rolm Bookbinder, MD.         Chief Complaint according to patient   Patient presents with:     New Patient (Initial Visit)           HISTORY OF PRESENT ILLNESS:  Lindsey [name] is a 54  Year- old  Caucasian female patient and seen here upon Sleep consultation request by Dr. Rolm Bookbinder, on 01/14/2020.  Chief concern according to patient :  " I am in a lot of pain related to my Ehlers-Danlos condition, have seen Dr Jannifer Franklin for carpal tunnel and Fibromyalgia . I have back pain, wrist and joint pain, affecting my sleep too. October 27th 2020 I had "breast cancer surgery", and have been on tamoxifen until d/c in June 2021. These hormone changes came with insomnia. Atypical cells , rather than cancerous.    I have the pleasure of seeing Lindsey [name] today, a right -handed White or Caucasian female with a possible sleep disorder.  She   has a past medical history of Allergy, Angio-edema, Arthritis, allergies- seasonal and Asthma, Carpal tunnel syndrome ( dr. Willis-07/07/2015), variant cough (12/02/2015), Chronic pain syndrome (08/10/2016), Ehlers-Danlos syndrome, Essential alopecia of women (04/14/2011), Family history of breast cancer in female (08/26/2015), Fibrocystic breast changes of both breasts (07/19/2015), Fibromyalgia, Hypermobility syndrome (11/26/2014), Hypertrophy of vulva (07/20/2017), Inflammatory dermatosis (04/14/2011), Irritable larynx syndrome (12/02/2015), Lumbar radiculopathy (02/16/2016), Osteoarthritis (04/01/2017), Osteopenia, Primary arthrosis of first carpometacarpal joints, bilateral (06/01/2015), and Sacroiliac joint dysfunction of right side (11/26/2014).   Sudden onset of insomnia since March 2021, after abrupt d/c of all hormone pills- " I got zero sleep ". .     Family medical Rachelle Hora  history: mother has insomnia since menopause.   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  Family status is single.  The patient currently works 8-5. Pets are not present. Tobacco use- never .  ETOH use; seldomly,  Caffeine intake in form of Coffee( 2 a day) . Regular exercise in form of horseback riding. .      Sleep habits are as follows: The patient's dinner time is between 6-8 PM. The patient goes to bed at 10.30 PM and can initiate sleep well, but not stay asleep. She continues to sleep but wakes up too early , at 4 AM- no bathroom breaks. She averages between 4-6 hours - improved over March 2021.  The preferred sleep position is prone , with the support of 1-2 pillows.  Dreams are reportedly infrequent/vivid- on trazodone.  7.30 AM is the usual rise time. The patient wakes up spontaneously- at 4 or 5 AM.  He/ She reports not feeling refreshed or restored in AM, with symptoms such as dry mouth ( antihistamins) , morning headaches , and residual fatigue. Naps are not taken.   Review of Systems: Out of a complete 14 system review, the patient complains of only the following symptoms, and all other reviewed systems are negative.:  Fatigue, sleepiness , snoring, fragmented sleep, Insomnia- early morning waking. Feeling always tired.  Effexor d/c.  Pain, pain, pain.   Shortness of breath " may be deconditioned"     How likely are you to doze in the following situations: 0 = not likely, 1 =  slight chance, 2 = moderate chance, 3 = high chance   Sitting and Reading? Watching Television? Sitting inactive in a public place (theater or meeting)? As a passenger in a car for an hour without a break? Lying down in the afternoon when circumstances permit? Sitting and talking to someone? Sitting quietly after lunch without alcohol? In a car, while stopped for a few minutes in traffic?   Total = 4/ 24 points   FSS endorsed  at  57/ 63 points.   Social History   Socioeconomic History   Marital status: Single    Spouse name: n/a   Number of children: 0   Years of education: Not on file   Highest education level: Not on file  Occupational History   Occupation: Education administrator    Comment: [employer]  Tobacco Use   Smoking status: Never Smoker   Smokeless tobacco: Never Used  Vaping Use   Vaping Use: Never used  Substance and Sexual Activity   Alcohol use: No    Alcohol/week: 0.0 standard drinks   Drug use: No   Sexual activity: Not on file  Other Topics Concern   Not on file  Social History Narrative   Not on file   Social Determinants of Health   Financial Resource Strain:    Difficulty of Paying Living Expenses: Not on file  Food Insecurity:    Worried About [place] in the Last Year: Not on file   Ran Out of Food in the Last Year: Not on file  Transportation Needs:    Lack of Transportation (Medical): Not on file   Lack of Transportation (Non-Medical): Not on file  Physical Activity:    Days of Exercise per Week: Not on file   Minutes of Exercise per Session: Not on file  Stress:    Feeling of Stress : Not on file  Social Connections:    Frequency of Communication with Friends and Family: Not on file   Frequency of Social Gatherings with Friends and Family: Not on file   Attends Religious Services: Not on file   Active Member of Clubs or Organizations: Not on file   Attends Archivist Meetings: Not on file   Marital Status: Not on file    Family History  Problem Relation Age of Onset   Hypertension Mother    Other Mother        hx of hysterectomy at 62-56 for prolapsed uterus; hx of benign L arm cyst in her 20s   Stroke Father    Hypertension Father    Skin cancer Father 28       NOS   Other Father        enlarged prostate s/p surgery   Alzheimer's disease Maternal Aunt    Stomach cancer Paternal Aunt         d. 46; was a Manufacturing engineer and did not go to the doctor, so not sure age of onset   Congestive Heart Failure Maternal Grandmother    Prostate cancer Maternal Grandfather        dx. 75-76   Heart Problems Paternal Grandmother    Heart Problems Paternal Grandfather    Colon cancer Other        maternal great aunt (MGM's sister) dx in her 9s   Heart attack Paternal Uncle 66   Heart Problems Paternal Uncle    Angina Paternal Uncle    Breast cancer Cousin  paternal 1st cousin d. 24   Breast cancer Cousin        paternal 1st cousin dx. 62s   Colon polyps Neg Hx    Esophageal cancer Neg Hx    Rectal cancer Neg Hx     Past Medical History:  Diagnosis Date   Allergy    Angio-edema    Arthritis    Asthma    Carpal tunnel syndrome 07/07/2015   right   Chronic cough 12/02/2015   Chronic pain syndrome 08/10/2016   Ehlers-Danlos syndrome    Essential alopecia of women 04/14/2011   Family history of breast cancer in female 08/26/2015   (x2) paternal 1st cousins dx in their 44s    Fibrocystic breast changes of both breasts 07/19/2015   Fibromyalgia    Hypermobility syndrome 11/26/2014   Based on Fam Hx and Beighton Score of 7 this is likely ED III    Hypertrophy of vulva 07/20/2017   Inflammatory dermatosis 04/14/2011   Irritable larynx syndrome 12/02/2015   Lumbar radiculopathy 02/16/2016   Osteoarthritis 04/01/2017   Osteopenia    Primary arthrosis of first carpometacarpal joints, bilateral 06/01/2015   Sacroiliac joint dysfunction of right side 11/26/2014   B SI joint xrays last done at [facility] Dr. Kathlene November 03/13/2017    Past Surgical History:  Procedure Laterality Date   BREAST EXCISIONAL BIOPSY Left 01/2019   atypical lobular hyperplasia   DENTAL SURGERY     FRACTURE SURGERY     as child   RADIOACTIVE SEED GUIDED EXCISIONAL BREAST BIOPSY Left 02/05/2019   Procedure: RADIOACTIVE SEED GUIDED EXCISIONAL LEFT BREAST BIOPSY;   Surgeon: Rolm Bookbinder, MD;  Location: [place];  Service: General;  Laterality: Left;     Current Outpatient Medications on File Prior to Visit  Medication Sig Dispense Refill   Acetaminophen (TYLENOL 8 HOUR ARTHRITIS PAIN PO) Take 1,300 mg by mouth in the morning and at bedtime.     Albuterol Sulfate (PROAIR RESPICLICK) 102 (90 Base) MCG/ACT AEPB Inhale into the lungs as needed.     Ascorbic Acid (VITAMIN C PO) Take 1,200 mg by mouth 2 (two) times daily.      CHITOSAN PO Take 1,000 mg by mouth in the morning and at bedtime.     Cholecalciferol (VITAMIN D3) 1.25 MG (50000 UT) TABS Take 1 tablet by mouth once a week.     cyclobenzaprine (FLEXERIL) 10 MG tablet cyclobenzaprine 10 mg tablet     cycloSPORINE (RESTASIS) 0.05 % ophthalmic emulsion Place 1 drop into both eyes 2 (two) times daily.     diphenhydrAMINE HCl (BENADRYL ALLERGY PO) 40 mg at bedtime.     Doxepin HCl 3 MG TABS Take 1 tablet by mouth at bedtime.     FERROUS BISGLYCINATE CHELATE PO Take 18 mg by mouth daily.     fexofenadine (ALLEGRA ALLERGY) 180 MG tablet Take 180 mg by mouth daily.     folic acid (FOLVITE) 585 MCG tablet Take 400 mcg by mouth daily.     gabapentin (NEURONTIN) 300 MG capsule Take 300 mg by mouth at bedtime.     hyoscyamine (LEVBID) 0.375 MG 12 hr tablet Take by mouth.     ipratropium (ATROVENT) 0.06 % nasal spray Place 2 sprays into both nostrils 2 (two) times daily as needed.     LEVOCETIRIZINE DIHYDROCHLORIDE PO Take 5 mg by mouth at bedtime.     MAGNESIUM MALATE PO Take 450 mg by mouth in the morning and at bedtime.  MELATONIN PO Take 30 mg by mouth at bedtime.     Menaquinone-7 (VITAMIN K2 PO) Take 150 mcg by mouth every morning.     montelukast (SINGULAIR) 10 MG tablet Take 1 tablet by mouth at bedtime.     NALTREXONE HCL PO Take by mouth.     OVER THE COUNTER MEDICATION 306 mg every morning. Estrogen control     OVER THE COUNTER MEDICATION 2 capsules  every morning. MTHFR Endure (RIH)     PHOSPHATIDYLSERINE PO 200 mg every morning.     Pregnenolone Micronized (PREGNENOLONE PO) Take 75 mg by mouth 3 (three) times a week.     Testosterone 75 MG PLLT 75 mg every 3 (three) months. Insert 1 pellet every 3 months     traZODone (DESYREL) 100 MG tablet Take 200 mg by mouth at bedtime.     No current facility-administered medications on file prior to visit.    Allergies  Allergen Reactions   Poison Oak Extract [Poison Oak Extract] Anaphylaxis   Xiidra [Lifitegrast] Swelling    Eye swelling and blurred vision.    Penicillins Hives   Sulfa Antibiotics Nausea And Vomiting   Sulfamethoxazole-Trimethoprim     Physical exam:  Today's Vitals   01/14/20 0911  BP: 104/70  Pulse: 89  Weight: 145 lb (65.8 kg)  Height: 5\' 6"  (1.676 m)   Body mass index is 23.4 kg/m.   Wt Readings from Last 3 Encounters:  01/14/20 145 lb (65.8 kg)  01/29/19 138 lb (62.6 kg)  02/12/18 141 lb 12.8 oz (64.3 kg)     Ht Readings from Last 3 Encounters:  01/14/20 5\' 6"  (1.676 m)  01/29/19 5\' 6"  (1.676 m)  02/12/18 5\' 6"  (1.676 m)      General: The patient is awake, alert and appears not in acute distress. The patient is well groomed. Head: Normocephalic, atraumatic. Neck is supple. Mallampati 2,  neck circumference: 13.5  inches . Nasal airflow congested  .  Retrognathia is not seen.  Dental status: intact.  Cardiovascular:  Regular rate and cardiac rhythm by pulse,  without distended neck veins. Respiratory: Lungs are clear to auscultation.  Skin:  Without evidence of ankle edema, or rash. Trunk: The patient's posture is erect.   Neurologic exam : The patient is awake and alert, oriented to place and time.   Memory subjective described as intact.  Attention span & concentration ability appears normal.  Speech is fluent,  without  dysarthria, dysphonia or aphasia.  Mood and affect are appropriate.   Cranial nerves: no loss of smell or taste  reported  Pupils are equal and briskly reactive to light. Funduscopic exam deferred. No evidence of lens displacement.   Extraocular movements in vertical and horizontal planes were intact and without nystagmus.  No Diplopia. Visual fields by finger perimetry are intact. Hearing was intact to soft voice and finger rubbing.    Facial sensation intact to fine touch.  Facial motor strength is symmetric and tongue and uvula move midline.  Neck ROM : rotation, tilt and flexion extension were normal for age and shoulder shrug was symmetrical.    Motor exam:  Symmetric bulk, tone and ROM.   Normal tone without cog wheeling, symmetric grip strength .   Sensory:  Fine touch, pinprick and vibration were normal.  Proprioception tested in the upper extremities was normal.   Coordination: Rapid alternating movements in the fingers/hands were of normal speed.  The Finger-to-nose maneuver was intact without evidence of ataxia, dysmetria or  tremor.   Gait and station: Patient could rise unassisted from a seated position, walked without assistive device.  Stance is of normal width/ base and the patient turned with 3 steps.  Toe and heel walk were deferred.  Deep tendon reflexes: in the  upper and lower extremities are symmetric and intact.  Babinski response was deferred.       After spending a total time of 45  minutes face to face and additional time for physical and neurologic examination, review of laboratory studies,  personal review of imaging studies, reports and results of other testing and review of referral information / records as far as provided in visit, I have established the following assessments:  1) insomnia prone patient with acute onset of severe sleeplessnes in March, clearly related to hormonal replacement  therapy - discontinuation . Menopause for 7 years- hot flashes h are better now, sleep is a little better but under doses of Effexor, doxepin, sleep aids, antihistamines.      My Plan is to proceed with:  1) Acute menopausal symptoms caused the onset of insomnia. We work to help getting restorative sleep again. D/c Benadryl- this affects memory badly ( antihistamine). I like to use an SSRI- may be Zoloft ? 25 mg can be taken at night.  I will order a PSG - she kicks a lot in her sleep- check on PLMs.  Not getting these data from a HST.  I will start prescribing low dose Klonopin.   2) Pain has been a chronic condition- not changed recently and not treated here. She is treated with neltraxone at [facility].    I would like to thank Julian Hy, PA-C and Julian Hy, [place] ,  [place] [address] for allowing me to meet with and to take care of this pleasant patient.   In short, Lindsey [name] is presenting with acute onset insomnia following HRT discontinuation in the treatment of atypical breast findings- non -cancerous.  I plan to follow up either personally or through our NP within 3 month.   CC: I will share my notes with PCP. And Dr Donne Hazel. .  Electronically signed by: [name], MD 01/14/2020 9:13 AM  [facility] Board certified by The [employer] of Sleep Medicine and Diplomate of the [employer] of Sleep Medicine. Board certified In Neurology through the [place], Fellow of the [employer] of Neurology. Medical Director of [facility].

## 2020-01-16 ENCOUNTER — Telehealth: Payer: Self-pay

## 2020-01-16 NOTE — Telephone Encounter (Signed)
LVM to schedule for in-lab overnight sleep study

## 2020-02-05 ENCOUNTER — Other Ambulatory Visit: Payer: Self-pay | Admitting: Neurology

## 2020-02-06 ENCOUNTER — Other Ambulatory Visit: Payer: Self-pay

## 2020-02-06 ENCOUNTER — Ambulatory Visit (INDEPENDENT_AMBULATORY_CARE_PROVIDER_SITE_OTHER): Payer: BC Managed Care – PPO | Admitting: Neurology

## 2020-02-06 DIAGNOSIS — N951 Menopausal and female climacteric states: Secondary | ICD-10-CM

## 2020-02-06 DIAGNOSIS — G478 Other sleep disorders: Secondary | ICD-10-CM

## 2020-02-06 DIAGNOSIS — G4701 Insomnia due to medical condition: Secondary | ICD-10-CM

## 2020-02-06 DIAGNOSIS — N6012 Diffuse cystic mastopathy of left breast: Secondary | ICD-10-CM

## 2020-02-06 DIAGNOSIS — N6011 Diffuse cystic mastopathy of right breast: Secondary | ICD-10-CM

## 2020-02-10 ENCOUNTER — Other Ambulatory Visit: Payer: Self-pay | Admitting: Neurology

## 2020-02-21 NOTE — Procedures (Signed)
PATIENT'S NAME:  Lindsey [name], Lindsey [name] DOB:      11-20-1965      MR#:    [id number]     DATE OF RECORDING: 02/06/2020 REFERRING M.D.:  Rolm Bookbinder, MD  Study Performed:   Baseline Polysomnogram HISTORY:    HISTORY OF PRESENT ILLNESS: Lindsey [name] is a 54 year- old Caucasian female patient was seen upon referral for a Sleep consultation, requested by Dr. Rolm Bookbinder, on 01/14/2020.  Chief concern according to patient :  " I am in a lot of pain related to my Ehlers-Danlos condition, have seen Dr Jannifer Franklin for carpal tunnel and Fibromyalgia . I have back pain, wrist and joint pain, affecting my sleep too. October 27th 2020 I had "breast cancer surgery", and have been on tamoxifen until d/c in June 2021. These hormone changes came with insomnia. Sudden onset of insomnia since March 2021, after abrupt d/c of all hormone pills- " I got zero sleep".    The patient endorsed the Epworth Sleepiness Scale at 4/24 points.   The patient's weight 145 pounds with a height of 66 (inches), resulting in a BMI of 23.4 kg/m2. The patient's neck circumference measured 13.5 inches.  CURRENT MEDICATIONS: Trazadone, Testosterone, Pregnenolone, Phosphatidylserine, Naltrexone, Menaquinone, Melatonin, Magnesium, Levocetirizine, Zoloft, Atrovent, Levbid, Neurontin, Folvite, Allegra, Flexeril, Vit D, Chitosan, Albuterol, Tylenol. The patient took, Klonopin, Trazodone and Melatonin at bedtime.    PROCEDURE:  This is a multichannel digital polysomnogram utilizing the Somnostar 11.2 system.  Electrodes and sensors were applied and monitored per AASM Specifications.   EEG, EOG, Chin and Limb EMG, were sampled at 200 Hz.  ECG, Snore and Nasal Pressure, Thermal Airflow, Respiratory Effort, CPAP Flow and Pressure, Oximetry was sampled at 50 Hz. Digital video and audio were recorded.      BASELINE STUDY: Lights Out was at 23:14 and Lights On at 05:01.  Total recording time (TRT) was 347.5 minutes, with a total sleep time  (TST) of 337 minutes.  The patient's sleep latency was 7.5 minutes.  REM latency was 100.5 minutes.  The sleep efficiency was 97.0 %.     SLEEP ARCHITECTURE: WASO (Wake after sleep onset) was 3 minutes.  There were 12.5 minutes in Stage N1, 192 minutes Stage N2, 42 minutes Stage N3 and 90.5 minutes in Stage REM.  The percentage of Stage N1 was 3.7%, Stage N2 was 57.%, Stage N3 was 12.5% and Stage R (REM sleep) was 26.9%.    RESPIRATORY ANALYSIS:  There were a total of 0 respiratory events:  0 obstructive apneas, 0 central apneas and 0 mixed apneas with a total of 0 apneas and an apnea index (AI) of 0 /hour. There were 0 hypopneas with a hypopnea index of 0 /hour. The total APNEA/HYPOPNEA INDEX (AHI) was 0/hour.0 events occurred in REM sleep and 0 events in NREM. The patient spent 0 minutes of total sleep time in the supine position and 337 minutes in non-supine.   OXYGEN SATURATION & C02:  The Wake baseline 02 saturation was 97%, with the lowest being 92%. Time spent below 89% saturation equaled 0 minutes.  The arousals were noted as: 20 were spontaneous, 5 were associated with PLMs, 0 were associated with respiratory events. The patient had a total of 85 Periodic Limb Movements.  The Periodic Limb Movement (PLM) index was 15.1 and the PLM Arousal index was 0.9/hour.  Audio and video analysis did not show any abnormal or unusual movements, behaviors, phonations or vocalizations.  No bathroom breaks. No Snoring was  noted. EKG was in keeping with normal sinus rhythm (NSR). Post-study, the patient indicated that sleep was lighter than usual, and shorter.   IMPRESSION:  1. Very mild Periodic Limb Movement Disorder (PLMD) and only in non -supine left sided sleep- this sleep position may trigger discomfort.  2. No Dysfunctions associated with sleep stages or arousal from sleep 3. Normal EEG and EKG. 4. High sleep efficiency- short latency and less fragmentation after 1 AM.   RECOMMENDATIONS:  The  recent medication changes may have let to this rather normal sleep pattern with normal sleep architecture. The mild PLMs would not need additional treatment. The sense of non- restorative sleep may persist.   I certify that I have reviewed the entire raw data recording prior to the issuance of this report in accordance with the Standards of Accreditation of the American Academy of Sleep Medicine (AASM)    [name], MD Diplomat, American Board of Psychiatry and Neurology  Diplomat, American Board of Sleep Medicine Market researcher, [place] [facility] [employer]

## 2020-02-21 NOTE — Progress Notes (Signed)
The  expanded EEG sleep study documented rather normal sleep- on medications, but good sleep stage proportion, efficiency , latency

## 2020-02-24 ENCOUNTER — Telehealth: Payer: Self-pay | Admitting: Neurology

## 2020-02-24 ENCOUNTER — Encounter: Payer: Self-pay | Admitting: Neurology

## 2020-02-24 ENCOUNTER — Other Ambulatory Visit: Payer: Self-pay | Admitting: Neurology

## 2020-02-24 MED ORDER — CLONAZEPAM 0.5 MG PO TABS
0.5000 mg | ORAL_TABLET | Freq: Every day | ORAL | 5 refills | Status: DC
Start: 2020-02-24 — End: 2020-08-31

## 2020-02-24 NOTE — Telephone Encounter (Signed)
Spoke with Dr Brett Fairy and she agrees to continuing the xanax clonazepam 0.5 mg at bedtime for the patient. Order will be sent to the pharmacy for the patient.

## 2020-02-24 NOTE — Telephone Encounter (Signed)
-----   Message from [name], MD sent at 02/21/2020 12:00 PM EST ----- The  expanded EEG sleep study documented rather normal sleep- on medications, but good sleep stage proportion, efficiency , latency

## 2020-02-24 NOTE — Telephone Encounter (Signed)
Pt is requesting a refill for clonazePAM (KLONOPIN) 0.5 MG tablet.  Pharmacy: [id number]

## 2020-02-24 NOTE — Telephone Encounter (Signed)
Called the patient and reviewed the sleep study. Pt was on clonazepam that was prescribed for the study during the night. She states since the study she has not been taking the clonazepam due to not being refilled. She is waking up early and not getting restorative sleep. When she was taking the medication ordered she was able to sleep through the night and felt really rested. Since being off of it she is back to square one she describes. Informed her that I would discuss with Dr Brett Fairy if she will like to continue that medication or try an alternative medication. Clarified that the pharmacy on file is correct which [facility]. Advised I would call back with what Dr Brett Fairy recommends.

## 2020-02-24 NOTE — Telephone Encounter (Signed)
Discussed with the patient in other phone call when called with her SSR. Will advise the patient what Dr Brett Fairy recommends.

## 2020-03-03 ENCOUNTER — Other Ambulatory Visit: Payer: BC Managed Care – PPO

## 2020-03-20 ENCOUNTER — Ambulatory Visit
Admission: RE | Admit: 2020-03-20 | Discharge: 2020-03-20 | Disposition: A | Discharge status: Home / Self Care | Payer: BC Managed Care – PPO | Source: Ambulatory Visit | Attending: Physician Assistant | Admitting: Physician Assistant

## 2020-03-20 ENCOUNTER — Other Ambulatory Visit: Payer: Self-pay

## 2020-03-20 DIAGNOSIS — N6489 Other specified disorders of breast: Secondary | ICD-10-CM | POA: Diagnosis not present

## 2020-03-20 DIAGNOSIS — Z9189 Other specified personal risk factors, not elsewhere classified: Secondary | ICD-10-CM

## 2020-03-20 MED ORDER — GADOBUTROL 1 MMOL/ML IV SOLN
7.0000 mL | Freq: Once | INTRAVENOUS | Status: AC | PRN
  Administered 2020-03-20: 7 mL via INTRAVENOUS

## 2020-04-11 DIAGNOSIS — K9041 Non-celiac gluten sensitivity: Secondary | ICD-10-CM | POA: Insufficient documentation

## 2020-05-12 DIAGNOSIS — R5383 Other fatigue: Secondary | ICD-10-CM | POA: Diagnosis not present

## 2020-05-12 DIAGNOSIS — M797 Fibromyalgia: Secondary | ICD-10-CM | POA: Diagnosis not present

## 2020-05-12 DIAGNOSIS — E538 Deficiency of other specified B group vitamins: Secondary | ICD-10-CM | POA: Diagnosis not present

## 2020-05-12 DIAGNOSIS — E559 Vitamin D deficiency, unspecified: Secondary | ICD-10-CM | POA: Diagnosis not present

## 2020-05-12 DIAGNOSIS — Z13220 Encounter for screening for lipoid disorders: Secondary | ICD-10-CM | POA: Diagnosis not present

## 2020-05-12 DIAGNOSIS — N951 Menopausal and female climacteric states: Secondary | ICD-10-CM | POA: Diagnosis not present

## 2020-05-12 DIAGNOSIS — E039 Hypothyroidism, unspecified: Secondary | ICD-10-CM | POA: Diagnosis not present

## 2020-05-12 DIAGNOSIS — Z789 Other specified health status: Secondary | ICD-10-CM | POA: Diagnosis not present

## 2020-05-23 DIAGNOSIS — Z6824 Body mass index (BMI) 24.0-24.9, adult: Secondary | ICD-10-CM | POA: Diagnosis not present

## 2020-05-23 DIAGNOSIS — M797 Fibromyalgia: Secondary | ICD-10-CM | POA: Diagnosis not present

## 2020-05-29 DIAGNOSIS — Z20822 Contact with and (suspected) exposure to covid-19: Secondary | ICD-10-CM | POA: Diagnosis not present

## 2020-06-03 DIAGNOSIS — G5603 Carpal tunnel syndrome, bilateral upper limbs: Secondary | ICD-10-CM | POA: Diagnosis not present

## 2020-06-03 DIAGNOSIS — M13841 Other specified arthritis, right hand: Secondary | ICD-10-CM | POA: Diagnosis not present

## 2020-06-03 DIAGNOSIS — M79641 Pain in right hand: Secondary | ICD-10-CM | POA: Insufficient documentation

## 2020-06-09 HISTORY — PX: CARPAL TUNNEL RELEASE: SHX101

## 2020-07-07 DIAGNOSIS — M19031 Primary osteoarthritis, right wrist: Secondary | ICD-10-CM | POA: Diagnosis not present

## 2020-07-07 DIAGNOSIS — M1811 Unilateral primary osteoarthritis of first carpometacarpal joint, right hand: Secondary | ICD-10-CM | POA: Diagnosis not present

## 2020-07-07 DIAGNOSIS — Z4789 Encounter for other orthopedic aftercare: Secondary | ICD-10-CM | POA: Insufficient documentation

## 2020-07-07 DIAGNOSIS — G8918 Other acute postprocedural pain: Secondary | ICD-10-CM | POA: Diagnosis not present

## 2020-07-07 DIAGNOSIS — G5601 Carpal tunnel syndrome, right upper limb: Secondary | ICD-10-CM | POA: Diagnosis not present

## 2020-07-11 ENCOUNTER — Emergency Department (HOSPITAL_COMMUNITY): Payer: BC Managed Care – PPO

## 2020-07-11 ENCOUNTER — Other Ambulatory Visit: Payer: Self-pay

## 2020-07-11 ENCOUNTER — Encounter (HOSPITAL_COMMUNITY): Payer: Self-pay

## 2020-07-11 ENCOUNTER — Emergency Department (HOSPITAL_COMMUNITY)
Admission: EM | Admit: 2020-07-11 | Discharge: 2020-07-11 | Disposition: A | Discharge status: Home / Self Care | Payer: BC Managed Care – PPO | Attending: Emergency Medicine | Admitting: Emergency Medicine

## 2020-07-11 DIAGNOSIS — R197 Diarrhea, unspecified: Secondary | ICD-10-CM | POA: Diagnosis not present

## 2020-07-11 DIAGNOSIS — J45909 Unspecified asthma, uncomplicated: Secondary | ICD-10-CM | POA: Diagnosis not present

## 2020-07-11 DIAGNOSIS — R000 Tachycardia, unspecified: Secondary | ICD-10-CM | POA: Insufficient documentation

## 2020-07-11 DIAGNOSIS — K529 Noninfective gastroenteritis and colitis, unspecified: Secondary | ICD-10-CM

## 2020-07-11 DIAGNOSIS — R079 Chest pain, unspecified: Secondary | ICD-10-CM | POA: Diagnosis not present

## 2020-07-11 DIAGNOSIS — D259 Leiomyoma of uterus, unspecified: Secondary | ICD-10-CM | POA: Diagnosis not present

## 2020-07-11 DIAGNOSIS — K7689 Other specified diseases of liver: Secondary | ICD-10-CM | POA: Diagnosis not present

## 2020-07-11 DIAGNOSIS — R1012 Left upper quadrant pain: Secondary | ICD-10-CM | POA: Diagnosis not present

## 2020-07-11 LAB — CBC WITH DIFFERENTIAL/PLATELET
Abs Immature Granulocytes: 0.01 10*3/uL (ref 0.00–0.07)
Basophils Absolute: 0 10*3/uL (ref 0.0–0.1)
Basophils Relative: 1 %
Eosinophils Absolute: 0 10*3/uL (ref 0.0–0.5)
Eosinophils Relative: 1 %
HCT: 44.2 % (ref 36.0–46.0)
Hemoglobin: 14.9 g/dL (ref 12.0–15.0)
Immature Granulocytes: 0 %
Lymphocytes Relative: 24 %
Lymphs Abs: 0.9 10*3/uL (ref 0.7–4.0)
MCH: 30.9 pg (ref 26.0–34.0)
MCHC: 33.7 g/dL (ref 30.0–36.0)
MCV: 91.7 fL (ref 80.0–100.0)
Monocytes Absolute: 0.7 10*3/uL (ref 0.1–1.0)
Monocytes Relative: 17 %
Neutro Abs: 2.3 10*3/uL (ref 1.7–7.7)
Neutrophils Relative %: 57 %
Platelets: 329 10*3/uL (ref 150–400)
RBC: 4.82 MIL/uL (ref 3.87–5.11)
RDW: 12.2 % (ref 11.5–15.5)
WBC: 4 10*3/uL (ref 4.0–10.5)
nRBC: 0 % (ref 0.0–0.2)

## 2020-07-11 LAB — TROPONIN I (HIGH SENSITIVITY): Troponin I (High Sensitivity): 3 ng/L (ref <18)

## 2020-07-11 LAB — COMPREHENSIVE METABOLIC PANEL
ALT: 37 U/L (ref 0–44)
AST: 26 U/L (ref 15–41)
Albumin: 3.6 g/dL (ref 3.5–5.0)
Alkaline Phosphatase: 62 U/L (ref 38–126)
Anion gap: 9 (ref 5–15)
BUN: 11 mg/dL (ref 6–20)
CO2: 21 mmol/L — ABNORMAL LOW (ref 22–32)
Calcium: 8.8 mg/dL — ABNORMAL LOW (ref 8.9–10.3)
Chloride: 105 mmol/L (ref 98–111)
Creatinine, Ser: 0.84 mg/dL (ref 0.44–1.00)
GFR, Estimated: >60 mL/min (ref >60)
Glucose, Bld: 105 mg/dL — ABNORMAL HIGH (ref 70–99)
Potassium: 3.8 mmol/L (ref 3.5–5.1)
Sodium: 135 mmol/L (ref 135–145)
Total Bilirubin: 0.7 mg/dL (ref 0.3–1.2)
Total Protein: 6.9 g/dL (ref 6.5–8.1)

## 2020-07-11 LAB — POC OCCULT BLOOD, ED: Fecal Occult Bld: NEGATIVE

## 2020-07-11 MED ORDER — DICYCLOMINE HCL 10 MG PO CAPS
10.0000 mg | ORAL_CAPSULE | Freq: Once | ORAL | Status: AC
  Administered 2020-07-11: 10 mg via ORAL
  Filled 2020-07-11: qty 1

## 2020-07-11 MED ORDER — CIPROFLOXACIN HCL 500 MG PO TABS: 500.0000 mg | ORAL_TABLET | Freq: Two times a day (BID) | ORAL | 0 refills | Status: DC

## 2020-07-11 MED ORDER — METRONIDAZOLE 500 MG PO TABS: 500.0000 mg | ORAL_TABLET | Freq: Two times a day (BID) | ORAL | 0 refills | Status: DC

## 2020-07-11 MED ORDER — CIPROFLOXACIN HCL 500 MG PO TABS: 500.0000 mg | ORAL_TABLET | Freq: Two times a day (BID) | ORAL | 0 refills | Status: AC

## 2020-07-11 MED ORDER — SODIUM CHLORIDE 0.9 % IV BOLUS
1000.0000 mL | Freq: Once | INTRAVENOUS | Status: AC
  Administered 2020-07-11: 1000 mL via INTRAVENOUS

## 2020-07-11 MED ORDER — METRONIDAZOLE 500 MG PO TABS: 500.0000 mg | ORAL_TABLET | Freq: Two times a day (BID) | ORAL | 0 refills | Status: AC

## 2020-07-11 MED ORDER — IOHEXOL 300 MG/ML  SOLN
100.0000 mL | Freq: Once | INTRAMUSCULAR | Status: AC | PRN
  Administered 2020-07-11: 100 mL via INTRAVENOUS

## 2020-07-11 NOTE — Discharge Instructions (Addendum)
Your imaging shows that you have colitis of your intestine, I started you on antibiotics please take as prescribed.  Please beware Flagyl does not react well with alcohol do not consume alcohol while taking this medication.  Ciprofloxacin is known to increase your chances of rupturing a Achilles tendon do not perform aerobics or high intensity activities while taking this medication.  Given you information for foods to help reduce diarrhea please read.  I want you to follow-up with your primary care doctor or GI doctor for further evaluation given you the contact information of GI doctor above please call  Come back to the emergency department if you develop chest pain, shortness of breath, severe abdominal pain, uncontrolled nausea, vomiting, diarrhea.

## 2020-07-11 NOTE — ED Notes (Signed)
Pt tried to produce a urine and BM specimen but was unable

## 2020-07-11 NOTE — ED Notes (Signed)
Pt tried to provide another sample - unable

## 2020-07-11 NOTE — ED Notes (Signed)
Pt given water and crackers  

## 2020-07-11 NOTE — ED Provider Notes (Signed)
[facility] DEPT Provider Note   CSN: [id number] Arrival date & time: 07/11/20  1200     History Chief Complaint  Patient presents with  . Diarrhea    Lindsey [name] is a 55 y.o. female.  HPI   Patient with significant medical history of fibromyalgia, recent carpal tunnel surgery presents with complaint of diarrhea x3 weeks.  She endorses that diarrhea started approximately 3 weeks ago and has progressing gotten worse, she states over the last 2 days she has been having diarrhea every 10 to 20 minutes, she states the diarrhea is foul smelling, a tan color, denies seeing any blood or dark tarry stools.  Diarrhea is is associated with lower abdominal pain, which is achy in nature does not radiate, not associated with nausea, vomiting, urinary symptoms.  She denies recent antibiotic use, has no history of C. difficile, no history of H. pylori or stomach ulcers.  She does endorse that she was in the hospital 3 weeks ago prior to her diarrhea visiting her mother who is being admitted for respiratory failure, and was recently had surgery of the right hand on 03/30 where she was placed on vancomycin, she states she had no diarrhea at the time and flet much better but  Once getting home the diarrhea started up again.  Patient denies any alleviating factors, she denies systemic infection like fevers or chills, cough or nasal congestion, denies general body aches, shortness breath, chest pain,nausea, vomiting, urinary symptoms, worsening pedal edema.  Past Medical History:  Diagnosis Date  . Allergy   . Angio-edema   . Arthritis   . Asthma   . Carpal tunnel syndrome 07/07/2015   right  . Chronic cough 12/02/2015  . Chronic pain syndrome 08/10/2016  . Ehlers-Danlos syndrome   . Essential alopecia of women 04/14/2011  . Family history of breast cancer in female 08/26/2015   (x2) paternal 1st cousins dx in their 26s   . Fibrocystic breast changes of both breasts 07/19/2015   . Fibromyalgia   . Hypermobility syndrome 11/26/2014   Based on Fam Hx and Beighton Score of 7 this is likely ED III   . Hypertrophy of vulva 07/20/2017  . Inflammatory dermatosis 04/14/2011  . Irritable larynx syndrome 12/02/2015  . Lumbar radiculopathy 02/16/2016  . Osteoarthritis 04/01/2017  . Osteopenia   . Primary arthrosis of first carpometacarpal joints, bilateral 06/01/2015  . Sacroiliac joint dysfunction of right side 11/26/2014   B SI joint xrays last done at [facility] Rheumat[facility] Dr. Kathlene November 03/13/2017    Patient Active Problem List   Diagnosis Date Noted  . Non-restorative sleep 01/14/2020  . Menopausal and female climacteric states 01/14/2020  . Insomnia due to medical condition 01/14/2020  . Hypertrophy of vulva 07/20/2017  . Osteoarthritis 04/01/2017  . Chronic pain syndrome 08/10/2016  . Fibromyalgia 08/10/2016  . Lumbar radiculopathy 02/16/2016  . Chronic cough 12/02/2015  . Irritable larynx syndrome 12/02/2015  . Genetic testing 09/21/2015  . Family history of breast cancer in female 08/26/2015  . Fibrocystic breast changes of both breasts 07/19/2015  . Carpal tunnel syndrome 07/07/2015  . Primary arthrosis of first carpometacarpal joints, bilateral 06/01/2015  . Ehlers-Danlos syndrome 05/11/2015  . Sacroiliac joint dysfunction of right side 11/26/2014  . Hypermobility syndrome 11/26/2014  . Essential alopecia of women 04/14/2011  . Inflammatory dermatosis 04/14/2011    Past Surgical History:  Procedure Laterality Date  . BREAST EXCISIONAL BIOPSY Left 01/2019   atypical lobular hyperplasia  . DENTAL SURGERY    .  FRACTURE SURGERY     as child  . RADIOACTIVE SEED GUIDED EXCISIONAL BREAST BIOPSY Left 02/05/2019   Procedure: RADIOACTIVE SEED GUIDED EXCISIONAL LEFT BREAST BIOPSY;  Surgeon: Rolm Bookbinder, MD;  Location: [facility];  Service: General;  Laterality: Left;     OB History   No obstetric history on file.     Family History  Problem  Relation Age of Onset  . Hypertension Mother   . Other Mother        hx of hysterectomy at 26-56 for prolapsed uterus; hx of benign L arm cyst in her 67s  . Stroke Father   . Hypertension Father   . Skin cancer Father 21       NOS  . Other Father        enlarged prostate s/p surgery  . Alzheimer's disease Maternal Aunt   . 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 Congestive Heart Failure Maternal Grandmother   . Prostate cancer Maternal Grandfather        dx. 75-76  . Heart Problems Paternal Grandmother   . Heart Problems Paternal Grandfather   . Colon cancer Other        maternal great aunt (MGM's sister) dx in her 57s  . Heart attack Paternal Uncle 75  . Heart Problems Paternal Uncle   . Angina Paternal Uncle   . Breast cancer Cousin        paternal 1st cousin d. 54  . Breast cancer Cousin        paternal 1st cousin dx. 55s  . Colon polyps Neg Hx   . Esophageal cancer Neg Hx   . Rectal cancer Neg Hx     Social History   Tobacco Use  . Smoking status: Never Smoker  . Smokeless tobacco: Never Used  Vaping Use  . Vaping Use: Never used  Substance Use Topics  . Alcohol use: No    Alcohol/week: 0.0 standard drinks  . Drug use: No    Home Medications Prior to Admission medications   Medication Sig Start Date End Date Taking? Authorizing Provider  ciprofloxacin (CIPRO) 500 MG tablet Take 1 tablet (500 mg total) by mouth 2 (two) times daily for 5 days. 07/11/20 07/16/20 Yes Marcello Fennel, PA-C  metroNIDAZOLE (FLAGYL) 500 MG tablet Take 1 tablet (500 mg total) by mouth 2 (two) times daily for 5 days. 07/11/20 07/16/20 Yes Marcello Fennel, PA-C  Acetaminophen (TYLENOL 8 HOUR ARTHRITIS PAIN PO) Take 1,300 mg by mouth in the morning and at bedtime.    [provider]  Albuterol Sulfate (PROAIR RESPICLICK) 469 (90 Base) MCG/ACT AEPB Inhale into the lungs as needed.    [provider]  Ascorbic Acid (VITAMIN C PO) Take 1,200 mg by mouth 2 (two) times daily.     [provider]  CHITOSAN PO Take 1,000 mg by mouth in the morning and at bedtime.    [provider]  Cholecalciferol (VITAMIN D3) 1.25 MG (50000 UT) TABS Take 1 tablet by mouth once a week.    [provider]  clonazePAM (KLONOPIN) 0.5 MG tablet Take 1 tablet (0.5 mg total) by mouth at bedtime. 02/24/20   Dohmeier, Asencion Partridge, MD  cyclobenzaprine (FLEXERIL) 10 MG tablet cyclobenzaprine 10 mg tablet    [provider]  cycloSPORINE (RESTASIS) 0.05 % ophthalmic emulsion Place  1 drop into both eyes 2 (two) times daily.    [provider]  diphenhydrAMINE HCl (BENADRYL ALLERGY PO) 40 mg at bedtime.    [provider]  FERROUS BISGLYCINATE CHELATE PO Take 18 mg by mouth daily.    [provider]  fexofenadine (ALLEGRA ALLERGY) 180 MG tablet Take 180 mg by mouth daily.    [provider]  folic acid (FOLVITE) 751 MCG tablet Take 400 mcg by mouth daily.    [provider]  gabapentin (NEURONTIN) 300 MG capsule Take 300 mg by mouth at bedtime. 04/17/14   [provider]  hyoscyamine (LEVBID) 0.375 MG 12 hr tablet Take by mouth. 11/03/17   [provider]  ipratropium (ATROVENT) 0.06 % nasal spray Place 2 sprays into both nostrils 2 (two) times daily as needed.    [provider]  LEVOCETIRIZINE DIHYDROCHLORIDE PO Take 5 mg by mouth at bedtime.    [provider]  MAGNESIUM MALATE PO Take 450 mg by mouth in the morning and at bedtime.    [provider]  MELATONIN PO Take 30 mg by mouth at bedtime.    [provider]  Menaquinone-7 (VITAMIN K2 PO) Take 150 mcg by mouth every morning.    [provider]  montelukast (SINGULAIR) 10 MG tablet Take 1 tablet by mouth at bedtime. 11/02/17   [provider]  NALTREXONE HCL PO Take by mouth.    [provider]  OVER  THE COUNTER MEDICATION 306 mg every morning. Estrogen control    [provider]  OVER THE COUNTER MEDICATION 2 capsules every morning. MTHFR Endure (RIH)    [provider]  PHOSPHATIDYLSERINE PO 200 mg every morning.    [provider]  Pregnenolone Micronized (PREGNENOLONE PO) Take 75 mg by mouth 3 (three) times a week.    [provider]  sertraline (ZOLOFT) 25 MG tablet TAKE 1 TABLET BY MOUTH EVERY DAY 02/10/20   Dohmeier, Asencion Partridge, MD  Testosterone 75 MG PLLT 75 mg every 3 (three) months. Insert 1 pellet every 3 months    [provider]    Allergies    Poison oak extract [poison oak extract], Xiidra [lifitegrast], Penicillins, Sulfa antibiotics, and Sulfamethoxazole-trimethoprim  Review of Systems   Review of Systems  Constitutional: Positive for chills and fever.  HENT: Negative for congestion and sore throat.   Respiratory: Positive for shortness of breath.   Cardiovascular: Positive for chest pain.  Gastrointestinal: Positive for abdominal pain and diarrhea. Negative for blood in stool, nausea and vomiting.  Genitourinary: Negative for enuresis and flank pain.  Musculoskeletal: Negative for back pain.  Skin: Negative for rash.  Neurological: Positive for weakness. Negative for dizziness.  Hematological: Does not bruise/bleed easily.    Physical Exam Updated Vital Signs BP 117/84   Pulse 94   Temp 97.9 F (36.6 C) (Oral)   Resp 18   LMP 05/09/2014   SpO2 98%   Physical Exam Vitals and nursing note reviewed.  Constitutional:      General: She is not in acute distress.    Appearance: She is not ill-appearing.  HENT:     Head: Normocephalic and atraumatic.     Nose: No congestion.  Eyes:     Conjunctiva/sclera: Conjunctivae normal.  Cardiovascular:     Rate and Rhythm: Regular rhythm. Tachycardia present.     Pulses: Normal pulses.     Heart sounds: No murmur heard. No friction rub. No gallop.   Pulmonary:  Effort:  No respiratory distress.     Breath sounds: No wheezing, rhonchi or rales.  Abdominal:     Palpations: Abdomen is soft.     Tenderness: There is abdominal tenderness. There is no right CVA tenderness or left CVA tenderness.     Comments: Patient's abdomen was visualized it was nondistended, normoactive bowel sounds, dull to percussion.  She had notable tenderness in her lower abdomen, negative McBurney point rebound tenderness, peritoneal sign, no CVA tenderness present.  Musculoskeletal:     Right lower leg: No edema.     Left lower leg: No edema.  Skin:    General: Skin is warm and dry.  Neurological:     Mental Status: She is alert.  Psychiatric:        Mood and Affect: Mood normal.     ED Results / Procedures / Treatments   Labs (all labs ordered are listed, but only abnormal results are displayed) Labs Reviewed  COMPREHENSIVE METABOLIC PANEL - Abnormal; Notable for the following components:      Result Value   CO2 21 (*)    Glucose, Bld 105 (*)    Calcium 8.8 (*)    All other components within normal limits  C DIFFICILE QUICK SCREEN W PCR REFLEX  CBC WITH DIFFERENTIAL/PLATELET  URINALYSIS, ROUTINE W REFLEX MICROSCOPIC  H. PYLORI ANTIBODY, IGG  POC OCCULT BLOOD, ED  TROPONIN I (HIGH SENSITIVITY)  TROPONIN I (HIGH SENSITIVITY)    EKG None  Radiology CT Abdomen Pelvis W Contrast  Result Date: 07/11/2020 CLINICAL DATA:  Left upper quadrant pain. Diarrhea 3 weeks on antibiotics. EXAM: CT ABDOMEN AND PELVIS WITH CONTRAST TECHNIQUE: Multidetector CT imaging of the abdomen and pelvis was performed using the standard protocol following bolus administration of intravenous contrast. CONTRAST:  154m OMNIPAQUE IOHEXOL 300 MG/ML  SOLN COMPARISON:  MRI abdomen 06/12/2014 FINDINGS: Lower chest: Lung bases demonstrate minimal scarring/atelectasis over the right middle lobe and lingula. Hepatobiliary: There are several scattered hypodensities within the liver with the largest over the  posterior right lobe measuring 2.1 cm compatible with known cysts. Possible subtle amount of gallstones. Biliary tree is normal. Pancreas: Normal. Spleen: Normal. Adrenals/Urinary Tract: Adrenal glands are normal. Kidneys normal size without hydronephrosis or nephrolithiasis. Ureters and bladder are normal. Stomach/Bowel: Stomach and small bowel are normal. Appendix is not well visualized. There is mild wall thickening/submucosal edema throughout the colon mostly distal to the a Paddock flexure suggesting mild acute colitis which may be infectious or inflammatory nature. Vascular/Lymphatic: Minimal calcified plaque over the abdominal aorta which is normal in caliber. Remaining vascular structures are normal. No adenopathy. Reproductive: Findings suggesting a 4.5 cm fibroid over the uterine fundus. Ovaries unremarkable. Other: No free fluid or focal inflammatory change. Musculoskeletal: No focal abnormality. IMPRESSION: 1. Mild wall thickening/submucosal edema throughout the colon mostly distal to the hepatic flexure suggesting a mild acute colitis which may be of infectious or inflammatory nature. 2. Several stable liver cysts. 3. Possible subtle cholelithiasis. 4. 4.5 cm uterine fibroid. 5. Aortic atherosclerosis. Aortic Atherosclerosis (ICD10-I70.0). Electronically Signed   By: DMarin OlpM.D.   On: 07/11/2020 16:53    Procedures Procedures   Medications Ordered in ED Medications  sodium chloride 0.9 % bolus 1,000 mL (0 mLs Intravenous Stopped 07/11/20 1736)  dicyclomine (BENTYL) capsule 10 mg (10 mg Oral Given 07/11/20 1422)  iohexol (OMNIPAQUE) 300 MG/ML solution 100 mL (100 mLs Intravenous Contrast Given 07/11/20 1624)    ED Course  I have reviewed the triage vital  signs and the nursing notes.  Pertinent labs & imaging results that were available during my care of the patient were reviewed by me and considered in my medical decision making (see chart for details).    MDM Rules/Calculators/A&P                          Initial impression-patient presents with diarrhea x3 weeks, she is alert, does not appear in acute distress, vital signs show tachycardia.  Concern for possible C. difficile, will obtain basic lab work, C. difficile panel, H. pylori, start patient on fluids and Bentyl and reassess.  Work-up-CBC unremarkable, CMP shows slight decrease in CO2 21, slight hyperglycemia of 105.  First troponin is 3.  Hemoccult negative.  Reassessment patient reassessed after finding her with fluids and Bentyl, states she is feeling better, has no complaints at this time.  Vital signs have remained stable.  Fortunately patient has been unable to have a bowel movement and cannot obtain C. difficile cultures.  Rule out-I have low suspicion for systemic infection as patient is nontoxic-appearing, vital signs reassuring.  Low suspicion for gallbladder or liver abnormality as no elevation in alk phos or liver enzymes, no right upper quadrant pain noted.  Low suspicion for ruptured stomach ulcer as abdomen is nondistended, dull percussion, no peritoneal sign noted.  Low suspicion for diverticulitis as there is no leukocytosis noted on CBC, CT imaging negative for diverticulitis.  I have low suspicion for C. difficile as patient had no recent exposure to clindamycin or ciprofloxacin, stool is nonconsistent with C. difficile etiology, patient did not have any bouts of diarrhea during her stay.  Plan-I suspect patient's diarrhea may be secondary due to colitis, unsure if this is viral versus infectious will start her on antibiotics have her follow-up with PCP or GI for further evaluation.  Vital signs have remained stable, no indication for hospital admission.  Patient discussed with attending and they agreed with assessment and plan.  Patient given at home care as well strict return precautions.  Patient verbalized that they understood agreed to said plan.   Final Clinical Impression(s) / ED Diagnoses Final  diagnoses:  Diarrhea, unspecified type  Colitis    Rx / DC Orders ED Discharge Orders         Ordered    ciprofloxacin (CIPRO) 500 MG tablet  2 times daily        07/11/20 1800    metroNIDAZOLE (FLAGYL) 500 MG tablet  2 times daily        07/11/20 1800           Aron Baba 07/11/20 1809    Lucrezia Starch, MD 07/13/20 [id number]

## 2020-07-11 NOTE — ED Triage Notes (Signed)
Pt reports diarrhea x3 weeks. Pt reports having surgery on 3/29 and received vancomycin while there and the diarrhea stopped while receiving that. Diarrhea started back after discharge. Pt reports diarrhea every 10-20 minutes.

## 2020-07-13 LAB — H. PYLORI ANTIBODY, IGG: H Pylori IgG: 0.24 Index Value (ref 0.00–0.79)

## 2020-07-15 DIAGNOSIS — G5601 Carpal tunnel syndrome, right upper limb: Secondary | ICD-10-CM | POA: Diagnosis not present

## 2020-07-15 DIAGNOSIS — M1811 Unilateral primary osteoarthritis of first carpometacarpal joint, right hand: Secondary | ICD-10-CM | POA: Diagnosis not present

## 2020-07-20 ENCOUNTER — Encounter: Payer: Self-pay | Admitting: Nurse Practitioner

## 2020-07-20 ENCOUNTER — Other Ambulatory Visit: Payer: BC Managed Care – PPO

## 2020-07-20 ENCOUNTER — Ambulatory Visit: Payer: BC Managed Care – PPO | Admitting: Nurse Practitioner

## 2020-07-20 VITALS — BP 108/70 | HR 72 | Ht 65.75 in | Wt 142.4 lb

## 2020-07-20 DIAGNOSIS — R9389 Abnormal findings on diagnostic imaging of other specified body structures: Secondary | ICD-10-CM | POA: Diagnosis not present

## 2020-07-20 DIAGNOSIS — R197 Diarrhea, unspecified: Secondary | ICD-10-CM | POA: Diagnosis not present

## 2020-07-20 MED ORDER — DICYCLOMINE HCL 10 MG PO CAPS: ORAL_CAPSULE | ORAL | 0 refills | Status: DC

## 2020-07-20 NOTE — Patient Instructions (Addendum)
If you are age 53 or older, your body mass index should be between 23-30. Your Body mass index is 23.16 kg/m. If this is out of the aforementioned range listed, please consider follow up with your Primary Care Provider.  If you are age 74 or younger, your body mass index should be between 19-25. Your Body mass index is 23.16 kg/m. If this is out of the aformentioned range listed, please consider follow up with your Primary Care Provider.   Your provider has requested that you go to the basement level for lab work before leaving today. Press "B" on the elevator. The lab is located at the first door on the left as you exit the elevator.  Once we get the results of your GI pathogen results you can start Imodium 2 mg as directed.  We will call you with the results of your labs and how we will proceed with your Colonoscopy.  Follow up pending at this time.  Thank you for entrusting me with your care and choosing [facility].  Tye Savoy, NP-C

## 2020-07-20 NOTE — Progress Notes (Signed)
ASSESSMENT AND PLAN    # 55 year old female with acute diarrhea, colon wall thickening on CT scan.  No improvement,  in fact she had worsening of diarrhea on Cipro and Flagyl prescribed by ED 07/12/2018.  She is nearly out of antibiotics, no need to refill those for now --Obtain stool studies. --If stool studies are negative then will arrange for colonoscopy. The risks and benefits of colonoscopy with possible polypectomy / biopsies were discussed and the patient agrees to proceed.  --If stool studies suggest noninfectious source of diarrhea then patient can take Imodium 2 mg as directed.  She will need to hold the Imodium 10 days prior to colonoscopy --Trial of Bentyl prn cramps   HISTORY OF PRESENT ILLNESS     Chief Complaint : Diarrhea  Lindsey [name] is a 55 y.o. female with past medical history significant for fibromyalgia, carpal tunnel syndrome, arthritis, Ehlers-Danlos syndrome, IBS, asthma  Patient is new to the practice, here for evaluation of diarrhea.    ED 07/11/20 visit - evaluation of crampy diarrhea.  She had been having multiple daily episodes of loose, malodorous, non-bloody diarrhea for 3 weeks.  Some of the diarrheal episodes are nocturnal and associated with incontinence.  In the ED her WBC, hgb, renal function and liver chemistries were all normal .  CT scan showed mild wall thickening throughout the colon mostly distal to the hepatic flexure suggestive of infectious or inflammatory process.  Stool studies were not collected.  Patient was discharged from the ED on Cipro and Flagyl.   Patient's mother had been in the hospital a few weeks prior to the onset of diarrhea so patient was worried about C. difficile.  Patient had surgery on her right arm 07/08/20.  Perioperatively she took vancomycin but no other antibiotic use prior to the onset of diarrhea.  Prior to the onset of diarrhea patient's bowel movements were normal.  She has no known family history of IBD . The  diarrhea got worse with Cipro and Flagyl ( completed 10 days) prescribed by ED. She has not tried any OTC antidiarrheal agents.  She is drinking plenty of fluids to stay hydrated  Patient is followed at [facility] in [place]. She takes several vitamins/supplements including oral iron.  Patient denies history of iron deficiency anemia.   Patient has never had a colonoscopy or other form of colon cancer screening.  No blood in stool.  No family history of colon cancer  Data Reviewed: 07/11/2020 CT scan abdomen and pelvis with contrast 1. Mild wall thickening/submucosal edema throughout the colon mostly distal to the hepatic flexure suggesting a mild acute colitis which may be of infectious or inflammatory nature. 2. Several stable liver cysts. 3. Possible subtle cholelithiasis. 4. 4.5 cm uterine fibroid. 5. Aortic atherosclerosis  PREVIOUS EVALUATIONS:    Past Medical History:  Diagnosis Date  . Allergy   . Angio-edema   . Arthritis   . Asthma   . Carpal tunnel syndrome 07/07/2015   right  . Chronic cough 12/02/2015  . Chronic pain syndrome 08/10/2016  . Ehlers-Danlos syndrome   . Essential alopecia of women 04/14/2011  . Family history of breast cancer in female 08/26/2015   (x2) paternal 1st cousins dx in their 5s   . Fibrocystic breast changes of both breasts 07/19/2015  . Fibromyalgia   . HLD (hyperlipidemia)   . Hypermobility syndrome 11/26/2014   Based on Fam Hx and Beighton Score of 7 this is likely ED III   .  Hypertrophy of vulva 07/20/2017  . Inflammatory dermatosis 04/14/2011  . Irritable larynx syndrome 12/02/2015  . Lumbar radiculopathy 02/16/2016  . Osteoarthritis 04/01/2017  . Osteopenia   . Primary arthrosis of first carpometacarpal joints, bilateral 06/01/2015  . Sacroiliac joint dysfunction of right side 11/26/2014   B SI joint xrays last done at [facility] Dr. Kathlene November 03/13/2017  . Skin cancer      Past Surgical History:  Procedure Laterality Date  .  BREAST EXCISIONAL BIOPSY Left 01/2019   atypical lobular hyperplasia  . CARPAL TUNNEL RELEASE Right 06/2020  . DENTAL SURGERY    . ELBOW FRACTURE SURGERY Left    as child  . RADIOACTIVE SEED GUIDED EXCISIONAL BREAST BIOPSY Left 02/05/2019   Procedure: RADIOACTIVE SEED GUIDED EXCISIONAL LEFT BREAST BIOPSY;  Surgeon: Rolm Bookbinder, MD;  Location: [facility];  Service: General;  Laterality: Left;   Family History  Problem Relation Age of Onset  . Hypertension Mother   . Other Mother        hx of hysterectomy at 71-56 for prolapsed uterus; hx of benign L arm cyst in her 44s  . Stroke Father   . Hypertension Father   . Skin cancer Father 58       NOS  . Other Father        enlarged prostate s/p surgery  . Alzheimer's disease Maternal Aunt   . Stomach cancer Paternal Aunt        d. 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 Congestive Heart Failure Maternal Grandmother   . Prostate cancer Maternal Grandfather        dx. 75-76  . Heart Problems Paternal Grandmother   . Heart Problems Paternal Grandfather   . Colon cancer Other        maternal great aunt (MGM's sister) dx in her 74s  . Heart attack Paternal Uncle 62  . Heart Problems Paternal Uncle   . Angina Paternal Uncle   . Breast cancer Cousin        paternal 1st cousin d. 38  . Breast cancer Cousin        paternal 1st cousin dx. 45s  . Colon polyps Neg Hx   . Esophageal cancer Neg Hx   . Rectal cancer Neg Hx    Social History   Tobacco Use  . Smoking status: Never Smoker  . Smokeless tobacco: Never Used  Vaping Use  . Vaping Use: Never used  Substance Use Topics  . Alcohol use: No    Alcohol/week: 0.0 standard drinks  . Drug use: No   Current Outpatient Medications  Medication Sig Dispense Refill  . Acetaminophen (TYLENOL 8 HOUR ARTHRITIS PAIN PO) Take 1,300 mg by mouth in the morning and at bedtime.    . Albuterol Sulfate (PROAIR RESPICLICK) 382  (90 Base) MCG/ACT AEPB Inhale into the lungs as needed.    . Ascorbic Acid (VITAMIN C PO) Take 1,200 mg by mouth 2 (two) times daily.     . CHITOSAN PO Take 1,000 mg by mouth in the morning and at bedtime.    . Cholecalciferol (VITAMIN D3) 1.25 MG (50000 UT) TABS Take 1 tablet by mouth once a week.    . ciprofloxacin (CIPRO) 500 MG tablet Take 500 mg by mouth 2 (two) times daily.    . clonazePAM (KLONOPIN) 0.5 MG tablet Take 1 tablet (0.5 mg total) by mouth at bedtime. 30 tablet 5  .  cycloSPORINE (RESTASIS) 0.05 % ophthalmic emulsion Place 1 drop into both eyes 2 (two) times daily.    Marland Kitchen estradiol (VIVELLE-DOT) 0.05 MG/24HR patch APPLY 1 PATCH ONCE EVERY 3 DAYS    . FERROUS BISGLYCINATE CHELATE PO Take 18 mg by mouth daily.    . fexofenadine (ALLEGRA) 180 MG tablet Take 180 mg by mouth daily.    . folic acid (FOLVITE) 841 MCG tablet Take 800 mcg by mouth daily.    Marland Kitchen gabapentin (NEURONTIN) 300 MG capsule Take 300 mg by mouth at bedtime.    . hyoscyamine (LEVBID) 0.375 MG 12 hr tablet Take 0.375 mg by mouth 2 (two) times daily.    Marland Kitchen ipratropium (ATROVENT) 0.06 % nasal spray Place 2 sprays into both nostrils 2 (two) times daily as needed.    Marland Kitchen LEVOCETIRIZINE DIHYDROCHLORIDE PO Take 5 mg by mouth at bedtime.    Marland Kitchen MAGNESIUM MALATE PO Take 450 mg by mouth in the morning and at bedtime.    Marland Kitchen MELATONIN PO Take 30 mg by mouth at bedtime.    . Menaquinone-7 (VITAMIN K2 PO) Take 150 mcg by mouth every morning.    . metroNIDAZOLE (FLAGYL) 500 MG tablet Take 500 mg by mouth 2 (two) times daily.    . montelukast (SINGULAIR) 10 MG tablet Take 1 tablet by mouth at bedtime.    . mupirocin ointment (BACTROBAN) 2 % Apply 1 application topically 3 (three) times daily as needed.    Marland Kitchen NALTREXONE HCL PO Take 6 mg by mouth at bedtime.    Marland Kitchen OVER THE COUNTER MEDICATION 306 mg every morning. Estrogen control    . OVER THE COUNTER MEDICATION 2 capsules every morning. MTHFR Endure (RIH)    . oxyCODONE (OXY IR/ROXICODONE)  5 MG immediate release tablet oxycodone 5 mg tablet  TAKE 1 TABLET EVERY 4-6 HOURS AS NEEDED FOR PAIN FOR 7 DAYS.    Marland Kitchen PHOSPHATIDYLSERINE PO 200 mg every morning.    . Pregnenolone Micronized (PREGNENOLONE PO) Take 75 mg by mouth 3 (three) times a week.    . progesterone (PROMETRIUM) 200 MG capsule progesterone micronized 200 mg capsule  TAKE 3 CAPSULES EVERY NIGHT    . sertraline (ZOLOFT) 25 MG tablet TAKE 1 TABLET BY MOUTH EVERY DAY 90 tablet 2  . Testosterone 75 MG PLLT 75 mg every 3 (three) months. Insert 1 pellet every 3 months    . traZODone (DESYREL) 100 MG tablet trazodone 100 mg tablet  TAKE 1-2 TABLET BY MOUTH AT BEDTIME     No current facility-administered medications for this visit.   Allergies  Allergen Reactions  . Poison Oak Extract [Poison Oak Extract] Anaphylaxis  . Xiidra [Lifitegrast] Swelling    Eye swelling and blurred vision.   Marland Kitchen Penicillins Hives  . Sulfa Antibiotics Nausea And Vomiting  . Sulfamethoxazole-Trimethoprim      Review of Systems:  All systems reviewed and negative except where noted in HPI.   PHYSICAL EXAM :    Wt Readings from Last 3 Encounters:  07/20/20 142 lb 6 oz (64.6 kg)  01/14/20 145 lb (65.8 kg)  01/29/19 138 lb (62.6 kg)    BP 108/70 (BP Location: Left Arm, Patient Position: Sitting, Cuff Size: Normal)   Pulse 72   Ht 5' 5.75" (1.67 m) Comment: height measured without shoes  Wt 142 lb 6 oz (64.6 kg)   LMP 05/09/2014   BMI 23.16 kg/m  Constitutional:  Pleasant well developed female in no acute distress. Psychiatric: Normal mood and affect. Behavior  is normal. EENT: Pupils normal.  Conjunctivae are normal. No scleral icterus. Neck supple.  Cardiovascular: Normal rate, regular rhythm. No edema Pulmonary/chest: Effort normal and breath sounds normal. No wheezing, rales or rhonchi. Abdominal: Soft, nondistended, nontender. Bowel sounds active throughout. There are no masses palpable. No hepatomegaly. Neurological: Alert and  oriented to person place and time. Skin: Skin is warm and dry. No rashes noted.  Tye Savoy, NP  07/20/2020, 3:12 PM

## 2020-07-21 ENCOUNTER — Encounter: Payer: Self-pay | Admitting: Nurse Practitioner

## 2020-07-22 ENCOUNTER — Telehealth: Payer: Self-pay | Admitting: Nurse Practitioner

## 2020-07-22 NOTE — Telephone Encounter (Signed)
Thanks.  Beth I am off tomorrow and the office is closed on Friday.  Will you keep an eye out for her stool study results tomorrow.  If stool studies are negative then please schedule her for colonoscopy for evaluation of diarrhea and colitis on CT scan.Eustaquio Maize I cannot remember which GI she was assigned to but if they do not have any sooner openings then please see who does .   This patient is pretty miserable

## 2020-07-22 NOTE — Telephone Encounter (Signed)
Lindsey [name] The stool study is not back yet. It may be back tomorrow if it processes quickly. She understands supportive measurements such as the Bentyl and hydration. 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 She will be there for the holiday weekend. I have included the pharmacy near where she will be if the stool study returns Friday or the weekend.

## 2020-07-22 NOTE — Telephone Encounter (Signed)
Pt called inquiring about stool test results. She stated that she is going out of town in two days and would like to have a treatment plan before that . Pls call her.

## 2020-07-23 LAB — GI PROFILE, STOOL, PCR

## 2020-07-27 NOTE — Telephone Encounter (Signed)
Patient has not seen any of our doctors. The office visit encounter was reviewed by Dr Bryan Lemma.   Dr Bryan Lemma Would you be okay with the colonoscopy being done by Dr Hilarie Fredrickson?

## 2020-07-27 NOTE — Telephone Encounter (Signed)
I am ok to have her added for colon if I have sooner appts.   I do have an appt this Wednesday, 07/29/20, at 9 AM due to a cancellation Thanks JMP

## 2020-07-27 NOTE — Telephone Encounter (Signed)
Called patient to offer the earlier appointment. No answer. Left a message for her to call me back.

## 2020-07-29 NOTE — Telephone Encounter (Signed)
She has read my message that was sent to her through My Chart with dates and times.  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 She had to go to her the day after we spoke on the phone. She explained to me that as much as she wants to have her health attended to, she is unable to do so until her parent is taken care of. I gave her a couple of ways to contact me.

## 2020-08-04 NOTE — Progress Notes (Signed)
Agree with the assessment and plan as outlined by Paula Guenther, NP. ° °Amritha Yorke, DO, FACG ° °

## 2020-08-05 DIAGNOSIS — M1811 Unilateral primary osteoarthritis of first carpometacarpal joint, right hand: Secondary | ICD-10-CM | POA: Diagnosis not present

## 2020-08-05 DIAGNOSIS — G5601 Carpal tunnel syndrome, right upper limb: Secondary | ICD-10-CM | POA: Diagnosis not present

## 2020-08-11 ENCOUNTER — Encounter: Payer: BC Managed Care – PPO | Admitting: Gastroenterology

## 2020-08-14 ENCOUNTER — Encounter: Payer: BC Managed Care – PPO | Admitting: Internal Medicine

## 2020-08-17 ENCOUNTER — Other Ambulatory Visit: Payer: Self-pay | Admitting: General Surgery

## 2020-08-17 DIAGNOSIS — N6092 Unspecified benign mammary dysplasia of left breast: Secondary | ICD-10-CM

## 2020-08-19 DIAGNOSIS — M25641 Stiffness of right hand, not elsewhere classified: Secondary | ICD-10-CM | POA: Diagnosis not present

## 2020-08-19 DIAGNOSIS — M1811 Unilateral primary osteoarthritis of first carpometacarpal joint, right hand: Secondary | ICD-10-CM | POA: Diagnosis not present

## 2020-08-27 ENCOUNTER — Other Ambulatory Visit: Payer: Self-pay | Admitting: Neurology

## 2020-08-28 DIAGNOSIS — M25641 Stiffness of right hand, not elsewhere classified: Secondary | ICD-10-CM | POA: Diagnosis not present

## 2020-09-04 DIAGNOSIS — M25641 Stiffness of right hand, not elsewhere classified: Secondary | ICD-10-CM | POA: Diagnosis not present

## 2020-09-10 ENCOUNTER — Telehealth: Payer: Self-pay | Admitting: Nurse Practitioner

## 2020-09-10 DIAGNOSIS — M1811 Unilateral primary osteoarthritis of first carpometacarpal joint, right hand: Secondary | ICD-10-CM | POA: Diagnosis not present

## 2020-09-10 DIAGNOSIS — M25641 Stiffness of right hand, not elsewhere classified: Secondary | ICD-10-CM | POA: Diagnosis not present

## 2020-09-10 NOTE — Telephone Encounter (Signed)
Patient calling to inform she is experiencing diarrhea.  Pt wants to know how to treat sxs. Pt wants to discuss colon appt scheduling.. Plz advise

## 2020-09-11 NOTE — Telephone Encounter (Addendum)
Called the patient back. No answer. She had to cancel her procedures that were scheduled earlier due to planned surgery. She was to have a colonoscopy with Dr Tarri Glenn for diarrhea.

## 2020-09-18 DIAGNOSIS — M25641 Stiffness of right hand, not elsewhere classified: Secondary | ICD-10-CM | POA: Diagnosis not present

## 2020-09-21 NOTE — Telephone Encounter (Signed)
Inbound call from patient. Was calling in about colonoscopy. Has an appointment 7/1 with Nevin Bloodgood. Would like a call back [phone]

## 2020-09-21 NOTE — Telephone Encounter (Signed)
She is calling to reschedule the colonoscopy. She is still having  periodic diarrhea. Can she be scheduled as a direct colon?

## 2020-09-22 ENCOUNTER — Other Ambulatory Visit: Payer: Self-pay

## 2020-09-22 ENCOUNTER — Ambulatory Visit
Admission: RE | Admit: 2020-09-22 | Discharge: 2020-09-22 | Disposition: A | Discharge status: Home / Self Care | Payer: BC Managed Care – PPO | Source: Ambulatory Visit | Attending: General Surgery | Admitting: General Surgery

## 2020-09-22 DIAGNOSIS — M25641 Stiffness of right hand, not elsewhere classified: Secondary | ICD-10-CM | POA: Diagnosis not present

## 2020-09-22 DIAGNOSIS — N6092 Unspecified benign mammary dysplasia of left breast: Secondary | ICD-10-CM

## 2020-09-22 NOTE — Telephone Encounter (Signed)
Called the patient to schedule the colonoscopy. No answer. Left her a message that it is okay to schedule the appointment. Asked that she call back and we will get this taken care of.

## 2020-09-22 NOTE — Telephone Encounter (Signed)
Based on previous plan for colonoscopy to evaluate for diarrhea and abnormal CT, ok to schedule for colonoscopy now rather than waiting for f/u appt. Can still keep appt as scheduled in office, but ok to also schedule colonoscopy now. Thanks.

## 2020-09-25 NOTE — Telephone Encounter (Signed)
Spoke with the patient. Scheduled the colonoscopy with her. Moved the follow up appointment to a week after the colonoscopy.

## 2020-10-05 ENCOUNTER — Other Ambulatory Visit: Payer: Self-pay | Admitting: Gastroenterology

## 2020-10-05 ENCOUNTER — Other Ambulatory Visit: Payer: Self-pay

## 2020-10-05 ENCOUNTER — Ambulatory Visit (AMBULATORY_SURGERY_CENTER): Payer: BC Managed Care – PPO | Admitting: *Deleted

## 2020-10-05 VITALS — Ht 65.75 in | Wt 142.0 lb

## 2020-10-05 DIAGNOSIS — R9389 Abnormal findings on diagnostic imaging of other specified body structures: Secondary | ICD-10-CM

## 2020-10-05 DIAGNOSIS — R197 Diarrhea, unspecified: Secondary | ICD-10-CM

## 2020-10-05 MED ORDER — PEG-KCL-NACL-NASULF-NA ASC-C 100 G PO SOLR
1.0000 | Freq: Once | ORAL | 0 refills | Status: AC
Start: 2020-10-05 — End: 2020-10-05

## 2020-10-05 NOTE — Progress Notes (Signed)
Patient's pre-visit was done today over the phone with the patient due to COVID-19 pandemic. Name,DOB and address verified. Insurance verified. Patient denies any allergies to Eggs and Soy. Patient denies any problems with anesthesia/sedation. Patient denies taking diet pills or blood thinners. Packet of Prep instructions mailed to patient including a copy of a consent form-pt is aware. Patient understands to call us back with any questions or concerns. Patient is aware of our care-partner policy and [id number] safety protocol.   EMMI education assigned to the patient for the procedure, sent to [facility].   The patient is COVID-19 vaccinated, per patient.

## 2020-10-09 ENCOUNTER — Ambulatory Visit: Payer: BC Managed Care – PPO | Admitting: Nurse Practitioner

## 2020-10-19 DIAGNOSIS — N951 Menopausal and female climacteric states: Secondary | ICD-10-CM | POA: Diagnosis not present

## 2020-10-27 ENCOUNTER — Other Ambulatory Visit: Payer: Self-pay

## 2020-10-27 ENCOUNTER — Ambulatory Visit (AMBULATORY_SURGERY_CENTER): Payer: BC Managed Care – PPO | Admitting: Gastroenterology

## 2020-10-27 ENCOUNTER — Encounter: Payer: Self-pay | Admitting: Gastroenterology

## 2020-10-27 VITALS — BP 117/73 | HR 69 | Temp 98.0°F | Resp 21 | Ht 65.75 in | Wt 142.0 lb

## 2020-10-27 DIAGNOSIS — R197 Diarrhea, unspecified: Secondary | ICD-10-CM | POA: Diagnosis not present

## 2020-10-27 DIAGNOSIS — R9389 Abnormal findings on diagnostic imaging of other specified body structures: Secondary | ICD-10-CM | POA: Diagnosis not present

## 2020-10-27 DIAGNOSIS — K640 First degree hemorrhoids: Secondary | ICD-10-CM

## 2020-10-27 MED ORDER — SODIUM CHLORIDE 0.9 % IV SOLN: 500.0000 mL | Freq: Once | INTRAVENOUS | Status: DC

## 2020-10-27 NOTE — Op Note (Signed)
[facility] Patient Name: Lindsey [name] Procedure Date: 10/27/2020 8:26 AM MRN: [id number] Endoscopist: Gerrit Heck , MD Age: 55 Referring MD:  Date of Birth: [birth year] Gender: Female Account #: [id number] Procedure:                Colonoscopy Indications:              Abnormal CT of the GI tract, Change in bowel                            habits, Diarrhea                           CT in 07/2020 with colon wall thickening starting in                            the hepatic flexure and moving distally. Normal                            CBC, CMPP, GI PCR panel. Medicines:                Monitored Anesthesia Care Procedure:                Pre-Anesthesia Assessment:                           - Prior to the procedure, a History and Physical                            was performed, and patient medications and                            allergies were reviewed. The patient's tolerance of                            previous anesthesia was also reviewed. The risks                            and benefits of the procedure and the sedation                            options and risks were discussed with the patient.                            All questions were answered, and informed consent                            was obtained. Prior Anticoagulants: The patient has                            taken no previous anticoagulant or antiplatelet                            agents. ASA Grade Assessment: II - A patient with  mild systemic disease. After reviewing the risks                            and benefits, the patient was deemed in                            satisfactory condition to undergo the procedure.                           After obtaining informed consent, the colonoscope                            was passed under direct vision. Throughout the                            procedure, the patient's blood pressure, pulse, and                             oxygen saturations were monitored continuously. The                            CF [id number] #[id number] was introduced through the anus                            and advanced to the the terminal ileum. The                            colonoscopy was performed without difficulty. The                            patient tolerated the procedure well. The quality                            of the bowel preparation was good. The terminal                            ileum, ileocecal valve, appendiceal orifice, and                            rectum were photographed. Scope In: 8:40:09 AM Scope Out: 8:58:19 AM Scope Withdrawal Time: 0 hours 14 minutes 51 seconds  Total Procedure Duration: 0 hours 18 minutes 10 seconds  Findings:                 The perianal and digital rectal examinations were                            normal.                           The colon appeared normal throughout. There were no                            areas of mucosal erythema, edema, erosions, or  ulceration. Biopsies for histology were taken with                            a cold forceps from the right colon and left colon                            for evaluation of microscopic colitis. Estimated                            blood loss was minimal.                           Non-bleeding internal hemorrhoids were found during                            retroflexion. The hemorrhoids were small and Grade                            I (internal hemorrhoids that do not prolapse).                           The terminal ileum appeared normal. Complications:            No immediate complications. Estimated Blood Loss:     Estimated blood loss was minimal. Impression:               - The entire examined colon is normal. Biopsied.                           - Non-bleeding internal hemorrhoids.                           - The examined portion of the ileum was normal. Recommendation:           - Patient has a  contact number available for                            emergencies. The signs and symptoms of potential                            delayed complications were discussed with the                            patient. Return to normal activities tomorrow.                            Written discharge instructions were provided to the                            patient.                           - Resume previous diet.                           - Continue present medications.                           -  Await pathology results.                           - Repeat colonoscopy in 10 years for screening                            purposes.                           - Return to GI office PRN. Gerrit Heck, MD 10/27/2020 9:03:09 AM

## 2020-10-27 NOTE — Progress Notes (Signed)
Called to room to assist during endoscopic procedure.  Patient ID and intended procedure confirmed with present staff. Received instructions for my participation in the procedure from the performing physician.  

## 2020-10-27 NOTE — Progress Notes (Signed)
Pt's states no medical or surgical changes since previsit or office visit. 

## 2020-10-27 NOTE — Patient Instructions (Addendum)
Await pathology  Next colonoscopy= 10 years  Continue your normal medications  Please read over handout about hemorrhoids  YOU HAD AN ENDOSCOPIC PROCEDURE TODAY AT [facility]:   Refer to the procedure report that was given to you for any specific questions about what was found during the examination.  If the procedure report does not answer your questions, please call your gastroenterologist to clarify.  If you requested that your care partner not be given the details of your procedure findings, then the procedure report has been included in a sealed envelope for you to review at your convenience later.  YOU SHOULD EXPECT: Some feelings of bloating in the abdomen. Passage of more gas than usual.  Walking can help get rid of the air that was put into your GI tract during the procedure and reduce the bloating. If you had a lower endoscopy (such as a colonoscopy or flexible sigmoidoscopy) you may notice spotting of blood in your stool or on the toilet paper. If you underwent a bowel prep for your procedure, you may not have a normal bowel movement for a few days.  Please Note:  You might notice some irritation and congestion in your nose or some drainage.  This is from the oxygen used during your procedure.  There is no need for concern and it should clear up in a day or so.  SYMPTOMS TO REPORT IMMEDIATELY:  Following lower endoscopy (colonoscopy or flexible sigmoidoscopy):  Excessive amounts of blood in the stool  Significant tenderness or worsening of abdominal pains  Swelling of the abdomen that is new, acute  Fever of 100F or higher  For urgent or emergent issues, a gastroenterologist can be reached at any hour by calling [phone]. Do not use MyChart messaging for urgent concerns.    DIET:  We do recommend a small meal at first, but then you may proceed to your regular diet.  Drink plenty of fluids but you should avoid alcoholic beverages for 24 hours.  ACTIVITY:   You should plan to take it easy for the rest of today and you should NOT DRIVE or use heavy machinery until tomorrow (because of the sedation medicines used during the test).    FOLLOW UP: Our staff will call the number listed on your records 48-72 hours following your procedure to check on you and address any questions or concerns that you may have regarding the information given to you following your procedure. If we do not reach you, we will leave a message.  We will attempt to reach you two times.  During this call, we will ask if you have developed any symptoms of COVID 19. If you develop any symptoms (ie: fever, flu-like symptoms, shortness of breath, cough etc.) before then, please call [phone].  If you test positive for Covid 19 in the 2 weeks post procedure, please call and report this information to [place].    If any biopsies were taken you will be contacted by phone or by letter within the next 1-3 weeks.  Please call us at [phone] if you have not heard about the biopsies in 3 weeks.    SIGNATURES/CONFIDENTIALITY: You and/or your care partner have signed paperwork which will be entered into your electronic medical record.  These signatures attest to the fact that that the information above on your After Visit Summary has been reviewed and is understood.  Full responsibility of the confidentiality of this discharge information lies with you and/or your care-partner.

## 2020-10-27 NOTE — Progress Notes (Signed)
pt tolerated well. VSS. awake and to recovery. Report given to RN.  

## 2020-10-28 DIAGNOSIS — M1811 Unilateral primary osteoarthritis of first carpometacarpal joint, right hand: Secondary | ICD-10-CM | POA: Diagnosis not present

## 2020-10-28 DIAGNOSIS — Q796 Ehlers-Danlos syndrome, unspecified: Secondary | ICD-10-CM | POA: Diagnosis not present

## 2020-10-28 DIAGNOSIS — G5603 Carpal tunnel syndrome, bilateral upper limbs: Secondary | ICD-10-CM | POA: Diagnosis not present

## 2020-10-29 ENCOUNTER — Telehealth: Payer: Self-pay

## 2020-10-29 DIAGNOSIS — M25531 Pain in right wrist: Secondary | ICD-10-CM | POA: Diagnosis not present

## 2020-10-29 NOTE — Telephone Encounter (Signed)
Second attempt follow up call to pt, lm on vm. 

## 2020-10-29 NOTE — Telephone Encounter (Signed)
First attempt follow up call to pt, lm for pt to call if having any problems or questions, otherwise we will call them back later this morning or early this afternoon.  °

## 2020-10-30 ENCOUNTER — Encounter: Payer: Self-pay | Admitting: Gastroenterology

## 2020-11-01 ENCOUNTER — Other Ambulatory Visit: Payer: Self-pay | Admitting: Neurology

## 2020-11-02 DIAGNOSIS — M25641 Stiffness of right hand, not elsewhere classified: Secondary | ICD-10-CM | POA: Diagnosis not present

## 2020-11-04 ENCOUNTER — Ambulatory Visit: Payer: BC Managed Care – PPO | Admitting: Gastroenterology

## 2020-11-09 ENCOUNTER — Encounter: Payer: Self-pay | Admitting: Gastroenterology

## 2020-11-09 ENCOUNTER — Other Ambulatory Visit: Payer: Self-pay

## 2020-11-09 ENCOUNTER — Ambulatory Visit (INDEPENDENT_AMBULATORY_CARE_PROVIDER_SITE_OTHER): Payer: BC Managed Care – PPO | Admitting: Gastroenterology

## 2020-11-09 VITALS — BP 110/74 | HR 87 | Ht 65.75 in | Wt 148.1 lb

## 2020-11-09 DIAGNOSIS — K640 First degree hemorrhoids: Secondary | ICD-10-CM

## 2020-11-09 DIAGNOSIS — R1013 Epigastric pain: Secondary | ICD-10-CM | POA: Diagnosis not present

## 2020-11-09 NOTE — Patient Instructions (Addendum)
If you are age 53 or older, your body mass index should be between 23-30. Your Body mass index is 24.09 kg/m. If this is out of the aforementioned range listed, please consider follow up with your Primary Care Provider.  If you are age 66 or younger, your body mass index should be between 19-25. Your Body mass index is 24.09 kg/m. If this is out of the aformentioned range listed, please consider follow up with your Primary Care Provider.   __________________________________________________________  The [place] [facility] providers would like to encourage you to use [facility] to communicate with providers for non-urgent requests or questions.  Due to long hold times on the telephone, sending your provider a message by [facility] may be a faster and more efficient way to get a response.  Please allow 48 business hours for a response.  Please remember that this is for non-urgent requests.  ___________________________________________________________  We would like you to start using the Over the Counter medication Fdguard. You will take 2 capsules twice a day 30-60 minutes prior to meals.  Due to recent changes in healthcare laws, you may see the results of your imaging and laboratory studies on MyChart before your provider has had a chance to review them.  We understand that in some cases there may be results that are confusing or concerning to you. Not all laboratory results come back in the same time frame and the provider may be waiting for multiple results in order to interpret others.  Please give [place] 48 hours in order for your provider to thoroughly review all the results before contacting the office for clarification of your results.   You have been scheduled for a hemorrhoid banding on 12/04/2020 at 3:20pm please arrive 15 minutes early  Thank you for choosing me and [facility].  Vito Cirigliano, D.O.

## 2020-11-09 NOTE — Progress Notes (Signed)
Chief Complaint:    Procedure follow-up, hemorrhoids, abdominal cramping  GI History: 55 year old [name] with a history of fibromyalgia, carpal tunnel syndrome, arthritis, Ehlers-Danlos syndrome, IBS, asthma.  IBS: Has been generally well controlled until acute onset abdominal cramping, diarrhea in 06/2020.  Did have right arm surgery on 07/08/2020 and took vancomycin perioperatively. - 07/11/2020: ER evaluation: Normal CBC, CMP.  CT with mild wall thickening throughout the colon mostly distal to hepatic flexure, 4.5 cm uterine fibroid.  Discharged with Cipro/Flagyl - 07/20/2020: GI evaluation.  Negative GI PCR panel.  Trialed course of Bentyl and Imodium - 10/27/2020: Colonoscopy: Normal colon (biopsies negative for MC, IBD), grade 1 internal hemorrhoids.  Normal TI.  Repeat 10 years  Follows at [facility] and takes several vitamins/supplements.  HPI:     Patient is a 55 y.o. [name] presenting to the Gastroenterology Clinic for follow-up after colonoscopy last month, as outlined above.   Today, would like to discuss her prior dx of IBS, which was made 30+ years ago. Outside of acute diarrhea as outlined above, does not have chronic diarrhea in bowel habits mostly formed without issue.  Had been prescribed hyoscyamine for many years, but never took/never helped.  Is on a well water system, but concerns for contaminated water supply- water tested and pending.   Main issue today is intermittent upper abdominal cramping. Sxs have been worse over last 2 years during increased stressors of Covid and taking care of elderly mother, who lives 250 miles away.   Separately, she has a history of symptomatic hemorrhoids.  Grade 1 internal hemorrhoids noted on colonoscopy last month, and she is interested in hemorrhoid banding.   Review of systems:     No chest pain, no SOB, no fevers, no urinary sx   Past Medical History:  Diagnosis Date   Allergy    Angio-edema    Arthritis    Asthma     Carpal tunnel syndrome 07/07/2015   right   Chronic cough 12/02/2015   Chronic pain syndrome 08/10/2016   Ehlers-Danlos syndrome    Essential alopecia of women 04/14/2011   Family history of breast cancer in [name] 08/26/2015   (x2) paternal 1st cousins dx in their 49s    Fibrocystic breast changes of both breasts 07/19/2015   Fibromyalgia    HLD (hyperlipidemia)    Hypermobility syndrome 11/26/2014   Based on Fam Hx and Beighton Score of 7 this is likely ED III    Hypertrophy of vulva 07/20/2017   Inflammatory dermatosis 04/14/2011   Irritable larynx syndrome 12/02/2015   Lumbar radiculopathy 02/16/2016   Osteoarthritis 04/01/2017   Osteopenia    Primary arthrosis of first carpometacarpal joints, bilateral 06/01/2015   Sacroiliac joint dysfunction of right side 11/26/2014   B SI joint xrays last done at [facility]umatology Dr. Kathlene November 03/13/2017   Skin cancer     Patient's surgical history, family medical history, social history, medications and allergies were all reviewed in Epic    Current Outpatient Medications  Medication Sig Dispense Refill   Acetaminophen (TYLENOL 8 HOUR ARTHRITIS PAIN PO) Take 1,300 mg by mouth in the morning and at bedtime.     Albuterol Sulfate (PROAIR RESPICLICK) 123XX123 (90 Base) MCG/ACT AEPB Inhale into the lungs as needed.     Ascorbic Acid (VITAMIN C PO) Take 1,200 mg by mouth 2 (two) times daily.      CHITOSAN PO Take 1,000 mg by mouth in the morning and at bedtime.     Cholecalciferol (VITAMIN  D3) 1.25 MG (50000 UT) TABS Take 1 tablet by mouth once a week.     clonazePAM (KLONOPIN) 0.5 MG tablet TAKE 1 TABLET BY MOUTH EVERYDAY AT BEDTIME 30 tablet 1   cycloSPORINE (RESTASIS) 0.05 % ophthalmic emulsion Place 1 drop into both eyes 2 (two) times daily.     estradiol (VIVELLE-DOT) 0.05 MG/24HR patch APPLY 1 PATCH ONCE EVERY 3 DAYS     FERROUS BISGLYCINATE CHELATE PO Take 18 mg by mouth daily.     fexofenadine (ALLEGRA) 180 MG tablet Take 180 mg by mouth daily.     folic  acid (FOLVITE) Q000111Q MCG tablet Take 800 mcg by mouth daily.     gabapentin (NEURONTIN) 300 MG capsule Take 300 mg by mouth at bedtime.     ipratropium (ATROVENT) 0.06 % nasal spray Place 2 sprays into both nostrils 2 (two) times daily as needed.     LEVOCETIRIZINE DIHYDROCHLORIDE PO Take 5 mg by mouth at bedtime.     MAGNESIUM MALATE PO Take 450 mg by mouth in the morning and at bedtime.     MELATONIN PO Take 30 mg by mouth at bedtime.     Menaquinone-7 (VITAMIN K2 PO) Take 150 mcg by mouth every morning.     montelukast (SINGULAIR) 10 MG tablet Take 1 tablet by mouth at bedtime.     mupirocin ointment (BACTROBAN) 2 % Apply 1 application topically 3 (three) times daily as needed.     NALTREXONE HCL PO Take 6 mg by mouth at bedtime.     OVER THE COUNTER MEDICATION 306 mg every morning. Estrogen control     OVER THE COUNTER MEDICATION 2 capsules every morning. MTHFR Endure (RIH)     PHOSPHATIDYLSERINE PO 200 mg every morning.     Pregnenolone Micronized (PREGNENOLONE PO) Take 75 mg by mouth 3 (three) times a week.     progesterone (PROMETRIUM) 200 MG capsule progesterone micronized 200 mg capsule  TAKE 3 CAPSULES EVERY NIGHT     sertraline (ZOLOFT) 25 MG tablet TAKE 1 TABLET BY MOUTH EVERY DAY 90 tablet 2   Testosterone 75 MG PLLT 75 mg every 3 (three) months. Insert 1 pellet every 3 months     traZODone (DESYREL) 100 MG tablet trazodone 100 mg tablet  TAKE 1-2 TABLET BY MOUTH AT BEDTIME     No current facility-administered medications for this visit.    Physical Exam:     Pulse 87   Ht 5' 5.75" (1.67 m)   Wt 148 lb 2 oz (67.2 kg)   LMP 05/09/2014   SpO2 99%   BMI Lindsey.09 kg/m   GENERAL:  Pleasant [name] in NAD PSYCH: : Cooperative, normal affect ABDOMEN: Mild TTP in upper abdomen, no rebound or guarding.  Nondistended, soft.  SKIN:  turgor, no lesions seen Musculoskeletal:  Normal muscle tone, normal strength NEURO: Alert and oriented x 3, no focal neurologic  deficits   IMPRESSION and PLAN:    1) Abdominal cramping/Dyspepsia - Trial FDGard - If no improvement, plan for EGD to evaluate for additional mucosa/luminal pathology - Not adding TCA given d/w patient and currenlty prescribed SSRI  2) Internal hemorrhoids - Patient would like to schedule appointment for hemorrhoid banding for symptomatic hemorrhoids  3) Diarrhea-resolved      I spent 30 minutes of time, including in depth chart review, independent review of results as outlined above, communicating results with the patient directly, face-to-face time with the patient, coordinating care, and ordering studies and medications as appropriate, and  documentation.     Lavena Bullion ,DO, FACG 11/09/2020, 10:44 AM

## 2020-11-11 DIAGNOSIS — M25641 Stiffness of right hand, not elsewhere classified: Secondary | ICD-10-CM | POA: Diagnosis not present

## 2020-11-17 DIAGNOSIS — N951 Menopausal and female climacteric states: Secondary | ICD-10-CM | POA: Diagnosis not present

## 2020-11-17 DIAGNOSIS — E612 Magnesium deficiency: Secondary | ICD-10-CM | POA: Diagnosis not present

## 2020-11-19 DIAGNOSIS — M25641 Stiffness of right hand, not elsewhere classified: Secondary | ICD-10-CM | POA: Diagnosis not present

## 2020-12-04 ENCOUNTER — Other Ambulatory Visit: Payer: Self-pay

## 2020-12-04 ENCOUNTER — Encounter: Payer: Self-pay | Admitting: Gastroenterology

## 2020-12-04 ENCOUNTER — Ambulatory Visit (INDEPENDENT_AMBULATORY_CARE_PROVIDER_SITE_OTHER): Payer: BC Managed Care – PPO | Admitting: Gastroenterology

## 2020-12-04 VITALS — BP 110/76 | HR 85 | Wt 147.2 lb

## 2020-12-04 DIAGNOSIS — K649 Unspecified hemorrhoids: Secondary | ICD-10-CM

## 2020-12-04 NOTE — Patient Instructions (Addendum)
If you are age 53 or older, your body mass index should be between 23-30. Your Body mass index is 23.95 kg/m. If this is out of the aforementioned range listed, please consider follow up with your Primary Care Provider.  If you are age 58 or younger, your body mass index should be between 19-25. Your Body mass index is 23.95 kg/m. If this is out of the aformentioned range listed, please consider follow up with your Primary Care Provider.   __________________________________________________________  The [place] [facility] providers would like to encourage you to use [facility] to communicate with providers for non-urgent requests or questions.  Due to long hold times on the telephone, sending your provider a message by [facility] may be a faster and more efficient way to get a response.  Please allow 48 business hours for a response.  Please remember that this is for non-urgent requests.   HEMORRHOID BANDING PROCEDURE    FOLLOW-UP CARE   The procedure you have had should have been relatively painless since the banding of the area involved does not have nerve endings and there is no pain sensation.  The rubber band cuts off the blood supply to the hemorrhoid and the band may fall off as soon as 48 hours after the banding (the band may occasionally be seen in the toilet bowl following a bowel movement). You may notice a temporary feeling of fullness in the rectum which should respond adequately to plain Tylenol or Motrin.  Following the banding, avoid strenuous exercise that evening and resume full activity the next day.  A sitz bath (soaking in a warm tub) or bidet is soothing, and can be useful for cleansing the area after bowel movements.     To avoid constipation, take two tablespoons of natural wheat bran, natural oat bran, flax, Benefiber or any over the counter fiber supplement and increase your water intake to 7-8 glasses daily.    Unless you have been prescribed anorectal medication, do not put  anything inside your rectum for two weeks: No suppositories, enemas, fingers, etc.  Occasionally, you may have more bleeding than usual after the banding procedure.  This is often from the untreated hemorrhoids rather than the treated one.  Don't be concerned if there is a tablespoon or so of blood.  If there is more blood than this, lie flat with your bottom higher than your head and apply an ice pack to the area. If the bleeding does not stop within a half an hour or if you feel faint, call our office at [phone] or go to the emergency room.  Problems are not common; however, if there is a substantial amount of bleeding, severe pain, chills, fever or difficulty passing urine (very rare) or other problems, you should call us at [phone] or report to the nearest emergency room.  Do not stay seated continuously for more than 2-3 hours for a day or two after the procedure.  Tighten your buttock muscles 10-15 times every two hours and take 10-15 deep breaths every 1-2 hours.  Do not spend more than a few minutes on the toilet if you cannot empty your bowel; instead re-visit the toilet at a later time.   Next appointment is 01-05-2021 at 8:20am  Please call with any questions or concerns.  .It was a pleasure to see you today!  Vito Cirigliano, D.O.

## 2020-12-04 NOTE — Progress Notes (Signed)
Chief Complaint:    Symptomatic Internal Hemorrhoids; Hemorrhoid Band Ligation  GI History: 55 year old female with a history of fibromyalgia, carpal tunnel syndrome, arthritis, Ehlers-Danlos syndrome, IBS, asthma.  IBS: Has been generally well controlled until acute onset abdominal cramping, diarrhea in 06/2020.  Did have right arm surgery on 07/08/2020 and took vancomycin perioperatively. - 07/11/2020: ER evaluation: Normal CBC, CMP.  CT with mild wall thickening throughout the colon mostly distal to hepatic flexure, 4.5 cm uterine fibroid.  Discharged with Cipro/Flagyl - 07/20/2020: GI evaluation.  Negative GI PCR panel.  Trialed course of Bentyl and Imodium - 10/27/2020: Colonoscopy: Normal colon (biopsies negative for MC, IBD), grade 1 internal hemorrhoids.  Normal TI.  Repeat 10 years - 11/09/2020: Trialed course of FD guard for abdominal cramping/dyspepsia.  Diarrhea resolved   Follows at [facility] and takes several vitamins/supplements.  History of symptomatic hemorrhoids. - 12/04/2020: Presents for hemorrhoid banding   HPI:     Patient is a 55 y.o. femalewith a history of symptomatic internal hemorrhoids presenting to the Gastroenterology Clinic for follow-up and ongoing treatment. The patient presents with symptomatic grade 1-2 hemorrhoids, unresponsive to maximal medical therapy, requesting rubber band ligation of symptomatic hemorrhoidal disease.  No change in medical or surgical history, medications, allergies, social history since last appointment with me.  No new labs or abdominal imaging for review.   Review of systems:     No chest pain, no SOB, no fevers, no urinary sx   Past Medical History:  Diagnosis Date   Allergy    Angio-edema    Arthritis    Asthma    Carpal tunnel syndrome 07/07/2015   right   Chronic cough 12/02/2015   Chronic pain syndrome 08/10/2016   Ehlers-Danlos syndrome    Essential alopecia of women 04/14/2011   Family history of breast cancer  in female 08/26/2015   (x2) paternal 1st cousins dx in their 21s    Fibrocystic breast changes of both breasts 07/19/2015   Fibromyalgia    HLD (hyperlipidemia)    Hypermobility syndrome 11/26/2014   Based on Fam Hx and Beighton Score of 7 this is likely ED III    Hypertrophy of vulva 07/20/2017   Inflammatory dermatosis 04/14/2011   Irritable larynx syndrome 12/02/2015   Lumbar radiculopathy 02/16/2016   Osteoarthritis 04/01/2017   Osteopenia    Primary arthrosis of first carpometacarpal joints, bilateral 06/01/2015   Sacroiliac joint dysfunction of right side 11/26/2014   B SI joint xrays last done at [facility] Rheumatology Dr. Kathlene November 03/13/2017   Skin cancer     Patient's surgical history, family medical history, social history, medications and allergies were all reviewed in Epic    Current Outpatient Medications  Medication Sig Dispense Refill   Acetaminophen (TYLENOL 8 HOUR ARTHRITIS PAIN PO) Take 1,300 mg by mouth in the morning and at bedtime.     Albuterol Sulfate (PROAIR RESPICLICK) 123XX123 (90 Base) MCG/ACT AEPB Inhale into the lungs as needed.     Ascorbic Acid (VITAMIN C PO) Take 1,200 mg by mouth 2 (two) times daily.      CHITOSAN PO Take 1,000 mg by mouth in the morning and at bedtime.     Cholecalciferol (VITAMIN D3) 1.25 MG (50000 UT) TABS Take 1 tablet by mouth once a week.     clonazePAM (KLONOPIN) 0.5 MG tablet TAKE 1 TABLET BY MOUTH EVERYDAY AT BEDTIME 30 tablet 1   cycloSPORINE (RESTASIS) 0.05 % ophthalmic emulsion Place 1 drop into both eyes 2 (two) times  daily.     FERROUS BISGLYCINATE CHELATE PO Take 18 mg by mouth daily.     fexofenadine (ALLEGRA) 180 MG tablet Take 180 mg by mouth daily.     folic acid (FOLVITE) Q000111Q MCG tablet Take 800 mcg by mouth daily.     gabapentin (NEURONTIN) 300 MG capsule Take 300 mg by mouth at bedtime.     ipratropium (ATROVENT) 0.06 % nasal spray Place 2 sprays into both nostrils 2 (two) times daily as needed.     LEVOCETIRIZINE DIHYDROCHLORIDE PO  Take 5 mg by mouth at bedtime.     MAGNESIUM MALATE PO Take 450 mg by mouth in the morning and at bedtime.     MELATONIN PO Take 30 mg by mouth at bedtime.     Menaquinone-7 (VITAMIN K2 PO) Take 150 mcg by mouth every morning.     montelukast (SINGULAIR) 10 MG tablet Take 1 tablet by mouth at bedtime.     mupirocin ointment (BACTROBAN) 2 % Apply 1 application topically 3 (three) times daily as needed.     NALTREXONE HCL PO Take 6 mg by mouth at bedtime.     OVER THE COUNTER MEDICATION 306 mg every morning. Estrogen control     OVER THE COUNTER MEDICATION 2 capsules every morning. MTHFR Endure (RIH)     PHOSPHATIDYLSERINE PO 200 mg every morning.     Pregnenolone Micronized (PREGNENOLONE PO) Take 75 mg by mouth 3 (three) times a week.     progesterone (PROMETRIUM) 200 MG capsule progesterone micronized 200 mg capsule  TAKE 3 CAPSULES EVERY NIGHT     sertraline (ZOLOFT) 25 MG tablet TAKE 1 TABLET BY MOUTH EVERY DAY 90 tablet 2   Testosterone 75 MG PLLT 75 mg every 3 (three) months. Insert 1 pellet every 3 months     traZODone (DESYREL) 100 MG tablet trazodone 100 mg tablet  TAKE 1-2 TABLET BY MOUTH AT BEDTIME     estradiol (VIVELLE-DOT) 0.05 MG/24HR patch APPLY 1 PATCH ONCE EVERY 3 DAYS     No current facility-administered medications for this visit.    Physical Exam:     BP 110/76   Pulse 85   Wt 147 lb 4 oz (66.8 kg)   LMP 05/09/2014   SpO2 98%   BMI 23.95 kg/m   GENERAL:  Pleasant female in NAD PSYCH: : Cooperative, normal affect NEURO: Alert and oriented x 3, no focal neurologic deficits Rectal exam: Sensation intact and preserved anal wink.  Grade 2 hemorrhoids noted in all RA position and Grade 1 hemorrhoids noted RP/LL positions.  Small external skin tag.  No external anal fissures noted. Normal sphincter tone. No palpable mass. No blood on the exam glove. (Chaperone: Renee Rival, CMA).   IMPRESSION and PLAN:    #1.  Symptomatic internal hemorrhoids: PROCEDURE  NOTE: The patient presents with symptomatic grade 1-2 hemorrhoids, unresponsive to maximal medical therapy, requesting rubber band ligation of symptomatic hemorrhoidal disease.  All risks, benefits and alternative forms of therapy were described and informed consent was obtained.  In the Left Lateral Decubitus position, examination revealed grade 1 through hemorrhoids in all position(s).  The anorectum was pre-medicated with RectiCare. The decision was made to band the RA internal hemorrhoid, and the [place] was used to perform band ligation without complication.  Digital anorectal examination was then performed to assure proper positioning of the band, and to adjust the banded tissue as required.  The patient was discharged home without pain or other issues.  Dietary and behavioral recommendations were given and along with follow-up instructions.     The following adjunctive treatments were recommended:  -Resume high-fiber diet with fiber supplement (i.e. Citrucel or Benefiber) with goal for soft stools without straining to have a BM. -Resume adequate fluid intake.  The patient will return in 4 for  follow-up and possible additional banding as required. No complications were encountered and the patient tolerated the procedure well.         [place] ,DO, FACG 12/04/2020, 3:11 PM

## 2020-12-05 ENCOUNTER — Ambulatory Visit
Admission: RE | Admit: 2020-12-05 | Discharge: 2020-12-05 | Disposition: A | Discharge status: Home / Self Care | Payer: BC Managed Care – PPO | Source: Ambulatory Visit | Attending: General Surgery | Admitting: General Surgery

## 2020-12-05 ENCOUNTER — Other Ambulatory Visit: Payer: Self-pay

## 2020-12-05 DIAGNOSIS — R922 Inconclusive mammogram: Secondary | ICD-10-CM | POA: Diagnosis not present

## 2020-12-17 DIAGNOSIS — Z01419 Encounter for gynecological examination (general) (routine) without abnormal findings: Secondary | ICD-10-CM | POA: Diagnosis not present

## 2020-12-17 DIAGNOSIS — Z6823 Body mass index (BMI) 23.0-23.9, adult: Secondary | ICD-10-CM | POA: Diagnosis not present

## 2021-01-02 ENCOUNTER — Other Ambulatory Visit: Payer: Self-pay | Admitting: Neurology

## 2021-01-04 ENCOUNTER — Other Ambulatory Visit: Payer: Self-pay

## 2021-01-04 MED ORDER — CLONAZEPAM 0.5 MG PO TABS: 0.5000 mg | ORAL_TABLET | Freq: Every day | ORAL | 1 refills | Status: DC

## 2021-01-04 NOTE — Telephone Encounter (Signed)
Received refill request for clonazepam.  Last OV was on 01/14/20.  Next OV is not scheduled.  Last RX was written on 12/02/20 for 30 tabs.   [place] [facility] Database has been reviewed.

## 2021-01-05 ENCOUNTER — Other Ambulatory Visit: Payer: Self-pay

## 2021-01-05 ENCOUNTER — Ambulatory Visit (INDEPENDENT_AMBULATORY_CARE_PROVIDER_SITE_OTHER): Payer: BC Managed Care – PPO | Admitting: Gastroenterology

## 2021-01-05 ENCOUNTER — Encounter: Payer: Self-pay | Admitting: Gastroenterology

## 2021-01-05 DIAGNOSIS — K641 Second degree hemorrhoids: Secondary | ICD-10-CM

## 2021-01-05 NOTE — Patient Instructions (Signed)
If you are age 53 or older, your body mass index should be between 23-30. Your There is no height or weight on file to calculate BMI. If this is out of the aforementioned range listed, please consider follow up with your Primary Care Provider.  If you are age 2 or younger, your body mass index should be between 19-25. Your There is no height or weight on file to calculate BMI. If this is out of the aformentioned range listed, please consider follow up with your Primary Care Provider.   __________________________________________________________  The [place] [facility] providers would like to encourage you to use [facility] to communicate with providers for non-urgent requests or questions.  Due to long hold times on the telephone, sending your provider a message by [facility] may be a faster and more efficient way to get a response.  Please allow 48 business hours for a response.  Please remember that this is for non-urgent requests.    ___________________________________________________________  Lindsey [name] PROCEDURE    FOLLOW-UP CARE   The procedure you have had should have been relatively painless since the banding of the area involved does not have nerve endings and there is no pain sensation.  The rubber band cuts off the blood supply to the hemorrhoid and the band may fall off as soon as 48 hours after the banding (the band may occasionally be seen in the toilet bowl following a bowel movement). You may notice a temporary feeling of fullness in the rectum which should respond adequately to plain Tylenol or Motrin.  Following the banding, avoid strenuous exercise that evening and resume full activity the next day.  A sitz bath (soaking in a warm tub) or bidet is soothing, and can be useful for cleansing the area after bowel movements.     To avoid constipation, take two tablespoons of natural wheat bran, natural oat bran, flax, Benefiber or any over the counter fiber supplement and increase your  water intake to 7-8 glasses daily.    Unless you have been prescribed anorectal medication, do not put anything inside your rectum for two weeks: No suppositories, enemas, fingers, etc.  Occasionally, you may have more bleeding than usual after the banding procedure.  This is often from the untreated hemorrhoids rather than the treated one.  Don't be concerned if there is a tablespoon or so of blood.  If there is more blood than this, lie flat with your bottom higher than your head and apply an ice pack to the area. If the bleeding does not stop within a half an hour or if you feel faint, call our office at [phone] or go to the emergency room.  Problems are not common; however, if there is a substantial amount of bleeding, severe pain, chills, fever or difficulty passing urine (very rare) or other problems, you should call us at [phone] or report to the nearest emergency room.  Do not stay seated continuously for more than 2-3 hours for a day or two after the procedure.  Tighten your buttock muscles 10-15 times every two hours and take 10-15 deep breaths every 1-2 hours.  Do not spend more than a few minutes on the toilet if you cannot empty your bowel; instead re-visit the toilet at a later time.    Due to recent changes in healthcare laws, you may see the results of your imaging and laboratory studies on MyChart before your provider has had a chance to review them.  We understand that  in some cases there may be results that are confusing or concerning to you. Not all laboratory results come back in the same time frame and the provider may be waiting for multiple results in order to interpret others.  Please give [place] 48 hours in order for your provider to thoroughly review all the results before contacting the office for clarification of your results.   Thank you for choosing me and [facility].  Lindsey [name], D.O.

## 2021-01-05 NOTE — Progress Notes (Signed)
Chief Complaint:    Symptomatic Internal Hemorrhoids; Hemorrhoid Band Ligation  GI History: 55 year old female with a history of fibromyalgia, carpal tunnel syndrome, arthritis, Ehlers-Danlos syndrome, IBS, asthma.  IBS: Has been generally well controlled until acute onset abdominal cramping, diarrhea in 06/2020.  Did have right arm surgery on 07/08/2020 and took vancomycin perioperatively. - 07/11/2020: ER evaluation: Normal CBC, CMP.  CT with mild wall thickening throughout the colon mostly distal to hepatic flexure, 4.5 cm uterine fibroid.  Discharged with Cipro/Flagyl - 07/20/2020: GI evaluation.  Negative GI PCR panel.  Trialed course of Bentyl and Imodium - 10/27/2020: Colonoscopy: Normal colon (biopsies negative for MC, IBD), grade 1 internal hemorrhoids.  Normal TI.  Repeat 10 years - 11/09/2020: Trialed course of FD guard for abdominal cramping/dyspepsia.  Diarrhea resolved   Follows at [facility] and takes several vitamins/supplements.   History of symptomatic hemorrhoids. - 12/04/2020: Banding of RA hemorrhoid - 01/05/2021: Presents for hemorrhoid banding #2  HPI:     Patient is a 55 y.o. femalewith a history of symptomatic internal hemorrhoids presenting to the [facility] for follow-up and ongoing treatment. The patient presents with symptomatic grade 1-2 hemorrhoids, unresponsive to maximal medical therapy, requesting rubber band ligation of symptomatic hemorrhoidal disease.  Did well with first hemorrhoid banding last month.  Otherwise no active issues today.  No change in medical or surgical history, medications, allergies, social history since last appointment with me.  No new labs or abdominal imaging for review since last appointment.   Review of systems:     No chest pain, no SOB, no fevers, no urinary sx   Past Medical History:  Diagnosis Date   Allergy    Angio-edema    Arthritis    Asthma    Carpal tunnel syndrome 07/07/2015   right   Chronic  cough 12/02/2015   Chronic pain syndrome 08/10/2016   Ehlers-Danlos syndrome    Essential alopecia of women 04/14/2011   Family history of breast cancer in female 08/26/2015   (x2) paternal 1st cousins dx in their 35s    Fibrocystic breast changes of both breasts 07/19/2015   Fibromyalgia    HLD (hyperlipidemia)    Hypermobility syndrome 11/26/2014   Based on Fam Hx and Beighton Score of 7 this is likely ED III    Hypertrophy of vulva 07/20/2017   Inflammatory dermatosis 04/14/2011   Irritable larynx syndrome 12/02/2015   Lumbar radiculopathy 02/16/2016   Osteoarthritis 04/01/2017   Osteopenia    Primary arthrosis of first carpometacarpal joints, bilateral 06/01/2015   Sacroiliac joint dysfunction of right side 11/26/2014   B SI joint xrays last done at [facility] Dr. Kathlene November 03/13/2017   Skin cancer     Patient's surgical history, family medical history, social history, medications and allergies were all reviewed in Epic    Current Outpatient Medications  Medication Sig Dispense Refill   Acetaminophen (TYLENOL 8 HOUR ARTHRITIS PAIN PO) Take 1,300 mg by mouth in the morning and at bedtime.     Albuterol Sulfate (PROAIR RESPICLICK) 409 (90 Base) MCG/ACT AEPB Inhale into the lungs as needed.     Ascorbic Acid (VITAMIN C PO) Take 1,200 mg by mouth 2 (two) times daily.      CHITOSAN PO Take 1,000 mg by mouth in the morning and at bedtime.     Cholecalciferol (VITAMIN D3) 1.25 MG (50000 UT) TABS Take 1 tablet by mouth once a week.     clonazePAM (KLONOPIN) 0.5 MG tablet Take 1 tablet (  0.5 mg total) by mouth at bedtime. Must make overdue follow up appt for further refills, call [phone]. 1st attempt 30 tablet 1   cycloSPORINE (RESTASIS) 0.05 % ophthalmic emulsion Place 1 drop into both eyes 2 (two) times daily.     estradiol (VIVELLE-DOT) 0.075 MG/24HR Place onto the skin every 3 (three) days.     FERROUS BISGLYCINATE CHELATE PO Take 18 mg by mouth daily.     fexofenadine (ALLEGRA) 180 MG  tablet Take 180 mg by mouth daily.     folic acid (FOLVITE) 149 MCG tablet Take 800 mcg by mouth daily.     gabapentin (NEURONTIN) 300 MG capsule Take 300 mg by mouth at bedtime.     ipratropium (ATROVENT) 0.06 % nasal spray Place 2 sprays into both nostrils 2 (two) times daily as needed.     LEVOCETIRIZINE DIHYDROCHLORIDE PO Take 5 mg by mouth at bedtime.     MAGNESIUM MALATE PO Take 450 mg by mouth in the morning and at bedtime.     MELATONIN PO Take 30 mg by mouth at bedtime.     Menaquinone-7 (VITAMIN K2 PO) Take 150 mcg by mouth every morning.     montelukast (SINGULAIR) 10 MG tablet Take 1 tablet by mouth at bedtime.     mupirocin ointment (BACTROBAN) 2 % Apply 1 application topically 3 (three) times daily as needed.     NALTREXONE HCL PO Take 6 mg by mouth at bedtime.     OVER THE COUNTER MEDICATION 306 mg every morning. Estrogen control     OVER THE COUNTER MEDICATION 2 capsules every morning. MTHFR Endure (RIH)     PHOSPHATIDYLSERINE PO 200 mg every morning.     Pregnenolone Micronized (PREGNENOLONE PO) Take 75 mg by mouth 3 (three) times a week.     progesterone (PROMETRIUM) 200 MG capsule progesterone micronized 200 mg capsule  TAKE 3 CAPSULES EVERY NIGHT     sertraline (ZOLOFT) 25 MG tablet TAKE 1 TABLET BY MOUTH EVERY DAY 90 tablet 2   Testosterone 75 MG PLLT 75 mg every 3 (three) months. Insert 1 pellet every 3 months     traZODone (DESYREL) 100 MG tablet trazodone 100 mg tablet  TAKE 1-2 TABLET BY MOUTH AT BEDTIME     No current facility-administered medications for this visit.    Physical Exam:     LMP 05/09/2014   GENERAL:  Pleasant female in NAD PSYCH: : Cooperative, normal affect NEURO: Alert and oriented x 3, no focal neurologic deficits Rectal exam: Sensation intact and preserved anal wink.  Grade 2 hemorrhoids noted in LL and RP positions on exam.  No external anal fissures noted. Normal sphincter tone. No palpable mass. No blood on the exam glove. (Chaperone:  Renee Rival, CMA).   IMPRESSION and PLAN:    #1.  Symptomatic internal hemorrhoids: PROCEDURE NOTE: The patient presents with symptomatic grade 1 and 2 hemorrhoids, unresponsive to maximal medical therapy, requesting rubber band ligation of symptomatic hemorrhoidal disease.  All risks, benefits and alternative forms of therapy were described and informed consent was obtained.  In the Left Lateral Decubitus position, anoscopic examination revealed grade 2 hemorrhoids in the LL and RP position(s).  The anorectum was pre-medicated with RectiCare. The decision was made to band the LL internal hemorrhoid, and the [place] was used to perform band ligation without complication.  Digital anorectal examination was then performed to assure proper positioning of the band, and to adjust the banded tissue as required.  The  patient was discharged home without pain or other issues.  Dietary and behavioral recommendations were given and along with follow-up instructions.     The following adjunctive treatments were recommended:  -Resume high-fiber diet with fiber supplement (i.e. Citrucel or Benefiber) with goal for soft stools without straining to have a BM. -Resume adequate fluid intake.  The patient will return in 4 for  follow-up and possible additional banding as required. No complications were encountered and the patient tolerated the procedure well.       Blevins ,DO, FACG 01/05/2021, 8:29 AM

## 2021-01-12 ENCOUNTER — Ambulatory Visit: Payer: BC Managed Care – PPO | Admitting: Gastroenterology

## 2021-01-13 DIAGNOSIS — Q796 Ehlers-Danlos syndrome, unspecified: Secondary | ICD-10-CM | POA: Diagnosis not present

## 2021-01-13 DIAGNOSIS — M13842 Other specified arthritis, left hand: Secondary | ICD-10-CM | POA: Diagnosis not present

## 2021-01-13 DIAGNOSIS — G5603 Carpal tunnel syndrome, bilateral upper limbs: Secondary | ICD-10-CM | POA: Diagnosis not present

## 2021-01-13 DIAGNOSIS — M1811 Unilateral primary osteoarthritis of first carpometacarpal joint, right hand: Secondary | ICD-10-CM | POA: Diagnosis not present

## 2021-01-25 DIAGNOSIS — N951 Menopausal and female climacteric states: Secondary | ICD-10-CM | POA: Diagnosis not present

## 2021-02-04 ENCOUNTER — Ambulatory Visit: Payer: BC Managed Care – PPO | Admitting: Gastroenterology

## 2021-02-11 ENCOUNTER — Other Ambulatory Visit: Payer: Self-pay | Admitting: General Surgery

## 2021-02-11 DIAGNOSIS — Z9189 Other specified personal risk factors, not elsewhere classified: Secondary | ICD-10-CM

## 2021-02-17 DIAGNOSIS — M79641 Pain in right hand: Secondary | ICD-10-CM | POA: Diagnosis not present

## 2021-03-02 ENCOUNTER — Ambulatory Visit
Admission: RE | Admit: 2021-03-02 | Discharge: 2021-03-02 | Disposition: A | Discharge status: Home / Self Care | Payer: BC Managed Care – PPO | Source: Ambulatory Visit | Attending: General Surgery | Admitting: General Surgery

## 2021-03-02 ENCOUNTER — Encounter: Payer: Self-pay | Admitting: Gastroenterology

## 2021-03-02 ENCOUNTER — Ambulatory Visit (INDEPENDENT_AMBULATORY_CARE_PROVIDER_SITE_OTHER): Payer: BC Managed Care – PPO | Admitting: Gastroenterology

## 2021-03-02 ENCOUNTER — Other Ambulatory Visit: Payer: Self-pay

## 2021-03-02 VITALS — BP 114/80 | HR 73 | Ht 66.0 in | Wt 148.1 lb

## 2021-03-02 DIAGNOSIS — Z9189 Other specified personal risk factors, not elsewhere classified: Secondary | ICD-10-CM

## 2021-03-02 DIAGNOSIS — K641 Second degree hemorrhoids: Secondary | ICD-10-CM | POA: Diagnosis not present

## 2021-03-02 DIAGNOSIS — R928 Other abnormal and inconclusive findings on diagnostic imaging of breast: Secondary | ICD-10-CM | POA: Diagnosis not present

## 2021-03-02 MED ORDER — GADOBUTROL 1 MMOL/ML IV SOLN
7.0000 mL | Freq: Once | INTRAVENOUS | Status: AC | PRN
  Administered 2021-03-02: 7 mL via INTRAVENOUS

## 2021-03-02 NOTE — Progress Notes (Signed)
Chief Complaint:    Symptomatic Internal Hemorrhoids; Hemorrhoid Band Ligation  GI History: 55 year old female with a history of fibromyalgia, carpal tunnel syndrome, arthritis, Ehlers-Danlos syndrome, IBS, asthma.  IBS: Has been generally well controlled until acute onset abdominal cramping, diarrhea in 06/2020.  Did have right arm surgery on 07/08/2020 and took vancomycin perioperatively. - 07/11/2020: ER evaluation: Normal CBC, CMP.  CT with mild wall thickening throughout the colon mostly distal to hepatic flexure, 4.5 cm uterine fibroid.  Discharged with Cipro/Flagyl - 07/20/2020: GI evaluation.  Negative GI PCR panel.  Trialed course of Bentyl and Imodium - 10/27/2020: Colonoscopy: Normal colon (biopsies negative for MC, IBD), grade 1 internal hemorrhoids.  Normal TI.  Repeat 10 years - 11/09/2020: Trialed course of FD guard for abdominal cramping/dyspepsia.  Diarrhea resolved   Follows at [facility] and takes several vitamins/supplements.   History of symptomatic hemorrhoids. - 12/04/2020: Banding of RA hemorrhoid - 01/05/2021: Banding of LL hemorrhoid - 03/02/2021: Presents for hemorrhoid banding #3  HPI:     Patient is a 55 y.o. femalewith a history of symptomatic internal hemorrhoids presenting to the [facility]astroenterology Clinic for follow-up and ongoing treatment. The patient presents with symptomatic grade 1-2 hemorrhoids, unresponsive to maximal medical therapy, requesting rubber band ligation of symptomatic hemorrhoidal disease.  Has done well with each of the first 2 bandings. No active issues/complaints today.  No change in medical or surgical history, medications, allergies, social history since last appointment with me.   Review of systems:     No chest pain, no SOB, no fevers, no urinary sx   Past Medical History:  Diagnosis Date   Allergy    Angio-edema    Arthritis    Asthma    Carpal tunnel syndrome 07/07/2015   right   Chronic cough 12/02/2015   Chronic  pain syndrome 08/10/2016   Ehlers-Danlos syndrome    Essential alopecia of women 04/14/2011   Family history of breast cancer in female 08/26/2015   (x2) paternal 1st cousins dx in their 33s    Fibrocystic breast changes of both breasts 07/19/2015   Fibromyalgia    HLD (hyperlipidemia)    Hypermobility syndrome 11/26/2014   Based on Fam Hx and Beighton Score of 7 this is likely ED III    Hypertrophy of vulva 07/20/2017   Inflammatory dermatosis 04/14/2011   Irritable larynx syndrome 12/02/2015   Lumbar radiculopathy 02/16/2016   Osteoarthritis 04/01/2017   Osteopenia    Primary arthrosis of first carpometacarpal joints, bilateral 06/01/2015   Sacroiliac joint dysfunction of right side 11/26/2014   B SI joint xrays last done at [facility] Dr. Kathlene November 03/13/2017   Skin cancer     Patient's surgical history, family medical history, social history, medications and allergies were all reviewed in Epic    Current Outpatient Medications  Medication Sig Dispense Refill   Acetaminophen (TYLENOL 8 HOUR ARTHRITIS PAIN PO) Take 1,300 mg by mouth in the morning and at bedtime.     Albuterol Sulfate (PROAIR RESPICLICK) 161 (90 Base) MCG/ACT AEPB Inhale into the lungs as needed.     Ascorbic Acid (VITAMIN C PO) Take 1,200 mg by mouth 2 (two) times daily.      CHITOSAN PO Take 1,000 mg by mouth in the morning and at bedtime.     Cholecalciferol (VITAMIN D3) 1.25 MG (50000 UT) TABS Take 1 tablet by mouth once a week.     clonazePAM (KLONOPIN) 0.5 MG tablet Take 1 tablet (0.5 mg total) by mouth at  bedtime. Must make overdue follow up appt for further refills, call [phone]. 1st attempt 30 tablet 1   cycloSPORINE (RESTASIS) 0.05 % ophthalmic emulsion Place 1 drop into both eyes 2 (two) times daily.     estradiol (VIVELLE-DOT) 0.075 MG/24HR Place onto the skin every 3 (three) days.     FERROUS BISGLYCINATE CHELATE PO Take 18 mg by mouth daily.     fexofenadine (ALLEGRA) 180 MG tablet Take 180 mg by mouth  daily.     folic acid (FOLVITE) 518 MCG tablet Take 800 mcg by mouth daily.     gabapentin (NEURONTIN) 300 MG capsule Take 300 mg by mouth at bedtime.     ipratropium (ATROVENT) 0.06 % nasal spray Place 2 sprays into both nostrils 2 (two) times daily as needed.     LEVOCETIRIZINE DIHYDROCHLORIDE PO Take 5 mg by mouth at bedtime.     MAGNESIUM MALATE PO Take 450 mg by mouth in the morning and at bedtime.     MELATONIN PO Take 30 mg by mouth at bedtime.     Menaquinone-7 (VITAMIN K2 PO) Take 150 mcg by mouth every morning.     montelukast (SINGULAIR) 10 MG tablet Take 1 tablet by mouth at bedtime.     mupirocin ointment (BACTROBAN) 2 % Apply 1 application topically 3 (three) times daily as needed.     NALTREXONE HCL PO Take 6 mg by mouth at bedtime.     OVER THE COUNTER MEDICATION 306 mg every morning. Estrogen control     OVER THE COUNTER MEDICATION 2 capsules every morning. MTHFR Endure (RIH)     PHOSPHATIDYLSERINE PO 200 mg every morning.     Pregnenolone Micronized (PREGNENOLONE PO) Take 75 mg by mouth 3 (three) times a week.     progesterone (PROMETRIUM) 200 MG capsule progesterone micronized 200 mg capsule  TAKE 3 CAPSULES EVERY NIGHT     sertraline (ZOLOFT) 25 MG tablet TAKE 1 TABLET BY MOUTH EVERY DAY 90 tablet 2   Testosterone 75 MG PLLT 75 mg every 3 (three) months. Insert 1 pellet every 3 months     traZODone (DESYREL) 100 MG tablet trazodone 100 mg tablet  TAKE 1-2 TABLET BY MOUTH AT BEDTIME     No current facility-administered medications for this visit.    Physical Exam:     BP 114/80   Pulse 73   Ht 5\' 6"  (1.676 m)   Wt 148 lb 2 oz (67.2 kg)   LMP 05/09/2014   BMI 23.91 kg/m   GENERAL:  Pleasant female in NAD PSYCH: : Cooperative, normal affect NEURO: Alert and oriented x 3, no focal neurologic deficits Rectal exam: Sensation intact and preserved anal wink.  Grade 1 hemorrhoid noted in RP position.  No external anal fissures noted. Normal sphincter tone. No  palpable mass. No blood on the exam glove. (Chaperone: Curlene Labrum, CMA).   IMPRESSION and PLAN:    #1.  Symptomatic internal hemorrhoids: PROCEDURE NOTE: The patient presents with symptomatic grade 1-2 hemorrhoids, unresponsive to maximal medical therapy, requesting rubber band ligation of symptomatic hemorrhoidal disease.  All risks, benefits and alternative forms of therapy were described and informed consent was obtained.  In the Left Lateral Decubitus position, anoscopic examination revealed grade 1 hemorrhoids in the RP position(s).  The anorectum was pre-medicated with RectiCare. The decision was made to band the RP internal hemorrhoid, and the [place] was used to perform band ligation without complication.  Digital anorectal examination was then performed to assure  proper positioning of the band, and to adjust the banded tissue as required.  The patient was discharged home without pain or other issues.  Dietary and behavioral recommendations were given and along with follow-up instructions.     The following adjunctive treatments were recommended:  -Resume high-fiber diet with fiber supplement (i.e. Citrucel or Benefiber) with goal for soft stools without straining to have a BM. -Resume adequate fluid intake.  The patient will return as needed to the GI clinic if return of hemorrhoidal symptoms for evaluation and possible additional banding as required. No complications were encountered and the patient tolerated the procedure well.       Lavena Bullion ,DO, FACG 03/02/2021, 9:53 AM

## 2021-03-02 NOTE — Patient Instructions (Signed)
If you are age 53 or older, your body mass index should be between 23-30. Your Body mass index is 23.91 kg/m. If this is out of the aforementioned range listed, please consider follow up with your Primary Care Provider.  If you are age 42 or younger, your body mass index should be between 19-25. Your Body mass index is 23.91 kg/m. If this is out of the aformentioned range listed, please consider follow up with your Primary Care Provider.   __________________________________________________________  The [place] [facility] providers would like to encourage you to use [facility] to communicate with providers for non-urgent requests or questions.  Due to long hold times on the telephone, sending your provider a message by [facility], [facility], [facility] may be a faster and more efficient way to get a response.  Please allow 48 business hours for a response.  Please remember that this is for non-urgent requests.   HEMORRHOID BANDING PROCEDURE    FOLLOW-UP CARE   The procedure you have had should have been relatively painless since the banding of the area involved does not have nerve endings and there is no pain sensation.  The rubber band cuts off the blood supply to the hemorrhoid and the band may fall off as soon as 48 hours after the banding (the band may occasionally be seen in the toilet bowl following a bowel movement). You may notice a temporary feeling of fullness in the rectum which should respond adequately to plain Tylenol or Motrin.  Following the banding, avoid strenuous exercise that evening and resume full activity the next day.  A sitz bath (soaking in a warm tub) or bidet is soothing, and can be useful for cleansing the area after bowel movements.     To avoid constipation, take two tablespoons of natural wheat bran, natural oat bran, flax, Benefiber or any over the counter fiber supplement and increase your water intake to 7-8 glasses daily.    Unless you have been prescribed anorectal medication, do not put  anything inside your rectum for two weeks: No suppositories, enemas, fingers, etc.  Occasionally, you may have more bleeding than usual after the banding procedure.  This is often from the untreated hemorrhoids rather than the treated one.  Don't be concerned if there is a tablespoon or so of blood.  If there is more blood than this, lie flat with your bottom higher than your head and apply an ice pack to the area. If the bleeding does not stop within a half an hour or if you feel faint, call our office at [phone] or go to the emergency room.  Problems are not common; however, if there is a substantial amount of bleeding, severe pain, chills, fever or difficulty passing urine (very rare) or other problems, you should call us at [phone] or report to the nearest emergency room.  Do not stay seated continuously for more than 2-3 hours for a day or two after the procedure.  Tighten your buttock muscles 10-15 times every two hours and take 10-15 deep breaths every 1-2 hours.  Do not spend more than a few minutes on the toilet if you cannot empty your bowel; instead re-visit the toilet at a later time.   Thank you for choosing me and [facility].  Vito Cirigliano, D.O.

## 2021-03-07 ENCOUNTER — Other Ambulatory Visit: Payer: Self-pay | Admitting: Neurology

## 2021-03-11 DIAGNOSIS — G5601 Carpal tunnel syndrome, right upper limb: Secondary | ICD-10-CM | POA: Insufficient documentation

## 2021-03-15 ENCOUNTER — Emergency Department (HOSPITAL_COMMUNITY)
Admission: EM | Admit: 2021-03-15 | Discharge: 2021-03-15 | Disposition: A | Discharge status: Home / Self Care | Payer: BC Managed Care – PPO | Attending: Emergency Medicine | Admitting: Emergency Medicine

## 2021-03-15 ENCOUNTER — Encounter (HOSPITAL_COMMUNITY): Payer: Self-pay

## 2021-03-15 ENCOUNTER — Other Ambulatory Visit: Payer: Self-pay

## 2021-03-15 ENCOUNTER — Other Ambulatory Visit: Payer: Self-pay | Admitting: Neurology

## 2021-03-15 DIAGNOSIS — Z79899 Other long term (current) drug therapy: Secondary | ICD-10-CM

## 2021-03-15 DIAGNOSIS — F1990 Other psychoactive substance use, unspecified, uncomplicated: Secondary | ICD-10-CM | POA: Diagnosis not present

## 2021-03-15 DIAGNOSIS — Z85828 Personal history of other malignant neoplasm of skin: Secondary | ICD-10-CM | POA: Insufficient documentation

## 2021-03-15 DIAGNOSIS — Z760 Encounter for issue of repeat prescription: Secondary | ICD-10-CM | POA: Insufficient documentation

## 2021-03-15 DIAGNOSIS — J45909 Unspecified asthma, uncomplicated: Secondary | ICD-10-CM | POA: Diagnosis not present

## 2021-03-15 MED ORDER — CLONAZEPAM 0.5 MG PO TABS
0.5000 mg | ORAL_TABLET | Freq: Every day | ORAL | 1 refills | Status: DC
Start: 2021-03-15 — End: 2022-11-04

## 2021-03-15 MED ORDER — CLONAZEPAM 0.5 MG PO TABS: 0.5000 mg | ORAL_TABLET | Freq: Once | ORAL | 0 refills | Status: DC

## 2021-03-15 NOTE — ED Provider Notes (Signed)
[facility] DEPT Provider Note   CSN: [id number] Arrival date & time: 03/15/21  1905     History Chief Complaint  Patient presents with   Medication Refill    Lindsey [name] is a 55 y.o. female with a past medical history of Ehlers-Danlos, hypermobility, chronic benzodiazepine use who presents today for evaluation requesting a refill on her clonazepam.  She states that she has been out of it for about a week and that she has not slept during that time.  She normally takes 1 pill every night which allows her to sleep. She denies any other complaints or concerns today. She states that she had tried to call her doctor who prescribes this for her through [facility] and that the pharmacy had sent a refill request to the wrong provider.  Prior to her coming here today she called the pharmacy per her report and said that they had not yet received the prescription.  HPI     Past Medical History:  Diagnosis Date   Allergy    Angio-edema    Arthritis    Asthma    Carpal tunnel syndrome 07/07/2015   right   Chronic cough 12/02/2015   Chronic pain syndrome 08/10/2016   Ehlers-Danlos syndrome    Essential alopecia of women 04/14/2011   Family history of breast cancer in female 08/26/2015   (x2) paternal 1st cousins dx in their 48s    Fibrocystic breast changes of both breasts 07/19/2015   Fibromyalgia    HLD (hyperlipidemia)    Hypermobility syndrome 11/26/2014   Based on Fam Hx and Beighton Score of 7 this is likely ED III    Hypertrophy of vulva 07/20/2017   Inflammatory dermatosis 04/14/2011   Irritable larynx syndrome 12/02/2015   Lumbar radiculopathy 02/16/2016   Osteoarthritis 04/01/2017   Osteopenia    Primary arthrosis of first carpometacarpal joints, bilateral 06/01/2015   Sacroiliac joint dysfunction of right side 11/26/2014   B SI joint xrays last done at [facility] Dr. Kathlene November 03/13/2017   Skin cancer     Patient Active Problem List    Diagnosis Date Noted   Non-restorative sleep 01/14/2020   Menopausal and female climacteric states 01/14/2020   Insomnia due to medical condition 01/14/2020   Hypertrophy of vulva 07/20/2017   Osteoarthritis 04/01/2017   Chronic pain syndrome 08/10/2016   Fibromyalgia 08/10/2016   Lumbar radiculopathy 02/16/2016   Chronic cough 12/02/2015   Irritable larynx syndrome 12/02/2015   Genetic testing 09/21/2015   Family history of breast cancer in female 08/26/2015   Fibrocystic breast changes of both breasts 07/19/2015   Carpal tunnel syndrome 07/07/2015   Primary arthrosis of first carpometacarpal joints, bilateral 06/01/2015   Ehlers-Danlos syndrome 05/11/2015   Sacroiliac joint dysfunction of right side 11/26/2014   Hypermobility syndrome 11/26/2014   Essential alopecia of women 04/14/2011   Inflammatory dermatosis 04/14/2011    Past Surgical History:  Procedure Laterality Date   BREAST EXCISIONAL BIOPSY Left 01/2019   atypical lobular hyperplasia   CARPAL TUNNEL RELEASE Right 06/2020   DENTAL SURGERY     ELBOW FRACTURE SURGERY Left    as child   RADIOACTIVE SEED GUIDED EXCISIONAL BREAST BIOPSY Left 02/05/2019   Procedure: RADIOACTIVE SEED GUIDED EXCISIONAL LEFT BREAST BIOPSY;  Surgeon: Rolm Bookbinder, MD;  Location: [facility];  Service: General;  Laterality: Left;     OB History   No obstetric history on file.     Family History  Problem Relation  Age of Onset   Hypertension Mother    Other Mother        hx of hysterectomy at 26-56 for prolapsed uterus; hx of benign L arm cyst in her 74s   Stroke Father    Hypertension Father    Skin cancer Father 15       NOS   Other Father        enlarged prostate s/p surgery   Alzheimer's disease Maternal Aunt    Stomach cancer Paternal Aunt        d. 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 75-76   Heart Problems Paternal Grandmother    Heart Problems Paternal Grandfather    Colon cancer Other        maternal great aunt (MGM's sister) dx in her 4s   Heart attack Paternal Uncle 17   Heart Problems Paternal Uncle    Angina Paternal Uncle    Breast cancer Cousin        paternal 1st cousin d. 69   Breast cancer Cousin        paternal 1st cousin dx. 26s   Colon polyps Neg Hx    Esophageal cancer Neg Hx    Rectal cancer Neg Hx     Social History   Tobacco Use   Smoking status: Never   Smokeless tobacco: Never  Vaping Use   Vaping Use: Never used  Substance Use Topics   Alcohol use: No    Alcohol/week: 0.0 standard drinks   Drug use: No    Home Medications Prior to Admission medications   Medication Sig Start Date End Date Taking? Authorizing Provider  clonazePAM (KLONOPIN) 0.5 MG tablet Take 1 tablet (0.5 mg total) by mouth once for 1 dose. 03/15/21 03/15/21 Yes Lorin Glass, PA-C  Acetaminophen (TYLENOL 8 HOUR ARTHRITIS PAIN PO) Take 1,300 mg by mouth in the morning and at bedtime.    [provider]  Albuterol Sulfate (PROAIR RESPICLICK) 616 (90 Base) MCG/ACT AEPB Inhale into the lungs as needed.    [provider]  Ascorbic Acid (VITAMIN C PO) Take 1,200 mg by mouth 2 (two) times daily.     [provider]  CHITOSAN PO Take 1,000 mg by mouth in the morning and at bedtime.    [provider]  Cholecalciferol (VITAMIN D3) 1.25 MG (50000 UT) TABS Take 1 tablet by mouth once a week.    [provider]  clonazePAM (KLONOPIN) 0.5 MG tablet Take 1 tablet (0.5 mg total) by mouth at bedtime. 03/15/21   Dohmeier, Asencion Partridge, MD  cycloSPORINE (RESTASIS) 0.05 % ophthalmic emulsion Place 1 drop into both eyes 2 (two) times daily.    [provider]  estradiol (VIVELLE-DOT) 0.075 MG/24HR Place onto the skin every 3 (three) days. 12/20/20   [provider]  FERROUS BISGLYCINATE  CHELATE PO Take 18 mg by mouth daily.    [provider]  fexofenadine (ALLEGRA) 180 MG tablet Take 180 mg by mouth daily.    [provider]  folic acid (FOLVITE) 073 MCG tablet Take 800 mcg by mouth daily.    [provider]  gabapentin (NEURONTIN) 300 MG capsule Take 300 mg by mouth at bedtime. 04/17/14   [provider]  ipratropium (ATROVENT) 0.06 % nasal spray Place 2  sprays into both nostrils 2 (two) times daily as needed.    [provider]  LEVOCETIRIZINE DIHYDROCHLORIDE PO Take 5 mg by mouth at bedtime.    [provider]  MAGNESIUM MALATE PO Take 450 mg by mouth in the morning and at bedtime.    [provider]  MELATONIN PO Take 30 mg by mouth at bedtime.    [provider]  Menaquinone-7 (VITAMIN K2 PO) Take 150 mcg by mouth every morning.    [provider]  montelukast (SINGULAIR) 10 MG tablet Take 1 tablet by mouth at bedtime. 11/02/17   [provider]  mupirocin ointment (BACTROBAN) 2 % Apply 1 application topically 3 (three) times daily as needed.    [provider]  NALTREXONE HCL PO Take 6 mg by mouth at bedtime.    [provider]  OVER THE COUNTER MEDICATION 306 mg every morning. Estrogen control    [provider]  OVER THE COUNTER MEDICATION 2 capsules every morning. MTHFR Endure (RIH)    [provider]  PHOSPHATIDYLSERINE PO 200 mg every morning.    [provider]  Pregnenolone Micronized (PREGNENOLONE PO) Take 75 mg by mouth 3 (three) times a week.    [provider]  progesterone (PROMETRIUM) 200 MG capsule progesterone micronized 200 mg capsule  TAKE 3 CAPSULES EVERY NIGHT    [provider]  sertraline (ZOLOFT) 25 MG tablet TAKE 1 TABLET BY MOUTH EVERY DAY 11/02/20   Dohmeier, Asencion Partridge, MD  Testosterone 75 MG PLLT 75 mg every 3 (three) months. Insert 1 pellet every 3 months    [provider]  traZODone  (DESYREL) 100 MG tablet trazodone 100 mg tablet  TAKE 1-2 TABLET BY MOUTH AT BEDTIME    [provider]    Allergies    Poison oak extract [poison oak extract], Xiidra [lifitegrast], Penicillins, Sulfa antibiotics, and Sulfamethoxazole-trimethoprim  Review of Systems   Review of Systems  Constitutional:  Positive for fatigue.  Neurological:  Negative for seizures.  Psychiatric/Behavioral:  Positive for sleep disturbance.   All other systems reviewed and are negative.  Physical Exam Updated Vital Signs BP 126/83 (BP Location: Right Arm)   Pulse 73   Temp 98.8 F (37.1 C) (Oral)   Resp 14   LMP 05/09/2014   SpO2 100%   Physical Exam Vitals and nursing note reviewed.  Constitutional:      General: She is not in acute distress.    Appearance: She is not ill-appearing.  HENT:     Head: Normocephalic.  Cardiovascular:     Rate and Rhythm: Normal rate.  Pulmonary:     Effort: Pulmonary effort is normal. No respiratory distress.  Neurological:     Mental Status: She is alert.     Comments: Patient is awake and alert.  Speech is not slurred.  Normal gait.  Psychiatric:        Mood and Affect: Mood normal.        Behavior: Behavior normal.    ED Results / Procedures / Treatments   Labs (all labs ordered are listed, but only abnormal results are displayed) Labs Reviewed - No data to display  EKG None  Radiology No results found.  Procedures Procedures   Medications Ordered in ED Medications - No data to display  ED Course  I have reviewed the triage vital signs and the nursing notes.  Pertinent labs & imaging results that were available during my care of the patient were reviewed  by me and considered in my medical decision making (see chart for details).  Clinical Course as of 03/15/21 2049  Mon Mar 15, 2021  2043 Performed patient's medical screening exam at 2016 [EH]    Clinical Course User Index [EH] Ollen Gross   MDM  Rules/Calculators/A&P                          Patient is a 55 year old woman who presents today requesting a clonazepam refill. She states she has attempted to get this through her doctor however the pharmacy sent it to the wrong provider. She states that she normally takes this every day and has not been able to sleep the past week without it. Review of PDMP shows she last got a 30-day refill on 02/07/2021. She has not had any seizures and denies any other concern. On review of the system I am able to see that at team 12 her doctor sent a month refill to her preferred pharmacy, however at this point at night that pharmacy is closed. I can see pharmacy confirmed receipt of this prescription. I discussed option with patient, given that she is on chronic benzodiazepine treatment withdrawal could lead to multiple complications including seizures.  Given that she has been out of it for about a week now I do think it is reasonable to give her a prescription for a dose of 1 pill.  I offered to give this to her here however she did not have a ride home. I will send a prescription for 1 pill to the 24-hour pharmacy.  Note: Portions of this report may have been transcribed using voice recognition software. Every effort was made to ensure accuracy; however, inadvertent computerized transcription errors may be present   Final Clinical Impression(s) / ED Diagnoses Final diagnoses:  Chronic prescription benzodiazepine use  Encounter for medication refill    Rx / DC Orders ED Discharge Orders          Ordered    clonazePAM (KLONOPIN) 0.5 MG tablet   Once        03/15/21 2037             Ollen Gross 03/15/21 2049    Drenda Freeze, MD 03/15/21 2322

## 2021-03-15 NOTE — Discharge Instructions (Addendum)
As we discussed it appears that the pharmacy got your refill.   E-Prescribing Status: Receipt confirmed by pharmacy (03/15/2021  5:12 PM EST) for the refill.  As they are closed tonight I will send one dose to the 24 hour pharmacy for you.   Today you received a script for a medications that may make you sleepy or impair your ability to make decisions.  For the next 24 hours please do not drive, operate heavy machinery, care for a small child with out another adult present, or perform any activities that may cause harm to you or someone else if you were to fall asleep or be impaired.   You are being prescribed a medication which may make you sleepy. Please follow up of listed precautions for at least 24 hours after taking one dose.

## 2021-03-15 NOTE — Telephone Encounter (Signed)
Received refill request for Klonopin.  Last OV was on 01/14/20.  Next OV is scheduled for 06/16/21 .  Last RX was written on 02/07/21 for 30 tabs.   [place] [facility] has been reviewed.

## 2021-03-15 NOTE — Telephone Encounter (Signed)
Pt requesting refill for nazePAM (KLONOPIN) 0.5 MG tablet. Pharmacy [id number] - [place], [place] [address]

## 2021-03-15 NOTE — ED Triage Notes (Addendum)
Pt states that she has been out of her Clonazepam x 1 week and has not slept in a week. Pt states that there was an issue with the pharmacy and provider communication.

## 2021-03-16 DIAGNOSIS — Z1159 Encounter for screening for other viral diseases: Secondary | ICD-10-CM | POA: Diagnosis not present

## 2021-03-16 DIAGNOSIS — M129 Arthropathy, unspecified: Secondary | ICD-10-CM | POA: Diagnosis not present

## 2021-03-16 DIAGNOSIS — Z131 Encounter for screening for diabetes mellitus: Secondary | ICD-10-CM | POA: Diagnosis not present

## 2021-03-16 DIAGNOSIS — R5383 Other fatigue: Secondary | ICD-10-CM | POA: Diagnosis not present

## 2021-03-16 DIAGNOSIS — M549 Dorsalgia, unspecified: Secondary | ICD-10-CM | POA: Diagnosis not present

## 2021-03-16 DIAGNOSIS — Z79899 Other long term (current) drug therapy: Secondary | ICD-10-CM | POA: Diagnosis not present

## 2021-03-16 DIAGNOSIS — Z0130 Encounter for examination of blood pressure without abnormal findings: Secondary | ICD-10-CM | POA: Diagnosis not present

## 2021-03-16 DIAGNOSIS — Z0000 Encounter for general adult medical examination without abnormal findings: Secondary | ICD-10-CM | POA: Diagnosis not present

## 2021-03-17 DIAGNOSIS — M79641 Pain in right hand: Secondary | ICD-10-CM | POA: Diagnosis not present

## 2021-03-22 DIAGNOSIS — M1811 Unilateral primary osteoarthritis of first carpometacarpal joint, right hand: Secondary | ICD-10-CM | POA: Diagnosis not present

## 2021-03-22 DIAGNOSIS — M129 Arthropathy, unspecified: Secondary | ICD-10-CM | POA: Diagnosis not present

## 2021-03-22 DIAGNOSIS — Q796 Ehlers-Danlos syndrome, unspecified: Secondary | ICD-10-CM | POA: Diagnosis not present

## 2021-03-22 DIAGNOSIS — Z789 Other specified health status: Secondary | ICD-10-CM | POA: Diagnosis not present

## 2021-03-22 DIAGNOSIS — Z79899 Other long term (current) drug therapy: Secondary | ICD-10-CM | POA: Diagnosis not present

## 2021-03-22 DIAGNOSIS — G5603 Carpal tunnel syndrome, bilateral upper limbs: Secondary | ICD-10-CM | POA: Diagnosis not present

## 2021-03-22 DIAGNOSIS — M79641 Pain in right hand: Secondary | ICD-10-CM | POA: Diagnosis not present

## 2021-03-22 DIAGNOSIS — M549 Dorsalgia, unspecified: Secondary | ICD-10-CM | POA: Diagnosis not present

## 2021-05-07 DIAGNOSIS — N951 Menopausal and female climacteric states: Secondary | ICD-10-CM | POA: Diagnosis not present

## 2021-05-07 DIAGNOSIS — R6882 Decreased libido: Secondary | ICD-10-CM | POA: Diagnosis not present

## 2021-05-13 DIAGNOSIS — M1811 Unilateral primary osteoarthritis of first carpometacarpal joint, right hand: Secondary | ICD-10-CM | POA: Diagnosis not present

## 2021-05-13 DIAGNOSIS — G5603 Carpal tunnel syndrome, bilateral upper limbs: Secondary | ICD-10-CM | POA: Diagnosis not present

## 2021-05-13 DIAGNOSIS — Q796 Ehlers-Danlos syndrome, unspecified: Secondary | ICD-10-CM | POA: Diagnosis not present

## 2021-05-13 DIAGNOSIS — M79641 Pain in right hand: Secondary | ICD-10-CM | POA: Diagnosis not present

## 2021-05-24 ENCOUNTER — Emergency Department (HOSPITAL_COMMUNITY)
Admission: EM | Admit: 2021-05-24 | Discharge: 2021-05-24 | Disposition: A | Discharge status: Home / Self Care | Payer: BC Managed Care – PPO | Attending: Emergency Medicine | Admitting: Emergency Medicine

## 2021-05-24 ENCOUNTER — Other Ambulatory Visit: Payer: Self-pay

## 2021-05-24 ENCOUNTER — Encounter (HOSPITAL_COMMUNITY): Payer: Self-pay

## 2021-05-24 DIAGNOSIS — Z79899 Other long term (current) drug therapy: Secondary | ICD-10-CM | POA: Diagnosis not present

## 2021-05-24 DIAGNOSIS — R197 Diarrhea, unspecified: Secondary | ICD-10-CM | POA: Diagnosis not present

## 2021-05-24 DIAGNOSIS — R9431 Abnormal electrocardiogram [ECG] [EKG]: Secondary | ICD-10-CM | POA: Diagnosis not present

## 2021-05-24 LAB — CBC WITH DIFFERENTIAL/PLATELET
Abs Immature Granulocytes: 0.02 10*3/uL (ref 0.00–0.07)
Basophils Absolute: 0.1 10*3/uL (ref 0.0–0.1)
Basophils Relative: 1 %
Eosinophils Absolute: 0.2 10*3/uL (ref 0.0–0.5)
Eosinophils Relative: 2 %
HCT: 43.2 % (ref 36.0–46.0)
Hemoglobin: 14.8 g/dL (ref 12.0–15.0)
Immature Granulocytes: 0 %
Lymphocytes Relative: 13 %
Lymphs Abs: 0.9 10*3/uL (ref 0.7–4.0)
MCH: 31.3 pg (ref 26.0–34.0)
MCHC: 34.3 g/dL (ref 30.0–36.0)
MCV: 91.3 fL (ref 80.0–100.0)
Monocytes Absolute: 0.6 10*3/uL (ref 0.1–1.0)
Monocytes Relative: 9 %
Neutro Abs: 5.1 10*3/uL (ref 1.7–7.7)
Neutrophils Relative %: 75 %
Platelets: 276 10*3/uL (ref 150–400)
RBC: 4.73 MIL/uL (ref 3.87–5.11)
RDW: 12.2 % (ref 11.5–15.5)
WBC: 6.9 10*3/uL (ref 4.0–10.5)
nRBC: 0 % (ref 0.0–0.2)

## 2021-05-24 LAB — COMPREHENSIVE METABOLIC PANEL
ALT: 27 U/L (ref 0–44)
AST: 22 U/L (ref 15–41)
Albumin: 3.9 g/dL (ref 3.5–5.0)
Alkaline Phosphatase: 50 U/L (ref 38–126)
Anion gap: 8 (ref 5–15)
BUN: 15 mg/dL (ref 6–20)
CO2: 22 mmol/L (ref 22–32)
Calcium: 8.8 mg/dL — ABNORMAL LOW (ref 8.9–10.3)
Chloride: 107 mmol/L (ref 98–111)
Creatinine, Ser: 0.78 mg/dL (ref 0.44–1.00)
GFR, Estimated: >60 mL/min (ref >60)
Glucose, Bld: 100 mg/dL — ABNORMAL HIGH (ref 70–99)
Potassium: 3.5 mmol/L (ref 3.5–5.1)
Sodium: 137 mmol/L (ref 135–145)
Total Bilirubin: 0.4 mg/dL (ref 0.3–1.2)
Total Protein: 6.7 g/dL (ref 6.5–8.1)

## 2021-05-24 LAB — CK: Total CK: 60 U/L (ref 38–234)

## 2021-05-24 MED ORDER — DICYCLOMINE HCL 10 MG PO CAPS
10.0000 mg | ORAL_CAPSULE | Freq: Once | ORAL | Status: AC
  Administered 2021-05-24: 10 mg via ORAL
  Filled 2021-05-24: qty 1

## 2021-05-24 MED ORDER — HYOSCYAMINE SULFATE SL 0.125 MG SL SUBL: 0.1250 mg | SUBLINGUAL_TABLET | Freq: Four times a day (QID) | SUBLINGUAL | 0 refills | Status: DC | PRN

## 2021-05-24 MED ORDER — DICYCLOMINE HCL 20 MG PO TABS: 20.0000 mg | ORAL_TABLET | Freq: Two times a day (BID) | ORAL | 0 refills | Status: DC

## 2021-05-24 MED ORDER — SODIUM CHLORIDE 0.9 % IV BOLUS
1000.0000 mL | Freq: Once | INTRAVENOUS | Status: AC
  Administered 2021-05-24: 1000 mL via INTRAVENOUS

## 2021-05-24 NOTE — ED Notes (Signed)
Poison control notified, they recommend a CK lab be drawn and obs x 2 hrs.

## 2021-05-24 NOTE — ED Provider Notes (Signed)
[facility] DEPT Provider Note   CSN: [id number] Arrival date & time: 05/24/21  0407     History  Chief Complaint  Patient presents with   Diarrhea    Lindsey [name] is a 56 y.o. female.  56 year old female with prior medical history as detailed below presents for evaluation.  Patient reports onset history of IBS.  Patient reports continued difficulty with diarrhea over the last several weeks.  Patient's symptoms were worse in the last week.  She does report that she has taken 24  (2 mg) tablets of Imodium over the last 24 hours in an attempt to control her diarrhea.  She denies intent to harm self. She denies other complaint.   She denies fever.  The history is provided by the patient and medical records.  Diarrhea Quality:  Explosive Severity:  Moderate Onset quality:  Gradual Duration:  2 weeks Timing:  Constant Progression:  Unchanged Relieved by:  Nothing     Home Medications Prior to Admission medications   Medication Sig Start Date End Date Taking? Authorizing Provider  Acetaminophen (TYLENOL 8 HOUR ARTHRITIS PAIN PO) Take 1,300 mg by mouth in the morning and at bedtime.    [provider]  Albuterol Sulfate (PROAIR RESPICLICK) 093 (90 Base) MCG/ACT AEPB Inhale into the lungs as needed.    [provider]  Ascorbic Acid (VITAMIN C PO) Take 1,200 mg by mouth 2 (two) times daily.     [provider]  CHITOSAN PO Take 1,000 mg by mouth in the morning and at bedtime.    [provider]  Cholecalciferol (VITAMIN D3) 1.25 MG (50000 UT) TABS Take 1 tablet by mouth once a week.    [provider]  clonazePAM (KLONOPIN) 0.5 MG tablet Take 1 tablet (0.5 mg total) by mouth at bedtime. 03/15/21   Dohmeier, Asencion Partridge, MD  clonazePAM (KLONOPIN) 0.5 MG tablet Take 1 tablet (0.5 mg total) by mouth once for 1 dose. 03/15/21 03/15/21  Lorin Glass, PA-C  cycloSPORINE (RESTASIS) 0.05 % ophthalmic  emulsion Place 1 drop into both eyes 2 (two) times daily.    [provider]  estradiol (VIVELLE-DOT) 0.075 MG/24HR Place onto the skin every 3 (three) days. 12/20/20   [provider]  FERROUS BISGLYCINATE CHELATE PO Take 18 mg by mouth daily.    [provider]  fexofenadine (ALLEGRA) 180 MG tablet Take 180 mg by mouth daily.    [provider]  folic acid (FOLVITE) 818 MCG tablet Take 800 mcg by mouth daily.    [provider]  gabapentin (NEURONTIN) 300 MG capsule Take 300 mg by mouth at bedtime. 04/17/14   [provider]  ipratropium (ATROVENT) 0.06 % nasal spray Place 2 sprays into both nostrils 2 (two) times daily as needed.    [provider]  LEVOCETIRIZINE DIHYDROCHLORIDE PO Take 5 mg by mouth at bedtime.    [provider]  MAGNESIUM MALATE PO Take 450 mg by mouth in the morning and at bedtime.    [provider]  MELATONIN PO Take 30 mg by mouth at bedtime.    [provider]  Menaquinone-7 (VITAMIN K2 PO) Take 150 mcg by mouth every morning.    [provider]  montelukast (SINGULAIR) 10 MG tablet Take 1 tablet by mouth at bedtime. 11/02/17   [provider]  mupirocin ointment (BACTROBAN) 2 % Apply 1 application topically 3 (three) times daily as needed.    [provider]  NALTREXONE HCL PO Take 6 mg by mouth at bedtime.    [provider]  OVER THE COUNTER MEDICATION 306 mg every morning. Estrogen control    [provider]  OVER THE COUNTER MEDICATION 2 capsules every morning. MTHFR Endure (RIH)    [provider]  PHOSPHATIDYLSERINE PO 200 mg every morning.    [provider]  Pregnenolone Micronized (PREGNENOLONE PO) Take 75 mg by mouth 3 (three) times a week.    [provider]  progesterone (PROMETRIUM) 200 MG capsule progesterone micronized 200 mg capsule  TAKE 3 CAPSULES EVERY NIGHT    [provider]   sertraline (ZOLOFT) 25 MG tablet TAKE 1 TABLET BY MOUTH EVERY DAY 11/02/20   Dohmeier, Asencion Partridge, MD  Testosterone 75 MG PLLT 75 mg every 3 (three) months. Insert 1 pellet every 3 months    [provider]  traZODone (DESYREL) 100 MG tablet trazodone 100 mg tablet  TAKE 1-2 TABLET BY MOUTH AT BEDTIME    [provider]      Allergies    Poison oak extract [poison oak extract], Xiidra [lifitegrast], Penicillins, Sulfa antibiotics, and Sulfamethoxazole-trimethoprim    Review of Systems   Review of Systems  Gastrointestinal:  Positive for diarrhea.  All other systems reviewed and are negative.  Physical Exam Updated Vital Signs BP (!) 92/57    Pulse (!) 56    Temp 98.2 F (36.8 C) (Oral)    Resp 15    Ht 5\' 6"  (1.676 m)    Wt 68 kg    LMP 05/09/2014    SpO2 97%    BMI 24.21 kg/m  Physical Exam Vitals and nursing note reviewed.  Constitutional:      General: She is not in acute distress.    Appearance: Normal appearance. She is well-developed.  HENT:     Head: Normocephalic and atraumatic.  Eyes:     Conjunctiva/sclera: Conjunctivae normal.     Pupils: Pupils are equal, round, and reactive to light.  Cardiovascular:     Rate and Rhythm: Normal rate and regular rhythm.     Heart sounds: Normal heart sounds.  Pulmonary:     Effort: Pulmonary effort is normal. No respiratory distress.     Breath sounds: Normal breath sounds.  Abdominal:     General: There is no distension.     Palpations: Abdomen is soft.     Tenderness: There is no abdominal tenderness.  Musculoskeletal:        General: No deformity. Normal range of motion.     Cervical back: Normal range of motion and neck supple.  Skin:    General: Skin is warm and dry.  Neurological:     General: No focal deficit present.     Mental Status: She is alert and oriented to person, place, and time.    ED Results / Procedures / Treatments   Labs (all labs ordered are listed, but only abnormal results are  displayed) Labs Reviewed  COMPREHENSIVE METABOLIC PANEL - Abnormal; Notable for the following components:      Result Value   Glucose, Bld 100 (*)    Calcium 8.8 (*)    All other components within normal limits  CBC WITH DIFFERENTIAL/PLATELET  CK    EKG EKG Interpretation  Date/Time:  Monday May 24 2021 04:53:17 EST Ventricular Rate:  71 PR Interval:  167 QRS Duration: 98 QT Interval:  399 QTC Calculation: 434 R Axis:   42 Text Interpretation: Sinus rhythm Left  atrial enlargement RSR' in V1 or V2, right VCD or RVH Confirmed by Dene Gentry [phone]) on 05/24/2021 7:45:30 AM  Radiology No results found.  Procedures Procedures    Medications Ordered in ED Medications  sodium chloride 0.9 % bolus 1,000 mL (1,000 mLs Intravenous New Bag/Given 05/24/21 0816)  dicyclomine (BENTYL) capsule 10 mg (10 mg Oral Given 05/24/21 0815)    ED Course/ Medical Decision Making/ A&P                           Medical Decision Making Amount and/or Complexity of Data Reviewed Labs: ordered.  Risk Prescription drug management.    Medical Screen Complete  This patient presented to the ED with complaint of diarrhea.  This complaint involves an extensive number of treatment options. The initial differential diagnosis includes, but is not limited to, chronic diarrhea, IBS metabolic abnormality, dehydration  This presentation is: Acute, Chronic, Self-Limited, Previously Undiagnosed, Uncertain Prognosis, Complicated, Systemic Symptoms, and Threat to Life/Bodily Function  Is presenting with complaint of chronic diarrhea.  Patient reports of symptoms are secondary to IBS  Patient reports worsening symptoms over the last week.  She requests refill on Bentyl and Levsin.  Patient is improved after ED evaluation and IV fluids.  She desires DC home.  She does understand need for close follow-up.  She is advised to not use Imodium tablets excessively.  Importance of close follow-up is  stressed.  Strict return precautions given and understood.  Co morbidities that complicated the patient's evaluation  Reported history of IBS   Additional history obtained:  External records from outside sources obtained and reviewed including prior ED visits and prior Inpatient records.    Lab Tests:  I ordered and personally interpreted labs.  The pertinent results include: CBC, CMP, CK   Cardiac Monitoring:  The patient was maintained on a cardiac monitor.  I personally viewed and interpreted the cardiac monitor which showed an underlying rhythm of: NSR   Medicines ordered:  I ordered medication including Bentyl, IV fluid for normal cramps, dehydration Reevaluation of the patient after these medicines showed that the patient: improved   Problem List / ED Course:  Diarrhea   Reevaluation:  After the interventions noted above, I reevaluated the patient and found that they have: improved   Disposition:  After consideration of the diagnostic results and the patients response to treatment, I feel that the patent would benefit from close outpatient follow-up.          Final Clinical Impression(s) / ED Diagnoses Final diagnoses:  Diarrhea, unspecified type    Rx / DC Orders ED Discharge Orders          Ordered    dicyclomine (BENTYL) 20 MG tablet  2 times daily        05/24/21 1005    Hyoscyamine Sulfate SL (LEVSIN/SL) 0.125 MG SUBL  Every 6 hours PRN        05/24/21 1005              Valarie Merino, MD 05/24/21 1008

## 2021-05-24 NOTE — ED Triage Notes (Signed)
Pt states that her IBS symptoms have been getting worse. Pt reports explosive diarrhea over the past week. Pt reports taking 24 (2mg ) Imodium tablets over 24 hrs.

## 2021-05-24 NOTE — Discharge Instructions (Signed)
Return for any problem.  ?

## 2021-05-25 DIAGNOSIS — E039 Hypothyroidism, unspecified: Secondary | ICD-10-CM | POA: Diagnosis not present

## 2021-05-25 DIAGNOSIS — M25512 Pain in left shoulder: Secondary | ICD-10-CM | POA: Diagnosis not present

## 2021-05-25 DIAGNOSIS — R197 Diarrhea, unspecified: Secondary | ICD-10-CM | POA: Diagnosis not present

## 2021-05-25 DIAGNOSIS — E538 Deficiency of other specified B group vitamins: Secondary | ICD-10-CM | POA: Diagnosis not present

## 2021-06-03 DIAGNOSIS — G5603 Carpal tunnel syndrome, bilateral upper limbs: Secondary | ICD-10-CM | POA: Diagnosis not present

## 2021-06-03 DIAGNOSIS — M79641 Pain in right hand: Secondary | ICD-10-CM | POA: Diagnosis not present

## 2021-06-03 DIAGNOSIS — Q796 Ehlers-Danlos syndrome, unspecified: Secondary | ICD-10-CM | POA: Diagnosis not present

## 2021-06-03 DIAGNOSIS — M1811 Unilateral primary osteoarthritis of first carpometacarpal joint, right hand: Secondary | ICD-10-CM | POA: Diagnosis not present

## 2021-06-04 DIAGNOSIS — R197 Diarrhea, unspecified: Secondary | ICD-10-CM | POA: Diagnosis not present

## 2021-06-04 DIAGNOSIS — Q796 Ehlers-Danlos syndrome, unspecified: Secondary | ICD-10-CM | POA: Diagnosis not present

## 2021-06-08 ENCOUNTER — Emergency Department (HOSPITAL_COMMUNITY)
Admission: EM | Admit: 2021-06-08 | Discharge: 2021-06-08 | Disposition: A | Discharge status: Home / Self Care | Payer: BC Managed Care – PPO | Attending: Emergency Medicine | Admitting: Emergency Medicine

## 2021-06-08 ENCOUNTER — Emergency Department (HOSPITAL_COMMUNITY): Payer: BC Managed Care – PPO

## 2021-06-08 ENCOUNTER — Other Ambulatory Visit: Payer: Self-pay

## 2021-06-08 ENCOUNTER — Encounter (HOSPITAL_COMMUNITY): Payer: Self-pay

## 2021-06-08 DIAGNOSIS — J45909 Unspecified asthma, uncomplicated: Secondary | ICD-10-CM | POA: Diagnosis not present

## 2021-06-08 DIAGNOSIS — R197 Diarrhea, unspecified: Secondary | ICD-10-CM | POA: Diagnosis not present

## 2021-06-08 DIAGNOSIS — R112 Nausea with vomiting, unspecified: Secondary | ICD-10-CM | POA: Diagnosis not present

## 2021-06-08 DIAGNOSIS — R1084 Generalized abdominal pain: Secondary | ICD-10-CM | POA: Insufficient documentation

## 2021-06-08 DIAGNOSIS — R109 Unspecified abdominal pain: Secondary | ICD-10-CM | POA: Diagnosis not present

## 2021-06-08 DIAGNOSIS — E860 Dehydration: Secondary | ICD-10-CM | POA: Diagnosis not present

## 2021-06-08 LAB — COMPREHENSIVE METABOLIC PANEL
ALT: 21 U/L (ref 0–44)
AST: 20 U/L (ref 15–41)
Albumin: 3.9 g/dL (ref 3.5–5.0)
Alkaline Phosphatase: 49 U/L (ref 38–126)
Anion gap: 8 (ref 5–15)
BUN: 14 mg/dL (ref 6–20)
CO2: 21 mmol/L — ABNORMAL LOW (ref 22–32)
Calcium: 8.9 mg/dL (ref 8.9–10.3)
Chloride: 108 mmol/L (ref 98–111)
Creatinine, Ser: 0.85 mg/dL (ref 0.44–1.00)
GFR, Estimated: >60 mL/min (ref >60)
Glucose, Bld: 99 mg/dL (ref 70–99)
Potassium: 3.6 mmol/L (ref 3.5–5.1)
Sodium: 137 mmol/L (ref 135–145)
Total Bilirubin: 0.5 mg/dL (ref 0.3–1.2)
Total Protein: 7.2 g/dL (ref 6.5–8.1)

## 2021-06-08 LAB — URINALYSIS, ROUTINE W REFLEX MICROSCOPIC
Bilirubin Urine: NEGATIVE
Glucose, UA: NEGATIVE mg/dL
Hgb urine dipstick: NEGATIVE
Ketones, ur: NEGATIVE mg/dL
Leukocytes,Ua: NEGATIVE
Nitrite: NEGATIVE
Protein, ur: NEGATIVE mg/dL
Specific Gravity, Urine: 1.005 (ref 1.005–1.030)
pH: 7 (ref 5.0–8.0)

## 2021-06-08 LAB — CBC
HCT: 45.2 % (ref 36.0–46.0)
Hemoglobin: 15.6 g/dL — ABNORMAL HIGH (ref 12.0–15.0)
MCH: 31.3 pg (ref 26.0–34.0)
MCHC: 34.5 g/dL (ref 30.0–36.0)
MCV: 90.6 fL (ref 80.0–100.0)
Platelets: 331 10*3/uL (ref 150–400)
RBC: 4.99 MIL/uL (ref 3.87–5.11)
RDW: 12.1 % (ref 11.5–15.5)
WBC: 7.8 10*3/uL (ref 4.0–10.5)
nRBC: 0 % (ref 0.0–0.2)

## 2021-06-08 LAB — MAGNESIUM: Magnesium: 2.2 mg/dL (ref 1.7–2.4)

## 2021-06-08 LAB — C DIFFICILE QUICK SCREEN W PCR REFLEX
C Diff antigen: NEGATIVE
C Diff interpretation: NOT DETECTED
C Diff toxin: NEGATIVE

## 2021-06-08 LAB — LIPASE, BLOOD: Lipase: 114 U/L — ABNORMAL HIGH (ref 11–51)

## 2021-06-08 MED ORDER — DICYCLOMINE HCL 10 MG/ML IM SOLN
20.0000 mg | Freq: Once | INTRAMUSCULAR | Status: AC
  Administered 2021-06-08: 20 mg via INTRAMUSCULAR
  Filled 2021-06-08: qty 2

## 2021-06-08 MED ORDER — PROMETHAZINE HCL 25 MG PO TABS: 25.0000 mg | ORAL_TABLET | Freq: Four times a day (QID) | ORAL | 0 refills | Status: DC | PRN

## 2021-06-08 MED ORDER — SODIUM CHLORIDE 0.9 % IV BOLUS
1000.0000 mL | Freq: Once | INTRAVENOUS | Status: AC
  Administered 2021-06-08: 1000 mL via INTRAVENOUS

## 2021-06-08 MED ORDER — IOHEXOL 300 MG/ML  SOLN
100.0000 mL | Freq: Once | INTRAMUSCULAR | Status: AC | PRN
  Administered 2021-06-08: 100 mL via INTRAVENOUS

## 2021-06-08 MED ORDER — DROPERIDOL 2.5 MG/ML IJ SOLN
2.5000 mg | Freq: Once | INTRAMUSCULAR | Status: AC
  Administered 2021-06-08: 2.5 mg via INTRAVENOUS
  Filled 2021-06-08: qty 2

## 2021-06-08 MED ORDER — LACTATED RINGERS IV BOLUS: 1000.0000 mL | Freq: Once | INTRAVENOUS | Status: DC

## 2021-06-08 MED ORDER — LIDOCAINE VISCOUS HCL 2 % MT SOLN
15.0000 mL | Freq: Once | OROMUCOSAL | Status: AC
Start: 2021-06-08 — End: 2021-06-08
  Administered 2021-06-08: 15 mL via ORAL
  Filled 2021-06-08: qty 15

## 2021-06-08 MED ORDER — ALUM & MAG HYDROXIDE-SIMETH 200-200-20 MG/5ML PO SUSP
30.0000 mL | Freq: Once | ORAL | Status: AC
  Administered 2021-06-08: 30 mL via ORAL
  Filled 2021-06-08: qty 30

## 2021-06-08 NOTE — ED Notes (Signed)
Re-called [facility] for call back to [facility] MD

## 2021-06-08 NOTE — ED Triage Notes (Signed)
Patient reports uncontrollable diarrhea, abdominal pain, and vomiting x 6 months. Patient states symptoms worse in the past few weeks.

## 2021-06-08 NOTE — ED Provider Notes (Signed)
[facility] DEPT Provider Note   CSN: [id number] Arrival date & time: 06/08/21  1404     History  Chief Complaint  Patient presents with   Diarrhea   Emesis   Abdominal Pain    Lindsey [name] is a 56 y.o. female.   Diarrhea Associated symptoms: abdominal pain and vomiting   Emesis Associated symptoms: abdominal pain and diarrhea   Abdominal Pain Associated symptoms: diarrhea, nausea and vomiting      56 year old female with a hx of IBS, fibromyalgia, carpal tunnel syndrome, Ehlers-Danlos syndrome, hemorrhoids status post banding, asthma who presents to the ED with uncontrolled diarrhea, nausea and vomiting. She has had these symptoms for at least 8 months. Has followed outpatient with GI for this.  Recently got a new PCP had her IBS medications refilled to include Hycosamine BID, Bentyl. She has seen an allergist and had testing which revealed a milk and egg allergy. Has an upcoming appointment with [facility].   Her PCP recommended she undergo CT scan to evaluate for Crohn's or UC? [facility] also had recommended an UGI evaluation. She has been trying Imodium previously.   She endorses generalized abdominal pain and cramping. She endorses profuse watery diarrhea, roughly 10 episodes daily, for the past 2 weeks. She feels dehydrated. She had two episodes of NBNB emesis yesterday. She states there is quite a bit of mucous in her stool. No fevers.   She drinks frequently visits her mother in [place] who has a freshwater well source that has been untreated and was placed in the 68s. She is not certain if she is continually being exposed to a parasite through that water. No recent antibiotic exposure.   Home Medications Prior to Admission medications   Medication Sig Start Date End Date Taking? Authorizing Provider  promethazine (PHENERGAN) 25 MG tablet Take 1 tablet (25 mg total) by mouth every 6 (six) hours as needed for nausea or vomiting.  06/08/21  Yes Regan Lemming, MD  Acetaminophen (TYLENOL 8 HOUR ARTHRITIS PAIN PO) Take 1,300 mg by mouth in the morning and at bedtime.    [provider]  Albuterol Sulfate (PROAIR RESPICLICK) 366 (90 Base) MCG/ACT AEPB Inhale into the lungs as needed.    [provider]  Ascorbic Acid (VITAMIN C PO) Take 1,200 mg by mouth 2 (two) times daily.     [provider]  CHITOSAN PO Take 1,000 mg by mouth in the morning and at bedtime.    [provider]  Cholecalciferol (VITAMIN D3) 1.25 MG (50000 UT) TABS Take 1 tablet by mouth once a week.    [provider]  clonazePAM (KLONOPIN) 0.5 MG tablet Take 1 tablet (0.5 mg total) by mouth at bedtime. 03/15/21   Dohmeier, Asencion Partridge, MD  clonazePAM (KLONOPIN) 0.5 MG tablet Take 1 tablet (0.5 mg total) by mouth once for 1 dose. 03/15/21 03/15/21  Lorin Glass, PA-C  cycloSPORINE (RESTASIS) 0.05 % ophthalmic emulsion Place 1 drop into both eyes 2 (two) times daily.    [provider]  dicyclomine (BENTYL) 20 MG tablet Take 1 tablet (20 mg total) by mouth 2 (two) times daily. 05/24/21   Valarie Merino, MD  estradiol (VIVELLE-DOT) 0.075 MG/24HR Place onto the skin every 3 (three) days. 12/20/20   [provider]  FERROUS BISGLYCINATE CHELATE PO Take 18 mg by mouth daily.    [provider]  fexofenadine (ALLEGRA) 180 MG tablet Take 180 mg by mouth daily.  [provider]  folic acid (FOLVITE) 222 MCG tablet Take 800 mcg by mouth daily.    [provider]  gabapentin (NEURONTIN) 300 MG capsule Take 300 mg by mouth at bedtime. 04/17/14   [provider]  Hyoscyamine Sulfate SL (LEVSIN/SL) 0.125 MG SUBL Place 0.125 mg under the tongue every 6 (six) hours as needed. 05/24/21   Valarie Merino, MD  ipratropium (ATROVENT) 0.06 % nasal spray Place 2 sprays into both nostrils 2 (two) times daily as needed.    [provider]  LEVOCETIRIZINE DIHYDROCHLORIDE PO Take  5 mg by mouth at bedtime.    [provider]  MAGNESIUM MALATE PO Take 450 mg by mouth in the morning and at bedtime.    [provider]  MELATONIN PO Take 30 mg by mouth at bedtime.    [provider]  Menaquinone-7 (VITAMIN K2 PO) Take 150 mcg by mouth every morning.    [provider]  montelukast (SINGULAIR) 10 MG tablet Take 1 tablet by mouth at bedtime. 11/02/17   [provider]  mupirocin ointment (BACTROBAN) 2 % Apply 1 application topically 3 (three) times daily as needed.    [provider]  NALTREXONE HCL PO Take 6 mg by mouth at bedtime.    [provider]  OVER THE COUNTER MEDICATION 306 mg every morning. Estrogen control    [provider]  OVER THE COUNTER MEDICATION 2 capsules every morning. MTHFR Endure (RIH)    [provider]  PHOSPHATIDYLSERINE PO 200 mg every morning.    [provider]  Pregnenolone Micronized (PREGNENOLONE PO) Take 75 mg by mouth 3 (three) times a week.    [provider]  progesterone (PROMETRIUM) 200 MG capsule progesterone micronized 200 mg capsule  TAKE 3 CAPSULES EVERY NIGHT    [provider]  sertraline (ZOLOFT) 25 MG tablet TAKE 1 TABLET BY MOUTH EVERY DAY 11/02/20   Dohmeier, Asencion Partridge, MD  Testosterone 75 MG PLLT 75 mg every 3 (three) months. Insert 1 pellet every 3 months    [provider]  traZODone (DESYREL) 100 MG tablet trazodone 100 mg tablet  TAKE 1-2 TABLET BY MOUTH AT BEDTIME    [provider]      Allergies    Poison oak extract [poison oak extract], Xiidra [lifitegrast], Penicillins, Sulfa antibiotics, and Sulfamethoxazole-trimethoprim    Review of Systems   Review of Systems  Gastrointestinal:  Positive for abdominal pain, diarrhea, nausea and vomiting.  All other systems reviewed and are negative.  Physical Exam Updated Vital Signs BP 120/76    Pulse 85    Temp 98.4 F (36.9 C) (Oral)    Resp 16    Ht  5\' 6"  (1.676 m)    Wt 63.5 kg    LMP 05/09/2014    SpO2 98%    BMI 22.60 kg/m  Physical Exam Vitals and nursing note reviewed.  Constitutional:      General: She is not in acute distress. HENT:     Head: Normocephalic and atraumatic.  Eyes:     Conjunctiva/sclera: Conjunctivae normal.     Pupils: Pupils are equal, round, and reactive to light.  Cardiovascular:     Rate and Rhythm: Normal rate and regular rhythm.  Pulmonary:     Effort: Pulmonary effort is normal. No respiratory distress.  Abdominal:     General: There is no distension.     Tenderness: There is generalized abdominal tenderness. There is no guarding or rebound.  Musculoskeletal:        General: No deformity or signs of injury.     Cervical back: Neck supple.  Skin:    Findings: No lesion or rash.  Neurological:     General: No focal deficit present.     Mental Status: She is alert. Mental status is at baseline.    ED Results / Procedures / Treatments   Labs (all labs ordered are listed, but only abnormal results are displayed) Labs Reviewed  LIPASE, BLOOD - Abnormal; Notable for the following components:      Result Value   Lipase 114 (*)    All other components within normal limits  COMPREHENSIVE METABOLIC PANEL - Abnormal; Notable for the following components:   CO2 21 (*)    All other components within normal limits  CBC - Abnormal; Notable for the following components:   Hemoglobin 15.6 (*)    All other components within normal limits  URINALYSIS, ROUTINE W REFLEX MICROSCOPIC - Abnormal; Notable for the following components:   Color, Urine STRAW (*)    All other components within normal limits  C DIFFICILE QUICK SCREEN W PCR REFLEX    GASTROINTESTINAL PANEL BY PCR, STOOL (REPLACES STOOL CULTURE)  MAGNESIUM    EKG None  Radiology CT ABDOMEN PELVIS W CONTRAST  Result Date: 06/08/2021 CLINICAL DATA:  Abdominal pain, diarrhea EXAM: CT ABDOMEN AND PELVIS WITH CONTRAST TECHNIQUE: Multidetector CT  imaging of the abdomen and pelvis was performed using the standard protocol following bolus administration of intravenous contrast. RADIATION DOSE REDUCTION: This exam was performed according to the departmental dose-optimization program which includes automated exposure control, adjustment of the mA and/or kV according to patient size and/or use of iterative reconstruction technique. CONTRAST:  142mL OMNIPAQUE IOHEXOL 300 MG/ML  SOLN COMPARISON:  07/11/2020 FINDINGS: Lower chest: There are small linear densities in the lower lung fields suggesting scarring or subsegmental atelectasis. Liver measures 17.6 cm in length. There is no focal consolidation. Hepatobiliary: Liver measures 17.6 cm in length. There are few smooth marginated low-density lesions of varying sizes in the liver with no significant change. Largest of these lesions is noted in the posterior dome of right lobe measuring 2.4 cm. There is no dilation of bile ducts. Gallbladder is not distended. There is subtle increased density in the dependent portion of gallbladder suggesting possible sludge and/or stones. Pancreas: There is slight prominence of pancreatic duct. No focal abnormality is seen. Spleen: Unremarkable. Adrenals/Urinary Tract: Adrenals are unremarkable. There is no hydronephrosis. There are no renal or ureteral stones. Urinary bladder is unremarkable. Stomach/Bowel: Stomach is not distended. Small bowel loops are not dilated. Appendix is not distinctly seen. There is no focal pericecal inflammation. There is mild diffuse wall thickening in the colon. There is no pericolic stranding or fluid collection. There is mild diffuse wall thickening in the rectum. Vascular/Lymphatic: Unremarkable. Reproductive: Uterus is enlarged with fibroids. Largest of the fibroids measures proximally 5.2 cm. Other: There is no ascites or pneumoperitoneum. Small umbilical hernia containing fat is seen. Musculoskeletal: Unremarkable. IMPRESSION: There is mild  diffuse wall thickening in colon and rectum suggesting possible inflammatory or infectious colitis. There is no loculated pericolic fluid collection. There is no evidence of intestinal obstruction or pneumoperitoneum. There is no hydronephrosis. There are few small smooth marginated low-density lesions in the liver which have not changed significantly, possibly suggesting cysts or hemangiomas. Liver is enlarged in size. Possible gallbladder stones. Uterine fibroids. Other findings as described in the body of the report. Electronically Signed  By: Elmer Picker M.D.   On: 06/08/2021 16:27    Procedures Procedures    Medications Ordered in ED Medications  sodium chloride 0.9 % bolus 1,000 mL (0 mLs Intravenous Stopped 06/08/21 1600)  dicyclomine (BENTYL) injection 20 mg (20 mg Intramuscular Given 06/08/21 1504)  droperidol (INAPSINE) 2.5 MG/ML injection 2.5 mg (2.5 mg Intravenous Given 06/08/21 1655)  alum & mag hydroxide-simeth (MAALOX/MYLANTA) 200-200-20 MG/5ML suspension 30 mL (30 mLs Oral Given 06/08/21 1654)    And  lidocaine (XYLOCAINE) 2 % viscous mouth solution 15 mL (15 mLs Oral Given 06/08/21 1654)  iohexol (OMNIPAQUE) 300 MG/ML solution 100 mL (100 mLs Intravenous Contrast Given 06/08/21 1609)    ED Course/ Medical Decision Making/ A&P Clinical Course as of 06/08/21 2114  Tue Jun 08, 2021  1704 Lipase(!): 114 [JL]    Clinical Course User Index [JL] Regan Lemming, MD                           Medical Decision Making Amount and/or Complexity of Data Reviewed Labs: ordered. Decision-making details documented in ED Course.  Risk OTC drugs. Prescription drug management.   55 year old female with a hx of IBS, fibromyalgia, carpal tunnel syndrome, Ehlers-Danlos syndrome, hemorrhoids status post banding, asthma who presents to the ED with uncontrolled diarrhea, nausea and vomiting. She has had these symptoms for at least 8 months. Has followed outpatient with GI for  this.  Recently got a new PCP had her IBS medications refilled to include Hycosamine BID, Bentyl. She has seen an allergist and had testing which revealed a milk and egg allergy. Has an upcoming appointment with [facility].   Her PCP recommended she undergo CT scan to evaluate for Crohn's or UC? [facility] also had recommended an UGI evaluation. She has been trying Imodium previously.   She endorses generalized abdominal pain and cramping. She endorses profuse watery diarrhea, roughly 10 episodes daily, for the past 2 weeks. She feels dehydrated. She had two episodes of NBNB emesis yesterday. She states there is quite a bit of mucous in her stool. No fevers.   She drinks frequently visits her mother in [place] who has a freshwater well source that has been untreated and was placed in the 1s. She is not certain if she is continually being exposed to a parasite through that water. No recent antibiotic exposure.   On arrival, the patient was afebrile, tachycardic P105, mildly hypertensive BP 138/91, saturating 98% on room air.  On my evaluation, the patient had received 400 cc of IV fluids and had normal sinus rhythm noted on cardiac telemetry.  She presents with generalized abdominal tenderness with, nausea, vomiting, diarrhea that appears to be acute on chronic.  She is followed outpatient with gastroenterology with a fairly negative work-up so far.  She denies any hematemesis, hematochezia, melena.   Mucous membranes did appear dry on exam.  Differential diagnosis includes functional diarrhea, C. difficile, infectious colitis, pancreatitis, appendicitis, diverticulitis, small bowel obstruction.  The patient was administered Bentyl, and IV fluid bolus 1 L normal saline, viscous lidocaine and Maalox, IV droperidol 2.5 mg with subsequent resolution in her symptoms.  Screening laboratory work-up was significant for CBC without leukocytosis, evidence of dehydration with hemoconcentration with a  hemoglobin of 15.6, elevated lipase to 114, CMP without significant electrolyte abnormality, not anion gap acidosis with a bicarb of 21, anion gap of 8, normal renal and liver function, C. difficile PCR testing negative, magnesium  normal at 2.2.  The patient underwent CT of the abdomen pelvis which revealed thickening of the entire colon concerning for colitis.  No other abnormalities were noted beyond mild dilation of the patient's pancreatic duct with no surrounding pancreatic head stranding to indicate pancreatitis.  I did speak with on-call [facility] gastro[facility]ology Ellouise Newer and discussed the case with her.  She did not see an indication for acute intervention with the elevated lipase and dilated pancreatic duct with no other evidence of pancreatitis or acute peripancreatic stranding or inflammatory changes.  No indication for emergent ERCP with normal biliary labs per GI.  CT imaging: IMPRESSION:  There is mild diffuse wall thickening in colon and rectum suggesting  possible inflammatory or infectious colitis. There is no loculated  pericolic fluid collection.     There is no evidence of intestinal obstruction or pneumoperitoneum.  There is no hydronephrosis.     There are few small smooth marginated low-density lesions in the  liver which have not changed significantly, possibly suggesting  cysts or hemangiomas. Liver is enlarged in size. Possible  gallbladder stones. Uterine fibroids.     Other findings as described in the body of the report.    The patient on reassessment was symptomatically improved and tolerating oral intake drinking a bottle of Gatorade bedside.  Symptoms are consistent with likely functional diarrhea versus IBS versus current acute gastroenteritis.    The patient has been appropriately medically screened and/or stabilized in the ED. I have low suspicion for any other emergent medical condition which would require further screening, evaluation or treatment in  the ED or require inpatient management.  The patient appeared rehydrated and vitally stable on reassessment.  She is tolerating oral intake and able to continue rehydration at home.  She has a follow-up appointment with gastroenterology in clinic scheduled for 2 weeks from now to discuss further evaluation outpatient of her chronic diarrhea.  Advised continued oral rehydration at home.  Provided prescription for promethazine for nausea. Stable for DC.     Final Clinical Impression(s) / ED Diagnoses Final diagnoses:  Nausea vomiting and diarrhea    Rx / DC Orders ED Discharge Orders          Ordered    promethazine (PHENERGAN) 25 MG tablet  Every 6 hours PRN        06/08/21 2022              Regan Lemming, MD 06/08/21 2116

## 2021-06-08 NOTE — ED Provider Triage Note (Signed)
Emergency Medicine Provider Triage Evaluation Note  Lindsey [name] , a 56 y.o. female  was evaluated in triage.  Pt complains of diarrhea, nausea, vomiting and abdominal pain for the last 10 months.  Patient states she has been evaluated by primary care, GI with no results or answers thus far.  Patient reports of the last 1 week her symptoms have worsened.  She denies blood in vomit or stool.  Review of Systems  Positive: Diarrhea, nausea, vomiting, chest pain, shortness of breath Negative: Blood in vomit or stool  Physical Exam  BP (!) 138/91 (BP Location: Right Arm)    Pulse (!) 105    Temp 98.1 F (36.7 C) (Oral)    Ht 5\' 6"  (1.676 m)    Wt 63.5 kg    LMP 05/09/2014    SpO2 98%    BMI 22.60 kg/m  Gen:   Awake, no distress   Resp:  Normal effort  MSK:   Moves extremities without difficulty  Other:    Medical Decision Making  Medically screening exam initiated at 2:27 PM.  Appropriate orders placed.  Lindsey [name] was informed that the remainder of the evaluation will be completed by another provider, this initial triage assessment does not replace that evaluation, and the importance of remaining in the ED until their evaluation is complete.     Lindsey Cecil, PA-C 06/08/21 1428

## 2021-06-08 NOTE — Discharge Instructions (Addendum)
Please follow up with [facility] regarding your symptoms. Continue to maintain hydration as discussed. Return for inability to tolerate PO intake and concern for dehydration, worsening abdominal pain.

## 2021-06-09 LAB — GASTROINTESTINAL PANEL BY PCR, STOOL (REPLACES STOOL CULTURE)

## 2021-06-16 ENCOUNTER — Ambulatory Visit: Payer: BC Managed Care – PPO | Admitting: Neurology

## 2021-06-24 ENCOUNTER — Other Ambulatory Visit: Payer: BC Managed Care – PPO

## 2021-06-24 ENCOUNTER — Ambulatory Visit (INDEPENDENT_AMBULATORY_CARE_PROVIDER_SITE_OTHER): Payer: BC Managed Care – PPO | Admitting: Gastroenterology

## 2021-06-24 ENCOUNTER — Other Ambulatory Visit: Payer: Self-pay

## 2021-06-24 ENCOUNTER — Encounter: Payer: Self-pay | Admitting: Gastroenterology

## 2021-06-24 VITALS — BP 104/78 | HR 93 | Ht 66.0 in | Wt 144.2 lb

## 2021-06-24 DIAGNOSIS — R109 Unspecified abdominal pain: Secondary | ICD-10-CM | POA: Diagnosis not present

## 2021-06-24 DIAGNOSIS — R748 Abnormal levels of other serum enzymes: Secondary | ICD-10-CM

## 2021-06-24 DIAGNOSIS — K641 Second degree hemorrhoids: Secondary | ICD-10-CM

## 2021-06-24 DIAGNOSIS — R112 Nausea with vomiting, unspecified: Secondary | ICD-10-CM

## 2021-06-24 DIAGNOSIS — R1030 Lower abdominal pain, unspecified: Secondary | ICD-10-CM

## 2021-06-24 DIAGNOSIS — R197 Diarrhea, unspecified: Secondary | ICD-10-CM | POA: Diagnosis not present

## 2021-06-24 MED ORDER — DIPHENOXYLATE-ATROPINE 2.5-0.025 MG PO TABS: 1.0000 | ORAL_TABLET | Freq: Four times a day (QID) | ORAL | 3 refills | Status: DC | PRN

## 2021-06-24 NOTE — Patient Instructions (Addendum)
If you are age 54 or younger, your body mass index should be between 19-25. Your Body mass index is 23.28 kg/m?Marland Kitchen If this is out of the aformentioned range listed, please consider follow up with your Primary Care Provider.  ? ?__________________________________________________________ ? ?The [facility] providers would like to encourage you to use [facility] to communicate with providers for non-urgent requests or questions.  Due to long hold times on the telephone, sending your provider a message by [facility] may be a faster and more efficient way to get a response.  Please allow 48 business hours for a response.  Please remember that this is for non-urgent requests.  ? ?Due to recent changes in healthcare laws, you may see the results of your imaging and laboratory studies on MyChart before your provider has had a chance to review them.  We understand that in some cases there may be results that are confusing or concerning to you. Not all laboratory results come back in the same time frame and the provider may be waiting for multiple results in order to interpret others.  Please give [place] 48 hours in order for your provider to thoroughly review all the results before contacting the office for clarification of your results.  ? ?Please go to the lab on the 2nd floor [address] before you leave the office today.   ? ?We have sent the following medications to your pharmacy for you to pick up at your convenience: Lomotil ? ?You have been scheduled for an endoscopy. Please follow written instructions given to you at your visit today. ?If you use inhalers (even only as needed), please bring them with you on the day of your procedure.  ? ? ?Thank you for choosing me and [facility]. ? ?Gerrit Heck, D.O. ? ? ?

## 2021-06-24 NOTE — Progress Notes (Signed)
? ?Chief Complaint:    Diarrhea, lower abdominal pain ? ?GI History: 56 year old female with a history of fibromyalgia, carpal tunnel syndrome, arthritis, Ehlers-Danlos syndrome, IBS, asthma. ? ?IBS: Longstanding history of IBS-D dating back 20+ years.  Symptoms had been generally well controlled until acute onset abdominal cramping, diarrhea in 06/2020.  Did have right arm surgery on 07/08/2020 and took vancomycin perioperatively.  Has been having ongoing, intermittent watery, nonbloody stools since then. ?- 07/11/2020: ER evaluation: Normal CBC, CMP.  CT with mild wall thickening throughout the colon mostly distal to hepatic flexure, 4.5 cm uterine fibroid.  Discharged with Cipro/Flagyl ?- 07/20/2020: GI evaluation.  Negative GI PCR panel.  Trialed course of Bentyl and Imodium ?- 10/27/2020: Colonoscopy: Normal colon (biopsies negative for MC, IBD), grade 1 internal hemorrhoids.  Normal TI.  Repeat 10 years ?- 11/09/2020: GI follow-up.  Trialed course of FD guard for abdominal cramping/dyspepsia.  Diarrhea resolved at that time. ?  ?Follows at [facility] and takes several vitamins/supplements. ?  ?History of symptomatic hemorrhoids. ?- 12/04/2020: Banding of RA hemorrhoid ?- 01/05/2021: Banding of LL hemorrhoid ?- 03/02/2021: Banding of RP hemorrhoid ? ?HPI:   ? ? ?Patient is a 56 y.o. female presenting to the Gastroenterology Clinic for follow-up.  Last seen by me on 03/02/2021 for completion of hemorrhoid banding series as outlined above. ? ?Since then, her diarrheal symptoms have been worsening.  Describes watery, nonbloody diarrhea and lower abdominal pain.  Occasional nausea and nonbloody emesis with these episodes.  Has been seen in the ER for the symptoms x2 over the last month or so. ?- 05/24/2021: ER evaluation for diarrhea.  Reportedly ongoing symptoms despite 24 (2 mg) tablets of Imodium in 24 hours.  Improved with IV fluids and discharged home with Rx for Bentyl, Levsin ?- 06/08/2021: ER evaluation for  diarrhea and abdominal pain.  HD stable, benign exam.  Lipase 114, otherwise normal CMP, CBC.  C. difficile and GI PCR panel negative.  CT with thickening of entire colon concerning for colitis.  Normal pancreas.  Treated with IVF, Bentyl, IV droperidol, GI cocktail with improvement and discharged home with Phenergan. ? ?Was seen in the Allergy Clinic in 02/2018 for allergic contact dermatitis.  Was treated with aeroallergen immunotherapy 12/2017.  She reports a history of milk and egg allergy. ? ?She is also concerned about infectious exposure as she frequently visits her mother in [place] who [place]. ? ?Separately, through [facility] she recently sent off a $400 stool test to a veterinarian in [place] to evaluate for various infectious etiologies for her ongoing diarrhea.  Test takes several weeks to turn around.  She is unsure of what specifics are of testing. ? ? ?-05/31/2021: CT A/P in ER: Possible GB sludge and/or stones, slight prominence of pancreatic duct without focal abnormality.  Mild diffuse wall thickening of the colon without pericolic stranding or fluid collection, suggesting possible inflammatory or infectious colitis.  Hepatic cysts or hemangiomas, unchanged in size. ? ? ? ?Review of systems:     No chest pain, no SOB, no fevers, no urinary sx  ? ?Past Medical History:  ?Diagnosis Date  ? Allergy   ? Angio-edema   ? Arthritis   ? Asthma   ? Carpal tunnel syndrome 07/07/2015  ? right  ? Chronic cough 12/02/2015  ? Chronic pain syndrome 08/10/2016  ? Ehlers-Danlos syndrome   ? Essential alopecia of women 04/14/2011  ? Family history of breast cancer in female 08/26/2015  ? (  x2) paternal 1st cousins dx in their 26s   ? Fibrocystic breast changes of both breasts 07/19/2015  ? Fibromyalgia   ? HLD (hyperlipidemia)   ? Hypermobility syndrome 11/26/2014  ? Based on Fam Hx and Beighton Score of 7 this is likely ED III   ? Hypertrophy of vulva 07/20/2017  ? Inflammatory dermatosis  04/14/2011  ? Irritable larynx syndrome 12/02/2015  ? Lumbar radiculopathy 02/16/2016  ? Osteoarthritis 04/01/2017  ? Osteopenia   ? Primary arthrosis of first carpometacarpal joints, bilateral 06/01/2015  ? Sacroiliac joint dysfunction of right side 11/26/2014  ? B SI joint xrays last done at [facility] Dr. Kathlene November 03/13/2017  ? Skin cancer   ? ? ?Patient's surgical history, family medical history, social history, medications and allergies were all reviewed in Epic  ? ? ?Current Outpatient Medications  ?Medication Sig Dispense Refill  ? Acetaminophen (TYLENOL 8 HOUR ARTHRITIS PAIN PO) Take 1,300 mg by mouth in the morning and at bedtime.    ? Albuterol Sulfate (PROAIR RESPICLICK) 333 (90 Base) MCG/ACT AEPB Inhale into the lungs as needed.    ? Ascorbic Acid (VITAMIN C PO) Take 1,200 mg by mouth 2 (two) times daily.     ? CHITOSAN PO Take 1,000 mg by mouth in the morning and at bedtime.    ? Cholecalciferol (VITAMIN D3) 1.25 MG (50000 UT) TABS Take 1 tablet by mouth once a week.    ? clonazePAM (KLONOPIN) 0.5 MG tablet Take 1 tablet (0.5 mg total) by mouth at bedtime. 30 tablet 1  ? cycloSPORINE (RESTASIS) 0.05 % ophthalmic emulsion Place 1 drop into both eyes 2 (two) times daily.    ? dicyclomine (BENTYL) 20 MG tablet Take 1 tablet (20 mg total) by mouth 2 (two) times daily. 30 tablet 0  ? estradiol (VIVELLE-DOT) 0.075 MG/24HR Place onto the skin every 3 (three) days.    ? FERROUS BISGLYCINATE CHELATE PO Take 18 mg by mouth daily.    ? fexofenadine (ALLEGRA) 180 MG tablet Take 180 mg by mouth daily.    ? folic acid (FOLVITE) 545 MCG tablet Take 800 mcg by mouth daily.    ? gabapentin (NEURONTIN) 300 MG capsule Take 300 mg by mouth at bedtime.    ? Hyoscyamine Sulfate SL (LEVSIN/SL) 0.125 MG SUBL Place 0.125 mg under the tongue every 6 (six) hours as needed. 30 tablet 0  ? ipratropium (ATROVENT) 0.06 % nasal spray Place 2 sprays into both nostrils 2 (two) times daily as needed.    ? LEVOCETIRIZINE DIHYDROCHLORIDE PO  Take 5 mg by mouth at bedtime.    ? MAGNESIUM MALATE PO Take 450 mg by mouth in the morning and at bedtime.    ? MELATONIN PO Take 30 mg by mouth at bedtime.    ? Menaquinone-7 (VITAMIN K2 PO) Take 150 mcg by mouth every morning.    ? montelukast (SINGULAIR) 10 MG tablet Take 1 tablet by mouth at bedtime.    ? mupirocin ointment (BACTROBAN) 2 % Apply 1 application topically 3 (three) times daily as needed.    ? NALTREXONE HCL PO Take 6 mg by mouth at bedtime.    ? OVER THE COUNTER MEDICATION 306 mg every morning. Estrogen control    ? OVER THE COUNTER MEDICATION 2 capsules every morning. MTHFR Endure (RIH)    ? PHOSPHATIDYLSERINE PO 200 mg every morning.    ? Pregnenolone Micronized (PREGNENOLONE PO) Take 75 mg by mouth 3 (three) times a week.    ? progesterone (  PROMETRIUM) 200 MG capsule progesterone micronized 200 mg capsule ? TAKE 3 CAPSULES EVERY NIGHT    ? promethazine (PHENERGAN) 25 MG tablet Take 1 tablet (25 mg total) by mouth every 6 (six) hours as needed for nausea or vomiting. 30 tablet 0  ? sertraline (ZOLOFT) 25 MG tablet TAKE 1 TABLET BY MOUTH EVERY DAY 90 tablet 2  ? Testosterone 75 MG PLLT 75 mg every 3 (three) months. Insert 1 pellet every 3 months    ? traZODone (DESYREL) 100 MG tablet trazodone 100 mg tablet ? TAKE 1-2 TABLET BY MOUTH AT BEDTIME    ? ?No current facility-administered medications for this visit.  ? ? ?Physical Exam:   ? ? ?Ht '5\' 6"'$  (1.676 m)   Wt 144 lb 4 oz (65.4 kg)   LMP 05/09/2014   BMI 23.28 kg/m?  ? ?GENERAL:  Pleasant female in NAD ?PSYCH: : Cooperative, normal affect ?NEURO: Alert and oriented x 3, no focal neurologic deficits ? ? ?IMPRESSION and PLAN:   ? ?1) Chronic diarrhea ?2) IBS-D ?3) Abdominal cramping/lower abdominal pain ?56 year old female with long history of IBS-D, which was largely quiescent, but over the last year or so has been having watery, nonbloody stools and lower abdominal cramping.  Episodes can last up to 8 weeks at a time.  Not much response to  Imodium.  Work-up to date has been largely unrevealing to include colonoscopy with biopsies, several GI PCR panel is negative for infection, normal CBC, CMP.  Interestingly head CT in 05/2021 and 07/2020 with mild

## 2021-06-28 ENCOUNTER — Other Ambulatory Visit (INDEPENDENT_AMBULATORY_CARE_PROVIDER_SITE_OTHER): Payer: BC Managed Care – PPO

## 2021-06-28 ENCOUNTER — Telehealth: Payer: Self-pay | Admitting: *Deleted

## 2021-06-28 DIAGNOSIS — R197 Diarrhea, unspecified: Secondary | ICD-10-CM

## 2021-06-28 DIAGNOSIS — R112 Nausea with vomiting, unspecified: Secondary | ICD-10-CM | POA: Diagnosis not present

## 2021-06-28 LAB — C-REACTIVE PROTEIN: CRP: <1 mg/dL (ref 0.5–20.0)

## 2021-06-28 LAB — LIPASE: Lipase: 49 U/L (ref 11.0–59.0)

## 2021-06-28 LAB — SEDIMENTATION RATE: Sed Rate: 30 mm/hr (ref 0–30)

## 2021-06-28 NOTE — Progress Notes (Signed)
Total volume:  2567m ?Start:  06/27/21 7am ?End:  06/28/21  7am ?

## 2021-06-28 NOTE — Telephone Encounter (Signed)
Pt dropped off 24 hour urine this morning.  The start date / time and end date / time were not written on the collection jug.  Left voice message for pt to submit this information to [place] via mychart message. Specimen will be held until this information is provided.  ?

## 2021-06-29 LAB — GIARDIA ANTIGEN
MICRO NUMBER:: 13152150
RESULT:: NOT DETECTED
SPECIMEN QUALITY:: ADEQUATE

## 2021-06-29 NOTE — Telephone Encounter (Signed)
Pt never responded to my voicemail or [employer]. Specimen had to be sent to lab so default start date of 06/27/21 7am and end date of 06/28/21 7am was submitted with specimen. ?

## 2021-07-01 ENCOUNTER — Ambulatory Visit (AMBULATORY_SURGERY_CENTER): Payer: BC Managed Care – PPO | Admitting: Gastroenterology

## 2021-07-01 ENCOUNTER — Encounter: Payer: Self-pay | Admitting: Gastroenterology

## 2021-07-01 VITALS — BP 125/80 | HR 67 | Temp 98.6°F | Resp 16 | Ht 66.0 in | Wt 144.0 lb

## 2021-07-01 DIAGNOSIS — K921 Melena: Secondary | ICD-10-CM | POA: Diagnosis not present

## 2021-07-01 DIAGNOSIS — R52 Pain, unspecified: Secondary | ICD-10-CM | POA: Diagnosis not present

## 2021-07-01 DIAGNOSIS — K449 Diaphragmatic hernia without obstruction or gangrene: Secondary | ICD-10-CM | POA: Diagnosis not present

## 2021-07-01 DIAGNOSIS — K222 Esophageal obstruction: Secondary | ICD-10-CM

## 2021-07-01 DIAGNOSIS — M797 Fibromyalgia: Secondary | ICD-10-CM | POA: Diagnosis not present

## 2021-07-01 DIAGNOSIS — R197 Diarrhea, unspecified: Secondary | ICD-10-CM

## 2021-07-01 DIAGNOSIS — R112 Nausea with vomiting, unspecified: Secondary | ICD-10-CM

## 2021-07-01 DIAGNOSIS — R109 Unspecified abdominal pain: Secondary | ICD-10-CM | POA: Diagnosis not present

## 2021-07-01 MED ORDER — SODIUM CHLORIDE 0.9 % IV SOLN: 500.0000 mL | Freq: Once | INTRAVENOUS | Status: DC

## 2021-07-01 NOTE — Progress Notes (Signed)
Called to room to assist during endoscopic procedure.  Patient ID and intended procedure confirmed with present staff. Received instructions for my participation in the procedure from the performing physician.  

## 2021-07-01 NOTE — Op Note (Signed)
[place] ?Patient Name: Lindsey [name] ?Procedure Date: 07/01/2021 3:16 PM ?MRN: [id number] ?Endoscopist: Gerrit Heck , MD ?Age: 56 ?Referring MD:  ?Date of Birth: 07/01/1965 ?Gender: Female ?Account #: [id number] ?Procedure:                Upper GI endoscopy ?Indications:              Diarrhea, Nausea with vomiting, Elevated lipse (now  ?                          normal), Abdominal cramping ?Medicines:                Monitored Anesthesia Care ?Procedure:                Pre-Anesthesia Assessment: ?                          - Prior to the procedure, a History and Physical  ?                          was performed, and patient medications and  ?                          allergies were reviewed. The patient's tolerance of  ?                          previous anesthesia was also reviewed. The risks  ?                          and benefits of the procedure and the sedation  ?                          options and risks were discussed with the patient.  ?                          All questions were answered, and informed consent  ?                          was obtained. Prior Anticoagulants: The patient has  ?                          taken no previous anticoagulant or antiplatelet  ?                          agents. ASA Grade Assessment: II - A patient with  ?                          mild systemic disease. After reviewing the risks  ?                          and benefits, the patient was deemed in  ?                          satisfactory condition to undergo the procedure. ?  After obtaining informed consent, the endoscope was  ?                          passed under direct vision. Throughout the  ?                          procedure, the patient's blood pressure, pulse, and  ?                          oxygen saturations were monitored continuously. The  ?                          GIF [id number] #[id number] was introduced through the  ?                          mouth, and advanced to the  second part of duodenum.  ?                          The upper GI endoscopy was accomplished without  ?                          difficulty. The patient tolerated the procedure  ?                          well. ?Scope In: ?Scope Out: ?Findings:                 A 2 cm sliding type hiatal hernia was present, with  ?                          Hill Grade 3 valve noted on retroflexion. ?                          A non-obstructing Schatzki ring was found in the  ?                          lower third of the esophagus. During the procedure,  ?                          the patient coughed and there was a small mucosal  ?                          rent at the ring, akin to endoscopic dilation.  ?                          There was mild persistent oozing. For hemostasis,  ?                          one hemostatic clip was successfully placed (MR  ?                          conditional). There was no bleeding at the end of  ?  the procedure. ?                          The upper third of the esophagus, middle third of  ?                          the esophagus and gastroesophageal junction were  ?                          normal. ?                          The entire examined stomach was normal. Biopsies  ?                          were taken with a cold forceps for histology.  ?                          Estimated blood loss was minimal. ?                          The examined duodenum was normal. Biopsies were  ?                          taken with a cold forceps for histology. Estimated  ?                          blood loss was minimal. ?Complications:            No immediate complications. ?Estimated Blood Loss:     Estimated blood loss was minimal. ?Impression:               - 2 cm sliding type hiatal hernia. ?                          - Non-obstructing Schatzki ring. Following a single  ?                          cough, there was a mucosal rent noted at the ring,  ?                          similar to  standard endoscopic dilation. However,  ?                          there was mild persistent oozing from the site,  ?                          which was successfully treated with one hemostatic  ?                          clip (MR conditional), with completed cessation of  ?                          bleeding. Further dilation of the ring was not  ?  indicated based on endoscopic appearance. ?                          - Normal upper third of esophagus, middle third of  ?                          esophagus and gastroesophageal junction. ?                          - Normal stomach. Biopsied. ?                          - Normal examined duodenum. Biopsied. ?Recommendation:           - Patient has a contact number available for  ?                          emergencies. The signs and symptoms of potential  ?                          delayed complications were discussed with the  ?                          patient. Return to normal activities tomorrow.  ?                          Written discharge instructions were provided to the  ?                          patient. ?                          - Soft diet today then advance as tolerated  ?                          tomorrow per post dilation protocol. ?                          - Continue present medications. ?                          - Await pathology results. ?                          - Will follow-up on the multiple pending labs for  ?                          extended evaluation of persistent symptoms. ?Gerrit Heck, MD ?07/01/2021 3:52:37 PM ?

## 2021-07-01 NOTE — Progress Notes (Signed)
? ?GASTROENTEROLOGY PROCEDURE H&P NOTE  ? ?Primary Care Physician: ?[place], [place] @ [facility] ? ? ? ?Reason for Procedure:   Diarrhea, abdominal cramping, change in stools, elevated lipase, nausea/vomiting ? ?Plan:    EGD ? ?Patient is appropriate for endoscopic procedure(s) in the ambulatory ([facility]) setting. ? ?The nature of the procedure, as well as the risks, benefits, and alternatives were carefully and thoroughly reviewed with the patient. Ample time for discussion and questions allowed. The patient understood, was satisfied, and agreed to proceed.  ? ? ? ?HPI: ?Lindsey [name] is a 56 y.o. female who presents for EGD for evaluation of diarrhea, nausea/vomiting, abdominal cramping, change in stools, elevated lipase.  Patient was most recently seen in the Gastroenterology Clinic on 06/24/2021 by me.  No interval change in medical history since that appointment. Please refer to that note for full details regarding GI history and clinical presentation.  ? ?Past Medical History:  ?Diagnosis Date  ? Allergy   ? Angio-edema   ? Arthritis   ? Asthma   ? Carpal tunnel syndrome 07/07/2015  ? right  ? Chronic cough 12/02/2015  ? Chronic pain syndrome 08/10/2016  ? Ehlers-Danlos syndrome   ? Essential alopecia of women 04/14/2011  ? Family history of breast cancer in female 08/26/2015  ? (x2) paternal 1st cousins dx in their 43s   ? Fibrocystic breast changes of both breasts 07/19/2015  ? Fibromyalgia   ? HLD (hyperlipidemia)   ? Hypermobility syndrome 11/26/2014  ? Based on Fam Hx and Beighton Score of 7 this is likely ED III   ? Hypertrophy of vulva 07/20/2017  ? Inflammatory dermatosis 04/14/2011  ? Irritable larynx syndrome 12/02/2015  ? Lumbar radiculopathy 02/16/2016  ? Osteoarthritis 04/01/2017  ? Osteopenia   ? Primary arthrosis of first carpometacarpal joints, bilateral 06/01/2015  ? Sacroiliac joint dysfunction of right side 11/26/2014  ? B SI joint xrays last done at [facility] Dr. Kathlene November 03/13/2017  ? Skin  cancer   ? ? ?Past Surgical History:  ?Procedure Laterality Date  ? BREAST EXCISIONAL BIOPSY Left 01/2019  ? atypical lobular hyperplasia  ? CARPAL TUNNEL RELEASE Right 06/2020  ? COLONOSCOPY    ? DENTAL SURGERY    ? ELBOW FRACTURE SURGERY Left   ? as child  ? RADIOACTIVE SEED GUIDED EXCISIONAL BREAST BIOPSY Left 02/05/2019  ? Procedure: RADIOACTIVE SEED GUIDED EXCISIONAL LEFT BREAST BIOPSY;  Surgeon: Rolm Bookbinder, MD;  Location: [facility];  Service: General;  Laterality: Left;  ? ? ?Prior to Admission medications   ?Medication Sig Start Date End Date Taking? Authorizing Provider  ?Acetaminophen (TYLENOL 8 HOUR ARTHRITIS PAIN PO) Take 1,300 mg by mouth in the morning and at bedtime.   Yes [provider]  ?Albuterol Sulfate (PROAIR RESPICLICK) 174 (90 Base) MCG/ACT AEPB Inhale into the lungs as needed.   Yes [provider]  ?CHITOSAN PO Take 1,000 mg by mouth in the morning and at bedtime.   Yes [provider]  ?Cholecalciferol (VITAMIN D3) 1.25 MG (50000 UT) TABS Take 1 tablet by mouth once a week.   Yes [provider]  ?clonazePAM (KLONOPIN) 0.5 MG tablet Take 1 tablet (0.5 mg total) by mouth at bedtime. 03/15/21  Yes Dohmeier, Asencion Partridge, MD  ?cycloSPORINE (RESTASIS) 0.05 % ophthalmic emulsion Place 1 drop into both eyes 2 (two) times daily.   Yes [provider]  ?FERROUS BISGLYCINATE CHELATE PO Take 18 mg by mouth daily.   Yes [provider]  ?  folic acid (FOLVITE) 818 MCG tablet Take 800 mcg by mouth daily.   Yes [provider]  ?gabapentin (NEURONTIN) 300 MG capsule Take 300 mg by mouth at bedtime. 04/17/14  Yes [provider]  ?Hyoscyamine Sulfate SL (LEVSIN/SL) 0.125 MG SUBL Place 0.125 mg under the tongue every 6 (six) hours as needed. 05/24/21  Yes Valarie Merino, MD  ?LEVOCETIRIZINE DIHYDROCHLORIDE PO Take 5 mg by mouth at bedtime.   Yes [provider]  ?MAGNESIUM MALATE PO Take 450 mg by mouth in the  morning and at bedtime.   Yes [provider]  ?MELATONIN PO Take 30 mg by mouth at bedtime.   Yes [provider]  ?Menaquinone-7 (VITAMIN K2 PO) Take 150 mcg by mouth every morning.   Yes [provider]  ?montelukast (SINGULAIR) 10 MG tablet Take 1 tablet by mouth at bedtime. 11/02/17  Yes [provider]  ?NALTREXONE HCL PO Take 6 mg by mouth at bedtime.   Yes [provider]  ?OVER THE COUNTER MEDICATION 306 mg every morning. Estrogen control   Yes [provider]  ?OVER THE COUNTER MEDICATION 2 capsules every morning. MTHFR Endure (RIH)   Yes [provider]  ?PHOSPHATIDYLSERINE PO 200 mg every morning.   Yes [provider]  ?Pregnenolone Micronized (PREGNENOLONE PO) Take 75 mg by mouth 3 (three) times a week.   Yes [provider]  ?progesterone (PROMETRIUM) 200 MG capsule progesterone micronized 200 mg capsule ? TAKE 3 CAPSULES EVERY NIGHT   Yes [provider]  ?sertraline (ZOLOFT) 25 MG tablet TAKE 1 TABLET BY MOUTH EVERY DAY 11/02/20  Yes Dohmeier, Asencion Partridge, MD  ?traZODone (DESYREL) 100 MG tablet trazodone 100 mg tablet ? TAKE 1-2 TABLET BY MOUTH AT BEDTIME   Yes [provider]  ?Ascorbic Acid (VITAMIN C PO) Take 1,200 mg by mouth 2 (two) times daily.     [provider]  ?dicyclomine (BENTYL) 20 MG tablet Take 1 tablet (20 mg total) by mouth 2 (two) times daily. ?Patient not taking: Reported on 07/01/2021 05/24/21   Valarie Merino, MD  ?diphenoxylate-atropine (LOMOTIL) 2.5-0.025 MG tablet Take 1 tablet by mouth 4 (four) times daily as needed for diarrhea or loose stools. 06/24/21   Daylon Lafavor V, DO  ?estradiol (VIVELLE-DOT) 0.075 MG/24HR Place onto the skin every 3 (three) days. 12/20/20   [provider]  ?fexofenadine (ALLEGRA) 180 MG tablet Take 180 mg by mouth daily.    [provider]  ?ipratropium (ATROVENT) 0.06 % nasal spray Place 2 sprays into both nostrils 2 (two)  times daily as needed.    [provider]  ?mupirocin ointment (BACTROBAN) 2 % Apply 1 application topically 3 (three) times daily as needed.    [provider]  ?promethazine (PHENERGAN) 25 MG tablet Take 1 tablet (25 mg total) by mouth every 6 (six) hours as needed for nausea or vomiting. 06/08/21   Regan Lemming, MD  ?Testosterone 75 MG PLLT 75 mg every 3 (three) months. Insert 1 pellet every 3 months    [provider]  ? ? ?Current Outpatient Medications  ?Medication Sig Dispense Refill  ? Acetaminophen (TYLENOL 8 HOUR ARTHRITIS PAIN PO) Take 1,300 mg by mouth in the morning and at bedtime.    ? Albuterol Sulfate (PROAIR RESPICLICK) 299 (90 Base) MCG/ACT AEPB Inhale into the lungs as needed.    ? CHITOSAN PO Take 1,000 mg by mouth in the morning and at bedtime.    ? Cholecalciferol (  VITAMIN D3) 1.25 MG (50000 UT) TABS Take 1 tablet by mouth once a week.    ? clonazePAM (KLONOPIN) 0.5 MG tablet Take 1 tablet (0.5 mg total) by mouth at bedtime. 30 tablet 1  ? cycloSPORINE (RESTASIS) 0.05 % ophthalmic emulsion Place 1 drop into both eyes 2 (two) times daily.    ? FERROUS BISGLYCINATE CHELATE PO Take 18 mg by mouth daily.    ? folic acid (FOLVITE) 902 MCG tablet Take 800 mcg by mouth daily.    ? gabapentin (NEURONTIN) 300 MG capsule Take 300 mg by mouth at bedtime.    ? Hyoscyamine Sulfate SL (LEVSIN/SL) 0.125 MG SUBL Place 0.125 mg under the tongue every 6 (six) hours as needed. 30 tablet 0  ? LEVOCETIRIZINE DIHYDROCHLORIDE PO Take 5 mg by mouth at bedtime.    ? MAGNESIUM MALATE PO Take 450 mg by mouth in the morning and at bedtime.    ? MELATONIN PO Take 30 mg by mouth at bedtime.    ? Menaquinone-7 (VITAMIN K2 PO) Take 150 mcg by mouth every morning.    ? montelukast (SINGULAIR) 10 MG tablet Take 1 tablet by mouth at bedtime.    ? NALTREXONE HCL PO Take 6 mg by mouth at bedtime.    ? OVER THE COUNTER MEDICATION 306 mg every morning. Estrogen control    ? OVER THE COUNTER MEDICATION 2  capsules every morning. MTHFR Endure (RIH)    ? PHOSPHATIDYLSERINE PO 200 mg every morning.    ? Pregnenolone Micronized (PREGNENOLONE PO) Take 75 mg by mouth 3 (three) times a week.    ? progesterone (PROM

## 2021-07-01 NOTE — Progress Notes (Signed)
Pt non-responsive, VVS, Report to RN  °

## 2021-07-01 NOTE — Progress Notes (Signed)
VS completed by DT.    Medical history reviewed and updated.  

## 2021-07-01 NOTE — Patient Instructions (Signed)
YOU HAD AN ENDOSCOPIC PROCEDURE TODAY AT [facility]:   Refer to the procedure report that was given to you for any specific questions about what was found during the examination.  If the procedure report does not answer your questions, please call your gastroenterologist to clarify.  If you requested that your care partner not be given the details of your procedure findings, then the procedure report has been included in a sealed envelope for you to review at your convenience later. ? ?**Clip card given** ? ?YOU SHOULD EXPECT: Some feelings of bloating in the abdomen. Passage of more gas than usual.  Walking can help get rid of the air that was put into your GI tract during the procedure and reduce the bloating. If you had a lower endoscopy (such as a colonoscopy or flexible sigmoidoscopy) you may notice spotting of blood in your stool or on the toilet paper. If you underwent a bowel prep for your procedure, you may not have a normal bowel movement for a few days. ? ?Please Note:  You might notice some irritation and congestion in your nose or some drainage.  This is from the oxygen used during your procedure.  There is no need for concern and it should clear up in a day or so. ? ?SYMPTOMS TO REPORT IMMEDIATELY: ? ?Following upper endoscopy (EGD) ? Vomiting of blood or coffee ground material ? New chest pain or pain under the shoulder blades ? Painful or persistently difficult swallowing ? New shortness of breath ? Fever of 100?F or higher ? Black, tarry-looking stools ? ?For urgent or emergent issues, a gastroenterologist can be reached at any hour by calling [phone]. ?Do not use MyChart messaging for urgent concerns.  ? ? ?DIET:  We do recommend a small meal at first, but then you may proceed to your regular diet.  Drink plenty of fluids but you should avoid alcoholic beverages for 24 hours. ? ?ACTIVITY:  You should plan to take it easy for the rest of today and you should NOT DRIVE or use  heavy machinery until tomorrow (because of the sedation medicines used during the test).   ? ?FOLLOW UP: ?Our staff will call the number listed on your records 48-72 hours following your procedure to check on you and address any questions or concerns that you may have regarding the information given to you following your procedure. If we do not reach you, we will leave a message.  We will attempt to reach you two times.  During this call, we will ask if you have developed any symptoms of COVID 19. If you develop any symptoms (ie: fever, flu-like symptoms, shortness of breath, cough etc.) before then, please call [phone].  If you test positive for Covid 19 in the 2 weeks post procedure, please call and report this information to [place].   ? ?If any biopsies were taken you will be contacted by phone or by letter within the next 1-3 weeks.  Please call us at [phone] if you have not heard about the biopsies in 3 weeks.  ? ? ?SIGNATURES/CONFIDENTIALITY: ?You and/or your care partner have signed paperwork which will be entered into your electronic medical record.  These signatures attest to the fact that that the information above on your After Visit Summary has been reviewed and is understood.  Full responsibility of the confidentiality of this discharge information lies with you and/or your care-partner.  ?

## 2021-07-04 ENCOUNTER — Emergency Department (HOSPITAL_COMMUNITY)
Admission: EM | Admit: 2021-07-04 | Discharge: 2021-07-05 | Disposition: A | Discharge status: Home / Self Care | Payer: BC Managed Care – PPO | Attending: Emergency Medicine | Admitting: Emergency Medicine

## 2021-07-04 ENCOUNTER — Encounter (HOSPITAL_COMMUNITY): Payer: Self-pay

## 2021-07-04 DIAGNOSIS — R197 Diarrhea, unspecified: Secondary | ICD-10-CM | POA: Insufficient documentation

## 2021-07-04 DIAGNOSIS — R1084 Generalized abdominal pain: Secondary | ICD-10-CM | POA: Insufficient documentation

## 2021-07-04 LAB — 5 HIAA, QUANTITATIVE, URINE, 24 HOUR
5 HIAA, 24 Hour Urine: 2.3 mg/24 h (ref <=6.0)
Total Volume: 2500 mL

## 2021-07-04 NOTE — ED Triage Notes (Signed)
Patient arrived with complaints of diarrhea over the last year. States she was told it may be gallstones or UC. Tonight reporting generalized abdominal pain. ?

## 2021-07-05 ENCOUNTER — Telehealth: Payer: Self-pay

## 2021-07-05 LAB — LIPASE, BLOOD: Lipase: 54 U/L — ABNORMAL HIGH (ref 11–51)

## 2021-07-05 LAB — URINALYSIS, ROUTINE W REFLEX MICROSCOPIC
Bilirubin Urine: NEGATIVE
Glucose, UA: NEGATIVE mg/dL
Hgb urine dipstick: NEGATIVE
Ketones, ur: NEGATIVE mg/dL
Leukocytes,Ua: NEGATIVE
Nitrite: NEGATIVE
Protein, ur: NEGATIVE mg/dL
Specific Gravity, Urine: 1.02 (ref 1.005–1.030)
pH: 6 (ref 5.0–8.0)

## 2021-07-05 LAB — CBC
HCT: 41.3 % (ref 36.0–46.0)
Hemoglobin: 13.9 g/dL (ref 12.0–15.0)
MCH: 31.1 pg (ref 26.0–34.0)
MCHC: 33.7 g/dL (ref 30.0–36.0)
MCV: 92.4 fL (ref 80.0–100.0)
Platelets: 288 10*3/uL (ref 150–400)
RBC: 4.47 MIL/uL (ref 3.87–5.11)
RDW: 12.7 % (ref 11.5–15.5)
WBC: 5.6 10*3/uL (ref 4.0–10.5)
nRBC: 0 % (ref 0.0–0.2)

## 2021-07-05 LAB — COMPREHENSIVE METABOLIC PANEL
ALT: 17 U/L (ref 0–44)
AST: 23 U/L (ref 15–41)
Albumin: 4 g/dL (ref 3.5–5.0)
Alkaline Phosphatase: 42 U/L (ref 38–126)
Anion gap: 8 (ref 5–15)
BUN: 16 mg/dL (ref 6–20)
CO2: 20 mmol/L — ABNORMAL LOW (ref 22–32)
Calcium: 8.7 mg/dL — ABNORMAL LOW (ref 8.9–10.3)
Chloride: 108 mmol/L (ref 98–111)
Creatinine, Ser: 0.89 mg/dL (ref 0.44–1.00)
GFR, Estimated: >60 mL/min (ref >60)
Glucose, Bld: 98 mg/dL (ref 70–99)
Potassium: 3.6 mmol/L (ref 3.5–5.1)
Sodium: 136 mmol/L (ref 135–145)
Total Bilirubin: 0.5 mg/dL (ref 0.3–1.2)
Total Protein: 7.2 g/dL (ref 6.5–8.1)

## 2021-07-05 LAB — I-STAT BETA HCG BLOOD, ED (MC, WL, AP ONLY): I-stat hCG, quantitative: <5 m[IU]/mL (ref <5)

## 2021-07-05 LAB — PANCREATIC ELASTASE, FECAL: Pancreatic Elastase-1, Stool: >500 mcg/g

## 2021-07-05 NOTE — ED Provider Notes (Signed)
?[facility] ?Provider Note ? ? ?CSN: [id number] ?Arrival date & time: 07/04/21  2332 ? ?  ? ?History ? ?Chief Complaint  ?Patient presents with  ? Abdominal Pain  ? ? ?Lindsey [name] is a 56 y.o. female here for evaluation of diarrhea and abdominal pain over the last year.  States she has had this problem for many years.  She feels like no one can figure out what is wrong with her.  She is followed by Dr. Bryan Lemma with GI. Had recent scope. No fever, emesis, CP, SOB, dysuria, hematuria. Her pain today is unchanged from her baseline. ? ?HPI ? ?  ? ?Home Medications ?Prior to Admission medications   ?Medication Sig Start Date End Date Taking? Authorizing Provider  ?Acetaminophen (TYLENOL 8 HOUR ARTHRITIS PAIN PO) Take 1,300 mg by mouth in the morning and at bedtime.    [provider]  ?Albuterol Sulfate (PROAIR RESPICLICK) 409 (90 Base) MCG/ACT AEPB Inhale into the lungs as needed.    [provider]  ?Ascorbic Acid (VITAMIN C PO) Take 1,200 mg by mouth 2 (two) times daily.     [provider]  ?CHITOSAN PO Take 1,000 mg by mouth in the morning and at bedtime.    [provider]  ?Cholecalciferol (VITAMIN D3) 1.25 MG (50000 UT) TABS Take 1 tablet by mouth once a week.    [provider]  ?clonazePAM (KLONOPIN) 0.5 MG tablet Take 1 tablet (0.5 mg total) by mouth at bedtime. 03/15/21   Dohmeier, Asencion Partridge, MD  ?cycloSPORINE (RESTASIS) 0.05 % ophthalmic emulsion Place 1 drop into both eyes 2 (two) times daily.    [provider]  ?dicyclomine (BENTYL) 20 MG tablet Take 1 tablet (20 mg total) by mouth 2 (two) times daily. ?Patient not taking: Reported on 07/01/2021 05/24/21   Valarie Merino, MD  ?diphenoxylate-atropine (LOMOTIL) 2.5-0.025 MG tablet Take 1 tablet by mouth 4 (four) times daily as needed for diarrhea or loose stools. 06/24/21   Cirigliano, Vito V, DO  ?estradiol (VIVELLE-DOT) 0.075 MG/24HR Place onto the skin every 3  (three) days. 12/20/20   [provider]  ?FERROUS BISGLYCINATE CHELATE PO Take 18 mg by mouth daily.    [provider]  ?fexofenadine (ALLEGRA) 180 MG tablet Take 180 mg by mouth daily.    [provider]  ?folic acid (FOLVITE) 811 MCG tablet Take 800 mcg by mouth daily.    [provider]  ?gabapentin (NEURONTIN) 300 MG capsule Take 300 mg by mouth at bedtime. 04/17/14   [provider]  ?Hyoscyamine Sulfate SL (LEVSIN/SL) 0.125 MG SUBL Place 0.125 mg under the tongue every 6 (six) hours as needed. 05/24/21   Valarie Merino, MD  ?ipratropium (ATROVENT) 0.06 % nasal spray Place 2 sprays into both nostrils 2 (two) times daily as needed.    [provider]  ?LEVOCETIRIZINE DIHYDROCHLORIDE PO Take 5 mg by mouth at bedtime.    [provider]  ?MAGNESIUM MALATE PO Take 450 mg by mouth in the morning and at bedtime.    [provider]  ?MELATONIN PO Take 30 mg by mouth at bedtime.    [provider]  ?Menaquinone-7 (VITAMIN K2 PO) Take 150 mcg by mouth every morning.    [provider]  ?montelukast (SINGULAIR) 10 MG tablet Take 1 tablet by mouth at bedtime. 11/02/17   [provider]  ?mupirocin ointment (BACTROBAN) 2 % Apply 1 application topically 3 (three) times daily as needed.  [provider]  ?NALTREXONE HCL PO Take 6 mg by mouth at bedtime.    [provider]  ?OVER THE COUNTER MEDICATION 306 mg every morning. Estrogen control    [provider]  ?OVER THE COUNTER MEDICATION 2 capsules every morning. MTHFR Endure (RIH)    [provider]  ?PHOSPHATIDYLSERINE PO 200 mg every morning.    [provider]  ?Pregnenolone Micronized (PREGNENOLONE PO) Take 75 mg by mouth 3 (three) times a week.    [provider]  ?progesterone (PROMETRIUM) 200 MG capsule progesterone micronized 200 mg capsule ? TAKE 3 CAPSULES EVERY NIGHT    [provider]  ?promethazine  (PHENERGAN) 25 MG tablet Take 1 tablet (25 mg total) by mouth every 6 (six) hours as needed for nausea or vomiting. 06/08/21   Regan Lemming, MD  ?sertraline (ZOLOFT) 25 MG tablet TAKE 1 TABLET BY MOUTH EVERY DAY 11/02/20   Dohmeier, Asencion Partridge, MD  ?Testosterone 75 MG PLLT 75 mg every 3 (three) months. Insert 1 pellet every 3 months    [provider]  ?traZODone (DESYREL) 100 MG tablet trazodone 100 mg tablet ? TAKE 1-2 TABLET BY MOUTH AT BEDTIME    [provider]  ?   ? ?Allergies    ?Poison oak extract [poison oak extract], Xiidra [lifitegrast], Penicillins, Sulfa antibiotics, and Sulfamethoxazole-trimethoprim   ? ?Review of Systems   ?Review of Systems  ?Constitutional: Negative.   ?HENT: Negative.    ?Respiratory: Negative.    ?Cardiovascular: Negative.   ?Gastrointestinal:  Positive for abdominal pain (chronic) and diarrhea (chronic).  ?Genitourinary: Negative.   ?Musculoskeletal: Negative.   ?Skin: Negative.   ?Neurological: Negative.   ?All other systems reviewed and are negative. ? ?Physical Exam ?Updated Vital Signs ?BP 122/87   Pulse 86   Temp 97.7 ?F (36.5 ?C) (Oral) Comment: Simultaneous filing. User may not have seen previous data. Comment (Src): Simultaneous filing. User may not have seen previous data.  Resp 16   Ht '5\' 6"'$  (1.676 m)   Wt 65.8 kg   LMP 05/09/2014   SpO2 98%   BMI 23.40 kg/m?  ?Physical Exam ?Vitals and nursing note reviewed.  ?Constitutional:   ?   General: She is not in acute distress. ?   Appearance: She is well-developed. She is not ill-appearing.  ?   Comments: Difficult exam due to patient compliance  ?HENT:  ?   Head: Atraumatic.  ?Eyes:  ?   Pupils: Pupils are equal, round, and reactive to light.  ?Cardiovascular:  ?   Rate and Rhythm: Normal rate.  ?Pulmonary:  ?   Effort: No respiratory distress.  ?Abdominal:  ?   General: There is no distension.  ?   Tenderness: There is generalized abdominal tenderness. There is no guarding or rebound.   ?Musculoskeletal:     ?   General: Normal range of motion.  ?   Cervical back: Normal range of motion.  ?Skin: ?   General: Skin is warm and dry.  ?Neurological:  ?   General: No focal deficit present.  ?   Mental Status: She is alert.  ?Psychiatric:     ?   Mood and Affect: Mood normal.  ?   Comments: Agitated   ? ?ED Results / Procedures / Treatments   ?Labs ?(all labs ordered are listed, but only abnormal results are displayed) ?Labs Reviewed  ?LIPASE, BLOOD - Abnormal; Notable for the following components:  ?    Result Value  ? Lipase  54 (*)   ? All other components within normal limits  ?COMPREHENSIVE METABOLIC PANEL - Abnormal; Notable for the following components:  ? CO2 20 (*)   ? Calcium 8.7 (*)   ? All other components within normal limits  ?CBC  ?URINALYSIS, ROUTINE W REFLEX MICROSCOPIC  ?I-STAT BETA HCG BLOOD, ED ([facility], [facility], [facility] ONLY)  ? ? ?EKG ?None ? ?Radiology ?No results found. ? ?Procedures ?Procedures  ? ? ?Medications Ordered in ED ?Medications - No data to display ? ?ED Course/ Medical Decision Making/ A&P ?  ?56 year old here for evaluation of ongoing abdominal pain and diarrhea over the last year however states she has been feeling unwell over the last "20 years."  She is followed by [facility] gastroenterology.  Afebrile, nonseptic, not ill-appearing.  Unfortunately when talking with patient this sounds like this has been ongoing for quite some time. She seems very frustrated that no one can figure out why she has been in pain for this long. Pain today is unchanged from her chronic pain. ? ?Unfortunately she had a wait time of 6 hours in the waiting room. By the time I saw her she was very frustrated and agitated.  Sounds like she was concerned about gallstones that were seen on a prior CT scan 1 month ago.  I offered imaging for further assessment however patient declined as well as declining pain medication, stating she will get her GI to schedule an [place] outpatient. She is refusing further work-up  here in the emergency department and states "I just want to go home." Apparently she was supposed to be written out on short-term disability and this has not occurred yet.  She has requested that I write her out of work for a [employer]

## 2021-07-05 NOTE — Telephone Encounter (Signed)
Second post procedure follow up call, no answer 

## 2021-07-05 NOTE — Discharge Instructions (Signed)
Follow up with your GI doctor

## 2021-07-05 NOTE — ED Notes (Signed)
Discharge instructions reviewed. Work note provided. Pt encouraged to call GI doctor today. Pt states understanding and no further questions. Pt ambulatory with steady gait upon discharge. No s/s of distress noted. ? ?

## 2021-07-06 ENCOUNTER — Other Ambulatory Visit: Payer: Self-pay

## 2021-07-06 ENCOUNTER — Encounter: Payer: Self-pay | Admitting: Gastroenterology

## 2021-07-06 DIAGNOSIS — R768 Other specified abnormal immunological findings in serum: Secondary | ICD-10-CM

## 2021-07-06 DIAGNOSIS — R899 Unspecified abnormal finding in specimens from other organs, systems and tissues: Secondary | ICD-10-CM

## 2021-07-06 DIAGNOSIS — R109 Unspecified abdominal pain: Secondary | ICD-10-CM

## 2021-07-06 NOTE — Telephone Encounter (Signed)
Order placed for RUQ [place]. Secure staff message sent to April and Vivien Rota for scheduling.  ?

## 2021-07-06 NOTE — Telephone Encounter (Signed)
I received the biopsy results, and sent a separate message via MyChart to the patient.  Essentially normal duodenal and gastric biopsies without any evidence of Celiac Disease, villous blunting, chronic or active inflammation, Helicobacter pylori infection, or other worrisome features. ? ?Can order RUQ ultrasound to evaluate GB sludge and possible stones that were seen on prior CT.  Can trial IBgard in addition to the Bentyl and Levbid.  Can also trial glutamine 5 g 3 times daily.  ? ? ?

## 2021-07-07 LAB — CALPROTECTIN: Calprotectin: 66 mcg/g

## 2021-07-10 LAB — TISSUE TRANSGLUTAMINASE, IGA: (tTG) Ab, IgA: <1 U/mL

## 2021-07-10 LAB — VASOACTIVE INTESTINAL PEPTIDE (VIP): Vasoactive Intest Polypeptide: <50 pg/mL (ref <78)

## 2021-07-10 LAB — IGA: Immunoglobulin A: 1068 mg/dL — ABNORMAL HIGH (ref 47–310)

## 2021-07-10 LAB — GASTRIN: Gastrin: 19 pg/mL (ref <=100)

## 2021-07-10 LAB — CALCITONIN: Calcitonin: <2 pg/mL (ref <=5)

## 2021-07-10 LAB — SOMATOSTATIN: SOMATOSTATIN: 25 pg/mL

## 2021-07-13 ENCOUNTER — Encounter: Payer: Self-pay | Admitting: Gastroenterology

## 2021-07-14 ENCOUNTER — Telehealth: Payer: Self-pay

## 2021-07-14 NOTE — Telephone Encounter (Signed)
?  Carole Binning, LPN  Marice Potter, RN ?We appreciate your referral. Each referral is reviewed by our providers to ensure that the patient will receive the best medical care possible. Upon review of this referral Dr. Estanislado Pandy has declined it.  Dr. Estanislado Pandy recommends a referral to Sports Medicine for EDS.  ?

## 2021-07-14 NOTE — Telephone Encounter (Signed)
Referral faxed to [facility].  ?

## 2021-07-15 ENCOUNTER — Ambulatory Visit (HOSPITAL_COMMUNITY)
Admission: RE | Admit: 2021-07-15 | Discharge: 2021-07-15 | Disposition: A | Discharge status: Home / Self Care | Payer: BC Managed Care – PPO | Source: Ambulatory Visit | Attending: Gastroenterology | Admitting: Gastroenterology

## 2021-07-15 DIAGNOSIS — K828 Other specified diseases of gallbladder: Secondary | ICD-10-CM | POA: Diagnosis not present

## 2021-07-15 DIAGNOSIS — R109 Unspecified abdominal pain: Secondary | ICD-10-CM | POA: Diagnosis not present

## 2021-07-18 ENCOUNTER — Other Ambulatory Visit: Payer: Self-pay | Admitting: Neurology

## 2021-07-19 ENCOUNTER — Telehealth: Payer: Self-pay | Admitting: Gastroenterology

## 2021-07-19 ENCOUNTER — Other Ambulatory Visit: Payer: Self-pay

## 2021-07-19 ENCOUNTER — Encounter: Payer: Self-pay | Admitting: Gastroenterology

## 2021-07-19 NOTE — Telephone Encounter (Signed)
Inbound call from patient would like a call back because she is confused about what tested is needed to help constant uncontrolled diarrhea and stomach pain she is having. States she need to know the next steps for treatment  ?

## 2021-07-19 NOTE — Telephone Encounter (Signed)
Spoke with pt who states she has continued to have diarrhea and stomach pain. Pt reports that stomach pain is all over abd but mainly on right side and in chest. Pt reports that the chest pain only occurs when she breathes. Pt states she feels like abd is bloated and sensitive to touch. Pt states she went to the ED on march 27th for the nausea and vomiting and they gave her phenergan in her IV which helped. Pt states currently she is able to keep down liquids but is unable to eat solid food. Pt reports she has about 3-4 bm's back to back then she takes lomotil and that will help and she won't go for a few days. Pt states she has had "night sweats and a low grade fever at 98-99". Pt is concerned about gallstones that were found in recent abd ultrasound and wanted to know what was being done about that. Let pt know that Dr. Bryan Lemma is out of the office has not reviewed her recent test. Pt also wanted to know why she was being referred to a Rheumatologist. Let pt know that she was being referred to rheumatology for her elevated IgA. Pt stated that she has an elevated IgA because of the Ehlers-Danlos syndrome. Pt doesn't think she needs to see rheumatology and is concerned she has UC or Crohn's. Pt also states she needs pain medication for her abd pain. Asked pt if the bentyl has helped and pt stated that bentyl helps the spasms but she still has abd pain.  ? ?Dr. Lyndel Safe as DOD please advise.  ? ? ?

## 2021-07-19 NOTE — Telephone Encounter (Signed)
Left message for pt to call back  °

## 2021-07-19 NOTE — Telephone Encounter (Signed)
I have reviewed her extensive GI work-up ?-EGD 07/01/2021: small HH, Schatzki's ring. Neg SB Bx for celiac, negative HP ?-Colonoscopy 10/2020: Normal to TI, negative biopsies for microscopic colitis ?-CT Abdo/pelvis 2/23 negative except for mild diffuse colitis ?-Ultrasound showing small gallstones ?She had normal CBC, CMP, lipase recently ?Negative stool studies including stool for calprotectin,  ?IgA level was elevated ? ?Doubt if she has Crohn's disease given extensive negative work-up ? ?I believe she has IBS-D.  Doubt if it is the gallbladder.  We had to put her through surgery unnecessarily as it will cause the diarrhea to get worse. ? ?Plan: ?-Lets give her a trial of Levbid 0.'375mg'$  po BID #60. ?-Also trial of gluten-free diet x 2 weeks (does not have celiac but may have nonceliac gluten sensitivity) ?-Stop all vitamins especially with magnesium in it ?-Let us know how she is in 2 weeks.  If still with diarrhea, trial of cholestyramine. ? ?RG ? ?

## 2021-07-20 ENCOUNTER — Telehealth: Payer: Self-pay | Admitting: Gastroenterology

## 2021-07-20 ENCOUNTER — Other Ambulatory Visit: Payer: Self-pay

## 2021-07-20 NOTE — Telephone Encounter (Signed)
Called pt to see which pharmacy she prefers. Left message for pt to call back.  ?

## 2021-07-20 NOTE — Telephone Encounter (Signed)
Left message to call back. Received Disability forms from [employer]. Called pt to come to the office to fill out pt intake and release of information forms. Forms can be faxed over too if pt has a fax number. ? ?

## 2021-07-20 NOTE — Telephone Encounter (Signed)
Gave pt results and recommendations. Pt states she has been taking the Levbid 0.'375mg'$  po BID for the last 20 years and still has diarrhea. Is it ok to send in the cholestyramine?  ? ?Dr. Henrene Pastor as DOD please advise.  ?

## 2021-07-20 NOTE — Telephone Encounter (Signed)
Yes. ?Questran 4 g twice daily. ?Not to be taken within 2 hours of other medications. ?

## 2021-07-21 ENCOUNTER — Other Ambulatory Visit: Payer: Self-pay

## 2021-07-21 DIAGNOSIS — G894 Chronic pain syndrome: Secondary | ICD-10-CM | POA: Diagnosis not present

## 2021-07-21 DIAGNOSIS — M797 Fibromyalgia: Secondary | ICD-10-CM | POA: Diagnosis not present

## 2021-07-21 MED ORDER — CHOLESTYRAMINE 4 GM/DOSE PO POWD: 4.0000 g | Freq: Two times a day (BID) | ORAL | 1 refills | Status: DC

## 2021-07-21 NOTE — Telephone Encounter (Signed)
Sent prescription to pt's preferred pharmacy. Pt verbalized understanding and had no other concerns at end of call.  ?

## 2021-07-22 NOTE — Telephone Encounter (Signed)
Rheumatology has declined the referral and instead recommends referral to Sports Medicine. If patient agreeable, can place referral to Sports Med for EDS and elevated IgA.  ?

## 2021-07-22 NOTE — Telephone Encounter (Signed)
Spoke with pt on 4/10 (see 4/10 telephone encounter) and she stated that she saw a Rheumatologist years ago and was diagnosed with Fredderick Phenix Danlos syndrome. Pt stated that is why her IgA is elevated. Pt symptoms were sent to DOD on 4/10 telephone note.  ?

## 2021-07-24 ENCOUNTER — Encounter: Payer: Self-pay | Admitting: Gastroenterology

## 2021-07-27 ENCOUNTER — Telehealth: Payer: Self-pay

## 2021-07-27 MED ORDER — RIFAXIMIN 550 MG PO TABS: 550.0000 mg | ORAL_TABLET | Freq: Three times a day (TID) | ORAL | 2 refills | Status: AC

## 2021-07-27 NOTE — Telephone Encounter (Signed)
Sent rifaximin 550 mg tid x14 days, RF 2 for IBS-D. Patient is informed. PA is initiated. ?

## 2021-07-27 NOTE — Telephone Encounter (Signed)
-----   Message from Lavena Bullion, DO sent at 07/27/2021  3:36 PM EDT ----- ?Regarding: RE: referral ?Yes, it is ok to take the Rifaxmin with the hyoscyamine.  ? ?----- Message ----- ?From: Howell Pringle, CMA ?Sent: 07/27/2021   3:14 PM EDT ?To: Lavena Bullion, DO ?Subject: RE: referral                                  ? ?Spoke with the patient. Stated she has been taking Hyoscyamine 0.375 mg ER for years for her IBS now prescribing by Dr. Darnell [facility]. Stated she is having severe abd. pain for long time now. Diarrhea has improved. She was asking whether she can take this med with rifaximin or not? She wanted to discuss what's next step for her treatment and was very concerned with her issues. She expressed to see you rather at the office. So I made an office visit with you on 08/05/21.  ? ?She deferred Sports med referral at this time stating that she has already seen by Sport Medicine years ago and was diagnosed Ehler-danlos. PCP already referred her to immunologist. ?----- Message ----- ?From: Lavena Bullion, DO ?Sent: 07/27/2021   1:21 PM EDT ?To: Howell Pringle, CMA ?Subject: RE: referral                                  ? ?Yes, okay for referral to sports medicine if patient would like to proceed.  Not sure why they denied cholestyramine.  Can instead trial rifaximin 550 mg tid x14 days, RF 2 for IBS-D. ?----- Message ----- ?From: Howell Pringle, CMA ?Sent: 07/27/2021   1:11 PM EDT ?To: Lavena Bullion, DO ?Subject: referral                                      ? ?Mickel Baas wanted me to ask you whether you would like to refer her for sports medicine? ?[employer] denied her Cholestyramine, prescribed by Dr. Henrene Pastor when you were out. Any Suggestions? Thanks. ? ? ? ? ?

## 2021-07-28 ENCOUNTER — Other Ambulatory Visit: Payer: Self-pay | Admitting: Neurology

## 2021-07-28 NOTE — Telephone Encounter (Signed)
Received fax from [facility] stating that [place] doesn't require PA. ?

## 2021-08-05 ENCOUNTER — Ambulatory Visit (INDEPENDENT_AMBULATORY_CARE_PROVIDER_SITE_OTHER): Payer: BC Managed Care – PPO | Admitting: Gastroenterology

## 2021-08-05 ENCOUNTER — Encounter: Payer: Self-pay | Admitting: Gastroenterology

## 2021-08-05 VITALS — BP 100/68 | HR 75 | Ht 66.0 in | Wt 145.5 lb

## 2021-08-05 DIAGNOSIS — K580 Irritable bowel syndrome with diarrhea: Secondary | ICD-10-CM | POA: Diagnosis not present

## 2021-08-05 DIAGNOSIS — R109 Unspecified abdominal pain: Secondary | ICD-10-CM | POA: Diagnosis not present

## 2021-08-05 DIAGNOSIS — R140 Abdominal distension (gaseous): Secondary | ICD-10-CM

## 2021-08-05 NOTE — Progress Notes (Signed)
? ?Chief Complaint:    IBS-D, procedure follow-up ? ?GI History: 56 year old female with a history of fibromyalgia, carpal tunnel syndrome, arthritis, Ehlers-Danlos syndrome, IBS, asthma. ? ?IBS: Longstanding history of IBS-D dating back 20+ years.  Symptoms had been generally well controlled until acute onset abdominal cramping, diarrhea in 06/2020.  Did have right arm surgery on 07/08/2020 and took vancomycin perioperatively.  Has been having ongoing, intermittent watery, nonbloody stools since then. ?- 07/11/2020: ER evaluation: Normal CBC, CMP.  CT with mild wall thickening throughout the colon mostly distal to hepatic flexure, 4.5 cm uterine fibroid.  Discharged with Cipro/Flagyl ?- 07/20/2020: GI evaluation.  Negative GI PCR panel.  Trialed course of Bentyl and Imodium ?- 10/27/2020: Colonoscopy: Normal colon (biopsies negative for MC, IBD), grade 1 internal hemorrhoids.  Normal TI.  Repeat 10 years ?- 11/09/2020: GI follow-up.  Trialed course of FD guard for abdominal cramping/dyspepsia.  Diarrhea resolved at that time. ?- 05/24/2021: ER evaluation for diarrhea.  Reportedly ongoing symptoms despite 24 (2 mg) tablets of Imodium in 24 hours.  Improved with IV fluids and discharged home with Rx for Bentyl, Levsin ?- 06/08/2021: ER evaluation for diarrhea and abdominal pain.  HD stable, benign exam.  Lipase 114, otherwise normal CMP, CBC.  C. difficile and GI PCR panel negative.  CT with thickening of entire colon concerning for colitis.  Normal pancreas.  Treated with IVF, Bentyl, IV droperidol, GI cocktail with improvement and discharged home with Phenergan. ?- 06/24/2021: GI follow-up.  Ongoing diarrhea and lower abdominal pain.  Intermittent nausea/vomiting.  Normal/negative fecal pancreatic elastase, Giardia, fecal calprotectin, ESR, CRP, 5 HIAA, TTG, VIP, fasting gastrin, somatostatin, calcitonin.  IgA elevated at 1068 (declined referral to Rheumatology as she has been seen before and feels elevated IgA related to  Ehlers-Danlos).  Repeat lipase normal. ?- 07/01/2021: EGD small HH, nonobstructing Schatzki's ring, otherwise normal including normal gastric/duodenal biopsies ?- 07/05/2021: ER evaluation for diarrhea and abdominal pain.  Unremarkable work-up to include CBC, CMP.  Lipase 54. ?- 07/15/2021: RUQ [place]: GB stones measuring up to 4 mm without cholecystitis.  No duct dilation.  At least 2 hepatic cysts with thin internal septations measuring up to 2.1 cm.   ? ?  ?Follows at [facility] and takes several vitamins/supplements. ?  ?History of symptomatic hemorrhoids. ?- 12/04/2020: Banding of RA hemorrhoid ?- 01/05/2021: Banding of LL hemorrhoid ?- 03/02/2021: Banding of RP hemorrhoid ? ?HPI:   ? ? ?Patient is a 56 y.o. female presenting to the Gastroenterology Clinic for follow-up.  Last seen by me on 06/24/2021.  Main issue at that time was worsening diarrhea and lower abdominal pain.  Was seen in the ER x2 the month prior as outlined above.  Diarrhea not responsive to Imodium.  Further extended lab testing again unrevealing as above.  Completed EGD last month and presents today to discuss results and ongoing diarrhea. ? ?Recently prescribed trial of Levbid then Questran 4 g twice daily (denied by insurance) and now on Xifaxan.  ? ?She reports diarrhea and abdominal pain improving since starting Xifaxan. Will use hyoscyamine BID; changing to prn today. Lomotil prn has helped as well.  No nausea/vomiting. ? ?Trying to get in with [facility] for evaluation/treatment of Ehlers-Danlos.   ? ? ?Review of systems:     No chest pain, no SOB, no fevers, no urinary sx  ? ?Past Medical History:  ?Diagnosis Date  ? Allergy   ? Angio-edema   ? Arthritis   ? Asthma   ?  Carpal tunnel syndrome 07/07/2015  ? right  ? Chronic cough 12/02/2015  ? Chronic pain syndrome 08/10/2016  ? Ehlers-Danlos syndrome   ? Essential alopecia of women 04/14/2011  ? Family history of breast cancer in female 08/26/2015  ? (x2) paternal 1st  cousins dx in their 79s   ? Fibrocystic breast changes of both breasts 07/19/2015  ? Fibromyalgia   ? HLD (hyperlipidemia)   ? Hypermobility syndrome 11/26/2014  ? Based on Fam Hx and Beighton Score of 7 this is likely ED III   ? Hypertrophy of vulva 07/20/2017  ? Inflammatory dermatosis 04/14/2011  ? Irritable larynx syndrome 12/02/2015  ? Lumbar radiculopathy 02/16/2016  ? Osteoarthritis 04/01/2017  ? Osteopenia   ? Primary arthrosis of first carpometacarpal joints, bilateral 06/01/2015  ? Sacroiliac joint dysfunction of right side 11/26/2014  ? B SI joint xrays last done at [facility] Dr. Kathlene November 03/13/2017  ? Skin cancer   ? ? ?Patient's surgical history, family medical history, social history, medications and allergies were all reviewed in Epic  ? ? ?Current Outpatient Medications  ?Medication Sig Dispense Refill  ? Acetaminophen (TYLENOL 8 HOUR ARTHRITIS PAIN PO) Take 1,300 mg by mouth in the morning and at bedtime.    ? Albuterol Sulfate (PROAIR RESPICLICK) 081 (90 Base) MCG/ACT AEPB Inhale into the lungs as needed.    ? Ascorbic Acid (VITAMIN C PO) Take 1,200 mg by mouth 2 (two) times daily.     ? CHITOSAN PO Take 1,000 mg by mouth in the morning and at bedtime.    ? Cholecalciferol (VITAMIN D3) 1.25 MG (50000 UT) TABS Take 1 tablet by mouth once a week.    ? clonazePAM (KLONOPIN) 0.5 MG tablet Take 1 tablet (0.5 mg total) by mouth at bedtime. 30 tablet 1  ? cycloSPORINE (RESTASIS) 0.05 % ophthalmic emulsion Place 1 drop into both eyes 2 (two) times daily.    ? dicyclomine (BENTYL) 20 MG tablet Take 1 tablet (20 mg total) by mouth 2 (two) times daily. 30 tablet 0  ? diphenoxylate-atropine (LOMOTIL) 2.5-0.025 MG tablet Take 1 tablet by mouth 4 (four) times daily as needed for diarrhea or loose stools. 30 tablet 3  ? estradiol (VIVELLE-DOT) 0.075 MG/24HR Place onto the skin every 3 (three) days.    ? FERROUS BISGLYCINATE CHELATE PO Take 18 mg by mouth daily.    ? fexofenadine (ALLEGRA) 180 MG tablet Take 180 mg by  mouth daily.    ? folic acid (FOLVITE) 448 MCG tablet Take 800 mcg by mouth daily.    ? gabapentin (NEURONTIN) 300 MG capsule Take 300 mg by mouth at bedtime.    ? hyoscyamine (LEVBID) 0.375 MG 12 hr tablet Take 0.375 mg by mouth 2 (two) times daily.    ? LEVOCETIRIZINE DIHYDROCHLORIDE PO Take 5 mg by mouth at bedtime.    ? MAGNESIUM MALATE PO Take 450 mg by mouth in the morning and at bedtime.    ? MELATONIN PO Take 30 mg by mouth at bedtime.    ? Menaquinone-7 (VITAMIN K2 PO) Take 150 mcg by mouth every morning.    ? montelukast (SINGULAIR) 10 MG tablet Take 1 tablet by mouth at bedtime.    ? mupirocin ointment (BACTROBAN) 2 % Apply 1 application topically 3 (three) times daily as needed.    ? NALTREXONE HCL PO Take 6 mg by mouth at bedtime.    ? OVER THE COUNTER MEDICATION 306 mg every morning. Estrogen control    ? OVER THE  COUNTER MEDICATION 2 capsules every morning. MTHFR Endure (RIH)    ? PHOSPHATIDYLSERINE PO 200 mg every morning.    ? Pregnenolone Micronized (PREGNENOLONE PO) Take 75 mg by mouth 3 (three) times a week.    ? progesterone (PROMETRIUM) 200 MG capsule progesterone micronized 200 mg capsule ? TAKE 3 CAPSULES EVERY NIGHT    ? promethazine (PHENERGAN) 25 MG tablet Take 1 tablet (25 mg total) by mouth every 6 (six) hours as needed for nausea or vomiting. 30 tablet 0  ? rifaximin (XIFAXAN) 550 MG TABS tablet Take 1 tablet (550 mg total) by mouth 3 (three) times daily. 42 tablet 2  ? sertraline (ZOLOFT) 25 MG tablet TAKE 1 TABLET BY MOUTH EVERY DAY 90 tablet 2  ? Testosterone 75 MG PLLT 75 mg every 3 (three) months. Insert 1 pellet every 3 months    ? traZODone (DESYREL) 100 MG tablet trazodone 100 mg tablet ? TAKE 1-2 TABLET BY MOUTH AT BEDTIME    ? ?No current facility-administered medications for this visit.  ? ? ?Physical Exam:   ? ? ?BP 100/68   Pulse 75   Ht _0  (1.676 m)   Wt 145 lb 8 oz (66 kg)   LMP 05/09/2014   SpO2 98%   BMI 23.48 kg/m?  ? ?GENERAL:  Pleasant female in NAD ?PSYCH:  : Cooperative, normal affect ?Musculoskeletal:  Normal muscle tone, normal strength ?NEURO: Alert and oriented x 3, no focal neurologic deficits ? ? ?IMPRESSION and PLAN:   ? ?1) IBS-D ?2) Abdominal pain/cramping ?3)

## 2021-08-05 NOTE — Patient Instructions (Addendum)
If you are age 54 or older, your body mass index should be between 23-30. Your Body mass index is 23.48 kg/m?Marland Kitchen If this is out of the aforementioned range listed, please consider follow up with your Primary Care Provider. ? ?If you are age 64 or younger, your body mass index should be between 19-25. Your Body mass index is 23.48 kg/m?Marland Kitchen If this is out of the aformentioned range listed, please consider follow up with your Primary Care Provider.  ? ?Due to recent changes in healthcare laws, you may see the results of your imaging and laboratory studies on MyChart before your provider has had a chance to review them.  We understand that in some cases there may be results that are confusing or concerning to you. Not all laboratory results come back in the same time frame and the provider may be waiting for multiple results in order to interpret others.  Please give [place] 48 hours in order for your provider to thoroughly review all the results before contacting the office for clarification of your results.  ? ? ?Follow up as needed with Dr Suzan Nailer. ? ?We want to thank you for trusting [facility] with your care. All of our staff and providers value the relationships we have built with our patients, and it is an honor to care for you.  ? ?We are writing to let you know that [facility] will close on Aug 23, 2021, and we invite you to continue to see Dr. Carmell Austria and Gerrit Heck at the [facility] [place] office location. We are consolidating our serices at these [facility] practices to better provide care. Our office staff will work with you to ensure a seamless transition.  ? ?Gerrit Heck, DO -Dr. Bryan Lemma will be movig to [facility] at [address] [place], [place] [address], effective Aug 23, 2021.  Contact [phone] to schedule an appointment with him.  ? ?Carmell Austria, MD- Dr. Lyndel Safe will be movig to [facility] at [address] [place], [place] [address], effective Aug 23, 2021.  Contact [phone] to schedule an appointment with him.  ? ?Requesting Medical Records ?If you need to request your medical records, please follow the instructions below. Your medical records are confidential, and a copy can be transferred to another provider or released to you or another person you designate only with your permission. ? ?There are several ways to request your medical records: ?Requests for medical records can be submitted through our practice.   ?You can also request your records electronically, in your MyChart account by selecting the ?Request Health Records? tab.  ?If you need additional information on how to request records, please go to [email or web address] choose Patient Information, then select Request Medical Records. ?To make an appointment or if you have any questions about your health care needs, please contact our office at [phone] and one of our staff members will be glad to assist you. ?[facility] is committed to providing exceptional care for you and our community. Thank you for allowing [place] to serve your health care needs. ?Sincerely, ? ?Windy Canny, Director [facility] ?[facility] also offers convenient virtual care options. Sore throat? Sinus problems? Cold or flu symptoms? Get care from the comfort of home with [facility] Video Visits and e-Visits. Learn more about the non-emergency conditions treated and start your virtual visit at [email or web address]  ?

## 2021-08-12 ENCOUNTER — Encounter: Payer: Self-pay | Admitting: Allergy

## 2021-08-12 ENCOUNTER — Ambulatory Visit (INDEPENDENT_AMBULATORY_CARE_PROVIDER_SITE_OTHER): Payer: BC Managed Care – PPO | Admitting: Allergy

## 2021-08-12 VITALS — BP 104/78 | HR 78 | Temp 97.9°F | Resp 16 | Ht 66.0 in | Wt 146.2 lb

## 2021-08-12 DIAGNOSIS — R768 Other specified abnormal immunological findings in serum: Secondary | ICD-10-CM

## 2021-08-12 DIAGNOSIS — H1013 Acute atopic conjunctivitis, bilateral: Secondary | ICD-10-CM | POA: Diagnosis not present

## 2021-08-12 DIAGNOSIS — Q796 Ehlers-Danlos syndrome, unspecified: Secondary | ICD-10-CM

## 2021-08-12 DIAGNOSIS — J4540 Moderate persistent asthma, uncomplicated: Secondary | ICD-10-CM

## 2021-08-12 DIAGNOSIS — J3089 Other allergic rhinitis: Secondary | ICD-10-CM

## 2021-08-12 DIAGNOSIS — D801 Nonfamilial hypogammaglobulinemia: Secondary | ICD-10-CM | POA: Diagnosis not present

## 2021-08-12 MED ORDER — FEXOFENADINE HCL 180 MG PO TABS: 180.0000 mg | ORAL_TABLET | Freq: Every day | ORAL | 5 refills | Status: DC

## 2021-08-12 MED ORDER — EPINEPHRINE 0.3 MG/0.3ML IJ SOAJ: 0.3000 mg | INTRAMUSCULAR | 2 refills | Status: DC | PRN

## 2021-08-12 MED ORDER — MONTELUKAST SODIUM 10 MG PO TABS: 10.0000 mg | ORAL_TABLET | Freq: Every day | ORAL | 5 refills | Status: DC

## 2021-08-12 MED ORDER — LEVOCETIRIZINE DIHYDROCHLORIDE 5 MG PO TABS: 5.0000 mg | ORAL_TABLET | Freq: Every day | ORAL | 5 refills | Status: DC

## 2021-08-12 NOTE — Progress Notes (Signed)
? ? ?New Patient Note ? ?RE: Lindsey [name] MRN: [id number] DOB: Aug 10, 1965 ?Date of Office Visit: 08/12/2021 ? ?Primary care provider: [facility] [place] ? ?Chief Complaint: Elevated IgA ? ?History of present illness: ?Lindsey [name] is a 56 y.o. female presenting today for evaluation of elevated IgA.  She is a former patient of mine with her last visit on 01/19/2018 where she had patch testing done.  She has history of contact dermatitis, allergic rhinitis with conjunctivitis and asthma. ?She was on allergen immunotherapy that she started in 2019 and stopped in 2020.  She states that she was on immunotherapy she did see an improvement in her overall allergy symptoms she will be interested in resuming again. ? ?She returns today however as she states this in the 4th referral she has had for EDS and has seen 3 rheumatologist already.  During the work-up for symptoms she has undergone thus far extensive GI evaluation and with these labs she was found to have an elevated IgA.  She states she was told she needs to see an immunologist for possible biologic to help lower this level.  She has had quite significant GI issues with diarrhea, nausea, vomiting. ? ?She was diagnosed with EDS by Dr. Oneida Alar in sports medicine.  She has not yet identified an EDS specialist in the area.  She states the EDS specialist is through the [facility] clinic.  She reports having lot of symptoms related to EDS and does have a lot of pain.  She states she has discussed going to pain management.  She has been on pain medications in the past and they have not really been helpful.  Due to the degree of pain she is having she is quite short of breath at times.  She is not able to do the things she used to do in the past like running. ? ?In regards to the elevated IgE she has not had further testing done to assess her other immunoglobulins.  She does not have the significant history of infections.  She is up-to-date from a  vaccination standpoint. ? ?In regards to her previous atopic issues she is still taking Singulair daily.  She states this does help with her asthma symptom control.  She definitely can tell if she is not taking this medication.  She states she may need to use albuterol about 5-6 times a month during spring pollen season.  She does continue to take Allegra in the morning and Xyzal in the evening.  She is not currently using any nasal sprays. ? ? ?Review of systems: ?Review of Systems  ?Constitutional:  Positive for fatigue.  ?HENT: Negative.    ?Eyes: Negative.   ?Respiratory:  Positive for shortness of breath.   ?Cardiovascular: Negative.   ?Gastrointestinal:  Positive for diarrhea.  ?Musculoskeletal:   ?     Significant musculoskeletal pain  ?Skin: Negative.   ?Allergic/Immunologic: Negative.   ?Neurological: Negative.   ? ?All other systems negative unless noted above in HPI ? ?Past medical history: ?Past Medical History:  ?Diagnosis Date  ? Allergy   ? Angio-edema   ? Arthritis   ? Asthma   ? Carpal tunnel syndrome 07/07/2015  ? right  ? Chronic cough 12/02/2015  ? Chronic pain syndrome 08/10/2016  ? Ehlers-Danlos syndrome   ? Essential alopecia of women 04/14/2011  ? Family history of breast cancer in female 08/26/2015  ? (x2) paternal 1st cousins dx in their 39s   ? Fibrocystic breast  changes of both breasts 07/19/2015  ? Fibromyalgia   ? HLD (hyperlipidemia)   ? Hypermobility syndrome 11/26/2014  ? Based on Fam Hx and Beighton Score of 7 this is likely ED III   ? Hypertrophy of vulva 07/20/2017  ? Inflammatory dermatosis 04/14/2011  ? Irritable larynx syndrome 12/02/2015  ? Lumbar radiculopathy 02/16/2016  ? Osteoarthritis 04/01/2017  ? Osteopenia   ? Primary arthrosis of first carpometacarpal joints, bilateral 06/01/2015  ? Sacroiliac joint dysfunction of right side 11/26/2014  ? B SI joint xrays last done at [facility] Dr. Kathlene November 03/13/2017  ? Skin cancer   ? ? ?Past surgical history: ?Past Surgical History:  ?Procedure  Laterality Date  ? BREAST EXCISIONAL BIOPSY Left 01/2019  ? atypical lobular hyperplasia  ? CARPAL TUNNEL RELEASE Right 06/2020  ? COLONOSCOPY    ? DENTAL SURGERY    ? ELBOW FRACTURE SURGERY Left   ? as child  ? RADIOACTIVE SEED GUIDED EXCISIONAL BREAST BIOPSY Left 02/05/2019  ? Procedure: RADIOACTIVE SEED GUIDED EXCISIONAL LEFT BREAST BIOPSY;  Surgeon: Rolm Bookbinder, MD;  Location: [facility];  Service: General;  Laterality: Left;  ? ? ?Family history:  ?Family History  ?Problem Relation Age of Onset  ? Hypertension Mother   ? Other Mother   ?     hx of hysterectomy at 39-56 for prolapsed uterus; hx of benign L arm cyst in her 10s  ? Stroke Father   ? Hypertension Father   ? Skin cancer Father 14  ?     NOS  ? Other Father   ?     enlarged prostate s/p surgery  ? Alzheimer's disease Maternal Aunt   ? Stomach cancer Paternal Aunt   ?     d. 71; was a Manufacturing engineer and did not go to the doctor, so not sure age of onset  ? Congestive Heart Failure Maternal Grandmother   ? Prostate cancer Maternal Grandfather   ?     dx. 75-76  ? Heart Problems Paternal Grandmother   ? Heart Problems Paternal Grandfather   ? Colon cancer Other   ?     maternal great aunt (MGM's sister) dx in her 57s  ? Heart attack Paternal Uncle 10  ? Heart Problems Paternal Uncle   ? Angina Paternal Uncle   ? Breast cancer Cousin   ?     paternal 1st cousin d. 99  ? Breast cancer Cousin   ?     paternal 1st cousin dx. 53s  ? Colon polyps Neg Hx   ? Esophageal cancer Neg Hx   ? Rectal cancer Neg Hx   ? ? ?Social history: ?Lives in a [place].  She is a non-smoker.  She works as a Building control surveyor. ? ? ?Medication List: ?Current Outpatient Medications  ?Medication Sig Dispense Refill  ? Acetaminophen (TYLENOL 8 HOUR ARTHRITIS PAIN PO) Take 1,300 mg by mouth in the morning and at bedtime.    ? Albuterol Sulfate (PROAIR RESPICLICK) 952 (90 Base) MCG/ACT AEPB Inhale into the lungs as needed.    ? Ascorbic Acid (VITAMIN C PO)  Take 1,200 mg by mouth 2 (two) times daily.     ? CHITOSAN PO Take 1,000 mg by mouth in the morning and at bedtime.    ? Cholecalciferol (VITAMIN D3) 1.25 MG (50000 UT) TABS Take 1 tablet by mouth once a week.    ? clonazePAM (KLONOPIN) 0.5 MG tablet Take 1 tablet (0.5 mg total) by mouth  at bedtime. 30 tablet 1  ? cycloSPORINE (RESTASIS) 0.05 % ophthalmic emulsion Place 1 drop into both eyes 2 (two) times daily.    ? dicyclomine (BENTYL) 20 MG tablet Take 1 tablet (20 mg total) by mouth 2 (two) times daily. 30 tablet 0  ? diphenoxylate-atropine (LOMOTIL) 2.5-0.025 MG tablet Take 1 tablet by mouth 4 (four) times daily as needed for diarrhea or loose stools. 30 tablet 3  ? EPINEPHrine 0.3 mg/0.3 mL IJ SOAJ injection Inject 0.3 mg into the muscle as needed for anaphylaxis. 1 each 2  ? estradiol (VIVELLE-DOT) 0.075 MG/24HR Place onto the skin every 3 (three) days.    ? FERROUS BISGLYCINATE CHELATE PO Take 18 mg by mouth daily.    ? folic acid (FOLVITE) 494 MCG tablet Take 800 mcg by mouth daily.    ? gabapentin (NEURONTIN) 300 MG capsule Take 300 mg by mouth at bedtime.    ? hyoscyamine (LEVBID) 0.375 MG 12 hr tablet Take 0.375 mg by mouth 2 (two) times daily.    ? levocetirizine (XYZAL) 5 MG tablet Take 1 tablet (5 mg total) by mouth at bedtime. 30 tablet 5  ? MAGNESIUM MALATE PO Take 450 mg by mouth in the morning and at bedtime.    ? MELATONIN PO Take 30 mg by mouth at bedtime.    ? Menaquinone-7 (VITAMIN K2 PO) Take 150 mcg by mouth every morning.    ? mupirocin ointment (BACTROBAN) 2 % Apply 1 application topically 3 (three) times daily as needed.    ? NALTREXONE HCL PO Take 6 mg by mouth at bedtime.    ? OVER THE COUNTER MEDICATION 306 mg every morning. Estrogen control    ? OVER THE COUNTER MEDICATION 2 capsules every morning. MTHFR Endure (RIH)    ? PHOSPHATIDYLSERINE PO 200 mg every morning.    ? Pregnenolone Micronized (PREGNENOLONE PO) Take 75 mg by mouth 3 (three) times a week.    ? progesterone (PROMETRIUM)  200 MG capsule progesterone micronized 200 mg capsule ? TAKE 3 CAPSULES EVERY NIGHT    ? promethazine (PHENERGAN) 25 MG tablet Take 1 tablet (25 mg total) by mouth every 6 (six) hours as needed for naus

## 2021-08-13 NOTE — Patient Instructions (Addendum)
Elevated IgA ?Ehlers-Danlos syndrome ?History of asthma, allergic rhinitis with conjunctivitis and contact dermatitis ? ?-Discussed today that immunologist are trained in detecting and managing disease states where there is a deficiency (i.e. low immunoglobulin levels or issues with recurrent infections) ?-Ehlers-Danlos syndrome is a complex disease state that may require multispecialty management that may require specialist in cardiology, orthopedic, rheumatology, dermatology, behavioral health care and genetics.   ?-To my knowledge Ehlers-Danlos syndrome has no close association with immunodeficiencies or immunologic states. ?-Furthermore elevated IgA may or may not cause any adverse effects.  I did discuss with her however that elevated IgE levels can pose a clot in her stroke risk however I have not seen in association with this with an elevated IgA.  There are also to my knowledge no biologic agents or medications that target IgE A to decrease production at the cellular level.   ?-She has not had more immunologic work-up thus I have ordered complete immunoglobulin levels to see if she has elevation across the board which would indicate a polyclonal gammopathy or if she has more of a monoclonal gammopathy.  I also ordered protein electrophoresis testing in case this is looking more like a monoclonal gammopathy.  Pending on the test results may help guide future referrals to appropriate specialist.  I am not concerned from immunodeficiency standpoint as she has not had any significant infections that would be of concern ?-Advised will help guide further specialty evaluation for Ehlers-Danlos syndrome ? ?-In regards to her atopic disease states (Asthma, allergic rhinitis with conjunctivitis and contact dermatitis) we will refill her medications including Singulair, Allegra, Xyzal and her epinephrine device.  She should continue to have access to albuterol inhaler. ?-In the future once Ehlers-Danlos syndrome and  symptoms associated with that have been better managed we can consider resuming allergen immunotherapy for allergy symptom control. ? ?

## 2021-08-16 ENCOUNTER — Other Ambulatory Visit: Payer: Self-pay | Admitting: Gastroenterology

## 2021-08-16 DIAGNOSIS — N951 Menopausal and female climacteric states: Secondary | ICD-10-CM | POA: Diagnosis not present

## 2021-08-16 DIAGNOSIS — R6882 Decreased libido: Secondary | ICD-10-CM | POA: Diagnosis not present

## 2021-08-17 DIAGNOSIS — M25571 Pain in right ankle and joints of right foot: Secondary | ICD-10-CM | POA: Diagnosis not present

## 2021-08-17 LAB — IFE, PE AND FLC, SERUM
Albumin SerPl Elph-Mcnc: 4.1 g/dL (ref 2.9–4.4)
Albumin/Glob SerPl: 1.3 (ref 0.7–1.7)
Alpha 1: 0.3 g/dL (ref 0.0–0.4)
Alpha2 Glob SerPl Elph-Mcnc: 0.8 g/dL (ref 0.4–1.0)
B-Globulin SerPl Elph-Mcnc: 1.9 g/dL — ABNORMAL HIGH (ref 0.7–1.3)
Gamma Glob SerPl Elph-Mcnc: 0.4 g/dL (ref 0.4–1.8)
Globulin, Total: 3.3 g/dL (ref 2.2–3.9)
Ig Kappa Free Light Chain: 12.4 mg/L (ref 3.3–19.4)
Ig Lambda Free Light Chain: 22.1 mg/L (ref 5.7–26.3)
IgA/Immunoglobulin A, Serum: 1399 mg/dL — ABNORMAL HIGH (ref 87–352)
IgG (Immunoglobin G), Serum: 443 mg/dL — ABNORMAL LOW (ref 586–1602)
IgM (Immunoglobulin M), Srm: 19 mg/dL — ABNORMAL LOW (ref 26–217)
KAPPA/LAMBDA RATIO: 0.56 (ref 0.26–1.65)
M Protein SerPl Elph-Mcnc: 1.3 g/dL — ABNORMAL HIGH
Total Protein: 7.4 g/dL (ref 6.0–8.5)

## 2021-08-17 LAB — IFE AND PE, RANDOM URINE
% BETA, Urine: 0 %
ALBUMIN, U: 100 %
ALPHA 1 URINE: 0 %
ALPHA-2-GLOBULIN, U: 0 %
GAMMA GLOBULIN URINE: 0 %
Protein, Ur: 4.4 mg/dL

## 2021-08-17 LAB — IGE: IgE (Immunoglobulin E), Serum: 30 IU/mL (ref 6–495)

## 2021-08-17 NOTE — Addendum Note (Signed)
Addended by: Jacqualin Combes on: 08/17/2021 06:03 PM ? ? Modules accepted: Orders ? ?

## 2021-08-18 DIAGNOSIS — Q796 Ehlers-Danlos syndrome, unspecified: Secondary | ICD-10-CM | POA: Diagnosis not present

## 2021-08-18 DIAGNOSIS — M797 Fibromyalgia: Secondary | ICD-10-CM | POA: Diagnosis not present

## 2021-08-18 DIAGNOSIS — G894 Chronic pain syndrome: Secondary | ICD-10-CM | POA: Diagnosis not present

## 2021-08-20 ENCOUNTER — Telehealth: Payer: Self-pay

## 2021-08-20 NOTE — Telephone Encounter (Signed)
Patient called in about her lab results that she saw on mychart and wanted to know what it all meant. Advised patient that I would have to wait until the doctor read the results, and made comments with detailed instructions before going over results with her. Will call patient back once results notes are available. ?

## 2021-08-24 ENCOUNTER — Encounter: Payer: Self-pay | Admitting: Neurology

## 2021-08-24 ENCOUNTER — Ambulatory Visit (INDEPENDENT_AMBULATORY_CARE_PROVIDER_SITE_OTHER): Payer: BC Managed Care – PPO | Admitting: Neurology

## 2021-08-24 VITALS — BP 107/73 | HR 89 | Ht 66.0 in | Wt 147.0 lb

## 2021-08-24 DIAGNOSIS — F5111 Primary hypersomnia: Secondary | ICD-10-CM | POA: Diagnosis not present

## 2021-08-24 NOTE — Progress Notes (Signed)
.[facility] ? ? ? ? ?SLEEP MEDICINE CLINIC ?  ? ?Provider:  [name], MD  ?Primary Care Physician:  [facility] @ [place] ?[address] ?[place] [address]  ? ?[place] Neurologist : Dr Jannifer Franklin, now retired.   ?Referring Provider: Rolm Bookbinder, MD.   ?  ?    ?Chief Complaint according to patient   ?Patient presents with:  ?  ? New Patient (Initial Visit)  ?   Pt last seen 01/14/20. Pt reports falling asleep walking, causing a bad sprain on her ankle. Falling asleep while eating, drinking, or daily activity  ?  ?  ?HISTORY OF PRESENT ILLNESS:  ?Lindsey [name] is a 56  Year- old  Caucasian female patient and was seen here upon Sleep consultation request by Dr. Rolm Bookbinder, in 2021-  ?Now here for a RV  on 08/24/2021.  ?Chief concern according to patient :  " I am in a lot of pain related to my Ehlers-Danlos condition, have seen Dr Jannifer Franklin for carpal tunnel and Fibromyalgia . I have back pain, wrist and joint pain, affecting my sleep too. My carpal tunnel surgery did not heal well, I have had joints replaced, have chronic IBS with diarrhea, and I am falling asleep in all situations, even while driving, while talking, while on my computer. My level of pain is severe - and I am going to a pain clinic next month." ?  ?October 27th 2020 I had "breast cancer surgery", and have been on tamoxifen until d/c in June 2021. These hormone changes came with insomnia. Atypical cells , rather than cancerous. I am now back on HRT and on Zoloft and klonopin , I am sleeping well at night, but I am now too sleepy in daytime to function.  ?  ?I have the pleasure of seeing Lindsey [name] now for hypersomnia sleep disorder.  She has a past medical history of Allergy, Angio-edema, Arthritis, allergies- seasonal and Asthma, Carpal tunnel syndrome ( dr. Willis-07/07/2015), variant cough (12/02/2015), Chronic pain syndrome (08/10/2016),  Essential alopecia of women (04/14/2011), Family history of breast cancer  in female (08/26/2015), Fibrocystic breast changes of both breasts (07/19/2015), Fibromyalgia, Ehlers-Danlos syndrome,Hypermobility syndrome (11/26/2014), Hypertrophy of vulva (07/20/2017), Inflammatory dermatosis (04/14/2011), Irritable larynx syndrome (12/02/2015), Lumbar radiculopathy (02/16/2016), Osteoarthritis (04/01/2017), Osteopenia, Primary arthrosis of first carpometacarpal joints, bilateral (06/01/2015), and Sacroiliac joint dysfunction of right side (11/26/2014). ?  ? ?  ? Family medical /sleep history: mother has insomnia since menopause.   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  ?Family status is single. No children.  ?The patient currently works 8-5. Pets are not present. ?Tobacco use- never .  ETOH use; seldomly,  ?Caffeine intake in form of Coffee( 2 a day) . ?Regular exercise in form of horseback riding.   ? ?  ?Sleep habits are as follows: The patient's dinner time is between 6-8 PM. The patient goes to bed at 10.30 PM and can initiate sleep well,she does now stay  asleep. She continues to sleep but wakes up some mornings too early , at 4 AM- no bathroom breaks.  ?She averages between 5-12 hours - much improved over March 2021. This cyclic hypersomnia alternating with reduced sleep time is indicating a psychological factor.  ?The preferred sleep position is prone , with the support of 1-2 pillows.  ?Dreams are reportedly infrequent/vivid- on trazodone, 100 mg. 0.25 mg klonopin, Zoloft.   ?7.30  AM is the usual rise time, she was in pain, has IBS,  and getting too sleepy in daytime-. She has been on short term disability.  ? ? ?Her Sleep study has not shown any fragmentation and normal sleep architecture. 2021. ?  ?Review of Systems: ?Out of a complete 14 system review, the patient complains of only the following symptoms, and all other reviewed systems are negative.:  ?Fatigue, sleepiness , snoring, fragmented sleep, Insomnia- early  morning waking. Feeling always tired.  ?IBS  ? ?HYPERSOMNIA - falling asleep without control.  ?Had sprained her ankle and spill coffee over her. ?Pain, pain, pain.  ?Not safe to drive.  ?Shortness of breath " may be deconditioned"   ?  ?How likely are you to doze in the following situations: ?0 = not likely, 1 = slight chance, 2 = moderate chance, 3 = high chance ?  ?Sitting and Reading? ?Watching Television? ?Sitting inactive in a public place (theater or meeting)? ?As a passenger in a car for an hour without a break? ?Lying down in the afternoon when circumstances permit? ?Sitting and talking to someone? ?Sitting quietly after lunch without alcohol? ?In a car, while stopped for a few minutes in traffic? ?  ?Total = 24 / 24 from previously  4/ 24 points . A very significant change, Cyclic Hypersomnia, sleep attacks.  ? ? FSS endorsed at  57/ 63 points.  ? ?Social History  ? ?Socioeconomic History  ? Marital status: Single  ?  Spouse name: n/a  ? Number of children: 0  ? Years of education: Not on file  ? Highest education level: Not on file  ?Occupational History  ? Occupation: Education administrator  ?  Comment: [employer]  ?Tobacco Use  ? Smoking status: Never  ? Smokeless tobacco: Never  ?Vaping Use  ? Vaping Use: Never used  ?Substance and Sexual Activity  ? Alcohol use: No  ?  Alcohol/week: 0.0 standard drinks  ? Drug use: No  ? Sexual activity: Not on file  ?Other Topics Concern  ? Not on file  ?Social History Narrative  ? Lives with alone  ? R handed  ? Caffeine: 1-2 C of coffee in the AM.  ? ?Social Determinants of Health  ? ?Financial Resource Strain: Not on file  ?Food Insecurity: Not on file  ?Transportation Needs: Not on file  ?Physical Activity: Not on file  ?Stress: Not on file  ?Social Connections: Not on file  ? ? ?Family History  ?Problem Relation Age of Onset  ? Hypertension Mother   ? Other Mother   ?     hx of hysterectomy at 10-56 for prolapsed uterus; hx of benign L arm cyst in her  74s  ? Stroke Father   ? Hypertension Father   ? Skin cancer Father 60  ?     NOS  ? Other Father   ?     enlarged prostate s/p surgery  ? Alzheimer's disease Maternal Aunt   ? Stomach cancer Paternal Aunt   ?     d. 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 Congestive Heart Failure Maternal Grandmother   ? Prostate cancer Maternal Grandfather   ?     dx. 75-76  ? Heart Problems Paternal Grandmother   ? Heart Problems Paternal Grandfather   ? Colon cancer Other   ?     maternal great aunt (MGM's sister) dx in  her 58s  ? Heart attack Paternal Uncle 62  ? Heart Problems Paternal Uncle   ? Angina Paternal Uncle   ? Breast cancer Cousin   ?     paternal 1st cousin d. 75  ? Breast cancer Cousin   ?     paternal 1st cousin dx. 95s  ? Colon polyps Neg Hx   ? Esophageal cancer Neg Hx   ? Rectal cancer Neg Hx   ? ? ?Past Medical History:  ?Diagnosis Date  ? Allergy   ? Angio-edema   ? Arthritis   ? Asthma   ? Carpal tunnel syndrome 07/07/2015  ? right  ? Chronic cough 12/02/2015  ? Chronic pain syndrome 08/10/2016  ? Ehlers-Danlos syndrome   ? Essential alopecia of women 04/14/2011  ? Family history of breast cancer in female 08/26/2015  ? (x2) paternal 1st cousins dx in their 29s   ? Fibrocystic breast changes of both breasts 07/19/2015  ? Fibromyalgia   ? HLD (hyperlipidemia)   ? Hypermobility syndrome 11/26/2014  ? Based on Fam Hx and Beighton Score of 7 this is likely ED III   ? Hypertrophy of vulva 07/20/2017  ? Inflammatory dermatosis 04/14/2011  ? Irritable larynx syndrome 12/02/2015  ? Lumbar radiculopathy 02/16/2016  ? Osteoarthritis 04/01/2017  ? Osteopenia   ? Primary arthrosis of first carpometacarpal joints, bilateral 06/01/2015  ? Sacroiliac joint dysfunction of right side 11/26/2014  ? B SI joint xrays last done at [facility] Dr. Kathlene November 03/13/2017  ? Skin cancer   ? ? ?Past Surgical History:  ?Procedure Laterality Date  ? BREAST EXCISIONAL BIOPSY Left 01/2019  ? atypical  lobular hyperplasia  ? CARPAL TUNNEL RELEASE 06-2020 Dr. Amedeo Plenty ,  rebuilding joints" release " Right 06/2020  ? COLONOSCOPY    ? DENTAL SURGERY    ? ELBOW FRACTURE SURGERY Left   ? as child  ? RADIOACTIVE SEED

## 2021-09-01 DIAGNOSIS — M5459 Other low back pain: Secondary | ICD-10-CM | POA: Diagnosis not present

## 2021-09-01 DIAGNOSIS — M6281 Muscle weakness (generalized): Secondary | ICD-10-CM | POA: Diagnosis not present

## 2021-09-01 DIAGNOSIS — Q7962 Hypermobile Ehlers-Danlos syndrome: Secondary | ICD-10-CM | POA: Diagnosis not present

## 2021-09-01 LAB — NARCOLEPSY EVALUATION
DQA1*01:02: POSITIVE
DQB1*06:02: NEGATIVE

## 2021-09-02 ENCOUNTER — Encounter: Payer: Self-pay | Admitting: Gastroenterology

## 2021-09-02 DIAGNOSIS — M797 Fibromyalgia: Secondary | ICD-10-CM | POA: Diagnosis not present

## 2021-09-02 DIAGNOSIS — G894 Chronic pain syndrome: Secondary | ICD-10-CM | POA: Diagnosis not present

## 2021-09-02 DIAGNOSIS — Q796 Ehlers-Danlos syndrome, unspecified: Secondary | ICD-10-CM | POA: Diagnosis not present

## 2021-09-02 DIAGNOSIS — J45909 Unspecified asthma, uncomplicated: Secondary | ICD-10-CM | POA: Diagnosis not present

## 2021-09-03 NOTE — Telephone Encounter (Signed)
She previously had good response to rifaximin.  If she would like another course of this, please place Rx for rifaximin 550 mg p.o. 3 times daily x14 days, RF 2.  Alternatively, depending on response to another course of rifaximin, we could start on Viberzi which is a medication designed for IBS-D.  This would be a daily medication for long-term use.

## 2021-09-08 ENCOUNTER — Telehealth: Payer: Self-pay | Admitting: Gastroenterology

## 2021-09-08 NOTE — Telephone Encounter (Signed)
Left voicemail for pt to call back to discuss FMLA paperwork.

## 2021-09-08 NOTE — Telephone Encounter (Signed)
Inbound call from [facility] at  [facility] about patients FMLA paperwork. Requesting a call back to discuss. Please advise.    [phone]  Ext: [phone]

## 2021-09-08 NOTE — Telephone Encounter (Signed)
Left voicemail for Lindsey [name] to call back.

## 2021-09-09 ENCOUNTER — Emergency Department (HOSPITAL_COMMUNITY)
Admission: EM | Admit: 2021-09-09 | Discharge: 2021-09-09 | Discharge status: Against Medical Advice | Payer: BC Managed Care – PPO | Attending: Emergency Medicine | Admitting: Emergency Medicine

## 2021-09-09 ENCOUNTER — Encounter (HOSPITAL_COMMUNITY): Payer: Self-pay | Admitting: Emergency Medicine

## 2021-09-09 ENCOUNTER — Other Ambulatory Visit: Payer: Self-pay

## 2021-09-09 ENCOUNTER — Telehealth: Payer: Self-pay | Admitting: Allergy

## 2021-09-09 DIAGNOSIS — R9431 Abnormal electrocardiogram [ECG] [EKG]: Secondary | ICD-10-CM | POA: Diagnosis not present

## 2021-09-09 DIAGNOSIS — Z5321 Procedure and treatment not carried out due to patient leaving prior to being seen by health care provider: Secondary | ICD-10-CM | POA: Diagnosis not present

## 2021-09-09 DIAGNOSIS — R531 Weakness: Secondary | ICD-10-CM | POA: Insufficient documentation

## 2021-09-09 DIAGNOSIS — R197 Diarrhea, unspecified: Secondary | ICD-10-CM | POA: Diagnosis not present

## 2021-09-09 DIAGNOSIS — R109 Unspecified abdominal pain: Secondary | ICD-10-CM | POA: Insufficient documentation

## 2021-09-09 LAB — CBC
HCT: 41.2 % (ref 36.0–46.0)
Hemoglobin: 14.3 g/dL (ref 12.0–15.0)
MCH: 32 pg (ref 26.0–34.0)
MCHC: 34.7 g/dL (ref 30.0–36.0)
MCV: 92.2 fL (ref 80.0–100.0)
Platelets: 300 10*3/uL (ref 150–400)
RBC: 4.47 MIL/uL (ref 3.87–5.11)
RDW: 13.6 % (ref 11.5–15.5)
WBC: 7.2 10*3/uL (ref 4.0–10.5)
nRBC: 0 % (ref 0.0–0.2)

## 2021-09-09 LAB — BASIC METABOLIC PANEL
Anion gap: 8 (ref 5–15)
BUN: 22 mg/dL — ABNORMAL HIGH (ref 6–20)
CO2: 18 mmol/L — ABNORMAL LOW (ref 22–32)
Calcium: 8.9 mg/dL (ref 8.9–10.3)
Chloride: 112 mmol/L — ABNORMAL HIGH (ref 98–111)
Creatinine, Ser: 1.11 mg/dL — ABNORMAL HIGH (ref 0.44–1.00)
GFR, Estimated: 59 mL/min — ABNORMAL LOW (ref >60)
Glucose, Bld: 111 mg/dL — ABNORMAL HIGH (ref 70–99)
Potassium: 3.7 mmol/L (ref 3.5–5.1)
Sodium: 138 mmol/L (ref 135–145)

## 2021-09-09 NOTE — Telephone Encounter (Signed)
Called patient - DOB verified - advised of provider notation below:  Her IgE is normal.   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  I have recommended that she see a hematologist/oncologist for this as it may be an issue with how her cells are produced in her bone marrow which an oncologist can assess.     Dr. Posey Pronto is an immunologist and infectious disease specialist that specializes in immunodeficiencies which I do not think is what is going on with Ms Solazzo.     If she would still like the referral for Dr Posey Pronto we can do it but I'm not sure how beneficial he would be.     Would she like for [place] to do a hematology/oncology referral?   Patient advised that she will be going to [facility] in August regarding the monoclonal elevation of her IgA. Patient stated if the provider didn't want to do the referral to Dr. Posey Pronto, she would get her primary to do it. Patient stated she's in the ER presently dealing with uncontrollable diarrhea.   Advised I would forward message advising no referral needs to be done at this time. Patient verbalized understanding, no further questions.

## 2021-09-09 NOTE — Telephone Encounter (Signed)
Spoke with pt and let her know that I had called her yesterday to discuss her FMLA paperwork. Asked pt what she does for work and what kind of restrictions she needed. Pt states she has to sit at a desk and type all day and her job won't restrict her hours. Pt stated that she can't type because of her carpal tunnel and Ehlers-Danlos diagnosis and her job has refused to accommodate her and buy what's necessary so she doesn't have to type. Let pt know that we would be filling out paperwork based on her IBS and asked her what restrictions she would need based on IBS. Pt stated it's IBS-D!! Pt then reported her IBS-D is caused by her immunodeficiency diagnosis that she received with [facility]. Pt states she sees Dr. Nelva Bush and has been referred to [facility] for Immunodeficiency. Pt then stated she told them that [facility] needed to complete paperwork but was told that GI would need to fill it out. Pt also mentioned something about PCP filling out paperwork as well. Asked pt if she wanted [place] to fill out what was faxed to [place] and pt stated yes. Asked pt what to put for return to work date and pt stated 09/23/21. Asked pt what physical/ mental and or cognitive restrictions that she would need for IBS-D. Pt stated she has diarrhea every 10 min and her job will not accommodate her. Pt also stated she has over 20 BMs per day and needs Dr. Bryan Lemma to prescribe her nutrients so she doesn't have to eat because she can't eat or drink anything. Asked pt what she meant by nutrients and pt stated she needs IV nutrition prescribed. Let pt know that if she can't eat or drink anything she would need to go to the ER. Pt started crying and stated they won't help me! They'll just give me fluids and send me home. Pt stated that no one will help her. Pt also stated she is seeing [facility] as well. Let pt know that she would need to go to the ER to be assessed if she can't eat or drink. Pt stated that she will  have to call an ambulance because she cant drive herself because she is too weak. Pt also stated she has taken 4 lomotil already today and she's only supposed to take 4 in 24 hours and none of the prescription medications that have been prescribed have helped.

## 2021-09-09 NOTE — Telephone Encounter (Signed)
Thank you for the update.  Please let her know that we do not do parenteral nutrition for IBS-D.  Additionally, it sounds like her carpal tunnel and Ehlers-Danlos is the most problematic in terms of work limitations, and I agree that [employer] paperwork for those issues can be filled out by either her PCM or a Rheumatologist.  When it comes to her IBS-D and FMLA, I can make a request on her behalf for liberal bathroom privileges, potentially work from home accommodation if that applies to her occupation, and explanation for potential work outage during symptom flares, but otherwise not sure what additional request can be made.  With that said, none of that would assist in what seems like her equally problematic issue of typing and desk posture/positioning.

## 2021-09-09 NOTE — Telephone Encounter (Signed)
Patient called and said that she would like for you to referal her to DR.NIRAJ PATEL on [address]. [phone] for [place]. // [phone].

## 2021-09-09 NOTE — Telephone Encounter (Signed)
Mychart message sent to patient.

## 2021-09-09 NOTE — ED Notes (Signed)
Pt eloped. Pt did not wait to staff to speak to her.

## 2021-09-09 NOTE — ED Provider Triage Note (Signed)
Emergency Medicine Provider Triage Evaluation Note  Lindsey [name] , a 56 y.o. female  was evaluated in triage.  Pt complains of diarrhea, weakness, abdominal pain.  Patient states she has a history of IBS-D there is diagnosed roughly a year and a half ago.  She has had persistent diarrhea since with some control taking Lindsey [name] at home.  Since May 15, Lindsey [name] has not been helpful in controlling her diarrheal symptoms.  She is seen multiple specialists including GI doctors, immunologist concerning for chronic symptoms.  She has an appointment with an immunologist from [facility] tomorrow and today is requesting a fluid bolus.  She states this episode is no different in severity from prior ones.  She denies fever, chills, night sweats, chest pain, vomiting, nausea, urinary/vaginal symptoms, melena, hematochezia.  Review of Systems  Positive: See above Negative:   Physical Exam  BP 119/81 (BP Location: Left Arm)   Pulse 86   Temp 98 F (36.7 C) (Oral)   Resp 18   LMP 05/09/2014   SpO2 96%  Gen:   Awake, no distress   Resp:  Normal effort  MSK:   Moves extremities without difficulty  Other:  Generalized abdominal pain.  Medical Decision Making  Medically screening exam initiated at 4:51 PM.  Appropriate orders placed.  Lindsey [name] was informed that the remainder of the evaluation will be completed by another provider, this initial triage assessment does not replace that evaluation, and the importance of remaining in the ED until their evaluation is complete.     Lindsey [name], [place] 09/09/21 1655

## 2021-09-09 NOTE — Telephone Encounter (Signed)
Patient returned your call also inquired about having to speak with a nurse regarding symptoms but she requested Nab[place] I advised her that you would be able to assist with that as well as Dr. Vivia Ewing nurse she sounded like she got annoyed and just requested the call back.

## 2021-09-09 NOTE — ED Triage Notes (Signed)
Pt c/o diarrhea that has been going on x 1 year. Pt reports hx of IBS and reports she is now weak and wants and EKG to make sure her heart is ok.

## 2021-09-09 NOTE — ED Notes (Signed)
Pt turned in her labels and sts she is going home

## 2021-09-10 DIAGNOSIS — D801 Nonfamilial hypogammaglobulinemia: Secondary | ICD-10-CM | POA: Insufficient documentation

## 2021-09-10 DIAGNOSIS — D472 Monoclonal gammopathy: Secondary | ICD-10-CM | POA: Diagnosis not present

## 2021-09-13 ENCOUNTER — Other Ambulatory Visit: Payer: Self-pay | Admitting: Gastroenterology

## 2021-09-14 DIAGNOSIS — K909 Intestinal malabsorption, unspecified: Secondary | ICD-10-CM | POA: Diagnosis not present

## 2021-09-14 DIAGNOSIS — R5381 Other malaise: Secondary | ICD-10-CM | POA: Diagnosis not present

## 2021-09-14 DIAGNOSIS — R197 Diarrhea, unspecified: Secondary | ICD-10-CM | POA: Diagnosis not present

## 2021-09-15 DIAGNOSIS — G5603 Carpal tunnel syndrome, bilateral upper limbs: Secondary | ICD-10-CM | POA: Diagnosis not present

## 2021-09-15 DIAGNOSIS — M1811 Unilateral primary osteoarthritis of first carpometacarpal joint, right hand: Secondary | ICD-10-CM | POA: Diagnosis not present

## 2021-09-15 DIAGNOSIS — M129 Arthropathy, unspecified: Secondary | ICD-10-CM | POA: Diagnosis not present

## 2021-09-15 DIAGNOSIS — M79641 Pain in right hand: Secondary | ICD-10-CM | POA: Diagnosis not present

## 2021-09-15 DIAGNOSIS — Z79899 Other long term (current) drug therapy: Secondary | ICD-10-CM | POA: Diagnosis not present

## 2021-09-16 DIAGNOSIS — Q7962 Hypermobile Ehlers-Danlos syndrome: Secondary | ICD-10-CM | POA: Diagnosis not present

## 2021-09-16 DIAGNOSIS — M5459 Other low back pain: Secondary | ICD-10-CM | POA: Diagnosis not present

## 2021-09-16 DIAGNOSIS — M6281 Muscle weakness (generalized): Secondary | ICD-10-CM | POA: Diagnosis not present

## 2021-09-17 ENCOUNTER — Other Ambulatory Visit: Payer: Self-pay | Admitting: Gastroenterology

## 2021-09-21 ENCOUNTER — Encounter: Payer: Self-pay | Admitting: Neurology

## 2021-09-21 ENCOUNTER — Other Ambulatory Visit: Payer: Self-pay | Admitting: Neurology

## 2021-09-21 ENCOUNTER — Telehealth: Payer: Self-pay | Admitting: Neurology

## 2021-09-21 NOTE — Telephone Encounter (Signed)
Called the patient to review the lab results with her from the genetic testing that was completed. Advised that there was 1 strand that tested positive. Informed the patient that in order to truly diagnose narcolepsy there are certain medications that can invalidate the test. Advised that we would have to go through her list and determine what medications the test.  Advised the patient that likely would have to come off of certain medications for 2 to 3 weeks.  This would provide a more valid test and if diagnostic of narcolepsy would allow for coverage of medications. Alternatively we can complete the test while taking the medication and still her daytime sleepiness.  If anything it may show idiopathic hypersomnia.  Advised that we are limited on what treatments are available but it does provide some coverage for medications depending on insurance.  Patient would like to proceed with trying to do the MSLT in assess for narcolepsy.  Patient will send a updated copy of her medication list so that we may review and determine what medications would need to be held and for how long.  Once we determined that I will advised the patient and she will discuss with the ordering physician to determine a plan on weaning off if possible.

## 2021-09-21 NOTE — Telephone Encounter (Signed)
-----  Message from Lindsey Seat, MD sent at 09/20/2021  1:54 PM EDT ----- OOps, yes that's a problem. We cant get to the narcolepsy diagnosis on meds, only hypersomnia may still be diagnosed by MSLT, idiopathic hypersomnia diagnosis could give her access to medications.  Lets go for the work up of idiopathic hypersomnia.  ----- Message ----- From: Lindsey Crocker, RN Sent: 09/20/2021  10:13 AM EDT To: Lindsey Seat, MD  This is one I held in your basket cause I wanted to talk about this one. Per your notes you stated, "My Plan is to proceed with: 1) no evaluation of narcolepsy by sleep test. There are no frequent vivid dreams, hypnopompic hallucinations, sleep paralysis.  I can't offer an MSLT , obviously due to medication.  There is no other test to evaluate for pathological hypersomnia."  Do you want me to review the test and tell her that we can evaluate further with mslt (on meds since she cant come off meds but explain likely may invalidate the narcolepsy diagnosis?) ----- Message ----- From: Lindsey Seat, MD Sent: 09/20/2021   9:10 AM EDT To: Lindsey [name] 1 Results  Single HLA positive test result. Will need MSLT to follow.

## 2021-09-22 DIAGNOSIS — M5459 Other low back pain: Secondary | ICD-10-CM | POA: Diagnosis not present

## 2021-09-22 DIAGNOSIS — D801 Nonfamilial hypogammaglobulinemia: Secondary | ICD-10-CM | POA: Diagnosis not present

## 2021-09-22 DIAGNOSIS — Q7962 Hypermobile Ehlers-Danlos syndrome: Secondary | ICD-10-CM | POA: Diagnosis not present

## 2021-09-22 DIAGNOSIS — D472 Monoclonal gammopathy: Secondary | ICD-10-CM | POA: Diagnosis not present

## 2021-09-22 DIAGNOSIS — M6281 Muscle weakness (generalized): Secondary | ICD-10-CM | POA: Diagnosis not present

## 2021-09-24 DIAGNOSIS — G47419 Narcolepsy without cataplexy: Secondary | ICD-10-CM | POA: Diagnosis not present

## 2021-09-24 DIAGNOSIS — K580 Irritable bowel syndrome with diarrhea: Secondary | ICD-10-CM | POA: Diagnosis not present

## 2021-09-24 DIAGNOSIS — D801 Nonfamilial hypogammaglobulinemia: Secondary | ICD-10-CM | POA: Diagnosis not present

## 2021-09-24 DIAGNOSIS — N028 Recurrent and persistent hematuria with other morphologic changes: Secondary | ICD-10-CM | POA: Diagnosis not present

## 2021-10-05 ENCOUNTER — Other Ambulatory Visit: Payer: Self-pay

## 2021-10-05 MED ORDER — EPINEPHRINE 0.3 MG/0.3ML IJ SOAJ: 0.3000 mg | INTRAMUSCULAR | 2 refills | Status: DC | PRN

## 2021-10-14 DIAGNOSIS — N028 Recurrent and persistent hematuria with other morphologic changes: Secondary | ICD-10-CM | POA: Diagnosis not present

## 2021-10-14 DIAGNOSIS — R197 Diarrhea, unspecified: Secondary | ICD-10-CM | POA: Diagnosis not present

## 2021-10-14 DIAGNOSIS — K580 Irritable bowel syndrome with diarrhea: Secondary | ICD-10-CM | POA: Diagnosis not present

## 2021-10-14 DIAGNOSIS — Q796 Ehlers-Danlos syndrome, unspecified: Secondary | ICD-10-CM | POA: Diagnosis not present

## 2021-10-15 ENCOUNTER — Other Ambulatory Visit: Payer: Self-pay | Admitting: Gastroenterology

## 2021-10-18 ENCOUNTER — Other Ambulatory Visit: Payer: Self-pay | Admitting: Gastroenterology

## 2021-10-20 DIAGNOSIS — D472 Monoclonal gammopathy: Secondary | ICD-10-CM | POA: Diagnosis not present

## 2021-10-25 DIAGNOSIS — D472 Monoclonal gammopathy: Secondary | ICD-10-CM | POA: Insufficient documentation

## 2021-10-25 DIAGNOSIS — D479 Neoplasm of uncertain behavior of lymphoid, hematopoietic and related tissue, unspecified: Secondary | ICD-10-CM | POA: Diagnosis not present

## 2021-11-02 DIAGNOSIS — D801 Nonfamilial hypogammaglobulinemia: Secondary | ICD-10-CM | POA: Diagnosis not present

## 2021-11-11 DIAGNOSIS — D472 Monoclonal gammopathy: Secondary | ICD-10-CM | POA: Diagnosis not present

## 2021-11-15 ENCOUNTER — Other Ambulatory Visit: Payer: Self-pay | Admitting: General Surgery

## 2021-11-15 DIAGNOSIS — N951 Menopausal and female climacteric states: Secondary | ICD-10-CM | POA: Diagnosis not present

## 2021-11-15 DIAGNOSIS — D472 Monoclonal gammopathy: Secondary | ICD-10-CM | POA: Diagnosis not present

## 2021-11-15 DIAGNOSIS — R5383 Other fatigue: Secondary | ICD-10-CM | POA: Diagnosis not present

## 2021-11-15 DIAGNOSIS — D0502 Lobular carcinoma in situ of left breast: Secondary | ICD-10-CM

## 2021-11-15 DIAGNOSIS — R6882 Decreased libido: Secondary | ICD-10-CM | POA: Diagnosis not present

## 2021-11-15 DIAGNOSIS — R897 Abnormal histological findings in specimens from other organs, systems and tissues: Secondary | ICD-10-CM | POA: Diagnosis not present

## 2021-11-22 DIAGNOSIS — N814 Uterovaginal prolapse, unspecified: Secondary | ICD-10-CM | POA: Diagnosis not present

## 2021-11-22 DIAGNOSIS — N906 Unspecified hypertrophy of vulva: Secondary | ICD-10-CM | POA: Diagnosis not present

## 2021-11-22 DIAGNOSIS — N816 Rectocele: Secondary | ICD-10-CM | POA: Diagnosis not present

## 2021-11-25 DIAGNOSIS — C9000 Multiple myeloma not having achieved remission: Secondary | ICD-10-CM | POA: Diagnosis not present

## 2021-11-25 DIAGNOSIS — K580 Irritable bowel syndrome with diarrhea: Secondary | ICD-10-CM | POA: Diagnosis not present

## 2021-11-25 DIAGNOSIS — G90A Postural orthostatic tachycardia syndrome (POTS): Secondary | ICD-10-CM | POA: Insufficient documentation

## 2021-11-25 DIAGNOSIS — R55 Syncope and collapse: Secondary | ICD-10-CM | POA: Insufficient documentation

## 2021-11-29 ENCOUNTER — Other Ambulatory Visit: Payer: Self-pay

## 2021-11-29 ENCOUNTER — Encounter (HOSPITAL_BASED_OUTPATIENT_CLINIC_OR_DEPARTMENT_OTHER): Payer: Self-pay | Admitting: Emergency Medicine

## 2021-11-29 ENCOUNTER — Emergency Department (HOSPITAL_BASED_OUTPATIENT_CLINIC_OR_DEPARTMENT_OTHER)
Admission: EM | Admit: 2021-11-29 | Discharge: 2021-11-29 | Disposition: A | Discharge status: Home / Self Care | Payer: BC Managed Care – PPO | Attending: Emergency Medicine | Admitting: Emergency Medicine

## 2021-11-29 DIAGNOSIS — G894 Chronic pain syndrome: Secondary | ICD-10-CM | POA: Diagnosis not present

## 2021-11-29 DIAGNOSIS — G8929 Other chronic pain: Secondary | ICD-10-CM | POA: Insufficient documentation

## 2021-11-29 DIAGNOSIS — M7918 Myalgia, other site: Secondary | ICD-10-CM | POA: Diagnosis not present

## 2021-11-29 DIAGNOSIS — Z8579 Personal history of other malignant neoplasms of lymphoid, hematopoietic and related tissues: Secondary | ICD-10-CM | POA: Diagnosis not present

## 2021-11-29 MED ORDER — KETOROLAC TROMETHAMINE 15 MG/ML IJ SOLN
30.0000 mg | Freq: Once | INTRAMUSCULAR | Status: AC
  Administered 2021-11-29: 30 mg via INTRAMUSCULAR
  Filled 2021-11-29: qty 2

## 2021-11-29 NOTE — ED Provider Notes (Signed)
[name] EMERGENCY DEPARTMENT Provider Note   CSN: [id number] Arrival date & time: 11/29/21  1319     History  Chief Complaint  Patient presents with   Pain    Lindsey [name] is a 56 y.o. female with a past medical history of fibromyalgia, Ehlers-Danlos and reported multiple myeloma presenting today with pain.  She says that she has had pain for 30 years but notes unbearable.  Has been worsening over the past 10 years intolerable for 2 years.  She says "the only thing they have given the meds ever helped is Dilaudid."  She reports that she has been unable to get in with multiple pain clinics and that her PCP will not prescribe the medication either.  Says that she follows with [facility] for multiple myeloma but that they never treat her pain.  Describes the pain as generalized.  HPI     Home Medications Prior to Admission medications   Medication Sig Start Date End Date Taking? Authorizing Provider  Acetaminophen (TYLENOL 8 HOUR ARTHRITIS PAIN PO) Take 1,300 mg by mouth in the morning and at bedtime.    [provider]  Albuterol Sulfate (PROAIR RESPICLICK) 628 (90 Base) MCG/ACT AEPB Inhale into the lungs as needed.    [provider]  Ascorbic Acid (VITAMIN C PO) Take 1,200 mg by mouth 2 (two) times daily.     [provider]  CHITOSAN PO Take 1,000 mg by mouth in the morning and at bedtime.    [provider]  Cholecalciferol (VITAMIN D3) 1.25 MG (50000 UT) TABS Take 1 tablet by mouth once a week.    [provider]  clonazePAM (KLONOPIN) 0.5 MG tablet Take 1 tablet (0.5 mg total) by mouth at bedtime. 03/15/21   Dohmeier, Asencion Partridge, MD  cycloSPORINE (RESTASIS) 0.05 % ophthalmic emulsion Place 1 drop into both eyes 2 (two) times daily.    [provider]  dicyclomine (BENTYL) 20 MG tablet TAKE 1 TABLET BY MOUTH TWICE A DAY 09/13/21   Cirigliano, Vito V, DO  diphenoxylate-atropine (LOMOTIL) 2.5-0.025 MG tablet TAKE 1 TABLET BY  MOUTH 4 TIMES A DAY AS NEEDED FOR DIARRHEA 09/20/21   Cirigliano, Vito V, DO  EPINEPHrine 0.3 mg/0.3 mL IJ SOAJ injection Inject 0.3 mg into the muscle as needed for anaphylaxis. 10/05/21   Kennith Gain, MD  estradiol (VIVELLE-DOT) 0.075 MG/24HR Place onto the skin every 3 (three) days. 12/20/20   [provider]  FERROUS BISGLYCINATE CHELATE PO Take 18 mg by mouth daily.    [provider]  fexofenadine (ALLEGRA) 180 MG tablet Take 1 tablet (180 mg total) by mouth daily. 08/12/21   Kennith Gain, MD  folic acid (FOLVITE) 366 MCG tablet Take 800 mcg by mouth daily.    [provider]  gabapentin (NEURONTIN) 300 MG capsule Take 300 mg by mouth at bedtime. 04/17/14   [provider]  hyoscyamine (LEVBID) 0.375 MG 12 hr tablet Take 0.375 mg by mouth 2 (two) times daily.    [provider]  levocetirizine (XYZAL) 5 MG tablet Take 1 tablet (5 mg total) by mouth at bedtime. 08/12/21   Kennith Gain, MD  MAGNESIUM MALATE PO Take 450 mg by mouth in the morning and at bedtime.    [provider]  MELATONIN PO Take 30 mg by mouth at bedtime.    [provider]  Menaquinone-7 (VITAMIN K2 PO) Take 150 mcg by mouth every morning.    [provider]  montelukast (SINGULAIR) 10 MG tablet Take 1 tablet (10 mg total) by mouth at bedtime. 08/12/21   Kennith Gain, MD  mupirocin ointment (BACTROBAN) 2 % Apply 1 application topically 3 (three) times daily as needed.    [provider]  NALTREXONE HCL PO Take 6 mg by mouth at bedtime.    [provider]  OVER THE COUNTER MEDICATION 306 mg every morning. Estrogen control    [provider]  OVER THE COUNTER MEDICATION 2 capsules every morning. MTHFR Endure (RIH)    [provider]  PHOSPHATIDYLSERINE PO 200 mg every morning.    [provider]  Pregnenolone Micronized (PREGNENOLONE PO) Take 75 mg by mouth 3 (three)  times a week.    [provider]  progesterone (PROMETRIUM) 200 MG capsule progesterone micronized 200 mg capsule  TAKE 3 CAPSULES EVERY NIGHT    [provider]  promethazine (PHENERGAN) 25 MG tablet Take 1 tablet (25 mg total) by mouth every 6 (six) hours as needed for nausea or vomiting. 06/08/21   Regan Lemming, MD  sertraline (ZOLOFT) 25 MG tablet TAKE 1 TABLET BY MOUTH EVERY DAY 11/02/20   Dohmeier, Asencion Partridge, MD  Testosterone 75 MG PLLT 75 mg every 3 (three) months. Insert 1 pellet every 3 months    [provider]  traZODone (DESYREL) 100 MG tablet trazodone 100 mg tablet  TAKE 1-2 TABLET BY MOUTH AT BEDTIME    [provider]  XIFAXAN 550 MG TABS tablet Take 550 mg by mouth 3 (three) times daily. 09/15/21   [provider]      Allergies    Poison oak extract [poison oak extract], Xiidra [lifitegrast], Penicillins, Sulfa antibiotics, and Sulfamethoxazole-trimethoprim    Review of Systems   Review of Systems  Physical Exam Updated Vital Signs BP 121/83 (BP Location: Left Arm)   Pulse 87   Temp 98.9 F (37.2 C) (Oral)   Resp 16   Ht 5' 6"  (1.676 m)   Wt 65.8 kg   LMP 04/08/2014   SpO2 100%   BMI 23.40 kg/m  Physical Exam Vitals and nursing note reviewed.  Constitutional:      Appearance: Normal appearance.  HENT:     Head: Normocephalic and atraumatic.  Eyes:     General: No scleral icterus.    Conjunctiva/sclera: Conjunctivae normal.  Pulmonary:     Effort: Pulmonary effort is normal. No respiratory distress.  Skin:    Findings: No rash.  Neurological:     Mental Status: She is alert.  Psychiatric:        Mood and Affect: Mood normal.     ED Results / Procedures / Treatments   Labs (all labs ordered are listed, but only abnormal results are displayed) Labs Reviewed - No data to display  EKG None  Radiology No results found.  Procedures Procedures   Medications Ordered in ED Medications  ketorolac  (TORADOL) 15 MG/ML injection 30 mg (has no administration in time range)    ED Course/ Medical Decision Making/ A&P                           Medical Decision Making Risk Prescription drug management.   56 year old female presenting today with pain.  Reports has been going on for 30 years.  Reports nobody is helping her.  Per chart review, patient does follow with [facility] for multiple myeloma.  She is also been diagnosed with fibromyalgia and chronic pain.  She was originally treated with tramadol and gabapentin but she tells me this never worked.  She says oxycodone does not work either which she told these doctors as well.  Physical exam: Patient resting comfortably with normal work of breathing.  Treatment: Given Toradol shot. Originally considered Dilaudid subcu however patient drove to the ED today.  No opioids will be given  MDM/Disposition: 56 year old female with chronic pain.  She will not receive opioid pain medications in the department or outpatient.  She needs to see a specialist for this.  She is not pleased with this answer however it is inappropriate for me to treat chronic pain in the emergency department.  She then began to ask me for ultrasound imaging and lab work before she can get surgery with urogyn.  This is not an emergent condition either.  She will need to pursue this outpatient.  Given Toradol and will be discharged home.  Ambulatory and hemodynamically stable.  Final Clinical Impression(s) / ED Diagnoses Final diagnoses:  Other chronic pain    Rx / DC Orders ED Discharge Orders     None      Results and diagnoses were explained to the patient. Return precautions discussed in full. Patient had no additional questions and expressed complete understanding.   This chart was dictated using voice recognition software.  Despite best efforts to proofread,  errors can occur which can change the documentation meaning.    Darliss Ridgel 11/29/21  1559    Tegeler, Gwenyth Allegra, MD 11/29/21 [phone]

## 2021-11-29 NOTE — ED Triage Notes (Signed)
Pt arrives pov ambulatory with steady gait c/o a mix of dull and sharp chronic generalized pain. Reports decrease in function at home and difficulty taking care of herself. Reports attempting to establish pain management care for the past 4 months, but unsuccessful.

## 2021-11-29 NOTE — Discharge Instructions (Addendum)
Continue to try and get established with different pain clinics.  The emergency department is not going to treat your long-term pain with opioid medications.  You may also speak to a PCP about this.  I have attached a [place] office for you to call to get established.  Please come back with any severe chest pain, inability to breathing, localized abdominal pain or any other emergent conditions.  Read the information about chronic pain attached to these discharge papers.  We hope that you feel better.

## 2021-11-30 DIAGNOSIS — M9902 Segmental and somatic dysfunction of thoracic region: Secondary | ICD-10-CM | POA: Diagnosis not present

## 2021-11-30 DIAGNOSIS — M9901 Segmental and somatic dysfunction of cervical region: Secondary | ICD-10-CM | POA: Diagnosis not present

## 2021-11-30 DIAGNOSIS — M9903 Segmental and somatic dysfunction of lumbar region: Secondary | ICD-10-CM | POA: Diagnosis not present

## 2021-12-01 ENCOUNTER — Telehealth: Payer: Self-pay

## 2021-12-01 DIAGNOSIS — I951 Orthostatic hypotension: Secondary | ICD-10-CM | POA: Diagnosis not present

## 2021-12-01 DIAGNOSIS — C9000 Multiple myeloma not having achieved remission: Secondary | ICD-10-CM | POA: Diagnosis not present

## 2021-12-01 DIAGNOSIS — R5382 Chronic fatigue, unspecified: Secondary | ICD-10-CM | POA: Diagnosis not present

## 2021-12-01 DIAGNOSIS — R55 Syncope and collapse: Secondary | ICD-10-CM | POA: Diagnosis not present

## 2021-12-01 DIAGNOSIS — R42 Dizziness and giddiness: Secondary | ICD-10-CM | POA: Diagnosis not present

## 2021-12-01 NOTE — Telephone Encounter (Signed)
NOTES SCANNED TO REFERRAL 

## 2021-12-02 DIAGNOSIS — D839 Common variable immunodeficiency, unspecified: Secondary | ICD-10-CM | POA: Diagnosis not present

## 2021-12-02 DIAGNOSIS — M9902 Segmental and somatic dysfunction of thoracic region: Secondary | ICD-10-CM | POA: Diagnosis not present

## 2021-12-02 DIAGNOSIS — D472 Monoclonal gammopathy: Secondary | ICD-10-CM | POA: Diagnosis not present

## 2021-12-02 DIAGNOSIS — K529 Noninfective gastroenteritis and colitis, unspecified: Secondary | ICD-10-CM | POA: Diagnosis not present

## 2021-12-02 DIAGNOSIS — M5387 Other specified dorsopathies, lumbosacral region: Secondary | ICD-10-CM | POA: Diagnosis not present

## 2021-12-02 DIAGNOSIS — M9907 Segmental and somatic dysfunction of upper extremity: Secondary | ICD-10-CM | POA: Diagnosis not present

## 2021-12-02 DIAGNOSIS — M9903 Segmental and somatic dysfunction of lumbar region: Secondary | ICD-10-CM | POA: Diagnosis not present

## 2021-12-02 DIAGNOSIS — J4530 Mild persistent asthma, uncomplicated: Secondary | ICD-10-CM | POA: Diagnosis not present

## 2021-12-02 DIAGNOSIS — M9901 Segmental and somatic dysfunction of cervical region: Secondary | ICD-10-CM | POA: Diagnosis not present

## 2021-12-02 DIAGNOSIS — M5383 Other specified dorsopathies, cervicothoracic region: Secondary | ICD-10-CM | POA: Diagnosis not present

## 2021-12-06 ENCOUNTER — Ambulatory Visit
Admission: RE | Admit: 2021-12-06 | Discharge: 2021-12-06 | Disposition: A | Discharge status: Home / Self Care | Payer: BC Managed Care – PPO | Source: Ambulatory Visit | Attending: General Surgery | Admitting: General Surgery

## 2021-12-06 DIAGNOSIS — D0502 Lobular carcinoma in situ of left breast: Secondary | ICD-10-CM

## 2021-12-06 DIAGNOSIS — R922 Inconclusive mammogram: Secondary | ICD-10-CM | POA: Diagnosis not present

## 2021-12-07 DIAGNOSIS — M6281 Muscle weakness (generalized): Secondary | ICD-10-CM | POA: Diagnosis not present

## 2021-12-07 DIAGNOSIS — M5451 Vertebrogenic low back pain: Secondary | ICD-10-CM | POA: Diagnosis not present

## 2021-12-07 DIAGNOSIS — R2681 Unsteadiness on feet: Secondary | ICD-10-CM | POA: Diagnosis not present

## 2021-12-08 DIAGNOSIS — R0609 Other forms of dyspnea: Secondary | ICD-10-CM | POA: Diagnosis not present

## 2021-12-08 DIAGNOSIS — D472 Monoclonal gammopathy: Secondary | ICD-10-CM | POA: Diagnosis not present

## 2021-12-08 DIAGNOSIS — R197 Diarrhea, unspecified: Secondary | ICD-10-CM | POA: Diagnosis not present

## 2021-12-20 DIAGNOSIS — Z1151 Encounter for screening for human papillomavirus (HPV): Secondary | ICD-10-CM | POA: Diagnosis not present

## 2021-12-20 DIAGNOSIS — Z124 Encounter for screening for malignant neoplasm of cervix: Secondary | ICD-10-CM | POA: Diagnosis not present

## 2021-12-20 DIAGNOSIS — Z01419 Encounter for gynecological examination (general) (routine) without abnormal findings: Secondary | ICD-10-CM | POA: Diagnosis not present

## 2021-12-20 DIAGNOSIS — Z6823 Body mass index (BMI) 23.0-23.9, adult: Secondary | ICD-10-CM | POA: Diagnosis not present

## 2021-12-21 ENCOUNTER — Encounter: Payer: Self-pay | Admitting: Cardiology

## 2021-12-21 ENCOUNTER — Ambulatory Visit: Payer: BC Managed Care – PPO | Attending: Cardiology | Admitting: Cardiology

## 2021-12-21 ENCOUNTER — Ambulatory Visit (INDEPENDENT_AMBULATORY_CARE_PROVIDER_SITE_OTHER): Payer: BC Managed Care – PPO

## 2021-12-21 VITALS — BP 104/70 | HR 83 | Ht 66.0 in | Wt 149.6 lb

## 2021-12-21 DIAGNOSIS — R079 Chest pain, unspecified: Secondary | ICD-10-CM | POA: Diagnosis not present

## 2021-12-21 DIAGNOSIS — C9000 Multiple myeloma not having achieved remission: Secondary | ICD-10-CM

## 2021-12-21 DIAGNOSIS — G894 Chronic pain syndrome: Secondary | ICD-10-CM | POA: Diagnosis not present

## 2021-12-21 DIAGNOSIS — R55 Syncope and collapse: Secondary | ICD-10-CM | POA: Diagnosis not present

## 2021-12-21 NOTE — Progress Notes (Unsigned)
Enrolled for [employer] to mail a ZIO XT long term holter monitor to the patients address on file.  

## 2021-12-21 NOTE — Patient Instructions (Signed)
Medication Instructions:  Your physician recommends that you continue on your current medications as directed. Please refer to the Current Medication list given to you today.  *If you need a refill on your cardiac medications before your next appointment, please call your pharmacy*  Testing/Procedures: Your physician has requested that you have an echocardiogram. Echocardiography is a painless test that uses sound waves to create images of your heart. It provides your doctor with information about the size and shape of your heart and how well your heart's chambers and valves are working. This procedure takes approximately one hour. There are no restrictions for this procedure.  Stress Cardiac MRI-we will call to schedule this at [place] physician has requested you wear a ZIO patch monitor for _14_ days.  This is a single patch monitor.   [employer] supplies one patch monitor per enrollment. Additional stickers are not available. Please do not apply patch if you will be having a Nuclear Stress Test, Echocardiogram, Cardiac CT, MRI, or Chest Xray during the period you would be wearing the monitor. The patch cannot be worn during these tests. You cannot remove and re-apply the ZIO XT patch monitor.  Your ZIO patch monitor will be sent Fed Ex from [employer] directly to your home address. It may take 3-5 days to receive your monitor after you have been enrolled.  Once you have received your monitor, please review the enclosed instructions. Your monitor has already been registered assigning a specific monitor serial # to you.  Billing and Patient Assistance Program Information   We have supplied [employer] with any of your insurance information on file for billing purposes. [employer] offers a sliding scale Patient Assistance Program for patients that do not have insurance, or whose insurance does not completely cover the cost of the ZIO  monitor.   You must apply for the Patient Assistance Program to qualify for this discounted rate.     To apply, please call [employer] at [phone], select option 4, then select option 2, and ask to apply for Patient Assistance Program.  Theodore Demark will ask your household income, and how many people are in your household.  They will quote your out-of-pocket cost based on that information.  [employer] will also be able to set up a 5-month interest-free payment plan if needed.  Applying the monitor   Shave hair from upper left chest.  Hold abrader disc by orange tab. Rub abrader in 40 strokes over the upper left chest as indicated in your monitor instructions.  Clean area with 4 enclosed alcohol pads. Let dry.  Apply patch as indicated in monitor instructions. Patch will be placed under collarbone on left side of chest with arrow pointing upward.  Rub patch adhesive wings for 2 minutes. Remove white label marked "1". Remove the white label marked "2". Rub patch adhesive wings for 2 additional minutes.  While looking in a mirror, press and release button in center of patch. A small green light will flash 3-4 times. This will be your only indicator that the monitor has been turned on. ?  Do not shower for the first 24 hours. You may shower after the first 24 hours.  Press the button if you feel a symptom. You will hear a small click. Record Date, Time and Symptom in the Patient Logbook.  When you are ready to remove the patch, follow instructions on the last 2 pages of the Patient Logbook. Stick patch  monitor onto the last page of Patient Logbook.  Place Patient Logbook in the blue and white box.  Use locking tab on box and tape box closed securely.  The blue and white box has prepaid postage on it. Please place it in the mailbox as soon as possible. Your physician should have your test results approximately 7 days after the monitor has been mailed back to [facility].  Call [place] at  [phone] if you have questions regarding your ZIO XT patch monitor. Call them immediately if you see an orange light blinking on your monitor.  If your monitor falls off in less than 4 days, contact our Monitor department at [phone]. ?If your monitor becomes loose or falls off after 4 days call [employer] at [phone] for suggestions on securing your monitor.?  Follow-Up: At [facility], you and your health needs are our priority.  As part of our continuing mission to provide you with exceptional heart care, we have created designated Provider Care Teams.  These Care Teams include your primary Cardiologist (physician) and Advanced Practice Providers (APPs -  Physician Assistants and Nurse Practitioners) who all work together to provide you with the care you need, when you need it.  We recommend signing up for the patient portal called "MyChart".  Sign up information is provided on this After Visit Summary.  MyChart is used to connect with patients for Virtual Visits (Telemedicine).  Patients are able to view lab/test results, encounter notes, upcoming appointments, etc.  Non-urgent messages can be sent to your provider as well.   To learn more about what you can do with MyChart, go to [email or web address].    Your next appointment:   4 month(s)  The format for your next appointment:   In Person  Provider:   Dr. Gardiner Rhyme

## 2021-12-21 NOTE — Progress Notes (Signed)
Cardiology Office Note:    Date:  12/23/2021   ID:  Lindsey [name], DOB [birth year], MRN [id number]  PCP:  [facility] @ [place]  Cardiologist:  Mertie Moores, MD  Electrophysiologist:  None   Referring MD: Faustino Congress, NP   Chief Complaint  Patient presents with   Shortness of Breath   Chest Pain    History of Present Illness:    Lindsey [name] is a 56 y.o. female with a hx of MGUS/smoldering myeloma, asthma, Ehlers-Danlos syndrome, fibromyalgia who is referred by Faustino Congress, NP for evaluation of dizziness.  Reports multiple passing out spells, has been happening every day.  When she passes out she will be unconscious for anywhere from 1 to 5 hours.  Can occur with changing positions but also when she is just sitting.  She is seeing neurologist and being tested for narcolepsy.  She reports has been having uncontrolled diarrhea.  Having trouble keeping up with her fluids.  Also reports having chest pain and shortness of breath.  Also having P\pain on left side of chest, can last for about an hour.  Recently had episode on walking to her mailbox.  Also feels short of breath all the time.  Having palpitations daily where feels like heart is racing.  Denies any lower extremity edema.  No smoking history.  No family history of heart disease.   Past Medical History:  Diagnosis Date   Allergy    Angio-edema    Arthritis    Asthma    Carpal tunnel syndrome 07/07/2015   right   Chronic cough 12/02/2015   Chronic pain syndrome 08/10/2016   Ehlers-Danlos syndrome    Essential alopecia of women 04/14/2011   Family history of breast cancer in female 08/26/2015   (x2) paternal 1st cousins dx in their 86s    Fibrocystic breast changes of both breasts 07/19/2015   Fibromyalgia    HLD (hyperlipidemia)    Hypermobility syndrome 11/26/2014   Based on Fam Hx and Beighton Score of 7 this is likely ED III    Hypertrophy of vulva 07/20/2017   Inflammatory dermatosis  04/14/2011   Irritable larynx syndrome 12/02/2015   Lumbar radiculopathy 02/16/2016   Osteoarthritis 04/01/2017   Osteopenia    Primary arthrosis of first carpometacarpal joints, bilateral 06/01/2015   Sacroiliac joint dysfunction of right side 11/26/2014   B SI joint xrays last done at [facility] Dr. Kathlene November 03/13/2017   Skin cancer     Past Surgical History:  Procedure Laterality Date   BREAST EXCISIONAL BIOPSY Left 01/2019   atypical lobular hyperplasia   CARPAL TUNNEL RELEASE Right 06/2020   COLONOSCOPY     DENTAL SURGERY     ELBOW FRACTURE SURGERY Left    as child   RADIOACTIVE SEED GUIDED EXCISIONAL BREAST BIOPSY Left 02/05/2019   Procedure: RADIOACTIVE SEED GUIDED EXCISIONAL LEFT BREAST BIOPSY;  Surgeon: Rolm Bookbinder, MD;  Location: [facility];  Service: General;  Laterality: Left;    Current Medications: Current Meds  Medication Sig   Acetaminophen (TYLENOL 8 HOUR ARTHRITIS PAIN PO) Take 1,300 mg by mouth in the morning and at bedtime.   Albuterol Sulfate (PROAIR RESPICLICK) 203 (90 Base) MCG/ACT AEPB Inhale into the lungs as needed.   Ascorbic Acid (VITAMIN C PO) Take 1,200 mg by mouth 2 (two) times daily.    CHITOSAN PO Take 1,000 mg by mouth in the morning and at bedtime.   Cholecalciferol (VITAMIN D3) 1.25 MG (50000 UT)  TABS Take 1 tablet by mouth once a week.   clonazePAM (KLONOPIN) 0.5 MG tablet Take 1 tablet (0.5 mg total) by mouth at bedtime.   cycloSPORINE (RESTASIS) 0.05 % ophthalmic emulsion Place 1 drop into both eyes 2 (two) times daily.   dicyclomine (BENTYL) 20 MG tablet TAKE 1 TABLET BY MOUTH TWICE A DAY   EPINEPHrine 0.3 mg/0.3 mL IJ SOAJ injection Inject 0.3 mg into the muscle as needed for anaphylaxis.   estradiol (VIVELLE-DOT) 0.075 MG/24HR Place onto the skin every 3 (three) days.   FERROUS BISGLYCINATE CHELATE PO Take 18 mg by mouth daily.   fexofenadine (ALLEGRA) 180 MG tablet Take 1 tablet (180 mg total) by mouth daily.   folic  acid (FOLVITE) 811 MCG tablet Take 800 mcg by mouth daily.   gabapentin (NEURONTIN) 300 MG capsule Take 300 mg by mouth at bedtime.   hyoscyamine (LEVBID) 0.375 MG 12 hr tablet Take 0.375 mg by mouth 2 (two) times daily.   levocetirizine (XYZAL) 5 MG tablet Take 1 tablet (5 mg total) by mouth at bedtime.   MAGNESIUM MALATE PO Take 450 mg by mouth in the morning and at bedtime.   MELATONIN PO Take 30 mg by mouth at bedtime.   Menaquinone-7 (VITAMIN K2 PO) Take 150 mcg by mouth every morning.   montelukast (SINGULAIR) 10 MG tablet Take 1 tablet (10 mg total) by mouth at bedtime.   mupirocin ointment (BACTROBAN) 2 % Apply 1 application topically 3 (three) times daily as needed.   OVER THE COUNTER MEDICATION 306 mg every morning. Estrogen control   OVER THE COUNTER MEDICATION 2 capsules every morning. MTHFR Endure (RIH)   PHOSPHATIDYLSERINE PO 200 mg every morning.   Pregnenolone Micronized (PREGNENOLONE PO) Take 75 mg by mouth 3 (three) times a week.   progesterone (PROMETRIUM) 200 MG capsule progesterone micronized 200 mg capsule  TAKE 3 CAPSULES EVERY NIGHT   sertraline (ZOLOFT) 25 MG tablet TAKE 1 TABLET BY MOUTH EVERY DAY   Testosterone 75 MG PLLT 75 mg every 3 (three) months. Insert 1 pellet every 3 months   traZODone (DESYREL) 100 MG tablet trazodone 100 mg tablet  TAKE 1-2 TABLET BY MOUTH AT BEDTIME     Allergies:   Poison oak extract [poison oak extract], Xiidra [lifitegrast], Penicillins, Sulfa antibiotics, and Sulfamethoxazole-trimethoprim   Social History   Socioeconomic History   Marital status: Single    Spouse name: n/a   Number of children: 0   Years of education: Not on file   Highest education level: Not on file  Occupational History   Occupation: Education administrator    Comment: [employer]  Tobacco Use   Smoking status: Never   Smokeless tobacco: Never  Vaping Use   Vaping Use: Never used  Substance and Sexual Activity   Alcohol use: No     Alcohol/week: 0.0 standard drinks of alcohol   Drug use: No   Sexual activity: Not on file  Other Topics Concern   Not on file  Social History Narrative   Lives with alone   R handed   Caffeine: 1-2 C of coffee in the AM.   Social Determinants of Health   Financial Resource Strain: Not on file  Food Insecurity: Not on file  Transportation Needs: Not on file  Physical Activity: Not on file  Stress: Not on file  Social Connections: Not on file     Family History: The patient's family history includes Alzheimer's disease in her maternal aunt; Angina in  her paternal uncle; Breast cancer in her cousin and cousin; Colon cancer in an other family member; Congestive Heart Failure in her maternal grandmother; Heart Problems in her paternal grandfather, paternal grandmother, and paternal uncle; Heart attack (age of onset: 69) in her paternal uncle; Hypertension in her father and mother; Other in her father and mother; Prostate cancer in her maternal grandfather; Skin cancer (age of onset: 17) in her father; Stomach cancer in her paternal aunt; Stroke in her father. There is no history of Colon polyps, Esophageal cancer, or Rectal cancer.  ROS:   Please see the history of present illness.     All other systems reviewed and are negative.  EKGs/Labs/Other Studies Reviewed:    The following studies were reviewed today:   EKG:   12/21/2021: Normal sinus rhythm, rate 83, no ST abnormalities  Recent Labs: 06/08/2021: Magnesium 2.2 07/05/2021: ALT 17 09/09/2021: BUN 22; Creatinine, Ser 1.11; Hemoglobin 14.3; Platelets 300; Potassium 3.7; Sodium 138  Recent Lipid Panel No results found for: "CHOL", "TRIG", "HDL", "CHOLHDL", "VLDL", "LDLCALC", "LDLDIRECT"  Physical Exam:    VS:  BP 104/70   Pulse 83   Ht 5' 6"  (1.676 m)   Wt 149 lb 9.6 oz (67.9 kg)   LMP 04/08/2014   SpO2 98%   BMI 24.15 kg/m     Wt Readings from Last 3 Encounters:  12/21/21 149 lb 9.6 oz (67.9 kg)  11/29/21 145 lb  (65.8 kg)  08/24/21 147 lb (66.7 kg)     GEN:  Well nourished, well developed in no acute distress HEENT: Normal NECK: No JVD; No carotid bruits LYMPHATICS: No lymphadenopathy CARDIAC: RRR, no murmurs, rubs, gallops RESPIRATORY:  Clear to auscultation without rales, wheezing or rhonchi  ABDOMEN: Soft, non-tender, non-distended MUSCULOSKELETAL:  No edema; No deformity  SKIN: Warm and dry NEUROLOGIC:  Alert and oriented x 3 PSYCHIATRIC:  Normal affect   ASSESSMENT:    1. Chest pain of uncertain etiology   2. Syncope, unspecified syncope type   3. Multiple myeloma, remission status unspecified (HCC)    PLAN:    Syncope: Suspect noncardiac etiology as she reports that when she passes out she will be unconscious for 1 to 5 hours.  She is being evaluated for narcolepsy.  We will check echocardiogram to rule out structural heart disease.  Check Zio patch x2 weeks to evaluate for arrhythmia  Chest pain: Atypical in description but reported recent episode with exertion, suggesting possible angina.  Given need for cardiac MRI to rule out amyloidosis with a history of multiple myeloma, recommend stress MRI to exclude ischemia  MGUS/smoldering myeloma: Cardiac MRI recommended to rule out cardiac amyloidosis  RTC in 4 months  Medication Adjustments/Labs and Tests Ordered: Current medicines are reviewed at length with the patient today.  Concerns regarding medicines are outlined above.  Orders Placed This Encounter  Procedures   MR CARDIAC STRESS TEST   LONG TERM MONITOR (3-14 DAYS)   EKG 12-Lead   ECHOCARDIOGRAM COMPLETE   No orders of the defined types were placed in this encounter.   Patient Instructions  Medication Instructions:  Your physician recommends that you continue on your current medications as directed. Please refer to the Current Medication list given to you today.  *If you need a refill on your cardiac medications before your next appointment, please call your  pharmacy*  Testing/Procedures: Your physician has requested that you have an echocardiogram. Echocardiography is a painless test that uses sound waves to create images of your heart.  It provides your doctor with information about the size and shape of your heart and how well your heart's chambers and valves are working. This procedure takes approximately one hour. There are no restrictions for this procedure.  Stress Cardiac MRI-we will call to schedule this at [place] physician has requested you wear a ZIO patch monitor for _14_ days.  This is a single patch monitor.   [employer] supplies one patch monitor per enrollment. Additional stickers are not available. Please do not apply patch if you will be having a Nuclear Stress Test, Echocardiogram, Cardiac CT, MRI, or Chest Xray during the period you would be wearing the monitor. The patch cannot be worn during these tests. You cannot remove and re-apply the ZIO XT patch monitor.  Your ZIO patch monitor will be sent Fed Ex from [employer] directly to your home address. It may take 3-5 days to receive your monitor after you have been enrolled.  Once you have received your monitor, please review the enclosed instructions. Your monitor has already been registered assigning a specific monitor serial # to you.  Billing and Patient Assistance Program Information   We have supplied [employer] with any of your insurance information on file for billing purposes. [employer] offers a sliding scale Patient Assistance Program for patients that do not have insurance, or whose insurance does not completely cover the cost of the ZIO monitor.   You must apply for the Patient Assistance Program to qualify for this discounted rate.     To apply, please call [employer] at [phone], select option 4, then select option 2, and ask to apply for Patient Assistance Program.  Theodore Demark will ask your household income,  and how many people are in your household.  They will quote your out-of-pocket cost based on that information.  [employer] will also be able to set up a 55-month interest-free payment plan if needed.  Applying the monitor   Shave hair from upper left chest.  Hold abrader disc by orange tab. Rub abrader in 40 strokes over the upper left chest as indicated in your monitor instructions.  Clean area with 4 enclosed alcohol pads. Let dry.  Apply patch as indicated in monitor instructions. Patch will be placed under collarbone on left side of chest with arrow pointing upward.  Rub patch adhesive wings for 2 minutes. Remove white label marked "1". Remove the white label marked "2". Rub patch adhesive wings for 2 additional minutes.  While looking in a mirror, press and release button in center of patch. A small green light will flash 3-4 times. This will be your only indicator that the monitor has been turned on. ?  Do not shower for the first 24 hours. You may shower after the first 24 hours.  Press the button if you feel a symptom. You will hear a small click. Record Date, Time and Symptom in the Patient Logbook.  When you are ready to remove the patch, follow instructions on the last 2 pages of the Patient Logbook. Stick patch monitor onto the last page of Patient Logbook.  Place Patient Logbook in the blue and white box.  Use locking tab on box and tape box closed securely.  The blue and white box has prepaid postage on it. Please place it in the mailbox as soon as possible. Your physician should have your test results approximately 7 days after the monitor has [facility]  Call [facility] [place] at [phone] if you have questions regarding your ZIO XT patch monitor. Call them immediately if you see an orange light blinking on your monitor.  If your monitor falls off in less than 4 days, contact our Monitor department at [phone]. ?If your monitor becomes loose or  falls off after 4 days call [employer] at [phone] for suggestions on securing your monitor.?  Follow-Up: At [facility], you and your health needs are our priority.  As part of our continuing mission to provide you with exceptional heart care, we have created designated Provider Care Teams.  These Care Teams include your primary Cardiologist (physician) and Advanced Practice Providers (APPs -  Physician Assistants and Nurse Practitioners) who all work together to provide you with the care you need, when you need it.  We recommend signing up for the patient portal called "MyChart".  Sign up information is provided on this After Visit Summary.  MyChart is used to connect with patients for Virtual Visits (Telemedicine).  Patients are able to view lab/test results, encounter notes, upcoming appointments, etc.  Non-urgent messages can be sent to your provider as well.   To learn more about what you can do with MyChart, go to [email or web address].    Your next appointment:   4 month(s)  The format for your next appointment:   In Person  Provider:   Dr. Gardiner Rhyme          Signed, Donato Heinz, MD  12/23/2021 3:30 PM    [place]

## 2021-12-23 DIAGNOSIS — H02715 Chloasma of left lower eyelid and periocular area: Secondary | ICD-10-CM | POA: Diagnosis not present

## 2021-12-24 DIAGNOSIS — T7421XA Adult sexual abuse, confirmed, initial encounter: Secondary | ICD-10-CM | POA: Diagnosis not present

## 2021-12-24 DIAGNOSIS — Q796 Ehlers-Danlos syndrome, unspecified: Secondary | ICD-10-CM | POA: Diagnosis not present

## 2021-12-24 DIAGNOSIS — G894 Chronic pain syndrome: Secondary | ICD-10-CM | POA: Diagnosis not present

## 2021-12-28 DIAGNOSIS — G894 Chronic pain syndrome: Secondary | ICD-10-CM | POA: Diagnosis not present

## 2022-01-03 ENCOUNTER — Ambulatory Visit (INDEPENDENT_AMBULATORY_CARE_PROVIDER_SITE_OTHER): Payer: BC Managed Care – PPO

## 2022-01-03 DIAGNOSIS — I340 Nonrheumatic mitral (valve) insufficiency: Secondary | ICD-10-CM | POA: Diagnosis not present

## 2022-01-03 DIAGNOSIS — R55 Syncope and collapse: Secondary | ICD-10-CM

## 2022-01-03 LAB — ECHOCARDIOGRAM COMPLETE
AR max vel: 2.87 cm2
AV Area VTI: 2.66 cm2
AV Area mean vel: 2.67 cm2
AV Mean grad: 3 mmHg
AV Peak grad: 4.7 mmHg
Ao pk vel: 1.08 m/s
Area-P 1/2: 3.97 cm2
Calc EF: 62.9 %
MV M vel: 5.02 m/s
MV Peak grad: 100.8 mmHg
S' Lateral: 3.37 cm
Single Plane A2C EF: 63.6 %
Single Plane A4C EF: 64.1 %

## 2022-01-10 DIAGNOSIS — F32A Depression, unspecified: Secondary | ICD-10-CM | POA: Diagnosis not present

## 2022-01-10 DIAGNOSIS — M797 Fibromyalgia: Secondary | ICD-10-CM | POA: Diagnosis not present

## 2022-01-10 DIAGNOSIS — G894 Chronic pain syndrome: Secondary | ICD-10-CM | POA: Diagnosis not present

## 2022-01-11 ENCOUNTER — Other Ambulatory Visit: Payer: Self-pay | Admitting: *Deleted

## 2022-01-11 ENCOUNTER — Encounter: Payer: Self-pay | Admitting: Gastroenterology

## 2022-01-11 MED ORDER — MONTELUKAST SODIUM 10 MG PO TABS: 10.0000 mg | ORAL_TABLET | Freq: Every day | ORAL | 1 refills | Status: DC

## 2022-01-11 MED ORDER — LEVOCETIRIZINE DIHYDROCHLORIDE 5 MG PO TABS: 5.0000 mg | ORAL_TABLET | Freq: Every day | ORAL | 1 refills | Status: DC

## 2022-01-14 DIAGNOSIS — R55 Syncope and collapse: Secondary | ICD-10-CM

## 2022-01-14 DIAGNOSIS — K529 Noninfective gastroenteritis and colitis, unspecified: Secondary | ICD-10-CM | POA: Diagnosis not present

## 2022-01-14 DIAGNOSIS — D472 Monoclonal gammopathy: Secondary | ICD-10-CM | POA: Diagnosis not present

## 2022-01-14 DIAGNOSIS — D839 Common variable immunodeficiency, unspecified: Secondary | ICD-10-CM | POA: Diagnosis not present

## 2022-01-14 DIAGNOSIS — G90A Postural orthostatic tachycardia syndrome (POTS): Secondary | ICD-10-CM | POA: Diagnosis not present

## 2022-01-19 ENCOUNTER — Other Ambulatory Visit: Payer: Self-pay

## 2022-01-19 ENCOUNTER — Encounter (HOSPITAL_BASED_OUTPATIENT_CLINIC_OR_DEPARTMENT_OTHER): Payer: Self-pay

## 2022-01-19 ENCOUNTER — Emergency Department (HOSPITAL_BASED_OUTPATIENT_CLINIC_OR_DEPARTMENT_OTHER)
Admission: EM | Admit: 2022-01-19 | Discharge: 2022-01-19 | Disposition: A | Discharge status: Home / Self Care | Payer: BC Managed Care – PPO | Attending: Emergency Medicine | Admitting: Emergency Medicine

## 2022-01-19 DIAGNOSIS — J45909 Unspecified asthma, uncomplicated: Secondary | ICD-10-CM | POA: Diagnosis not present

## 2022-01-19 DIAGNOSIS — Z760 Encounter for issue of repeat prescription: Secondary | ICD-10-CM | POA: Insufficient documentation

## 2022-01-19 DIAGNOSIS — N390 Urinary tract infection, site not specified: Secondary | ICD-10-CM

## 2022-01-19 DIAGNOSIS — R1110 Vomiting, unspecified: Secondary | ICD-10-CM | POA: Insufficient documentation

## 2022-01-19 DIAGNOSIS — N9489 Other specified conditions associated with female genital organs and menstrual cycle: Secondary | ICD-10-CM | POA: Diagnosis not present

## 2022-01-19 DIAGNOSIS — R197 Diarrhea, unspecified: Secondary | ICD-10-CM | POA: Diagnosis not present

## 2022-01-19 LAB — COMPREHENSIVE METABOLIC PANEL
ALT: 11 U/L (ref 0–44)
AST: 15 U/L (ref 15–41)
Albumin: 4.1 g/dL (ref 3.5–5.0)
Alkaline Phosphatase: 47 U/L (ref 38–126)
Anion gap: 12 (ref 5–15)
BUN: 12 mg/dL (ref 6–20)
CO2: 20 mmol/L — ABNORMAL LOW (ref 22–32)
Calcium: 9.3 mg/dL (ref 8.9–10.3)
Chloride: 102 mmol/L (ref 98–111)
Creatinine, Ser: 1.17 mg/dL — ABNORMAL HIGH (ref 0.44–1.00)
GFR, Estimated: 55 mL/min — ABNORMAL LOW (ref >60)
Glucose, Bld: 104 mg/dL — ABNORMAL HIGH (ref 70–99)
Potassium: 3.7 mmol/L (ref 3.5–5.1)
Sodium: 134 mmol/L — ABNORMAL LOW (ref 135–145)
Total Bilirubin: 0.4 mg/dL (ref 0.3–1.2)
Total Protein: 7.4 g/dL (ref 6.5–8.1)

## 2022-01-19 LAB — URINALYSIS, ROUTINE W REFLEX MICROSCOPIC
Bilirubin Urine: NEGATIVE
Glucose, UA: NEGATIVE mg/dL
Ketones, ur: NEGATIVE mg/dL
Nitrite: POSITIVE — AB
Protein, ur: NEGATIVE mg/dL
Specific Gravity, Urine: <1.005 — ABNORMAL LOW (ref 1.005–1.030)
pH: 6 (ref 5.0–8.0)

## 2022-01-19 LAB — CBC
HCT: 39.6 % (ref 36.0–46.0)
Hemoglobin: 13.8 g/dL (ref 12.0–15.0)
MCH: 31.9 pg (ref 26.0–34.0)
MCHC: 34.8 g/dL (ref 30.0–36.0)
MCV: 91.7 fL (ref 80.0–100.0)
Platelets: 342 10*3/uL (ref 150–400)
RBC: 4.32 MIL/uL (ref 3.87–5.11)
RDW: 12.6 % (ref 11.5–15.5)
WBC: 9.3 10*3/uL (ref 4.0–10.5)
nRBC: 0 % (ref 0.0–0.2)

## 2022-01-19 LAB — LIPASE, BLOOD: Lipase: 189 U/L — ABNORMAL HIGH (ref 11–51)

## 2022-01-19 LAB — HCG, SERUM, QUALITATIVE: Preg, Serum: NEGATIVE

## 2022-01-19 MED ORDER — CEPHALEXIN 500 MG PO CAPS
500.0000 mg | ORAL_CAPSULE | Freq: Four times a day (QID) | ORAL | 0 refills | Status: AC
  Filled 2022-01-19: qty 28, 7d supply, fill #0

## 2022-01-19 MED ORDER — CEPHALEXIN 250 MG PO CAPS
500.0000 mg | ORAL_CAPSULE | Freq: Once | ORAL | Status: AC
  Administered 2022-01-19: 500 mg via ORAL
  Filled 2022-01-19: qty 2

## 2022-01-19 MED ORDER — DIPHENOXYLATE-ATROPINE 2.5-0.025 MG PO TABS
1.0000 | ORAL_TABLET | Freq: Four times a day (QID) | ORAL | 1 refills | Status: DC | PRN
  Filled 2022-01-19: qty 20, 5d supply, fill #0

## 2022-01-19 MED ORDER — SODIUM CHLORIDE 0.9 % IV BOLUS
1000.0000 mL | Freq: Once | INTRAVENOUS | Status: AC
  Administered 2022-01-19: 1000 mL via INTRAVENOUS

## 2022-01-19 MED ORDER — PROMETHAZINE HCL 25 MG PO TABS
25.0000 mg | ORAL_TABLET | Freq: Four times a day (QID) | ORAL | 0 refills | Status: DC | PRN
  Filled 2022-01-19: qty 20, 5d supply, fill #0

## 2022-01-19 NOTE — ED Provider Notes (Signed)
[facility] EMERGENCY DEPT Provider Note   CSN: [id number] Arrival date & time: 01/19/22  1445     History  Chief Complaint  Patient presents with   Diarrhea    Lindsey [name] is a 56 y.o. female.  Patient complains of vomiting and diarrhea.  Patient has a complicated past medical history and is followed at [facility] for smoldering multiple myeloma.Past Medical History:of asthma, allergic rhinitis, contact dermatitis, self-reported Ehler's Danlos Syndrome, elevated IgA, and gastroenteritis.She has an elevated IgA, low IgM, low IgG) and is currently awaiting insurance approval for treatment.  Patient reports she has had chronic diarrhea for 1-1/2 years.  Patient reports that she comes to the emergency [facility] tonight hoping to get IV fluids because she is feeling weak and dehydrated.  Patient reports these are her chronic symptoms.  She has not had any fever she has not had any chills.  She does not have any new or acute abdominal pain.  Patient is requesting a refill of Lomotil and Phenergan.     The history is provided by the patient. No language interpreter was used.  Diarrhea Quality:  Watery Number of episodes:  Multiple Timing:  Constant Progression:  Worsening      Home Medications Prior to Admission medications   Medication Sig Start Date End Date Taking? Authorizing Provider  Acetaminophen (TYLENOL 8 HOUR ARTHRITIS PAIN PO) Take 1,300 mg by mouth in the morning and at bedtime.    [provider]  Albuterol Sulfate (PROAIR RESPICLICK) 784 (90 Base) MCG/ACT AEPB Inhale into the lungs as needed.    [provider]  Ascorbic Acid (VITAMIN C PO) Take 1,200 mg by mouth 2 (two) times daily.     [provider]  CHITOSAN PO Take 1,000 mg by mouth in the morning and at bedtime.    [provider]  Cholecalciferol (VITAMIN D3) 1.25 MG (50000 UT) TABS Take 1 tablet by mouth once a week.    [provider]  clonazePAM  (KLONOPIN) 0.5 MG tablet Take 1 tablet (0.5 mg total) by mouth at bedtime. 03/15/21   Dohmeier, Asencion Partridge, MD  cycloSPORINE (RESTASIS) 0.05 % ophthalmic emulsion Place 1 drop into both eyes 2 (two) times daily.    [provider]  dicyclomine (BENTYL) 20 MG tablet TAKE 1 TABLET BY MOUTH TWICE A DAY 09/13/21   Cirigliano, Vito V, DO  EPINEPHrine 0.3 mg/0.3 mL IJ SOAJ injection Inject 0.3 mg into the muscle as needed for anaphylaxis. 10/05/21   Kennith Gain, MD  estradiol (VIVELLE-DOT) 0.075 MG/24HR Place onto the skin every 3 (three) days. 12/20/20   [provider]  FERROUS BISGLYCINATE CHELATE PO Take 18 mg by mouth daily.    [provider]  fexofenadine (ALLEGRA) 180 MG tablet Take 1 tablet (180 mg total) by mouth daily. 08/12/21   Kennith Gain, MD  folic acid (FOLVITE) 696 MCG tablet Take 800 mcg by mouth daily.    [provider]  gabapentin (NEURONTIN) 300 MG capsule Take 300 mg by mouth at bedtime. 04/17/14   [provider]  hyoscyamine (LEVBID) 0.375 MG 12 hr tablet Take 0.375 mg by mouth 2 (two) times daily.    [provider]  levocetirizine (XYZAL) 5 MG tablet Take 1 tablet (5 mg total) by mouth at bedtime. 01/11/22   Kennith Gain, MD  MAGNESIUM MALATE PO Take 450 mg by mouth in the morning and at bedtime.    [provider]  MELATONIN PO Take 30  mg by mouth at bedtime.    [provider]  Menaquinone-7 (VITAMIN K2 PO) Take 150 mcg by mouth every morning.    [provider]  montelukast (SINGULAIR) 10 MG tablet Take 1 tablet (10 mg total) by mouth at bedtime. 01/11/22   Kennith Gain, MD  mupirocin ointment (BACTROBAN) 2 % Apply 1 application topically 3 (three) times daily as needed.    [provider]  OVER THE COUNTER MEDICATION 306 mg every morning. Estrogen control    [provider]  OVER THE COUNTER MEDICATION 2 capsules every morning. MTHFR  Endure (RIH)    [provider]  PHOSPHATIDYLSERINE PO 200 mg every morning.    [provider]  Pregnenolone Micronized (PREGNENOLONE PO) Take 75 mg by mouth 3 (three) times a week.    [provider]  progesterone (PROMETRIUM) 200 MG capsule progesterone micronized 200 mg capsule  TAKE 3 CAPSULES EVERY NIGHT    [provider]  sertraline (ZOLOFT) 25 MG tablet TAKE 1 TABLET BY MOUTH EVERY DAY 11/02/20   Dohmeier, Asencion Partridge, MD  Testosterone 75 MG PLLT 75 mg every 3 (three) months. Insert 1 pellet every 3 months    [provider]  traZODone (DESYREL) 100 MG tablet trazodone 100 mg tablet  TAKE 1-2 TABLET BY MOUTH AT BEDTIME    [provider]      Allergies    Poison oak extract [poison oak extract], Xiidra [lifitegrast], Penicillins, Sulfa antibiotics, and Sulfamethoxazole-trimethoprim    Review of Systems   Review of Systems  Gastrointestinal:  Positive for diarrhea.  All other systems reviewed and are negative.   Physical Exam Updated Vital Signs BP 100/88   Pulse 66   Temp (!) 97.5 F (36.4 C) (Oral)   Resp 16   Ht 5' 6"  (1.676 m)   Wt 67.9 kg   LMP 04/08/2014   SpO2 100%   BMI 24.16 kg/m  Physical Exam Vitals and nursing note reviewed.  Constitutional:      Appearance: She is well-developed.  HENT:     Head: Normocephalic.  Cardiovascular:     Rate and Rhythm: Normal rate.  Pulmonary:     Effort: Pulmonary effort is normal.  Abdominal:     General: There is no distension.     Palpations: Abdomen is soft.  Musculoskeletal:        General: Normal range of motion.     Cervical back: Normal range of motion.  Skin:    General: Skin is warm.  Neurological:     General: No focal deficit present.     Mental Status: She is alert and oriented to person, place, and time.     ED Results / Procedures / Treatments   Labs (all labs ordered are listed, but only abnormal results are displayed) Labs Reviewed  LIPASE,  BLOOD - Abnormal; Notable for the following components:      Result Value   Lipase 189 (*)    All other components within normal limits  COMPREHENSIVE METABOLIC PANEL - Abnormal; Notable for the following components:   Sodium 134 (*)    CO2 20 (*)    Glucose, Bld 104 (*)    Creatinine, Ser 1.17 (*)    GFR, Estimated 55 (*)    All other components within normal limits  URINALYSIS, ROUTINE W REFLEX MICROSCOPIC - Abnormal; Notable for the following components:   APPearance HAZY (*)    Specific Gravity, Urine <1.005 (*)    Hgb urine  dipstick MODERATE (*)    Nitrite POSITIVE (*)    Leukocytes,Ua LARGE (*)    Bacteria, UA RARE (*)    All other components within normal limits  CBC  HCG, SERUM, QUALITATIVE    EKG None  Radiology No results found.  Procedures Procedures    Medications Ordered in ED Medications  sodium chloride 0.9 % bolus 1,000 mL (0 mLs Intravenous Stopped 01/19/22 1930)  sodium chloride 0.9 % bolus 1,000 mL (1,000 mLs Intravenous New Bag/Given 01/19/22 2040)    ED Course/ Medical Decision Making/ A&P                           Medical Decision Making Patient complains of chronic diarrhea and vomiting.  Pt reports she feels dehydrated.  She is suppose to see her Md [facility] next [facility]   Amount and/or Complexity of Data Reviewed External Data Reviewed: notes.    Details: I reviewed notes form [facility]  Labs: ordered. Decision-making details documented in ED Course.    Details: Ua shows large leukocytes and nitrates   Labs ordered reviewed and interpreted   Risk Prescription drug management.           Final Clinical Impression(s) / ED Diagnoses Final diagnoses:  Diarrhea, unspecified type    Rx / DC Orders ED Discharge Orders          Ordered    promethazine (PHENERGAN) 25 MG tablet  Every 6 hours PRN        01/19/22 2212    diphenoxylate-atropine (LOMOTIL) 2.5-0.025 MG tablet  4 times daily PRN        01/19/22 2212          An After  Visit Summary was printed and given to the patient.     Fransico Meadow, PA-C 01/19/22 2216    Varney Biles, MD 01/27/22 1556

## 2022-01-19 NOTE — Discharge Instructions (Addendum)
Follow up with your provider at [facility].  Return if any problems.

## 2022-01-19 NOTE — ED Triage Notes (Signed)
Patient here POV from Home.  Endorses this is a "Patch-Me-Up" Visit. She has been having a recurrence of Chronic Diarrhea (2 Years). Associated with N/V. States during this recurrences they usually provide IV Hydration until she can have Follow-Up Visitation from [facility].   NAD Noted during Triage. A&Ox4. GCS 15. BIB Wheelchair.

## 2022-01-20 ENCOUNTER — Other Ambulatory Visit (HOSPITAL_BASED_OUTPATIENT_CLINIC_OR_DEPARTMENT_OTHER): Payer: Self-pay

## 2022-01-27 DIAGNOSIS — R55 Syncope and collapse: Secondary | ICD-10-CM | POA: Diagnosis not present

## 2022-02-02 ENCOUNTER — Emergency Department (HOSPITAL_BASED_OUTPATIENT_CLINIC_OR_DEPARTMENT_OTHER): Payer: BC Managed Care – PPO

## 2022-02-02 ENCOUNTER — Emergency Department (HOSPITAL_BASED_OUTPATIENT_CLINIC_OR_DEPARTMENT_OTHER)
Admission: EM | Admit: 2022-02-02 | Discharge: 2022-02-02 | Disposition: A | Discharge status: Home / Self Care | Payer: BC Managed Care – PPO | Attending: Emergency Medicine | Admitting: Emergency Medicine

## 2022-02-02 ENCOUNTER — Encounter (HOSPITAL_BASED_OUTPATIENT_CLINIC_OR_DEPARTMENT_OTHER): Payer: Self-pay

## 2022-02-02 ENCOUNTER — Other Ambulatory Visit: Payer: Self-pay

## 2022-02-02 DIAGNOSIS — R1084 Generalized abdominal pain: Secondary | ICD-10-CM | POA: Diagnosis not present

## 2022-02-02 DIAGNOSIS — E878 Other disorders of electrolyte and fluid balance, not elsewhere classified: Secondary | ICD-10-CM | POA: Diagnosis not present

## 2022-02-02 DIAGNOSIS — K8500 Idiopathic acute pancreatitis without necrosis or infection: Secondary | ICD-10-CM | POA: Insufficient documentation

## 2022-02-02 DIAGNOSIS — R748 Abnormal levels of other serum enzymes: Secondary | ICD-10-CM | POA: Insufficient documentation

## 2022-02-02 DIAGNOSIS — K859 Acute pancreatitis without necrosis or infection, unspecified: Secondary | ICD-10-CM | POA: Diagnosis not present

## 2022-02-02 DIAGNOSIS — R1032 Left lower quadrant pain: Secondary | ICD-10-CM | POA: Diagnosis not present

## 2022-02-02 LAB — COMPREHENSIVE METABOLIC PANEL
ALT: 84 U/L — ABNORMAL HIGH (ref 0–44)
AST: 41 U/L (ref 15–41)
Albumin: 3.6 g/dL (ref 3.5–5.0)
Alkaline Phosphatase: 147 U/L — ABNORMAL HIGH (ref 38–126)
Anion gap: 13 (ref 5–15)
BUN: 11 mg/dL (ref 6–20)
CO2: 19 mmol/L — ABNORMAL LOW (ref 22–32)
Calcium: 8.9 mg/dL (ref 8.9–10.3)
Chloride: 105 mmol/L (ref 98–111)
Creatinine, Ser: 0.97 mg/dL (ref 0.44–1.00)
GFR, Estimated: >60 mL/min (ref >60)
Glucose, Bld: 98 mg/dL (ref 70–99)
Potassium: 4.1 mmol/L (ref 3.5–5.1)
Sodium: 137 mmol/L (ref 135–145)
Total Bilirubin: 0.4 mg/dL (ref 0.3–1.2)
Total Protein: 6.9 g/dL (ref 6.5–8.1)

## 2022-02-02 LAB — URINALYSIS, ROUTINE W REFLEX MICROSCOPIC
Bilirubin Urine: NEGATIVE
Glucose, UA: NEGATIVE mg/dL
Ketones, ur: NEGATIVE mg/dL
Leukocytes,Ua: NEGATIVE
Nitrite: NEGATIVE
Protein, ur: NEGATIVE mg/dL
Specific Gravity, Urine: 1.009 (ref 1.005–1.030)
pH: 7 (ref 5.0–8.0)

## 2022-02-02 LAB — LIPASE, BLOOD: Lipase: 295 U/L — ABNORMAL HIGH (ref 11–51)

## 2022-02-02 LAB — CBC
HCT: 37.6 % (ref 36.0–46.0)
Hemoglobin: 12.9 g/dL (ref 12.0–15.0)
MCH: 31.1 pg (ref 26.0–34.0)
MCHC: 34.3 g/dL (ref 30.0–36.0)
MCV: 90.6 fL (ref 80.0–100.0)
Platelets: 501 10*3/uL — ABNORMAL HIGH (ref 150–400)
RBC: 4.15 MIL/uL (ref 3.87–5.11)
RDW: 12.3 % (ref 11.5–15.5)
WBC: 11.6 10*3/uL — ABNORMAL HIGH (ref 4.0–10.5)
nRBC: 0 % (ref 0.0–0.2)

## 2022-02-02 MED ORDER — IOHEXOL 300 MG/ML  SOLN
100.0000 mL | Freq: Once | INTRAMUSCULAR | Status: AC | PRN
  Administered 2022-02-02: 80 mL via INTRAVENOUS

## 2022-02-02 MED ORDER — ALUM & MAG HYDROXIDE-SIMETH 200-200-20 MG/5ML PO SUSP
30.0000 mL | Freq: Once | ORAL | Status: AC
  Administered 2022-02-02: 30 mL via ORAL
  Filled 2022-02-02: qty 30

## 2022-02-02 MED ORDER — SODIUM CHLORIDE 0.9 % IV BOLUS
500.0000 mL | Freq: Once | INTRAVENOUS | Status: AC
  Administered 2022-02-02: 500 mL via INTRAVENOUS

## 2022-02-02 MED ORDER — KETOROLAC TROMETHAMINE 15 MG/ML IJ SOLN
15.0000 mg | Freq: Once | INTRAMUSCULAR | Status: DC | PRN
  Filled 2022-02-02: qty 1

## 2022-02-02 NOTE — ED Notes (Signed)
Pt discharged home after verbalizing understanding of discharge instructions; nad noted. 

## 2022-02-02 NOTE — ED Provider Notes (Signed)
Care transferred to me.  CT is unrevealing.  I personally viewed/interpreted these images.  Agree with radiology.  She does have an elevated lipase but her symptoms are not really consistent with pancreatitis and is more of a chronic abdominal pain than anything else.  At this point, will discharge to follow-up with GI as an outpatient.   Sherwood Gambler, MD 02/02/22 1710

## 2022-02-02 NOTE — Discharge Instructions (Signed)
If you develop worsening, continued, or recurrent abdominal pain, uncontrolled vomiting, fever, chest or back pain, or any other new/concerning symptoms then return to the ER for evaluation.  

## 2022-02-02 NOTE — ED Provider Notes (Addendum)
[facility] EMERGENCY DEPT Provider Note   CSN: [id number] Arrival date & time: 02/02/22  1159     History  Chief Complaint  Patient presents with   Abdominal Pain    Lindsey [name] is a 56 y.o. female.  Patient presents from home after recently being treated with antibiotics for recurrent UTI.  Patient's had recurrent diarrhea nonbloody but multiple episodes between 5 and 20/day but now having generalized abdominal pain and back pain.  Patient unfortunately has multiple areas of pain normally.  Patient has mild urinary symptoms.  Patient denies any fevers however has history of combined immunodeficiency/MGUS/ POTS and followed at [facility] and locally for recurrent concerns.  Patient's waiting for approval from insurance for special infusion/treatment.       Home Medications Prior to Admission medications   Medication Sig Start Date End Date Taking? Authorizing Provider  Acetaminophen (TYLENOL 8 HOUR ARTHRITIS PAIN PO) Take 1,300 mg by mouth in the morning and at bedtime.    [provider]  Albuterol Sulfate (PROAIR RESPICLICK) 413 (90 Base) MCG/ACT AEPB Inhale into the lungs as needed.    [provider]  amitriptyline (ELAVIL) 25 MG tablet Take 25 mg by mouth at bedtime. 01/17/22   [provider]  Ascorbic Acid (VITAMIN C PO) Take 1,200 mg by mouth 2 (two) times daily.     [provider]  CHITOSAN PO Take 1,000 mg by mouth in the morning and at bedtime.    [provider]  Cholecalciferol (VITAMIN D3) 1.25 MG (50000 UT) TABS Take 1 tablet by mouth once a week.    [provider]  cimetidine (TAGAMET) 400 MG tablet Take 400 mg by mouth 2 (two) times daily. 12/31/21   [provider]  CLINPRO 5000 1.1 % PSTE Place onto teeth. 01/05/22   [provider]  clonazePAM (KLONOPIN) 0.5 MG tablet Take 1 tablet (0.5 mg total) by mouth at bedtime. 03/15/21   Dohmeier, Asencion Partridge, MD  cycloSPORINE (RESTASIS) 0.05  % ophthalmic emulsion Place 1 drop into both eyes 2 (two) times daily.    [provider]  dicyclomine (BENTYL) 20 MG tablet TAKE 1 TABLET BY MOUTH TWICE A DAY 09/13/21   Cirigliano, Vito V, DO  diphenoxylate-atropine (LOMOTIL) 2.5-0.025 MG tablet Take 1 tablet by mouth 4 (four) times daily as needed for diarrhea or loose stools. 01/19/22 01/19/23  Fransico Meadow, PA-C  EPINEPHrine 0.3 mg/0.3 mL IJ SOAJ injection Inject 0.3 mg into the muscle as needed for anaphylaxis. 10/05/21   Kennith Gain, MD  estradiol (VIVELLE-DOT) 0.05 MG/24HR patch SMARTSIG:1 Patch(s) T-DERMAL Every 3 Days 12/24/21   [provider]  estradiol (VIVELLE-DOT) 0.075 MG/24HR Place onto the skin every 3 (three) days. 12/20/20   [provider]  FERROUS BISGLYCINATE CHELATE PO Take 18 mg by mouth daily.    [provider]  fexofenadine (ALLEGRA) 180 MG tablet Take 1 tablet (180 mg total) by mouth daily. 08/12/21   Kennith Gain, MD  folic acid (FOLVITE) 244 MCG tablet Take 800 mcg by mouth daily.    [provider]  gabapentin (NEURONTIN) 300 MG capsule Take 300 mg by mouth at bedtime. 04/17/14   [provider]  hyoscyamine (LEVBID) 0.375 MG 12 hr tablet Take 0.375 mg by mouth 2 (two) times daily.    [provider]  levocetirizine (XYZAL) 5 MG tablet Take 1 tablet (5 mg total) by mouth at bedtime. 01/11/22   Kennith Gain, MD  MAGNESIUM MALATE  PO Take 450 mg by mouth in the morning and at bedtime.    [provider]  MELATONIN PO Take 30 mg by mouth at bedtime.    [provider]  Menaquinone-7 (VITAMIN K2 PO) Take 150 mcg by mouth every morning.    [provider]  methocarbamol (ROBAXIN) 750 MG tablet Take 750 mg by mouth daily. 01/16/22   [provider]  montelukast (SINGULAIR) 10 MG tablet Take 1 tablet (10 mg total) by mouth at bedtime. 01/11/22   Kennith Gain, MD  mupirocin ointment  (BACTROBAN) 2 % Apply 1 application topically 3 (three) times daily as needed.    [provider]  OVER THE COUNTER MEDICATION 306 mg every morning. Estrogen control    [provider]  OVER THE COUNTER MEDICATION 2 capsules every morning. MTHFR Endure (RIH)    [provider]  PHOSPHATIDYLSERINE PO 200 mg every morning.    [provider]  Pregnenolone Micronized (PREGNENOLONE PO) Take 75 mg by mouth 3 (three) times a week.    [provider]  progesterone (PROMETRIUM) 200 MG capsule progesterone micronized 200 mg capsule  TAKE 3 CAPSULES EVERY NIGHT    [provider]  promethazine (PHENERGAN) 25 MG tablet Take 1 tablet (25 mg total) by mouth every 6 (six) hours as needed for nausea or vomiting. 01/19/22   Fransico Meadow, PA-C  sertraline (ZOLOFT) 25 MG tablet TAKE 1 TABLET BY MOUTH EVERY DAY 11/02/20   Dohmeier, Asencion Partridge, MD  Testosterone 75 MG PLLT 75 mg every 3 (three) months. Insert 1 pellet every 3 months    [provider]  traZODone (DESYREL) 100 MG tablet trazodone 100 mg tablet  TAKE 1-2 TABLET BY MOUTH AT BEDTIME    [provider]      Allergies    Poison oak extract [poison oak extract], Xiidra [lifitegrast], Penicillins, Sulfa antibiotics, and Sulfamethoxazole-trimethoprim    Review of Systems   Review of Systems  Constitutional:  Negative for chills and fever.  HENT:  Negative for congestion.   Eyes:  Negative for visual disturbance.  Respiratory:  Negative for shortness of breath.   Cardiovascular:  Negative for chest pain.  Gastrointestinal:  Positive for abdominal pain and diarrhea. Negative for vomiting.  Genitourinary:  Positive for dysuria and flank pain.  Musculoskeletal:  Positive for back pain. Negative for neck pain and neck stiffness.  Skin:  Negative for rash.  Neurological:  Negative for light-headedness and headaches.    Physical Exam Updated Vital Signs BP (!) 131/93 (BP Location:  Left Arm)   Pulse 78   Temp 98.4 F (36.9 C) (Oral)   Resp 18   Ht 5' 6"  (1.676 m)   Wt 67.9 kg   LMP 04/08/2014   SpO2 99%   BMI 24.16 kg/m  Physical Exam Vitals and nursing note reviewed.  Constitutional:      General: She is not in acute distress.    Appearance: She is well-developed.  HENT:     Head: Normocephalic and atraumatic.     Mouth/Throat:     Mouth: Mucous membranes are moist.  Eyes:     General:        Right eye: No discharge.        Left eye: No discharge.     Conjunctiva/sclera: Conjunctivae normal.  Neck:     Trachea: No tracheal deviation.  Cardiovascular:     Rate and Rhythm: Normal rate.     Heart sounds: No murmur  heard. Pulmonary:     Effort: Pulmonary effort is normal.  Abdominal:     General: There is no distension.     Palpations: Abdomen is soft.     Tenderness: There is abdominal tenderness in the periumbilical area. There is no guarding.  Musculoskeletal:     Cervical back: Normal range of motion and neck supple. No rigidity.  Skin:    General: Skin is warm.     Capillary Refill: Capillary refill takes less than 2 seconds.     Findings: No rash.  Neurological:     General: No focal deficit present.     Mental Status: She is alert.     Cranial Nerves: No cranial nerve deficit.  Psychiatric:        Mood and Affect: Mood normal.     ED Results / Procedures / Treatments   Labs (all labs ordered are listed, but only abnormal results are displayed) Labs Reviewed  LIPASE, BLOOD - Abnormal; Notable for the following components:      Result Value   Lipase 295 (*)    All other components within normal limits  COMPREHENSIVE METABOLIC PANEL - Abnormal; Notable for the following components:   CO2 19 (*)    ALT 84 (*)    Alkaline Phosphatase 147 (*)    All other components within normal limits  CBC - Abnormal; Notable for the following components:   WBC 11.6 (*)    Platelets 501 (*)    All other components within normal limits   URINALYSIS, ROUTINE W REFLEX MICROSCOPIC - Abnormal; Notable for the following components:   Hgb urine dipstick TRACE (*)    Bacteria, UA RARE (*)    All other components within normal limits  URINE CULTURE    EKG None  Radiology No results found.  Procedures Procedures    Medications Ordered in ED Medications  alum & mag hydroxide-simeth (MAALOX/MYLANTA) 200-200-20 MG/5ML suspension 30 mL (has no administration in time range)  sodium chloride 0.9 % bolus 500 mL (has no administration in time range)    ED Course/ Medical Decision Making/ A&P                           Medical Decision Making Amount and/or Complexity of Data Reviewed Labs: ordered. Radiology: ordered.  Risk OTC drugs.   Patient with complex medical history followed by multiple specialist presents for acute on chronic abdominal pain and persistent urinary symptoms despite completing antibiotics recently.  Patient states she does present differently due to her medical history/immune system deficiency.  Fortunately on exam she looks well, smiling, conversant, no distress.  Differential broad includes colitis, toxin mediated, Ehlers-Danlos related, acute cystitis, lymphadenitis, other.  Medical records reviewed and last office visit October 6 pasted below summary. Infectious Disease and Immunology: [facility] says that her current SP is not in network, and that she has to infuse in a hospital. She has not had any antibiotics. She did have a continuous cold, she has had sore throat, myalgias, congestion.  She has had watery diarrhea multiple stools, and she has appointment to see GI at [facility] in December 2023. She had EGD in March 2023, colonoscopy in 2022.   MGUS: She saw Oncology on 12/08/21 and it was recommended to observe, monitor every 3 months.  She previously saw Oncology for elevated IgA with monoclonal gammopathy on 10/20/21 Dr. Almon Hercules, had bone marrow biopsy and was diagnosed with smoldering  myeloma/MGUS. CT scan  every 3 months to assess for bone lesions, but hold on chemotherapy for now. She had additional workup for elevated IgA levels in 2023 outside facility. She was noted to have 0.7 g IgA lambda monoclonal spike. IgA level elevated at 1180 with suppression of IgG and IgM at 4:26 a.m. and 18 respectively. Kappa light chain 1.17, lambda 0.7 with ratio of 0.44 24 hour urine with 110 mg proteinuria, immunofixation negative Bone marrow biopsy showed 5% plasma cells in the aspirate and 10% in the core biopsy. FISH could not be done because of too few plasma cells.  She had CT skeletal survey which did not show any suspicious osseous lesions to suggest multiple myeloma.  She saw Nephrology on 11/25/21, renal function was decreasing, and there was no suspicion for myeloma kidney related disease. CT skeletal survey 11/15/21  Syncope: She was evaluated by Cardiology for syncope on 12/21/21.   She was diagnosed with POTS, she says she faints on a daily basis, fell twice and sprained her ankle. She has an appointment with Dr. [facility] to evaluate for POTS.  She has a history of elevated creatinine; she has an appointment with [facility] for elevated IgA  Patient's work-up overall unremarkable mild low bicarb 19, white blood cell count normal, electrolytes unremarkable patient compensating well despite multiple diarrhea episodes.  Lipase elevated to 95.  Urinalysis resulted probably reviewed no signs significant infection.  Urine culture sent.  Discussed risks/benefits, pt agreed to CT abdo GI cocktail ordered, IV fluids ordered.  Patient care signed out to follow-up results and reassess prior to discharge.         Final Clinical Impression(s) / ED Diagnoses Final diagnoses:  Generalized abdominal pain  Idiopathic acute pancreatitis without infection or necrosis    Rx / DC Orders ED Discharge Orders     None         Elnora Morrison, MD 02/02/22 1605    Elnora Morrison, MD 02/02/22 [phone]

## 2022-02-02 NOTE — ED Triage Notes (Signed)
Patient here POV from Home.  Seen recently for Recurrent UTI and Recurrent Diarrhea. States Diarrhea has improved but is now having more generalized ABD Pain and Back Pain as well as the continued UTI. Requesting Infusion of [place]am.  NAD Noted during Triage. A&Ox4. GCS 15. BIB Wheelchair.

## 2022-02-02 NOTE — ED Notes (Signed)
Patient transported to CT 

## 2022-02-03 DIAGNOSIS — K7689 Other specified diseases of liver: Secondary | ICD-10-CM | POA: Diagnosis not present

## 2022-02-03 DIAGNOSIS — G9332 Myalgic encephalomyelitis/chronic fatigue syndrome: Secondary | ICD-10-CM | POA: Insufficient documentation

## 2022-02-03 DIAGNOSIS — D72829 Elevated white blood cell count, unspecified: Secondary | ICD-10-CM | POA: Diagnosis not present

## 2022-02-03 DIAGNOSIS — Q796 Ehlers-Danlos syndrome, unspecified: Secondary | ICD-10-CM | POA: Diagnosis not present

## 2022-02-03 DIAGNOSIS — R109 Unspecified abdominal pain: Secondary | ICD-10-CM | POA: Diagnosis not present

## 2022-02-03 DIAGNOSIS — D472 Monoclonal gammopathy: Secondary | ICD-10-CM | POA: Diagnosis not present

## 2022-02-03 DIAGNOSIS — R0789 Other chest pain: Secondary | ICD-10-CM | POA: Diagnosis not present

## 2022-02-03 DIAGNOSIS — R197 Diarrhea, unspecified: Secondary | ICD-10-CM | POA: Diagnosis not present

## 2022-02-03 DIAGNOSIS — R112 Nausea with vomiting, unspecified: Secondary | ICD-10-CM | POA: Diagnosis not present

## 2022-02-03 DIAGNOSIS — G90A Postural orthostatic tachycardia syndrome (POTS): Secondary | ICD-10-CM | POA: Diagnosis not present

## 2022-02-03 DIAGNOSIS — R519 Headache, unspecified: Secondary | ICD-10-CM | POA: Diagnosis not present

## 2022-02-03 DIAGNOSIS — R072 Precordial pain: Secondary | ICD-10-CM | POA: Diagnosis not present

## 2022-02-03 DIAGNOSIS — M8508 Fibrous dysplasia (monostotic), other site: Secondary | ICD-10-CM | POA: Diagnosis not present

## 2022-02-03 DIAGNOSIS — M797 Fibromyalgia: Secondary | ICD-10-CM | POA: Diagnosis not present

## 2022-02-03 DIAGNOSIS — R079 Chest pain, unspecified: Secondary | ICD-10-CM | POA: Diagnosis not present

## 2022-02-03 LAB — URINE CULTURE: Culture: NO GROWTH

## 2022-02-04 DIAGNOSIS — R519 Headache, unspecified: Secondary | ICD-10-CM | POA: Diagnosis not present

## 2022-02-04 DIAGNOSIS — M8508 Fibrous dysplasia (monostotic), other site: Secondary | ICD-10-CM | POA: Diagnosis not present

## 2022-02-04 DIAGNOSIS — R079 Chest pain, unspecified: Secondary | ICD-10-CM | POA: Diagnosis not present

## 2022-02-04 DIAGNOSIS — Q796 Ehlers-Danlos syndrome, unspecified: Secondary | ICD-10-CM | POA: Diagnosis not present

## 2022-02-04 DIAGNOSIS — K7689 Other specified diseases of liver: Secondary | ICD-10-CM | POA: Diagnosis not present

## 2022-02-10 ENCOUNTER — Other Ambulatory Visit: Payer: Self-pay | Admitting: Allergy

## 2022-02-14 ENCOUNTER — Encounter: Payer: Self-pay | Admitting: Cardiology

## 2022-02-16 DIAGNOSIS — D839 Common variable immunodeficiency, unspecified: Secondary | ICD-10-CM | POA: Diagnosis not present

## 2022-02-16 DIAGNOSIS — D472 Monoclonal gammopathy: Secondary | ICD-10-CM | POA: Diagnosis not present

## 2022-02-18 ENCOUNTER — Other Ambulatory Visit: Payer: Self-pay | Admitting: General Surgery

## 2022-02-18 DIAGNOSIS — Z9189 Other specified personal risk factors, not elsewhere classified: Secondary | ICD-10-CM

## 2022-02-21 ENCOUNTER — Other Ambulatory Visit (HOSPITAL_COMMUNITY): Payer: Self-pay | Admitting: *Deleted

## 2022-02-21 ENCOUNTER — Encounter (HOSPITAL_COMMUNITY): Payer: Self-pay

## 2022-02-21 DIAGNOSIS — Z01810 Encounter for preprocedural cardiovascular examination: Secondary | ICD-10-CM

## 2022-02-24 ENCOUNTER — Telehealth (HOSPITAL_COMMUNITY): Payer: Self-pay | Admitting: Emergency Medicine

## 2022-02-24 NOTE — Telephone Encounter (Signed)
Attempted to call patient regarding upcoming cardiac MR appointment. Left message on voicemail with name and callback number Frona Yost RN Navigator Cardiac Imaging [facility] [phone] Office [phone] Cell  

## 2022-02-28 ENCOUNTER — Other Ambulatory Visit: Payer: Self-pay | Admitting: Cardiology

## 2022-02-28 ENCOUNTER — Ambulatory Visit (HOSPITAL_COMMUNITY)
Admission: RE | Admit: 2022-02-28 | Discharge: 2022-02-28 | Disposition: A | Discharge status: Home / Self Care | Payer: BC Managed Care – PPO | Source: Ambulatory Visit

## 2022-02-28 ENCOUNTER — Encounter: Payer: Self-pay | Admitting: Cardiology

## 2022-02-28 ENCOUNTER — Ambulatory Visit (HOSPITAL_COMMUNITY)
Admission: RE | Admit: 2022-02-28 | Discharge: 2022-02-28 | Disposition: A | Discharge status: Home / Self Care | Payer: BC Managed Care – PPO | Source: Ambulatory Visit | Attending: Cardiology | Admitting: Cardiology

## 2022-02-28 DIAGNOSIS — R079 Chest pain, unspecified: Secondary | ICD-10-CM | POA: Diagnosis not present

## 2022-02-28 DIAGNOSIS — C9000 Multiple myeloma not having achieved remission: Secondary | ICD-10-CM | POA: Insufficient documentation

## 2022-02-28 DIAGNOSIS — Z01810 Encounter for preprocedural cardiovascular examination: Secondary | ICD-10-CM | POA: Insufficient documentation

## 2022-02-28 MED ORDER — GADOBUTROL 1 MMOL/ML IV SOLN
10.0000 mL | Freq: Once | INTRAVENOUS | Status: AC | PRN
  Administered 2022-02-28: 10 mL via INTRAVENOUS

## 2022-02-28 MED ORDER — REGADENOSON 0.4 MG/5ML IV SOLN
INTRAVENOUS | Status: AC
  Filled 2022-02-28: qty 5

## 2022-02-28 NOTE — Progress Notes (Signed)
Patient presents for stress MRI.  BP 111/71, HR 71.  History of asthma, but no wheezing on exam.  EKG today shows NSR, iRBBB   Shared Decision Making/Informed Consent The risks [chest pain, shortness of breath, cardiac arrhythmias, dizziness, blood pressure fluctuations, myocardial infarction, stroke/transient ischemic attack, nausea, vomiting, allergic reaction, and life-threatening complications (estimated to be 1 in 10,000)], benefits (risk stratification, diagnosing coronary artery disease, treatment guidance) and alternatives of a MRI stress test were discussed in detail with patient and they agree to proceed.  ADDENDUM: During study, she reported feeling short of breath after regadenoson administration.  No wheezing on exam and SpO2 stable in high 90s.  She was given albuterol inhaler with improvement.

## 2022-02-28 NOTE — Progress Notes (Signed)
Pt noted to be clearing her throat, states she's having an asthma attack, feels like mucous is building in her airway. Requesting to use her inhaler. Obtained permission from patient to take her locker key to obtain her home inhaler, pt used inhaler x 2 puffs with reported improvement. Permission obtained to proceed with remainder of stress test Marchia Bond RN Navigator Cardiac Imaging [facility] [phone] Office  [phone] Cell

## 2022-03-17 ENCOUNTER — Other Ambulatory Visit: Payer: Self-pay | Admitting: Allergy

## 2022-03-17 DIAGNOSIS — D839 Common variable immunodeficiency, unspecified: Secondary | ICD-10-CM | POA: Diagnosis not present

## 2022-03-23 ENCOUNTER — Other Ambulatory Visit (HOSPITAL_BASED_OUTPATIENT_CLINIC_OR_DEPARTMENT_OTHER): Payer: Self-pay

## 2022-03-26 ENCOUNTER — Ambulatory Visit
Admission: RE | Admit: 2022-03-26 | Discharge: 2022-03-26 | Disposition: A | Discharge status: Home / Self Care | Payer: BC Managed Care – PPO | Source: Ambulatory Visit | Attending: General Surgery | Admitting: General Surgery

## 2022-03-26 DIAGNOSIS — Z9189 Other specified personal risk factors, not elsewhere classified: Secondary | ICD-10-CM

## 2022-03-30 DIAGNOSIS — D472 Monoclonal gammopathy: Secondary | ICD-10-CM | POA: Diagnosis not present

## 2022-04-12 ENCOUNTER — Ambulatory Visit: Payer: BC Managed Care – PPO | Admitting: Cardiology

## 2022-04-18 DIAGNOSIS — D839 Common variable immunodeficiency, unspecified: Secondary | ICD-10-CM | POA: Diagnosis not present

## 2022-04-18 DIAGNOSIS — J4530 Mild persistent asthma, uncomplicated: Secondary | ICD-10-CM | POA: Diagnosis not present

## 2022-04-18 DIAGNOSIS — J301 Allergic rhinitis due to pollen: Secondary | ICD-10-CM | POA: Insufficient documentation

## 2022-04-18 DIAGNOSIS — D472 Monoclonal gammopathy: Secondary | ICD-10-CM | POA: Diagnosis not present

## 2022-04-18 DIAGNOSIS — G90A Postural orthostatic tachycardia syndrome (POTS): Secondary | ICD-10-CM | POA: Diagnosis not present

## 2022-04-19 DIAGNOSIS — Z1159 Encounter for screening for other viral diseases: Secondary | ICD-10-CM | POA: Diagnosis not present

## 2022-04-19 DIAGNOSIS — Z136 Encounter for screening for cardiovascular disorders: Secondary | ICD-10-CM | POA: Diagnosis not present

## 2022-04-19 DIAGNOSIS — Z131 Encounter for screening for diabetes mellitus: Secondary | ICD-10-CM | POA: Diagnosis not present

## 2022-04-19 DIAGNOSIS — E079 Disorder of thyroid, unspecified: Secondary | ICD-10-CM | POA: Diagnosis not present

## 2022-04-19 DIAGNOSIS — Z9189 Other specified personal risk factors, not elsewhere classified: Secondary | ICD-10-CM | POA: Insufficient documentation

## 2022-04-19 DIAGNOSIS — Z23 Encounter for immunization: Secondary | ICD-10-CM | POA: Diagnosis not present

## 2022-04-19 DIAGNOSIS — Z113 Encounter for screening for infections with a predominantly sexual mode of transmission: Secondary | ICD-10-CM | POA: Diagnosis not present

## 2022-04-19 DIAGNOSIS — Z1330 Encounter for screening examination for mental health and behavioral disorders, unspecified: Secondary | ICD-10-CM | POA: Diagnosis not present

## 2022-04-19 DIAGNOSIS — Z114 Encounter for screening for human immunodeficiency virus [HIV]: Secondary | ICD-10-CM | POA: Diagnosis not present

## 2022-04-19 DIAGNOSIS — Z1331 Encounter for screening for depression: Secondary | ICD-10-CM | POA: Diagnosis not present

## 2022-04-21 DIAGNOSIS — D839 Common variable immunodeficiency, unspecified: Secondary | ICD-10-CM | POA: Diagnosis not present

## 2022-05-12 DIAGNOSIS — N939 Abnormal uterine and vaginal bleeding, unspecified: Secondary | ICD-10-CM | POA: Diagnosis not present

## 2022-05-12 DIAGNOSIS — N926 Irregular menstruation, unspecified: Secondary | ICD-10-CM | POA: Diagnosis not present

## 2022-05-17 DIAGNOSIS — C9000 Multiple myeloma not having achieved remission: Secondary | ICD-10-CM | POA: Diagnosis not present

## 2022-05-17 DIAGNOSIS — E559 Vitamin D deficiency, unspecified: Secondary | ICD-10-CM | POA: Diagnosis not present

## 2022-05-17 DIAGNOSIS — N951 Menopausal and female climacteric states: Secondary | ICD-10-CM | POA: Diagnosis not present

## 2022-05-17 DIAGNOSIS — R5382 Chronic fatigue, unspecified: Secondary | ICD-10-CM | POA: Diagnosis not present

## 2022-05-17 DIAGNOSIS — Q7969 Other Ehlers-Danlos syndromes: Secondary | ICD-10-CM | POA: Diagnosis not present

## 2022-05-17 DIAGNOSIS — E039 Hypothyroidism, unspecified: Secondary | ICD-10-CM | POA: Diagnosis not present

## 2022-05-17 DIAGNOSIS — D849 Immunodeficiency, unspecified: Secondary | ICD-10-CM | POA: Diagnosis not present

## 2022-05-17 DIAGNOSIS — E538 Deficiency of other specified B group vitamins: Secondary | ICD-10-CM | POA: Diagnosis not present

## 2022-05-17 DIAGNOSIS — Z113 Encounter for screening for infections with a predominantly sexual mode of transmission: Secondary | ICD-10-CM | POA: Diagnosis not present

## 2022-05-19 DIAGNOSIS — D839 Common variable immunodeficiency, unspecified: Secondary | ICD-10-CM | POA: Diagnosis not present

## 2022-05-26 DIAGNOSIS — E559 Vitamin D deficiency, unspecified: Secondary | ICD-10-CM | POA: Diagnosis not present

## 2022-05-26 DIAGNOSIS — N951 Menopausal and female climacteric states: Secondary | ICD-10-CM | POA: Diagnosis not present

## 2022-05-26 DIAGNOSIS — E039 Hypothyroidism, unspecified: Secondary | ICD-10-CM | POA: Diagnosis not present

## 2022-05-26 DIAGNOSIS — Z0001 Encounter for general adult medical examination with abnormal findings: Secondary | ICD-10-CM | POA: Diagnosis not present

## 2022-06-07 DIAGNOSIS — A539 Syphilis, unspecified: Secondary | ICD-10-CM | POA: Diagnosis not present

## 2022-06-07 DIAGNOSIS — N852 Hypertrophy of uterus: Secondary | ICD-10-CM | POA: Diagnosis not present

## 2022-06-08 ENCOUNTER — Telehealth: Payer: Self-pay | Admitting: Gastroenterology

## 2022-06-08 NOTE — Telephone Encounter (Signed)
Inbound call from patient requesting a appointment with Dr. Bryan Lemma to discuss her recurring issues hemorrhoids. Patient was scheduled for 4/18 at 1:40 but is requesting a call to discuss if she can be seen with Dr. Bryan Lemma sooner. Please advise.

## 2022-06-09 NOTE — Telephone Encounter (Signed)
Called and left patient a detailed vm to let her know that I checked Dr. Vivia Ewing schedule this morning and unfortunately there are no sooner appts. I advised pt that she has the soonest appt, but she can call back periodically to see if there have been any cancellations. I advised patient that if her symptoms need attention prior to her scheduled appt she should reach out to PCP or urgent care.

## 2022-06-14 DIAGNOSIS — M25561 Pain in right knee: Secondary | ICD-10-CM | POA: Insufficient documentation

## 2022-06-16 DIAGNOSIS — D839 Common variable immunodeficiency, unspecified: Secondary | ICD-10-CM | POA: Diagnosis not present

## 2022-06-18 ENCOUNTER — Ambulatory Visit
Admission: RE | Admit: 2022-06-18 | Discharge: 2022-06-18 | Disposition: A | Discharge status: Home / Self Care | Payer: BC Managed Care – PPO | Source: Ambulatory Visit | Attending: General Surgery | Admitting: General Surgery

## 2022-06-18 DIAGNOSIS — Z1239 Encounter for other screening for malignant neoplasm of breast: Secondary | ICD-10-CM | POA: Diagnosis not present

## 2022-06-18 MED ORDER — GADOPICLENOL 0.5 MMOL/ML IV SOLN
7.0000 mL | Freq: Once | INTRAVENOUS | Status: AC | PRN
  Administered 2022-06-18: 7 mL via INTRAVENOUS

## 2022-06-22 DIAGNOSIS — D472 Monoclonal gammopathy: Secondary | ICD-10-CM | POA: Diagnosis not present

## 2022-06-23 DIAGNOSIS — N852 Hypertrophy of uterus: Secondary | ICD-10-CM | POA: Diagnosis not present

## 2022-06-23 DIAGNOSIS — D259 Leiomyoma of uterus, unspecified: Secondary | ICD-10-CM | POA: Diagnosis not present

## 2022-06-24 ENCOUNTER — Ambulatory Visit: Payer: BC Managed Care – PPO | Admitting: Allergy

## 2022-06-24 ENCOUNTER — Other Ambulatory Visit: Payer: Self-pay

## 2022-06-24 ENCOUNTER — Encounter: Payer: Self-pay | Admitting: Allergy

## 2022-06-24 VITALS — BP 110/80 | HR 82 | Temp 98.3°F | Resp 16 | Ht 66.0 in | Wt 144.9 lb

## 2022-06-24 DIAGNOSIS — H1013 Acute atopic conjunctivitis, bilateral: Secondary | ICD-10-CM | POA: Diagnosis not present

## 2022-06-24 DIAGNOSIS — J3089 Other allergic rhinitis: Secondary | ICD-10-CM

## 2022-06-24 NOTE — Progress Notes (Unsigned)
Follow-up Note  RE: Lindsey [name] MRN: [id number] DOB: Feb 16, 1966 Date of Office Visit: 06/24/2022   History of present illness: Lindsey [name] is a 57 y.o. female presenting today for follow-up of allergic rhinitis with conjunctivitis, asthma, contact dermatitis.  She also has had an elevated IgA as well as EDS/POTS.  She was last seen in the office on 08/12/21 by myself.  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  She is now on gammunexC monthly infusions and tolerating well. She is getting the infusions done at home with a nurse.  She does report she is feeling overall better and has less fatigue.   She is still having fainting episodes that has a unknown cause at this time.  She has had a cardiac work-up that was unremarkable.  She states she can't tell when she is about to faint; it just comes on randomly.  She states once she was doing down the stairs and just collapsed and believes she injured her meniscus which she is seeing someone for this injury.   For the smoldering myeloma at this time reports it is being monitored and she has routine labs that are being followed.  No treatment at this time.   She does states Dr Posey Pronto recommended she get back on allergy shots to help with control of her environmental allergies. She does still report having symptoms of nasal congestion/drainage, sneezing, itchy eyes.  She was on allergy shots before from 2019- 2020 with [place].  Testing then was positive to pollens (trees and weeds).   She is taking allegra and xyzal daily as well as singulair.  Which all help with symptom control.  She has albuterol that she uses as needed but denies frequent use.  She has not had any ED/UC visits for her breathing nor required systemic steroid for her breathing.  In regards to immunotherapy she has inquired with her  home nurse that monitors her infusions if she would be able to provide other injections like allergy shots and sounds like this could be doable.     Review of systems: Review of Systems  Constitutional: Negative.   HENT:         See HPI  Eyes:        See HPI  Respiratory: Negative.    Cardiovascular: Negative.   Gastrointestinal: Negative.   Musculoskeletal: Negative.   Skin: Negative.   Allergic/Immunologic: Negative.   Neurological:  Positive for syncope.     All other systems negative unless noted above in HPI  Past medical/social/surgical/family history have been reviewed and are unchanged unless specifically indicated below.  No changes  Medication List: Current Outpatient Medications  Medication Sig Dispense Refill   Acetaminophen (TYLENOL 8 HOUR ARTHRITIS PAIN PO) Take 1,300 mg by mouth in the morning and at bedtime.     Albuterol Sulfate (PROAIR RESPICLICK) 123XX123 (90 Base) MCG/ACT AEPB Inhale into the lungs as needed.     amitriptyline (ELAVIL) 25 MG tablet Take 25 mg by mouth at bedtime.     Ascorbic Acid (VITAMIN C PO) Take 1,200 mg by mouth 2 (two) times daily.      Cholecalciferol (VITAMIN D3) 1.25 MG (50000 UT) TABS Take 1 tablet by mouth once a week.     CLINPRO 5000 1.1 % PSTE Place onto teeth.     clonazePAM (  KLONOPIN) 0.5 MG tablet Take 1 tablet (0.5 mg total) by mouth at bedtime. 30 tablet 1   cycloSPORINE (RESTASIS) 0.05 % ophthalmic emulsion Place 1 drop into both eyes 2 (two) times daily.     EPINEPHrine 0.3 mg/0.3 mL IJ SOAJ injection Inject 0.3 mg into the muscle as needed for anaphylaxis. 1 each 2   estradiol (VIVELLE-DOT) 0.05 MG/24HR patch SMARTSIG:1 Patch(s) T-DERMAL Every 3 Days     estradiol (VIVELLE-DOT) 0.075 MG/24HR Place onto the skin every 3 (three) days.     fexofenadine (ALLEGRA) 180 MG tablet TAKE 1 TABLET BY MOUTH EVERY DAY 90 tablet 1   gabapentin (NEURONTIN) 300 MG capsule Take 300 mg by mouth at bedtime.     hyoscyamine (LEVBID) 0.375 MG  12 hr tablet Take 0.375 mg by mouth 2 (two) times daily.     levocetirizine (XYZAL) 5 MG tablet TAKE 1 TABLET BY MOUTH EVERYDAY AT BEDTIME 90 tablet 1   methocarbamol (ROBAXIN) 750 MG tablet Take 750 mg by mouth daily.     montelukast (SINGULAIR) 10 MG tablet TAKE 1 TABLET BY MOUTH EVERYDAY AT BEDTIME 90 tablet 1   OVER THE COUNTER MEDICATION 306 mg every morning. Estrogen control     OVER THE COUNTER MEDICATION 2 capsules every morning. MTHFR Endure (RIH)     Pregnenolone Micronized (PREGNENOLONE PO) Take 75 mg by mouth 3 (three) times a week.     Testosterone 75 MG PLLT 75 mg every 3 (three) months. Insert 1 pellet every 3 months     traZODone (DESYREL) 100 MG tablet trazodone 100 mg tablet  TAKE 1-2 TABLET BY MOUTH AT BEDTIME     No current facility-administered medications for this visit.     Known medication allergies: Allergies  Allergen Reactions   Poison Oak Extract [Poison Oak Extract] Anaphylaxis   Xiidra [Lifitegrast] Swelling    Eye swelling and blurred vision.    Penicillins Hives   Sulfa Antibiotics Nausea And Vomiting   Sulfamethoxazole-Trimethoprim      Physical examination: Blood pressure 110/80, pulse 82, temperature 98.3 F (36.8 C), resp. rate 16, height 5\' 6"  (1.676 m), weight 144 lb 14.4 oz (65.7 kg), last menstrual period 04/08/2014, SpO2 98 %.  General: Alert, interactive, in no acute distress. HEENT: PERRLA, TMs pearly gray, turbinates minimally edematous without discharge, post-pharynx non erythematous. Neck: Supple without lymphadenopathy. Lungs: Clear to auscultation without wheezing, rhonchi or rales. {no increased work of breathing. CV: Normal S1, S2 without murmurs. Abdomen: Nondistended, nontender. Skin: Warm and dry, without lesions or rashes. Extremities:  No clubbing, cyanosis or edema. Neuro:   Grossly intact.  Diagnositics/Labs: None today  Assessment and plan:   Allergic rhinitis with conjunctivitis Asthma Contact  dermatitis Elevated IgA --> diagnosed and being monitored for smoldering myeloma Low IgG, IgM with poor vaccine response --> diagnosed with CVID now on IgG replacement therapy managed by Dr Posey Pronto [facility]) Ehlers-Danlos syndrome, POTS   -Continue daily Singulair -Continue Allegra and Xyzal daily -Continue to have access to epinephrine device.   -Have access to albuterol inhaler 2 puffs every 4-6 hours as needed for cough/wheeze/shortness of breath/chest tightness.  May use 15-20 minutes prior to activity.   Monitor frequency of use.   -Will obtain environmental allergy panel at this time to see what she is still allergic to in environment.   -Discussed option of restarting immunotherapy based on updated testing.   Will call with results and will discuss the option(s) for resuming allergy shots  -She will continue to  follow with heme/onc for smoldering myeloma management -She will continue to follow with immunology for IgG replacement therapy  Follow-up for routine visit in about 6 months or sooner if needed  I appreciate the opportunity to take part in Charlotte's care. Please do not hesitate to contact me with questions.  Sincerely,   Prudy Feeler, MD Allergy/Immunology [facility]

## 2022-06-28 NOTE — Patient Instructions (Addendum)
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   -Have access to albuterol inhaler 2 puffs every 4-6 hours as needed for cough/wheeze/shortness of breath/chest tightness.  May use 15-20 minutes prior to activity.   Monitor frequency of use.   -Will obtain environmental allergy panel at this time to see what she is still allergic to in environment.   -Discussed option of restarting immunotherapy based on updated testing.   Will call with results and will discuss the option(s) for resuming allergy shots  -She will continue to follow with heme/onc for smoldering myeloma management -She will continue to follow with immunology for IgG replacement therapy  Follow-up for routine visit in about 6 months or sooner if needed

## 2022-06-29 DIAGNOSIS — M25561 Pain in right knee: Secondary | ICD-10-CM | POA: Diagnosis not present

## 2022-06-29 DIAGNOSIS — M6281 Muscle weakness (generalized): Secondary | ICD-10-CM | POA: Diagnosis not present

## 2022-06-29 LAB — ALLERGENS W/TOTAL IGE AREA 2

## 2022-06-30 DIAGNOSIS — M6281 Muscle weakness (generalized): Secondary | ICD-10-CM | POA: Insufficient documentation

## 2022-07-01 ENCOUNTER — Other Ambulatory Visit (HOSPITAL_BASED_OUTPATIENT_CLINIC_OR_DEPARTMENT_OTHER): Payer: Self-pay

## 2022-07-01 DIAGNOSIS — E271 Primary adrenocortical insufficiency: Secondary | ICD-10-CM | POA: Insufficient documentation

## 2022-07-01 MED ORDER — FLUDROCORTISONE ACETATE 0.1 MG PO TABS
0.1000 mg | ORAL_TABLET | Freq: Two times a day (BID) | ORAL | 4 refills | Status: DC
  Filled 2022-07-01: qty 60, 30d supply, fill #0

## 2022-07-06 DIAGNOSIS — C9000 Multiple myeloma not having achieved remission: Secondary | ICD-10-CM | POA: Diagnosis not present

## 2022-07-06 DIAGNOSIS — R5382 Chronic fatigue, unspecified: Secondary | ICD-10-CM | POA: Diagnosis not present

## 2022-07-06 DIAGNOSIS — M797 Fibromyalgia: Secondary | ICD-10-CM | POA: Diagnosis not present

## 2022-07-06 DIAGNOSIS — Q796 Ehlers-Danlos syndrome, unspecified: Secondary | ICD-10-CM | POA: Diagnosis not present

## 2022-07-07 DIAGNOSIS — D839 Common variable immunodeficiency, unspecified: Secondary | ICD-10-CM | POA: Diagnosis not present

## 2022-07-14 ENCOUNTER — Other Ambulatory Visit: Payer: Self-pay

## 2022-07-21 ENCOUNTER — Telehealth: Payer: Self-pay

## 2022-07-21 NOTE — Telephone Encounter (Signed)
Patient called in - DOB verified - stated she received lab letter and had already spoken to someone regarding her lab results - requested to be remove from the call list due to receiving multiple calls then hung up!

## 2022-07-27 DIAGNOSIS — D839 Common variable immunodeficiency, unspecified: Secondary | ICD-10-CM | POA: Diagnosis not present

## 2022-07-28 ENCOUNTER — Encounter: Payer: Self-pay | Admitting: Gastroenterology

## 2022-07-28 ENCOUNTER — Ambulatory Visit (INDEPENDENT_AMBULATORY_CARE_PROVIDER_SITE_OTHER): Payer: BC Managed Care – PPO | Admitting: Gastroenterology

## 2022-07-28 ENCOUNTER — Other Ambulatory Visit (HOSPITAL_BASED_OUTPATIENT_CLINIC_OR_DEPARTMENT_OTHER): Payer: Self-pay

## 2022-07-28 VITALS — BP 126/82 | HR 88 | Ht 66.0 in | Wt 145.0 lb

## 2022-07-28 DIAGNOSIS — R140 Abdominal distension (gaseous): Secondary | ICD-10-CM

## 2022-07-28 DIAGNOSIS — D839 Common variable immunodeficiency, unspecified: Secondary | ICD-10-CM

## 2022-07-28 DIAGNOSIS — K6289 Other specified diseases of anus and rectum: Secondary | ICD-10-CM | POA: Diagnosis not present

## 2022-07-28 DIAGNOSIS — K580 Irritable bowel syndrome with diarrhea: Secondary | ICD-10-CM | POA: Diagnosis not present

## 2022-07-28 DIAGNOSIS — R109 Unspecified abdominal pain: Secondary | ICD-10-CM

## 2022-07-28 MED ORDER — IBGARD 90 MG PO CPCR
2.0000 | ORAL_CAPSULE | Freq: Two times a day (BID) | ORAL | 1 refills | Status: DC
  Filled 2022-07-28: qty 180, fill #0

## 2022-07-28 MED ORDER — HYOSCYAMINE SULFATE ER 0.375 MG PO TB12
0.3750 mg | ORAL_TABLET | Freq: Two times a day (BID) | ORAL | 5 refills | Status: DC
  Filled 2022-07-28 – 2022-08-25 (×2): qty 180, 90d supply, fill #0

## 2022-07-28 NOTE — Progress Notes (Signed)
Chief Complaint:    IBS  GI History:  57 year old female with a history of fibromyalgia, carpal tunnel syndrome, arthritis, Ehlers-Danlos syndrome, IBS, asthma, smoldering myeloma/MGUS, C VID.  IBS: Longstanding history of IBS-D dating back 20+ years.  Symptoms had been generally well controlled until acute onset abdominal cramping, diarrhea in 06/2020.  Did have right arm surgery on 07/08/2020 and took vancomycin perioperatively.  Has been having ongoing, intermittent watery, nonbloody stools since then. - 07/11/2020: ER evaluation: Normal CBC, CMP.  CT with mild wall thickening throughout the colon mostly distal to hepatic flexure, 4.5 cm uterine fibroid.  Discharged with Cipro/Flagyl - 07/20/2020: GI evaluation.  Negative GI PCR panel.  Trialed course of Bentyl and Imodium - 10/27/2020: Colonoscopy: Normal colon (biopsies negative for MC, IBD), grade 1 internal hemorrhoids.  Normal TI.  Repeat 10 years - 11/09/2020: GI follow-up.  Trialed course of FD guard for abdominal cramping/dyspepsia.  Diarrhea resolved at that time. - 05/24/2021: ER evaluation for diarrhea.  Reportedly ongoing symptoms despite 24 (2 mg) tablets of Imodium in 24 hours.  Improved with IV fluids and discharged home with Rx for Bentyl, Levsin - 06/08/2021: ER evaluation for diarrhea and abdominal pain.  HD stable, benign exam.  Lipase 114, otherwise normal CMP, CBC.  C. difficile and GI PCR panel negative.  CT with thickening of entire colon concerning for colitis.  Normal pancreas.  Treated with IVF, Bentyl, IV droperidol, GI cocktail with improvement and discharged home with Phenergan. - 06/24/2021: GI follow-up.  Ongoing diarrhea and lower abdominal pain.  Intermittent nausea/vomiting.  Normal/negative fecal pancreatic elastase, Giardia, fecal calprotectin, ESR, CRP, 5 HIAA, TTG, VIP, fasting gastrin, somatostatin, calcitonin.  IgA elevated at 1068 (declined referral to Rheumatology as she has been seen before and feels elevated IgA  related to Ehlers-Danlos).  Repeat lipase normal. - 07/01/2021: EGD small HH, nonobstructing Schatzki's ring, otherwise normal including normal gastric/duodenal biopsies - 07/05/2021: ER evaluation for diarrhea and abdominal pain.  Unremarkable work-up to include CBC, CMP.  Lipase 54. - 07/15/2021: RUQ [place]: GB stones measuring up to 4 mm without cholecystitis.  No duct dilation.  At least 2 hepatic cysts with thin internal septations measuring up to 2.1 cm. - 08/05/2021: GI follow-up.  Diarrhea and abdominal pain improved since starting Xifaxan and hyoscyamine.  Was using Lomotil prn.     Follows at [facility] and takes several vitamins/supplements.   History of symptomatic hemorrhoids. - 12/04/2020: Banding of RA hemorrhoid - 01/05/2021: Banding of LL hemorrhoid - 03/02/2021: Banding of RP hemorrhoid  HPI:     Patient is a 57 y.o. female presenting to the Gastroenterology Clinic for follow-up.  Was seen by me on 08/05/2021 as outlined above.  Since last appointment, her diarrhea has essentially resolved since stopping some of her supplements, to include high-dose magnesium, that she was getting through her [facility] clinic.  Levbid is effective for abdominal cramping and gas/bloating, but she was told this is not covered by her insurance, although she has received this prescription in the past via [facility].  Separately, reports episode of fecal impaction at the end of March, requiring enema to resolve.  No recurrence of symptoms.  She thinks this may have been due to high amounts of antihistamine agents, and has since made some medication adjustments.  Requesting rectal exam for evaluation and reassurance.  More recently, she has been following with the Hematology clinic (Myeloma clinic) at [facility], diagnosed with MGUS/smoldering myeloma with plan for observation.  Bone marrow biopsy  negative.  Also following in the [facility] clinic and diagnosed with CVID, treated with  IVIG.    Review of systems:     No chest pain, no SOB, no fevers, no urinary sx   Past Medical History:  Diagnosis Date   Allergy    Angio-edema    Arthritis    Asthma    Carpal tunnel syndrome 07/07/2015   right   Chronic cough 12/02/2015   Chronic pain syndrome 08/10/2016   CVID (common variable immunodeficiency)    Ehlers-Danlos syndrome    Essential alopecia of women 04/14/2011   Family history of breast cancer in female 08/26/2015   (x2) paternal 1st cousins dx in their 48s    Fibrocystic breast changes of both breasts 07/19/2015   Fibromyalgia    HLD (hyperlipidemia)    Hypermobility syndrome 11/26/2014   Based on Fam Hx and Beighton Score of 7 this is likely ED III    Hypertrophy of vulva 07/20/2017   Inflammatory dermatosis 04/14/2011   Irritable larynx syndrome 12/02/2015   Lumbar radiculopathy 02/16/2016   MGUS (monoclonal gammopathy of unknown significance)    Osteoarthritis 04/01/2017   Osteopenia    Primary arthrosis of first carpometacarpal joints, bilateral 06/01/2015   Sacroiliac joint dysfunction of right side 11/26/2014   B SI joint xrays last done at [facility] Dr. Deanne Coffer 03/13/2017   Skin cancer     Patient's surgical history, family medical history, social history, medications and allergies were all reviewed in Epic    Current Outpatient Medications  Medication Sig Dispense Refill   Acetaminophen (TYLENOL 8 HOUR ARTHRITIS PAIN PO) Take 1,300 mg by mouth in the morning and at bedtime.     Albuterol Sulfate (PROAIR RESPICLICK) 108 (90 Base) MCG/ACT AEPB Inhale into the lungs as needed.     amitriptyline (ELAVIL) 25 MG tablet Take 25 mg by mouth at bedtime.     Ascorbic Acid (VITAMIN C PO) Take 3,000 mg by mouth 2 (two) times daily.     CLINPRO 5000 1.1 % PSTE Place onto teeth.     clonazePAM (KLONOPIN) 0.5 MG tablet Take 1 tablet (0.5 mg total) by mouth at bedtime. 30 tablet 1   cycloSPORINE (RESTASIS) 0.05 % ophthalmic emulsion Place 1 drop  into both eyes 2 (two) times daily.     EPINEPHrine 0.3 mg/0.3 mL IJ SOAJ injection Inject 0.3 mg into the muscle as needed for anaphylaxis. 1 each 2   fexofenadine (ALLEGRA) 180 MG tablet TAKE 1 TABLET BY MOUTH EVERY DAY 90 tablet 1   fludrocortisone (FLORINEF) 0.1 MG tablet Take 1 tablet (0.1 mg total) by mouth 2 (two) times daily. 60 tablet 4   gabapentin (NEURONTIN) 300 MG capsule Take 300 mg by mouth at bedtime.     hyoscyamine (LEVBID) 0.375 MG 12 hr tablet Take 0.375 mg by mouth 2 (two) times daily.     methocarbamol (ROBAXIN) 750 MG tablet Take 750 mg by mouth daily.     montelukast (SINGULAIR) 10 MG tablet TAKE 1 TABLET BY MOUTH EVERYDAY AT BEDTIME 90 tablet 1   Testosterone 75 MG PLLT 87.5 mg every 3 (three) months. Insert 1 pellet every 3 months     traZODone (DESYREL) 100 MG tablet trazodone 100 mg tablet  TAKE 1-2 TABLET BY MOUTH AT BEDTIME     Cholecalciferol (VITAMIN D3) 1.25 MG (50000 UT) TABS Take 1 tablet by mouth once a week.     estradiol (VIVELLE-DOT) 0.05 MG/24HR patch SMARTSIG:1 Patch(s) T-DERMAL Every 3 Days  estradiol (VIVELLE-DOT) 0.075 MG/24HR Place onto the skin every 3 (three) days.     levocetirizine (XYZAL) 5 MG tablet TAKE 1 TABLET BY MOUTH EVERYDAY AT BEDTIME 90 tablet 1   OVER THE COUNTER MEDICATION 306 mg every morning. Estrogen control     OVER THE COUNTER MEDICATION 2 capsules every morning. MTHFR Endure (RIH)     Pregnenolone Micronized (PREGNENOLONE PO) Take 75 mg by mouth 3 (three) times a week.     No current facility-administered medications for this visit.    Physical Exam:     BP 126/82   Pulse 88   Ht  (1.676 m)   Wt 145 lb (65.8 kg)   LMP 04/08/2014   BMI 23.40 kg/m   GENERAL:  Pleasant female in NAD PSYCH: : Cooperative, normal affect EENT:  conjunctiva pink, mucous membranes moist, neck supple without masses CARDIAC:  RRR, no murmur heard, no peripheral edema PULM: Normal respiratory effort, lungs CTA bilaterally, no  wheezing NEURO: Alert and oriented x 3, no focal neurologic deficits Rectal exam: Sensation intact and preserved anal wink. No external anal fissures, external hemorrhoids or skin tags. Normal sphincter tone. No palpable mass. No blood on the exam glove.  Anoscopy essentially normal with appropriately scarred hemorrhoids consistent with previous banding.  (Chaperone: Ailene Rud, CMA).     IMPRESSION and PLAN:    1) IBS-D 2) Abdominal cramping/abdominal spasm 3) Abdominal bloating Diarrhea improved after modification of some of her supplements.  I am curious as to what degree the IVIG may have also played a role.  It is conceivable that she has some degree of overlapping CVID involving her small bowel, which could be in the mid or distal small bowel and outside reach of standard endoscope/ileoscopy.  - Requesting refill of Levbid.  Bentyl has not been F issues in the past - Refill placed for Levbid - Recommend starting IBgard - Recommended using extreme caution with supplements being prescribed by her integrative health provider  4) Rectal pain Single episode of perceived fecal impaction, cleared with enema and no recurrence.  Rectal exam otherwise unremarkable today.  Had colonoscopy in 10/2020 with normal-appearing rectal vault. - Reassurance provided  5) CVID Diagnosed with CVID at [facility] clinic and started on IVIG.  Discussed potential GI involvement      RTC in 12 months or sooner as needed  I spent over 40 minutes of time, including in depth chart review, independent review of results as outlined above, communicating results with the patient directly, face-to-face time with the patient, coordinating care, ordering studies and medications as appropriate, and documentation.      Shellia Cleverly ,DO, FACG 07/28/2022, 1:47 PM

## 2022-07-28 NOTE — Patient Instructions (Addendum)
We have sent the following medications to your pharmacy for you to pick up at your convenience:  Hyoscyamine 0.375 MG  Take IB guard from over the counter.   _______________________________________________________  If your blood pressure at your visit was 140/90 or greater, please contact your primary care physician to follow up on this.  _______________________________________________________  If you are age 55 or younger, your body mass index should be between 19-25. Your Body mass index is 23.4 kg/m. If this is out of the aformentioned range listed, please consider follow up with your Primary Care Provider.   __________________________________________________________  The [facility] providers would like to encourage you to use [facility] to communicate with providers for non-urgent requests or questions.  Due to long hold times on the telephone, sending your provider a message by [facility] may be a faster and more efficient way to get a response.  Please allow 48 business hours for a response.  Please remember that this is for non-urgent requests.   Due to recent changes in healthcare laws, you may see the results of your imaging and laboratory studies on MyChart before your provider has had a chance to review them.  We understand that in some cases there may be results that are confusing or concerning to you. Not all laboratory results come back in the same time frame and the provider may be waiting for multiple results in order to interpret others.  Please give [place] 48 hours in order for your provider to thoroughly review all the results before contacting the office for clarification of your results.    Thank you for choosing me and [facility].  Vito Cirigliano, D.O.

## 2022-07-29 ENCOUNTER — Other Ambulatory Visit (HOSPITAL_BASED_OUTPATIENT_CLINIC_OR_DEPARTMENT_OTHER): Payer: Self-pay

## 2022-08-01 ENCOUNTER — Other Ambulatory Visit (HOSPITAL_BASED_OUTPATIENT_CLINIC_OR_DEPARTMENT_OTHER): Payer: Self-pay

## 2022-08-02 ENCOUNTER — Other Ambulatory Visit (HOSPITAL_BASED_OUTPATIENT_CLINIC_OR_DEPARTMENT_OTHER): Payer: Self-pay

## 2022-08-03 ENCOUNTER — Other Ambulatory Visit (HOSPITAL_BASED_OUTPATIENT_CLINIC_OR_DEPARTMENT_OTHER): Payer: Self-pay

## 2022-08-04 ENCOUNTER — Other Ambulatory Visit (HOSPITAL_BASED_OUTPATIENT_CLINIC_OR_DEPARTMENT_OTHER): Payer: Self-pay

## 2022-08-04 ENCOUNTER — Encounter: Payer: Self-pay | Admitting: Gastroenterology

## 2022-08-04 MED ORDER — IBGARD 90 MG PO CPCR: 1.0000 | ORAL_CAPSULE | Freq: Two times a day (BID) | ORAL | 3 refills | Status: DC

## 2022-08-12 ENCOUNTER — Other Ambulatory Visit (HOSPITAL_COMMUNITY): Payer: Self-pay

## 2022-08-12 NOTE — Telephone Encounter (Signed)
Medication is not covered- No PA required. Medication is not approved by the FDA

## 2022-08-13 ENCOUNTER — Other Ambulatory Visit: Payer: Self-pay | Admitting: Allergy

## 2022-08-19 DIAGNOSIS — D839 Common variable immunodeficiency, unspecified: Secondary | ICD-10-CM | POA: Diagnosis not present

## 2022-08-25 ENCOUNTER — Telehealth: Payer: Self-pay | Admitting: Pharmacy Technician

## 2022-08-25 ENCOUNTER — Other Ambulatory Visit (HOSPITAL_BASED_OUTPATIENT_CLINIC_OR_DEPARTMENT_OTHER): Payer: Self-pay

## 2022-08-25 ENCOUNTER — Other Ambulatory Visit (HOSPITAL_COMMUNITY): Payer: Self-pay

## 2022-08-25 NOTE — Telephone Encounter (Signed)
PA has been submitted EXPEDITED, and telephone encounter has been created.  

## 2022-08-25 NOTE — Telephone Encounter (Signed)
Patient Advocate Encounter  Received notification from [employer] that prior authorization for HYOSCYAMINE 0.375MG  is required.   PA submitted on 5.16.24 [facility] Status is pending

## 2022-08-26 NOTE — Telephone Encounter (Signed)
Pharmacy Patient Advocate Encounter  Received notification from [employer] that the request for prior authorization for [facility] 0.375MG  has been denied due to     Please be advised we currently do not have a Pharmacist to review denials, therefore you will need to process appeals accordingly as needed. Thanks for your support at this time.   You may call [phone] to appeal.

## 2022-08-29 ENCOUNTER — Other Ambulatory Visit (HOSPITAL_BASED_OUTPATIENT_CLINIC_OR_DEPARTMENT_OTHER): Payer: Self-pay

## 2022-08-29 DIAGNOSIS — Q796 Ehlers-Danlos syndrome, unspecified: Secondary | ICD-10-CM | POA: Diagnosis not present

## 2022-08-29 DIAGNOSIS — G90A Postural orthostatic tachycardia syndrome (POTS): Secondary | ICD-10-CM | POA: Diagnosis not present

## 2022-08-29 DIAGNOSIS — Z880 Allergy status to penicillin: Secondary | ICD-10-CM | POA: Insufficient documentation

## 2022-08-29 DIAGNOSIS — D472 Monoclonal gammopathy: Secondary | ICD-10-CM | POA: Diagnosis not present

## 2022-08-29 DIAGNOSIS — M797 Fibromyalgia: Secondary | ICD-10-CM | POA: Diagnosis not present

## 2022-08-29 DIAGNOSIS — G9332 Myalgic encephalomyelitis/chronic fatigue syndrome: Secondary | ICD-10-CM | POA: Diagnosis not present

## 2022-08-29 DIAGNOSIS — D839 Common variable immunodeficiency, unspecified: Secondary | ICD-10-CM | POA: Diagnosis not present

## 2022-08-29 DIAGNOSIS — K582 Mixed irritable bowel syndrome: Secondary | ICD-10-CM | POA: Diagnosis not present

## 2022-08-29 DIAGNOSIS — G894 Chronic pain syndrome: Secondary | ICD-10-CM | POA: Diagnosis not present

## 2022-08-29 MED ORDER — LIDOCAINE-PRILOCAINE 2.5-2.5 % EX CREA
TOPICAL_CREAM | CUTANEOUS | 0 refills | Status: DC
  Filled 2022-08-29: qty 30, 1d supply, fill #0

## 2022-09-11 ENCOUNTER — Encounter: Payer: Self-pay | Admitting: Allergy

## 2022-09-12 ENCOUNTER — Other Ambulatory Visit: Payer: Self-pay

## 2022-09-12 MED ORDER — FEXOFENADINE HCL 180 MG PO TABS: 180.0000 mg | ORAL_TABLET | Freq: Every day | ORAL | 1 refills | Status: DC

## 2022-09-12 MED ORDER — LEVOCETIRIZINE DIHYDROCHLORIDE 5 MG PO TABS: ORAL_TABLET | ORAL | 1 refills | Status: DC

## 2022-09-12 MED ORDER — MONTELUKAST SODIUM 10 MG PO TABS: 10.0000 mg | ORAL_TABLET | Freq: Every day | ORAL | 1 refills | Status: DC

## 2022-09-18 DIAGNOSIS — F32A Depression, unspecified: Secondary | ICD-10-CM | POA: Insufficient documentation

## 2022-09-18 DIAGNOSIS — F4310 Post-traumatic stress disorder, unspecified: Secondary | ICD-10-CM | POA: Insufficient documentation

## 2022-09-18 DIAGNOSIS — F419 Anxiety disorder, unspecified: Secondary | ICD-10-CM | POA: Insufficient documentation

## 2022-09-19 ENCOUNTER — Other Ambulatory Visit (HOSPITAL_COMMUNITY): Payer: Self-pay

## 2022-09-19 ENCOUNTER — Telehealth: Payer: Self-pay

## 2022-09-19 NOTE — Telephone Encounter (Signed)
Patient Advocate Encounter   Received notification from [facility] [place] that prior authorization is required for Levocetirizine Dihydrochloride 5MG  tablets   Submitted: n/a Key [id number]  PA not submitted at this time. Awaiting response from office.

## 2022-09-19 NOTE — Telephone Encounter (Signed)
Per PA Team:  Does patient have primary insurance? BCBS on file shows term on 09-09-2022. If patient does not have primary, please have her contact her case worker at [facility] to removed the previous primary coverage OFF of her file. Thank you!    Called patient - No number listed on DPR - LMOVM to contact office regarding the above notation.  When patient call back - please ask about PA notation above.

## 2022-09-21 NOTE — Telephone Encounter (Signed)
Patient called back and I gave her the message that was left by Zachary George. Patient verbalized understanding and patient states she has Lindsey [name], she does not have commercial [employer]. Lindsey [name] coverage has been added to her chart.

## 2022-09-22 DIAGNOSIS — H9325 Central auditory processing disorder: Secondary | ICD-10-CM | POA: Insufficient documentation

## 2022-09-23 NOTE — Telephone Encounter (Signed)
Can we please retry Prior Authorization:   Message from patient:   Hi Lindsey [name],   Yes, I contacted Medicaid yesterday was the reason for the email above.  If you try again for the prior auth and still get the same message, please let me know what it is, and I can reach out to them.   Drop the Levocetirizine 5 mg; it is one that needs a prior auth.  I can go without taking the Levocetirizine, but I still need the Fexofenadine for the morning.   Please feel free to reach out to me at [phone] if we need to discuss.   Thank you again for all of your help with this.  I appreciate you.   Kind regards,   Lindsey [name]

## 2022-09-26 ENCOUNTER — Other Ambulatory Visit (HOSPITAL_BASED_OUTPATIENT_CLINIC_OR_DEPARTMENT_OTHER): Payer: Self-pay

## 2022-09-26 ENCOUNTER — Other Ambulatory Visit (HOSPITAL_COMMUNITY): Payer: Self-pay

## 2022-09-26 MED ORDER — METHOCARBAMOL 750 MG PO TABS
750.0000 mg | ORAL_TABLET | Freq: Every day | ORAL | 1 refills | Status: DC | PRN
  Filled 2022-10-20: qty 90, 90d supply, fill #0
  Filled 2022-10-21: qty 180, 180d supply, fill #0
  Filled 2022-10-21: qty 30, 30d supply, fill #0

## 2022-09-27 ENCOUNTER — Other Ambulatory Visit (HOSPITAL_COMMUNITY): Payer: Self-pay

## 2022-09-27 ENCOUNTER — Other Ambulatory Visit: Payer: Self-pay | Admitting: *Deleted

## 2022-09-27 ENCOUNTER — Other Ambulatory Visit (HOSPITAL_BASED_OUTPATIENT_CLINIC_OR_DEPARTMENT_OTHER): Payer: Self-pay

## 2022-09-27 ENCOUNTER — Telehealth: Payer: Self-pay

## 2022-09-27 MED ORDER — FEXOFENADINE HCL 180 MG PO TABS
180.0000 mg | ORAL_TABLET | Freq: Every day | ORAL | 1 refills | Status: DC
  Filled 2022-09-27: qty 30, 30d supply, fill #0
  Filled 2022-10-20: qty 34, 34d supply, fill #1
  Filled 2022-10-21: qty 30, 30d supply, fill #0
  Filled 2022-11-17: qty 30, 30d supply, fill #1
  Filled 2023-01-30: qty 30, 30d supply, fill #2
  Filled 2023-08-16: qty 30, 30d supply, fill #3

## 2022-09-27 NOTE — Addendum Note (Signed)
Addended by: Berna Bue on: 09/27/2022 02:12 PM   Modules accepted: Orders

## 2022-09-27 NOTE — Telephone Encounter (Signed)
Pharmacy notified of approval on allegra generic

## 2022-09-27 NOTE — Telephone Encounter (Signed)
MyChart message sent to patient.

## 2022-09-27 NOTE — Telephone Encounter (Signed)
Yes mam, did we try a prior authorization for the Fexofenadine?

## 2022-09-27 NOTE — Telephone Encounter (Signed)
Patient Advocate Encounter  Prior authorization for Fexofenadine HCl 180MG  tablets submitted and APPROVED through [employer] [place] Medicaid.  Test billing returns $4.00 copay for 30 day supply.  Key [id number] Effective: 09-27-2022 - 09-27-2023

## 2022-09-27 NOTE — Telephone Encounter (Signed)
Thank You. Sorry I should have specified that in the first message I sent you, the Fexofenadine was hidden in the message.

## 2022-09-28 ENCOUNTER — Other Ambulatory Visit (HOSPITAL_BASED_OUTPATIENT_CLINIC_OR_DEPARTMENT_OTHER): Payer: Self-pay

## 2022-09-28 MED ORDER — CLONAZEPAM 0.5 MG PO TABS
0.5000 mg | ORAL_TABLET | Freq: Every day | ORAL | 0 refills | Status: DC
  Filled 2022-09-28 (×2): qty 7, 7d supply, fill #0

## 2022-09-29 ENCOUNTER — Telehealth: Payer: Self-pay

## 2022-09-29 ENCOUNTER — Other Ambulatory Visit (HOSPITAL_BASED_OUTPATIENT_CLINIC_OR_DEPARTMENT_OTHER): Payer: Self-pay

## 2022-09-29 NOTE — Telephone Encounter (Signed)
..   Medicaid Managed Care   Unsuccessful Outreach Note  09/29/2022 Name: Lindsey [name] MRN: [id number] DOB: October 10, 1965  Referred by: [facility] [place] Reason for referral : Appointment (I called the patient today to get her scheduled with the MM Team. I left a message on her voicemail.)   An unsuccessful telephone outreach was attempted today. The patient was referred to the case management team for assistance with care management and care coordination.   Follow Up Plan: A HIPAA compliant phone message was left for the patient providing contact information and requesting a return call.  The care management team will reach out to the patient again over the next 7 days.   Weston Settle Care Guide  [facility] Managed  Care Guide [facility]  [phone]

## 2022-10-04 ENCOUNTER — Encounter: Payer: Self-pay | Admitting: *Deleted

## 2022-10-04 ENCOUNTER — Other Ambulatory Visit: Payer: BC Managed Care – PPO | Admitting: *Deleted

## 2022-10-04 NOTE — Patient Outreach (Addendum)
Care Coordination  10/04/2022  Lindsey [name] 1965-09-09 [id number]  Successful outreach with [name] for initial telephone assessment. RNCM unable to complete initial assessment today. Patient is interested in establishing PCP with a [facility] provider. RNCM provided patient with MD referral line [phone]. Patient will call to schedule a new patient appointment. RNCM scheduled a follow up telephone appointment on 10/11/22 to complete an initial assessment.  Estanislado Emms RN, BSN [place]  Managed [facility] RN Care Coordinator [phone]

## 2022-10-11 ENCOUNTER — Other Ambulatory Visit: Payer: Medicaid Other | Admitting: *Deleted

## 2022-10-11 NOTE — Patient Instructions (Signed)
Visit Information  Ms. Koser was given information about Medicaid Managed Care team care coordination services as a part of their Healthy [facility] Medicaid benefit. [name] verbally consented to engagement with the [facility] Managed Care team.   If you are experiencing a medical emergency, please call 911 or report to your local emergency department or urgent care.   If you have a non-emergency medical problem during routine business hours, please contact your provider's office and ask to speak with a nurse.   For questions related to your Healthy [facility] health plan, please call: [phone] or visit the homepage here: [email or web address]  If you would like to schedule transportation through your Healthy [facility] plan, please call the following number at least 2 days in advance of your appointment: [phone]  For information about your ride after you set it up, call Ride Assist at [phone]. Use this number to activate a Will Call pickup, or if your transportation is late for a scheduled pickup. Use this number, too, if you need to make a change or cancel a previously scheduled reservation.  If you need transportation services right away, call [phone]. The after-hours call center is staffed 24 hours to handle ride assistance and urgent reservation requests (including discharges) 365 days a year. Urgent trips include sick visits, hospital discharge requests and life-sustaining treatment.  Call the [facility] Line at [phone], at any time, 24 hours a day, 7 days a week. If you are in danger or need immediate medical attention call 911.  If you would like help to quit smoking, call 1-800-QUIT-NOW (802-110-4015) OR Espaol: 1-855-Djelo-Ya (4-742-595-6387) o para ms informacin haga clic aqu or Text READY to 564-332 to register via text  [name],   Please see education materials related to Pain provided by  MyChart link.  Patient verbalizes understanding of instructions and care plan provided today and agrees to view in MyChart. Active MyChart status and patient understanding of how to access instructions and care plan via MyChart confirmed with patient.     Telephone follow up appointment with Managed Medicaid care management team member scheduled for:11/16/22  Estanislado Emms RN, BSN [place]  Managed [facility] RN Care Coordinator [phone]   Following is a copy of your plan of care:  Care Plan : RN Care Manager Plan of Care  Updates made by Heidi Dach, RN since 10/11/2022 12:00 AM     Problem: Health Management needs related to Chronic Pain      Long-Range Goal: Development of Plan of Care to address Health Management needs related to Chronic Pain   Start Date: 10/11/2022  Expected End Date: 01/09/2023  Note:   Current Barriers:  Chronic Disease Management support and education needs related to Chronic Pain  RNCM Clinical Goal(s):  Patient will verbalize understanding of plan for management of Chronic Pain as evidenced by patient reports attend all scheduled medical appointments: 10/28/22 for Amoxicillin Challenge, 11/04/22 with New PCP, 11/04/22 with [facility] as evidenced by provider documentation        work with pharmacist to address Medication concerns related to chronic pain as evidenced by review of EMR and patient or pharmacist report    work with Child psychotherapist to address Financial constraints related to limited income related to the management of chronic pain as evidenced by review of EMR and patient or Child psychotherapist report     through collaboration with Medical illustrator, provider, and care team.   Interventions: Inter-disciplinary care team collaboration (see  longitudinal plan of care) Evaluation of current treatment plan related to  self management and patient's adherence to plan as established by provider Advised patient to contact Healthy [facility]  [phone] for member benefits    Pain:  (Status: New goal.) Long Term Goal  Pain assessment performed Medications reviewed Reviewed provider established plan for pain management; Discussed importance of adherence to all scheduled medical appointments; Advised patient to report to care team affect of pain on daily activities; Discussed use of relaxation techniques and/or diversional activities to assist with pain reduction (distraction, imagery, relaxation, massage, acupressure, TENS, heat, and cold application; Reviewed with patient prescribed pharmacological and nonpharmacological pain relief strategies; Advised patient to discuss PCS with provider; Assessed social determinant of health barriers;  BSW referral for financial resources LCSW referral for managing stress related to chronic health conditions Pharmacy referral for medication review Advised patient to write down any concerns or questions to discuss with PCP  Patient Goals/Self-Care Activities: Attend all scheduled provider appointments Call provider office for new concerns or questions  Work with the social worker to address care coordination needs and will continue to work with the clinical team to address health care and disease management related needs call the [place] National Suicide Prevention Lifeline: (442)370-0907 or TTY: (712)865-4861 TTY 463-057-7617) to talk to a trained counselor call 1-800-273-TALK (toll free, 24 hour hotline) go to [facility] [address] [place] [phone]) if experiencing a Mental Health or Behavioral Health Crisis

## 2022-10-11 NOTE — Patient Outreach (Signed)
Medicaid Managed Care   Nurse Care Manager Note  10/11/2022 Name:  Lindsey [name] MRN:  [id number] DOB:  [birth year]  Lindsey [name] is an 57 y.o. year old female who is a primary patient of [facility] [place].  The [facility] Managed Care Coordination team was consulted for assistance with:    Chronic Pain  Ms. Bensch was given information about Medicaid Managed Care Coordination team services today. [name] Patient agreed to services and verbal consent obtained.  Engaged with patient by telephone for initial visit in response to provider referral for case management and/or care coordination services.   Assessments/Interventions:  Review of past medical history, allergies, medications, health status, including review of consultants reports, laboratory and other test data, was performed as part of comprehensive evaluation and provision of chronic care management services.  SDOH (Social Determinants of Health) assessments and interventions performed: SDOH Interventions    Flowsheet Row Patient Outreach Telephone from 10/04/2022 in [place] POPULATION HEALTH DEPARTMENT  SDOH Interventions   Food Insecurity Interventions Intervention Not Indicated  Housing Interventions Intervention Not Indicated  Transportation Interventions Intervention Not Indicated  Utilities Interventions Intervention Not Indicated       Care Plan  Allergies  Allergen Reactions   Poison Oak Extract [Poison Oak Extract] Anaphylaxis   Xiidra [Lifitegrast] Swelling    Eye swelling and blurred vision.    Lidocaine     resistance   Penicillins Hives   Sulfa Antibiotics Nausea And Vomiting   Sulfamethoxazole-Trimethoprim    Zofran [Ondansetron Hcl] Nausea And Vomiting    Medications Reviewed Today     Reviewed by Heidi Dach, RN (Registered Nurse) on 10/04/22 at 0935  Med List Status: <None>   Medication Order Taking? Sig Documenting Provider Last Dose Status Informant   Acetaminophen (TYLENOL 8 HOUR ARTHRITIS PAIN PO) [id number]  Take 1,300 mg by mouth in the morning and at bedtime. [provider]  Active   albuterol (VENTOLIN HFA) 108 (90 Base) MCG/ACT inhaler [id number] Yes Inhale 2 puffs into the lungs every 6 (six) hours as needed for wheezing or shortness of breath. [provider] Taking Active   Albuterol Sulfate (PROAIR RESPICLICK) 108 (90 Base) MCG/ACT AEPB [id number]  Inhale into the lungs as needed. [provider]  Active   amitriptyline (ELAVIL) 25 MG tablet [id number] Yes Take 25 mg by mouth at bedtime. [provider] Taking Active   Ascorbic Acid (VITAMIN [name] PO) [id number] Yes Take 3,000 mg by mouth 2 (two) times daily. [provider] Taking Active   Cholecalciferol (VITAMIN D3) 1.25 MG (50000 UT) TABS [id number]  Take 1 tablet by mouth once a week. [provider]  Active   CLINPRO 5000 1.1 % PSTE [id number] Yes Place onto teeth. [provider] Taking Active   clonazePAM (KLONOPIN) 0.5 MG tablet [id number]  Take 1 tablet (0.5 mg total) by mouth at bedtime. Dohmeier, Porfirio Mylar, MD  Active   clonazePAM (KLONOPIN) 0.5 MG tablet [id number] Yes Take 1 tablet (0.5 mg total) by mouth at bedtime.  Taking Active   cycloSPORINE (RESTASIS) 0.05 % ophthalmic emulsion [id number] Yes Place 1 drop into both eyes 2 (two) times daily. [provider] Taking Active   EPINEPHrine 0.3 mg/0.3 mL IJ SOAJ injection [id number] Yes Inject 0.3 mg into the muscle as needed for anaphylaxis. Marcelyn Bruins, MD Taking Active   estradiol (VIVELLE-DOT) 0.05 MG/24HR patch [id number]  SMARTSIG:1 Patch(s) T-DERMAL Every 3 Days [provider]  Active  estradiol (VIVELLE-DOT) 0.075 MG/24HR [id number]  Place onto the skin every 3 (three) days. [provider]  Active            Med Note Lindsey [name], Lindsey [name]   Thu Jul 01, 2021  2:42 PM) Patch currently on   estradiol (VIVELLE-DOT) 0.1 MG/24HR patch  [id number] Yes Place 1 patch onto the skin 2 (two) times a week. [provider] Taking Active   fexofenadine (ALLEGRA) 180 MG tablet [id number] Yes Take 1 tablet (180 mg total) by mouth daily. Marcelyn Bruins, MD Taking Active   fludrocortisone (FLORINEF) 0.1 MG tablet [id number] No Take 1 tablet (0.1 mg total) by mouth 2 (two) times daily.  Patient not taking: Reported on 10/04/2022    Not Taking Active   gabapentin (NEURONTIN) 300 MG capsule [id number] Yes Take 300 mg by mouth at bedtime. [provider] Taking Active            Med Note (Breelyn Icard A   Tue Oct 04, 2022  9:32 AM) Taking 2 capsules at night  hyoscyamine (LEVBID) 0.375 MG 12 hr tablet [id number] Yes Take 1 tablet (0.375 mg total) by mouth 2 (two) times daily. Cirigliano, Vito V, DO Taking Active   Immune Globulin 10% (GAMUNEX-[name]) 10 GM/100ML SOLN [id number] Yes Inject 30 g into the vein every 21 ( twenty-one) days. [provider] Taking Active   levocetirizine (XYZAL) 5 MG tablet [id number] No TAKE 1 TABLET BY MOUTH EVERYDAY AT BEDTIME  Patient not taking: Reported on 10/04/2022   Marcelyn Bruins, MD Not Taking Active   lidocaine-prilocaine (EMLA) cream [id number] Yes Apply topically once for 1 dose  Taking Active   methocarbamol (ROBAXIN) 750 MG tablet [id number] Yes Take 750 mg by mouth daily. [provider] Taking Active   methocarbamol (ROBAXIN) 750 MG tablet [id number] Yes Take 1 tablet (750 mg total) by mouth daily as needed.  Taking Active            Med Note (Xitlali Kastens A   Tue Oct 04, 2022  9:29 AM) Taking 3-4 times daily  montelukast (SINGULAIR) 10 MG tablet [id number] Yes Take 1 tablet (10 mg total) by mouth at bedtime. Marcelyn Bruins, MD Taking Active   OVER THE COUNTER MEDICATION [id number]  306 mg every morning. Estrogen control [provider]  Active   OVER THE COUNTER MEDICATION [id number]  2 capsules every morning. MTHFR Endure (RIH) [provider]  Active   Peppermint Oil (IBGARD) 90 MG CPCR [id number]  Take 2 capsules by mouth in the morning and at bedtime. Cirigliano, Vito V, DO  Active   Peppermint Oil (IBGARD) 90 MG CPCR [id number]  Take 1 capsule by mouth 2 (two) times daily.   Active   Pregnenolone Micronized (PREGNENOLONE PO) [id number]  Take 75 mg by mouth 3 (three) times a week. [provider]  Active   progesterone (PROMETRIUM) 100 MG capsule [id number] Yes Take 100 mg by mouth daily. Taking 1-2 at night [provider] Taking Active   Testosterone 75 MG PLLT [id number] Yes 87.5 mg every 3 (three) months. Insert 1 pellet every 3 months [provider] Taking Active            Med Note (YOUNG, Lindsey [name]   Thu Jul 01, 2021  2:46 PM) Done last week   traZODone (DESYREL) 100 MG tablet [id number] Yes trazodone 100 mg tablet  TAKE 1-2 TABLET BY MOUTH AT BEDTIME [provider] Taking Active  Patient Active Problem List   Diagnosis Date Noted   Non-restorative sleep 01/14/2020   Menopausal and female climacteric states 01/14/2020   Insomnia due to medical condition 01/14/2020   Hypertrophy of vulva 07/20/2017   Osteoarthritis 04/01/2017   Chronic pain syndrome 08/10/2016   Fibromyalgia 08/10/2016   Lumbar radiculopathy 02/16/2016   Chronic cough 12/02/2015   Irritable larynx syndrome 12/02/2015   Genetic testing 09/21/2015   Family history of breast cancer in female 08/26/2015   Fibrocystic breast changes of both breasts 07/19/2015   Carpal tunnel syndrome 07/07/2015   Primary arthrosis of first carpometacarpal joints, bilateral 06/01/2015   Ehlers-Danlos syndrome 05/11/2015   Sacroiliac joint dysfunction of right side 11/26/2014   Hypermobility syndrome 11/26/2014   Essential alopecia of women 04/14/2011   Inflammatory dermatosis 04/14/2011    Conditions to be addressed/monitored per PCP order:   Chronic Pain  Care Plan : RN Care Manager Plan of Care   Updates made by Heidi Dach, RN since 10/11/2022 12:00 AM     Problem: Health Management needs related to Chronic Pain      Long-Range Goal: Development of Plan of Care to address Health Management needs related to Chronic Pain   Start Date: 10/11/2022  Expected End Date: 01/09/2023  Note:   Current Barriers:  Chronic Disease Management support and education needs related to Chronic Pain  RNCM Clinical Goal(s):  Patient will verbalize understanding of plan for management of Chronic Pain as evidenced by patient reports attend all scheduled medical appointments: 10/28/22 for Amoxicillin Challenge, 11/04/22 with New PCP, 11/04/22 with [facility] as evidenced by provider documentation        work with pharmacist to address Medication concerns related to chronic pain as evidenced by review of EMR and patient or pharmacist report    work with Child psychotherapist to address Financial constraints related to limited income related to the management of chronic pain as evidenced by review of EMR and patient or Child psychotherapist report     through collaboration with Medical illustrator, provider, and care team.   Interventions: Inter-disciplinary care team collaboration (see longitudinal plan of care) Evaluation of current treatment plan related to  self management and patient's adherence to plan as established by provider Advised patient to contact [employer] [phone] for member benefits    Pain:  (Status: New goal.) Long Term Goal  Pain assessment performed Medications reviewed Reviewed provider established plan for pain management; Discussed importance of adherence to all scheduled medical appointments; Advised patient to report to care team affect of pain on daily activities; Discussed use of relaxation techniques and/or diversional activities to assist with pain reduction (distraction, imagery, relaxation, massage, acupressure, TENS, heat, and cold application; Reviewed with patient  prescribed pharmacological and nonpharmacological pain relief strategies; Advised patient to discuss PCS with provider; Assessed social determinant of health barriers;  BSW referral for financial resources LCSW referral for managing stress related to chronic health conditions Pharmacy referral for medication review Advised patient to write down any concerns or questions to discuss with PCP  Patient Goals/Self-Care Activities: Attend all scheduled provider appointments Call provider office for new concerns or questions  Work with the social worker to address care coordination needs and will continue to work with the clinical team to address health care and disease management related needs call the [place] National Suicide Prevention Lifeline: 847 115 6544 or TTY: 417 011 4376 TTY 443-270-5271) to talk to a trained counselor call 1-800-273-TALK (toll free, 24 hour hotline) go to [employer]  [facility] [address], [place] [phone]) if experiencing a Mental Health or Behavioral Health Crisis        Follow Up:  Patient agrees to Care Plan and Follow-up.  Plan: The Managed Medicaid care management team will reach out to the patient again over the next 30 days.  Date/time of next scheduled RN care management/care coordination outreach:  11/16/22 @ 9 am  Estanislado Emms RN, BSN [employer]  Managed Medicaid RN Care Coordinator [phone]

## 2022-10-17 ENCOUNTER — Telehealth: Payer: Self-pay

## 2022-10-17 NOTE — Progress Notes (Unsigned)
   Care Guide Note  10/17/2022 Name: Lindsey [name] MRN: [id number] DOB: 02-Nov-1965  Referred by: [facility] [place] Reason for referral : Care Coordination (Outreach to schedule with Pharm d )   Lindsey [name] is a 57 y.o. year old female who is a primary care patient of [facility] [place]. Lindsey [name] was referred to the pharmacist for assistance related to  Med review  .    An unsuccessful telephone outreach was attempted today to contact the patient who was referred to the pharmacy team for assistance with medication management. Additional attempts will be made to contact the patient.   Penne Lash, RMA Care Guide [facility]  [place], [place] [address] Direct Dial: [phone] [email or web address]

## 2022-10-19 ENCOUNTER — Other Ambulatory Visit: Payer: Medicaid Other

## 2022-10-19 ENCOUNTER — Other Ambulatory Visit: Payer: Medicaid Other | Admitting: Licensed Clinical Social Worker

## 2022-10-19 NOTE — Patient Outreach (Addendum)
Medicaid Managed Care Social Work Note  10/19/2022 Name:  Lindsey [name] MRN:  [id number] DOB:  1965-08-05  Lindsey [name] is an 57 y.o. year old female who is a primary patient of [facility] [place].  The Medicaid Managed Care Coordination team was consulted for assistance with:  Mental Health Counseling and Resources  Lindsey [name] was given information about Medicaid Managed Care Coordination team services today. Lindsey [name] Patient agreed to services and verbal consent obtained.  Engaged with patient  for by telephone forinitial visit in response to referral for case management and/or care coordination services.   Assessments/Interventions:  Review of past medical history, allergies, medications, health status, including review of consultants reports, laboratory and other test data, was performed as part of comprehensive evaluation and provision of chronic care management services.  SDOH: (Social Determinant of Health) assessments and interventions performed: SDOH Interventions    Flowsheet Row Patient Outreach Telephone from 10/19/2022 in [place] POPULATION HEALTH DEPARTMENT Patient Outreach Telephone from 10/04/2022 in [place] POPULATION HEALTH DEPARTMENT  SDOH Interventions    Food Insecurity Interventions -- Intervention Not Indicated  Housing Interventions -- Intervention Not Indicated  Transportation Interventions -- Intervention Not Indicated  Utilities Interventions -- Intervention Not Indicated  Stress Interventions Offered [employer] Resources, Provide Counseling --       Advanced Directives Status:  Not addressed in this encounter.  Care Plan                 Allergies  Allergen Reactions   Poison Oak Extract [Poison Oak Extract] Anaphylaxis   Xiidra [Lifitegrast] Swelling    Eye swelling and blurred vision.    Lidocaine     resistance   Penicillins Hives   Sulfa Antibiotics Nausea And Vomiting    Sulfamethoxazole-Trimethoprim    Zofran [Ondansetron Hcl] Nausea And Vomiting    Medications Reviewed Today     Reviewed by Heidi Dach, RN (Registered Nurse) on 10/04/22 at 0935  Med List Status: <None>   Medication Order Taking? Sig Documenting Provider Last Dose Status Informant  Acetaminophen (TYLENOL 8 HOUR ARTHRITIS PAIN PO) [id number]  Take 1,300 mg by mouth in the morning and at bedtime. [provider]  Active   albuterol (VENTOLIN HFA) 108 (90 Base) MCG/ACT inhaler [id number] Yes Inhale 2 puffs into the lungs every 6 (six) hours as needed for wheezing or shortness of breath. [provider] Taking Active   Albuterol Sulfate (PROAIR RESPICLICK) 108 (90 Base) MCG/ACT AEPB [id number]  Inhale into the lungs as needed. [provider]  Active   amitriptyline (ELAVIL) 25 MG tablet [id number] Yes Take 25 mg by mouth at bedtime. [provider] Taking Active   Ascorbic Acid (VITAMIN C PO) [id number] Yes Take 3,000 mg by mouth 2 (two) times daily. [provider] Taking Active   Cholecalciferol (VITAMIN D3) 1.25 MG (50000 UT) TABS [id number]  Take 1 tablet by mouth once a week. [provider]  Active   CLINPRO 5000 1.1 % PSTE [id number] Yes Place onto teeth. [provider] Taking Active   clonazePAM (KLONOPIN) 0.5 MG tablet [id number]  Take 1 tablet (0.5 mg total) by mouth at bedtime. Dohmeier, Porfirio Mylar, MD  Active   clonazePAM (KLONOPIN) 0.5 MG tablet [id number] Yes Take 1 tablet (0.5 mg total) by mouth at bedtime.  Taking Active   cycloSPORINE (RESTASIS) 0.05 % ophthalmic emulsion [id number] Yes Place 1 drop into both eyes 2 (two) times daily. [provider] Taking Active  EPINEPHrine 0.3 mg/0.3 mL IJ SOAJ injection [id number] Yes Inject 0.3 mg into the muscle as needed for anaphylaxis. Marcelyn Bruins, MD Taking Active   estradiol (VIVELLE-DOT) 0.05 MG/24HR patch [id number]  SMARTSIG:1 Patch(s) T-DERMAL Every 3 Days  [provider]  Active   estradiol (VIVELLE-DOT) 0.075 MG/24HR [id number]  Place onto the skin every 3 (three) days. [provider]  Active            Med Note [name], [name]   Thu Jul 01, 2021  2:42 PM) Patch currently on   estradiol (VIVELLE-DOT) 0.1 MG/24HR patch [id number] Yes Place 1 patch onto the skin 2 (two) times a week. [provider] Taking Active   fexofenadine (ALLEGRA) 180 MG tablet [id number] Yes Take 1 tablet (180 mg total) by mouth daily. Marcelyn Bruins, MD Taking Active   fludrocortisone (FLORINEF) 0.1 MG tablet [id number] No Take 1 tablet (0.1 mg total) by mouth 2 (two) times daily.  Patient not taking: Reported on 10/04/2022    Not Taking Active   gabapentin (NEURONTIN) 300 MG capsule [id number] Yes Take 300 mg by mouth at bedtime. [provider] Taking Active            Med Note (ROBB, MELANIE A   Tue Oct 04, 2022  9:32 AM) Taking 2 capsules at night  hyoscyamine (LEVBID) 0.375 MG 12 hr tablet [id number] Yes Take 1 tablet (0.375 mg total) by mouth 2 (two) times daily. Cirigliano, Vito V, DO Taking Active   Immune Globulin 10% (GAMUNEX-C) 10 GM/100ML SOLN [id number] Yes Inject 30 g into the vein every 21 ( twenty-one) days. [provider] Taking Active   levocetirizine (XYZAL) 5 MG tablet [id number] No TAKE 1 TABLET BY MOUTH EVERYDAY AT BEDTIME  Patient not taking: Reported on 10/04/2022   Marcelyn Bruins, MD Not Taking Active   lidocaine-prilocaine (EMLA) cream [id number] Yes Apply topically once for 1 dose  Taking Active   methocarbamol (ROBAXIN) 750 MG tablet [id number] Yes Take 750 mg by mouth daily. [provider] Taking Active   methocarbamol (ROBAXIN) 750 MG tablet [id number] Yes Take 1 tablet (750 mg total) by mouth daily as needed.  Taking Active            Med Note (ROBB, MELANIE A   Tue Oct 04, 2022  9:29 AM) Taking 3-4 times daily  montelukast (SINGULAIR) 10 MG tablet [id number] Yes Take 1  tablet (10 mg total) by mouth at bedtime. Marcelyn Bruins, MD Taking Active   OVER THE COUNTER MEDICATION [id number]  306 mg every morning. Estrogen control [provider]  Active   OVER THE COUNTER MEDICATION [id number]  2 capsules every morning. MTHFR Endure (RIH) [provider]  Active   Peppermint Oil (IBGARD) 90 MG CPCR [id number]  Take 2 capsules by mouth in the morning and at bedtime. Cirigliano, Vito V, DO  Active   Peppermint Oil (IBGARD) 90 MG CPCR [id number]  Take 1 capsule by mouth 2 (two) times daily.   Active   Pregnenolone Micronized (PREGNENOLONE PO) [id number]  Take 75 mg by mouth 3 (three) times a week. [provider]  Active   progesterone (PROMETRIUM) 100 MG capsule [id number] Yes Take 100 mg by mouth daily. Taking 1-2 at night [provider] Taking Active   Testosterone 75 MG PLLT [id number] Yes 87.5 mg every 3 (three) months. Insert 1 pellet every 3 months [provider] Taking Active  Med Note [name], Billey Gosling Jul 01, 2021  2:46 PM) Done last week   traZODone (DESYREL) 100 MG tablet [id number] Yes trazodone 100 mg tablet  TAKE 1-2 TABLET BY MOUTH AT BEDTIME [provider] Taking Active             Patient Active Problem List   Diagnosis Date Noted   Non-restorative sleep 01/14/2020   Menopausal and female climacteric states 01/14/2020   Insomnia due to medical condition 01/14/2020   Hypertrophy of vulva 07/20/2017   Osteoarthritis 04/01/2017   Chronic pain syndrome 08/10/2016   Fibromyalgia 08/10/2016   Lumbar radiculopathy 02/16/2016   Chronic cough 12/02/2015   Irritable larynx syndrome 12/02/2015   Genetic testing 09/21/2015   Family history of breast cancer in female 08/26/2015   Fibrocystic breast changes of both breasts 07/19/2015   Carpal tunnel syndrome 07/07/2015   Primary arthrosis of first carpometacarpal joints, bilateral 06/01/2015   Ehlers-Danlos syndrome 05/11/2015    Sacroiliac joint dysfunction of right side 11/26/2014   Hypermobility syndrome 11/26/2014   Essential alopecia of women 04/14/2011   Inflammatory dermatosis 04/14/2011    Conditions to be addressed/monitored per PCP order:   Stress management   Care Plan : LCSW Plan of Care  Updates made by Gustavus Bryant, LCSW since 10/19/2022 12:00 AM     Problem: Stress management      Goal: Stress Symptoms Identified   Start Date: 10/19/2022  Note:   Current Barriers:  Chronic Disease Management support and education needs related to Chronic Pain Mental Health Concerns regarding pain and lack of socialization and support. Patient does not wish to gain mental health support (had therapy in the past and prefers holistic approaches.)  Need for caregiver support   [facility] LCSW Clinical Goal(s):  Patient will verbalize understanding of plan for management of Stress and Chronic Pain as evidenced by patient reports work with Child psychotherapist to address Financial constraints related to limited income related to the management of chronic pain as evidenced by review of EMR and patient or Child psychotherapist report through collaboration with Medical illustrator, provider, and care team.    Interventions: Inter-disciplinary care team collaboration (see longitudinal plan of care) Evaluation of current treatment plan related to  self management and patient's adherence to plan as established by provider Advised patient to contact [employer] [phone] for member benefits Discussed importance of adherence to all scheduled medical appointments; Advised patient to report to care team affect of pain on daily activities; Discussed use of relaxation techniques and/or diversional activities to assist with pain reduction (distraction, imagery, relaxation, massage, acupressure, TENS, heat, and cold application; Advised patient to discuss PCS with provider; patient is interested in caregiver support resources   Encouraged pt to continue daily stretching and spend time in the sunshine and outdoors  Patient was encouraged to continue her "pet therapy" Assessed social determinant of health barriers;  BSW involved for financial resources Pharmacy initial appointment scheduled for medication review Advised patient to write down any concerns or questions to discuss with PCP    Patient Goals/Self-Care Activities: Attend all scheduled provider appointments Call provider office for new concerns or questions  Work with the [facility] RNCM and social worker to address care coordination needs and will continue to work with the clinical team to address health care and disease management related needs call the [place] National Suicide Prevention Lifeline: (340) 594-8982 or TTY: 613-729-1276 TTY (508)301-6940) to talk to a trained counselor call 1-800-273-TALK (toll  free, 24 hour hotline) go to [facility] [address] [place] [phone]) if experiencing a Mental Health or Behavioral Health Crisis              10 LITTLE Things To Do When You're Feeling Too Down To Do Anything  Take a shower. Even if you plan to stay in all day long and not see a soul, take a shower. It takes the most effort to hop in to the shower but once you do, you'll feel immediate results. It will wake you up and you'll be feeling much fresher (and cleaner too).  Brush and floss your teeth. Give your teeth a good brushing with a floss finish. It's a small task but it feels so good and you can check 'taking care of your health' off the list of things to do.  Do something small on your list. Most of [place] have some small thing we would like to get done (load of laundry, sew a button, email a friend). Doing one of these things will make you feel like you've accomplished something.  Drink water. Drinking water is easy right? It's also really beneficial for your health so keep a glass beside you all day  and take sips often. It gives you energy and prevents you from boredom eating.  Do some floor exercises. The last thing you want to do is exercise but it might be just the thing you need the most. Keep it simple and do exercises that involve sitting or laying on the floor. Even the smallest of exercises release chemicals in the brain that make you feel good. Yoga stretches or core exercises are going to make you feel good with minimal effort.  Make your bed. Making your bed takes a few minutes but it's productive and you'll feel relieved when it's done. An unmade bed is a huge visual reminder that you're having an unproductive day. Do it and consider it your housework for the day.  Put on some nice clothes. Take the sweatpants off even if you don't plan to go anywhere. Put on clothes that make you feel good. Take a look in the mirror so your brain recognizes the sweatpants have been replaced with clothes that make you look great. It's an instant confidence booster.  Wash the dishes. A pile of dirty dishes in the sink is a reflection of your mood. It's possible that if you wash up the dishes, your mood will follow suit. It's worth a try.  Cook a real meal. If you have the luxury to have a "do nothing" day, you have time to make a real meal for yourself. Make a meal that you love to eat. The process is good to get you out of the funk and the food will ensure you have more energy for tomorrow.  Write out your thoughts by hand. When you hand write, you stimulate your brain to focus on the moment that you're in so make yourself comfortable and write whatever comes into your mind. Put those thoughts out on paper so they stop spinning around in your head. Those thoughts might be the very thing holding you down.     24- Hour Availability:    [facility]  [address] [place], [place] [phone] Crisis [phone]   [facility]  [phone]  [place] [facility]  [phone]    [facility]  [phone] (after hours)  Therapeutic Alternative/Mobile Crisis   [phone]   [place] National Suicide Hotline  573 154 2910 [address]) [place] 063   Call [phone] for mental health emergencies   [facility]  [phone]);  [place] and [employer]  [phone]); [place], [place], [place], [place], [place], [place], [place]    [facility] for [facility] Residents For 24/7 walk-up access to mental health services for [facility] children (4+), adolescents and adults, please visit the [facility] located at [address] in [place], [place].  *[facility] also provides comprehensive outpatient behavioral health services in a variety of locations around the [place].  Connect With [place] [address] [place], [place] [address] HelpLine: 947-475-8961 or 1-470-366-2802  Get Directions  Find Help 24/7 By Phone Call our 24-hour HelpLine at 613-492-2224 or (219)498-9369 for immediate assistance for mental health and substance abuse issues.  Walk-In Help [place]: [facility] (Ages 4 and Up) [place]: Emergency Dept., [facility] Additional Resources National Hopeline Network: 1-800-SUICIDE The National Suicide Prevention Lifeline: 1-800-273-TALK      Follow up:  Patient agrees to Care Plan and Follow-up. [facility] LCSW follow up appointment 11/11/22 at 1:15 pm  Plan: The Managed Medicaid care management team will reach out to the patient again over the next 30 days.  Dickie La, BSW, MSW, [employer] Managed Medicaid LCSW [facility]  [facility] [email or web address] Phone: [phone]

## 2022-10-19 NOTE — Patient Outreach (Signed)
Medicaid Managed Care Social Work Note  10/19/2022 Name:  Lindsey [name] MRN:  [id number] DOB:  Jul 19, 1965  Lindsey [name] is an 57 y.o. year old female who is a primary patient of [facility] [place].  The Medicaid Managed Care Coordination team was consulted for assistance with:  Community Resources   Ms. Muhlbauer was given information about Medicaid Managed Care Coordination team services today. [name] Patient agreed to services and verbal consent obtained.  Engaged with patient  for by telephone forinitial visit in response to referral for case management and/or care coordination services.   Assessments/Interventions:  Review of past medical history, allergies, medications, health status, including review of consultants reports, laboratory and other test data, was performed as part of comprehensive evaluation and provision of chronic care management services.  SDOH: (Social Determinant of Health) assessments and interventions performed: SDOH Interventions    Flowsheet Row Patient Outreach Telephone from 10/04/2022 in [place] POPULATION HEALTH DEPARTMENT  SDOH Interventions   Food Insecurity Interventions Intervention Not Indicated  Housing Interventions Intervention Not Indicated  Transportation Interventions Intervention Not Indicated  Utilities Interventions Intervention Not Indicated     BSW completed a telephone outreach with patient. She is currently receiving long term disability, she may start receieving social security disability. Patient states she does have a lein against her home. She bought her home as is in 2018 and has been without heat and AC. BSW will send patient resources for home project assistance, utilities and food.   Advanced Directives Status:  Not addressed in this encounter.  Care Plan                 Allergies  Allergen Reactions   Poison Oak Extract [Poison Oak Extract] Anaphylaxis   Xiidra [Lifitegrast] Swelling     Eye swelling and blurred vision.    Lidocaine     resistance   Penicillins Hives   Sulfa Antibiotics Nausea And Vomiting   Sulfamethoxazole-Trimethoprim    Zofran [Ondansetron Hcl] Nausea And Vomiting    Medications Reviewed Today     Reviewed by Heidi Dach, RN (Registered Nurse) on 10/04/22 at 0935  Med List Status: <None>   Medication Order Taking? Sig Documenting Provider Last Dose Status Informant  Acetaminophen (TYLENOL 8 HOUR ARTHRITIS PAIN PO) [id number]  Take 1,300 mg by mouth in the morning and at bedtime. [provider]  Active   albuterol (VENTOLIN HFA) 108 (90 Base) MCG/ACT inhaler [id number] Yes Inhale 2 puffs into the lungs every 6 (six) hours as needed for wheezing or shortness of breath. [provider] Taking Active   Albuterol Sulfate (PROAIR RESPICLICK) 108 (90 Base) MCG/ACT AEPB [id number]  Inhale into the lungs as needed. [provider]  Active   amitriptyline (ELAVIL) 25 MG tablet [id number] Yes Take 25 mg by mouth at bedtime. [provider] Taking Active   Ascorbic Acid (VITAMIN C PO) [id number] Yes Take 3,000 mg by mouth 2 (two) times daily. [provider] Taking Active   Cholecalciferol (VITAMIN D3) 1.25 MG (50000 UT) TABS [id number]  Take 1 tablet by mouth once a week. [provider]  Active   CLINPRO 5000 1.1 % PSTE [id number] Yes Place onto teeth. [provider] Taking Active   clonazePAM (KLONOPIN) 0.5 MG tablet [id number]  Take 1 tablet (0.5 mg total) by mouth at bedtime. Dohmeier, Porfirio Mylar, MD  Active   clonazePAM (KLONOPIN) 0.5 MG tablet [id number] Yes Take 1 tablet (0.5 mg total) by mouth  at bedtime.  Taking Active   cycloSPORINE (RESTASIS) 0.05 % ophthalmic emulsion [id number] Yes Place 1 drop into both eyes 2 (two) times daily. [provider] Taking Active   EPINEPHrine 0.3 mg/0.3 mL IJ SOAJ injection [id number] Yes Inject 0.3 mg into the muscle as needed for anaphylaxis. Marcelyn Bruins, MD Taking Active   estradiol (VIVELLE-DOT) 0.05 MG/24HR patch [id number]  SMARTSIG:1 Patch(s) T-DERMAL Every 3 Days [provider]  Active   estradiol (VIVELLE-DOT) 0.075 MG/24HR [id number]  Place onto the skin every 3 (three) days. [provider]  Active            Med Note [name], [name]   Thu Jul 01, 2021  2:42 PM) Patch currently on   estradiol (VIVELLE-DOT) 0.1 MG/24HR patch [id number] Yes Place 1 patch onto the skin 2 (two) times a week. [provider] Taking Active   fexofenadine (ALLEGRA) 180 MG tablet [id number] Yes Take 1 tablet (180 mg total) by mouth daily. Marcelyn Bruins, MD Taking Active   fludrocortisone (FLORINEF) 0.1 MG tablet [id number] No Take 1 tablet (0.1 mg total) by mouth 2 (two) times daily.  Patient not taking: Reported on 10/04/2022    Not Taking Active   gabapentin (NEURONTIN) 300 MG capsule [id number] Yes Take 300 mg by mouth at bedtime. [provider] Taking Active            Med Note (ROBB, MELANIE A   Tue Oct 04, 2022  9:32 AM) Taking 2 capsules at night  hyoscyamine (LEVBID) 0.375 MG 12 hr tablet [id number] Yes Take 1 tablet (0.375 mg total) by mouth 2 (two) times daily. Cirigliano, Vito V, DO Taking Active   Immune Globulin 10% (GAMUNEX-C) 10 GM/100ML SOLN [id number] Yes Inject 30 g into the vein every 21 ( twenty-one) days. [provider] Taking Active   levocetirizine (XYZAL) 5 MG tablet [id number] No TAKE 1 TABLET BY MOUTH EVERYDAY AT BEDTIME  Patient not taking: Reported on 10/04/2022   Marcelyn Bruins, MD Not Taking Active   lidocaine-prilocaine (EMLA) cream [id number] Yes Apply topically once for 1 dose  Taking Active   methocarbamol (ROBAXIN) 750 MG tablet [id number] Yes Take 750 mg by mouth daily. [provider] Taking Active   methocarbamol (ROBAXIN) 750 MG tablet [id number] Yes Take 1 tablet (750 mg total) by mouth daily as needed.  Taking Active            Med Note  (ROBB, MELANIE A   Tue Oct 04, 2022  9:29 AM) Taking 3-4 times daily  montelukast (SINGULAIR) 10 MG tablet [id number] Yes Take 1 tablet (10 mg total) by mouth at bedtime. Marcelyn Bruins, MD Taking Active   OVER THE COUNTER MEDICATION [id number]  306 mg every morning. Estrogen control [provider]  Active   OVER THE COUNTER MEDICATION [id number]  2 capsules every morning. MTHFR Endure (RIH) [provider]  Active   Peppermint Oil (IBGARD) 90 MG CPCR [id number]  Take 2 capsules by mouth in the morning and at bedtime. Cirigliano, Vito V, DO  Active   Peppermint Oil (IBGARD) 90 MG CPCR [id number]  Take 1 capsule by mouth 2 (two) times daily.   Active   Pregnenolone Micronized (PREGNENOLONE PO) [id number]  Take 75 mg by mouth 3 (three) times a week. [provider]  Active   progesterone (PROMETRIUM) 100 MG capsule [id number] Yes Take 100 mg by mouth daily. Taking 1-2 at night [provider] Taking Active   Testosterone  75 MG PLLT [id number] Yes 87.5 mg every 3 (three) months. Insert 1 pellet every 3 months [provider] Taking Active            Med Note [name], Billey Gosling Jul 01, 2021  2:46 PM) Done last week   traZODone (DESYREL) 100 MG tablet [id number] Yes trazodone 100 mg tablet  TAKE 1-2 TABLET BY MOUTH AT BEDTIME [provider] Taking Active             Patient Active Problem List   Diagnosis Date Noted   Non-restorative sleep 01/14/2020   Menopausal and female climacteric states 01/14/2020   Insomnia due to medical condition 01/14/2020   Hypertrophy of vulva 07/20/2017   Osteoarthritis 04/01/2017   Chronic pain syndrome 08/10/2016   Fibromyalgia 08/10/2016   Lumbar radiculopathy 02/16/2016   Chronic cough 12/02/2015   Irritable larynx syndrome 12/02/2015   Genetic testing 09/21/2015   Family history of breast cancer in female 08/26/2015   Fibrocystic breast changes of both breasts 07/19/2015   Carpal tunnel  syndrome 07/07/2015   Primary arthrosis of first carpometacarpal joints, bilateral 06/01/2015   Ehlers-Danlos syndrome 05/11/2015   Sacroiliac joint dysfunction of right side 11/26/2014   Hypermobility syndrome 11/26/2014   Essential alopecia of women 04/14/2011   Inflammatory dermatosis 04/14/2011    Conditions to be addressed/monitored per PCP order:   community resources  There are no care plans that you recently modified to display for this patient.   Follow up:  Patient agrees to Care Plan and Follow-up.  Plan: The Managed Medicaid care management team will reach out to the patient again over the next 30 days.  Date/time of next scheduled Social Work care management/care coordination outreach:  11/10/22  Gus Puma, Kenard Gower, [facility]  Managed [facility] Social Worker [phone]

## 2022-10-19 NOTE — Progress Notes (Signed)
   Care Guide Note  10/19/2022 Name: [name] MRN: [id number] DOB: February 27, 1966  Referred by: [facility] [place] Reason for referral : Care Coordination (Outreach to schedule with Pharm d )   Lindsey [name] is a 57 y.o. year old female who is a primary care patient of [facility] [place]. Korbin Notaro was referred to the pharmacist for assistance related to DM.    Successful contact was made with the patient to discuss pharmacy services including being ready for the pharmacist to call at least 5 minutes before the scheduled appointment time, to have medication bottles and any blood sugar or blood pressure readings ready for review. The patient agreed to meet with the pharmacist via with the pharmacist via telephone visit on (date/time).  11/23/2022  Penne Lash, RMA Care Guide [facility]  [place], [place] [address] Direct Dial: [phone] [email or web address]

## 2022-10-19 NOTE — Patient Instructions (Signed)
Visit Information  Ms. Dolder was given information about Medicaid Managed Care team care coordination services as a part of their Healthy [facility] Medicaid benefit. [name] verbally consented to engagement with the [facility] Managed Care team.   If you are experiencing a medical emergency, please call 911 or report to your local emergency department or urgent care.   If you have a non-emergency medical problem during routine business hours, please contact your provider's office and ask to speak with a nurse.   For questions related to your Healthy [facility] health plan, please call: [phone] or visit the homepage here: [email or web address]  If you would like to schedule transportation through your Healthy [facility] plan, please call the following number at least 2 days in advance of your appointment: [phone]  For information about your ride after you set it up, call Ride Assist at [phone]. Use this number to activate a Will Call pickup, or if your transportation is late for a scheduled pickup. Use this number, too, if you need to make a change or cancel a previously scheduled reservation.  If you need transportation services right away, call [phone]. The after-hours call center is staffed 24 hours to handle ride assistance and urgent reservation requests (including discharges) 365 days a year. Urgent trips include sick visits, hospital discharge requests and life-sustaining treatment.  Call the [facility] Line at [phone], at any time, 24 hours a day, 7 days a week. If you are in danger or need immediate medical attention call 911.  If you would like help to quit smoking, call 1-800-QUIT-NOW (435-347-5445) OR Espaol: 1-855-Djelo-Ya (0-630-160-1093) o para ms informacin haga clic aqu or Text READY to 235-573 to register via text  [name] - following are the goals we discussed in your visit today:   Goals  Addressed   None      Social Worker will follow up in 11/10/22.   Gus Puma, Kenard Gower, MHA [facility]  Managed Medicaid Social Worker [phone]   Following is a copy of your plan of care:  There are no care plans that you recently modified to display for this patient.

## 2022-10-19 NOTE — Patient Instructions (Addendum)
Visit Information  Lindsey [name] was given information about Medicaid Managed Care team care coordination services as a part of their Healthy [facility] Medicaid benefit. [name] verbally consented to engagement with the [facility] Managed Care team.   If you are experiencing a medical emergency, please call 911 or report to your local emergency department or urgent care.   If you have a non-emergency medical problem during routine business hours, please contact your provider's office and ask to speak with a nurse.   For questions related to your Healthy [facility] health plan, please call: [phone] or visit the homepage here: [email or web address]  If you would like to schedule transportation through your Healthy [facility] plan, please call the following number at least 2 days in advance of your appointment: [phone]  For information about your ride after you set it up, call Ride Assist at [phone]. Use this number to activate a Will Call pickup, or if your transportation is late for a scheduled pickup. Use this number, too, if you need to make a change or cancel a previously scheduled reservation.  If you need transportation services right away, call [phone]. The after-hours call center is staffed 24 hours to handle ride assistance and urgent reservation requests (including discharges) 365 days a year. Urgent trips include sick visits, hospital discharge requests and life-sustaining treatment.  Call the [facility] Line at [phone], at any time, 24 hours a day, 7 days a week. If you are in danger or need immediate medical attention call 911.  If you would like help to quit smoking, call 1-800-QUIT-NOW (308-523-1217) OR Espaol: 1-855-Djelo-Ya (5-643-329-5188) o para ms informacin haga clic aqu or Text READY to 416-606 to register via text   Following is a copy of your plan of care:  Care Plan : LCSW Plan of Care   Updates made by Gustavus Bryant, LCSW since 10/19/2022 12:00 AM     Problem: Stress management      Goal: Stress Symptoms Identified   Start Date: 10/19/2022  Note:   Current Barriers:  Chronic Disease Management support and education needs related to Chronic Pain Mental Health Concerns regarding pain and lack of socialization and support. Patient does not wish to gain mental health support (had therapy in the past and prefers holistic approaches.)  Need for caregiver support   [facility] LCSW Clinical Goal(s):  Patient will verbalize understanding of plan for management of Stress and Chronic Pain as evidenced by patient reports work with Child psychotherapist to address Financial constraints related to limited income related to the management of chronic pain as evidenced by review of EMR and patient or Child psychotherapist report through collaboration with Medical illustrator, provider, and care team.     Patient Goals/Self-Care Activities: Attend all scheduled provider appointments Call provider office for new concerns or questions  Work with the [facility] RNCM and social worker to address care coordination needs and will continue to work with the clinical team to address health care and disease management related needs call the [place] National Suicide Prevention Lifeline: (251)613-0238 or TTY: 317-571-1870 TTY 931-353-0671) to talk to a trained counselor call 1-800-273-TALK (toll free, 24 hour hotline) go to [facility] [address], [place] [id number]) if experiencing a Mental Health or Behavioral Health Crisis       I will call you on 11/11/22 at 1:15 pm for our next appointment  10 LITTLE Things To Do When You're Feeling Too Down To Do Anything  Take  a shower. Even if you plan to stay in all day long and not see a soul, take a shower. It takes the most effort to hop in to the shower but once you do, you'll feel immediate results. It will wake you up and you'll be  feeling much fresher (and cleaner too).  Brush and floss your teeth. Give your teeth a good brushing with a floss finish. It's a small task but it feels so good and you can check 'taking care of your health' off the list of things to do.  Do something small on your list. Most of [place] have some small thing we would like to get done (load of laundry, sew a button, email a friend). Doing one of these things will make you feel like you've accomplished something.  Drink water. Drinking water is easy right? It's also really beneficial for your health so keep a glass beside you all day and take sips often. It gives you energy and prevents you from boredom eating.  Do some floor exercises. The last thing you want to do is exercise but it might be just the thing you need the most. Keep it simple and do exercises that involve sitting or laying on the floor. Even the smallest of exercises release chemicals in the brain that make you feel good. Yoga stretches or core exercises are going to make you feel good with minimal effort.  Make your bed. Making your bed takes a few minutes but it's productive and you'll feel relieved when it's done. An unmade bed is a huge visual reminder that you're having an unproductive day. Do it and consider it your housework for the day.  Put on some nice clothes. Take the sweatpants off even if you don't plan to go anywhere. Put on clothes that make you feel good. Take a look in the mirror so your brain recognizes the sweatpants have been replaced with clothes that make you look great. It's an instant confidence booster.  Wash the dishes. A pile of dirty dishes in the sink is a reflection of your mood. It's possible that if you wash up the dishes, your mood will follow suit. It's worth a try.  Cook a real meal. If you have the luxury to have a "do nothing" day, you have time to make a real meal for yourself. Make a meal that you love to eat. The process is good to get you out  of the funk and the food will ensure you have more energy for tomorrow.  Write out your thoughts by hand. When you hand write, you stimulate your brain to focus on the moment that you're in so make yourself comfortable and write whatever comes into your mind. Put those thoughts out on paper so they stop spinning around in your head. Those thoughts might be the very thing holding you down.    Dickie La, BSW, MSW, [employer] Managed Medicaid LCSW [facility]  [facility] [email or web address] Phone: [phone]

## 2022-10-20 ENCOUNTER — Encounter (HOSPITAL_COMMUNITY): Payer: Self-pay

## 2022-10-21 ENCOUNTER — Other Ambulatory Visit: Payer: Self-pay

## 2022-10-21 ENCOUNTER — Other Ambulatory Visit (HOSPITAL_COMMUNITY): Payer: Self-pay

## 2022-10-24 ENCOUNTER — Other Ambulatory Visit (HOSPITAL_COMMUNITY): Payer: Self-pay

## 2022-10-24 MED ORDER — METHOCARBAMOL 750 MG PO TABS
750.0000 mg | ORAL_TABLET | Freq: Two times a day (BID) | ORAL | 1 refills | Status: DC | PRN
  Filled 2022-10-24: qty 180, 90d supply, fill #0

## 2022-10-25 ENCOUNTER — Other Ambulatory Visit (HOSPITAL_COMMUNITY): Payer: Self-pay

## 2022-10-26 ENCOUNTER — Other Ambulatory Visit (HOSPITAL_COMMUNITY): Payer: Self-pay

## 2022-10-27 ENCOUNTER — Other Ambulatory Visit (HOSPITAL_COMMUNITY): Payer: Self-pay

## 2022-11-02 ENCOUNTER — Encounter: Payer: Self-pay | Admitting: Family Medicine

## 2022-11-02 NOTE — Progress Notes (Addendum)
Chief Complaint  Patient presents with   Establish Care    PCS - form already filled out and ready to be signed.    Referral    To endo- pended for review & to OT - pended for review.   Medication Management    In need of nebulizer   HPI: [name] is a 57 y.o. female, who is here today to establish care.  Former PCP: [employer] [facility]. Last preventive routine visit: 05/26/22.  She has a complex medical history and multiple ongoing health concerns. She reports a history of autoimmune disease, generalized pain (myalgias and arthralgias), asthma, primary immunodeficiency, and smoldering multiple myeloma ("cancer") among some.   She is currently under the care of an immunologist for common variable immunodeficiency and asthma, sees provider every three months, and sees hematologist at [facility] for her myeloma.  Today she is requesting referral to endodontologist, occupational health,and ENT.  IBS: She follows with gastroenterologist. : She follows with immunologist. Hx of syncopal and presyncopal episodes, dx'ed with postural orthostatic tachycardia syndrome. She follows with cardiologist regularly.  She mentions frequent urinary tract infections, the most recent occurring six months ago, associated with hospital visits due to dehydration.  Requesting a PCS form completed, she states that she needs assistance with preparing meals, "home work",and showering because she does "not feel" like doing it.  She also discusses difficulties with mobility and daily activities due to vasovagal syncope, which has led her cardiologist to advise against driving, although she admits she still drives occasionally. Reports sleep study done a few years ago and it was "inconclusive."  She has undergone surgery for carpal tunnel syndrome, resulting in significant functional limitations in her right hand, including an inability to open water bottles or pick up objects. States  that recovery from this surgery is expected to take up to two years, but she returned to work after three months with accommodations that were not fully implemented.  States that it has been suggested that she may have adrenal insufficiency and it was prescribed fludrocortisone, which she has not started.She has not had testing done yet. ontains abnormal data Comprehensive Metabolic Panel (CMP) Order: [id number] Component Ref Range & Units 4 mo ago  Sodium 135 - 145 mmol/L 134 Low   Potassium 3.5 - 5.0 mmol/L 3.9  Chloride 98 - 108 mmol/L 106  Carbon Dioxide (CO2) 21 - 30 mmol/L 19 Low   Urea Nitrogen (BUN) 7 - 20 mg/dL 15  Creatinine 0.4 - 1.0 mg/dL 1.0  Glucose 70 - 528 mg/dL 94  Comment: Interpretive Data: Above is the NONFASTING reference range.  Below are the FASTING reference ranges: NORMAL:      70-99 mg/dL PREDIABETES: 413-244 mg/dL DIABETES:    > 010 mg/dL  Calcium 8.7 - 27.2 mg/dL 9.3  AST (Aspartate Aminotransferase) 15 - 41 U/L 24  ALT (Alanine Aminotransferase) 10 - 39 U/L 28  Bilirubin, Total 0.4 - 1.5 mg/dL 0.8  Alk Phos (Alkaline Phosphatase) 24 - 110 U/L 52  Albumin 3.5 - 4.8 g/dL 3.9  Protein, Total 6.2 - 8.1 g/dL 7.8  Anion Gap 3 - 12 mmol/L 9  BUN/CREA Ratio 6 - 27 15  Glomerular Filtration Rate (eGFR) mL/min/1.73sq m 67   She is taking amitriptyline 25 mg at bedtime , Trazodone 100 mg at bedtime, clonazepam 0.5 mg daily , Methocarbamol 500 mg 1.5 tab daily,and gabapentin 300 mg 2 caps at bedtime. These medications, she notes do not adequately control her pain.  States that she has been referred to pain management but they "will not see" her because the complexity of her medical hx. She has been dx'ed with fibromyalgia and hx of insomnia. Sleeps about 8 hours, interrupted..  She also reports an auditory processing disorder diagnosed by audiologist and has been referred to an ENT specialist for further evaluation and management. States that she  also has hx of deviated septum and was recommended surgical treatment in the past but she declined, she is open to do so now. Having nasal congestion.  "Borderline" hypothyroidism. She sees integrative medicine provider, Lindsey Rakers, MD ,and currently on hormonal therapy, including testosterone. Thyroid Profile (TSH and T4 Free) Order: [id number] Component Ref Range & Units 6 mo ago  Thyroid Stimulating Hormone (TSH) 0.34 - 5.66 IU/mL 1.26  Thyroxine, Free (FT4) 0.52 - 1.21 ng/dL 8.31 High    States that in the past she followed with neurologist but it was determined she did not need to continue neuro care. She has not seen psychiatrist.  Review of Systems  Constitutional:  Positive for fatigue. Negative for activity change, appetite change, chills and fever.  HENT:  Positive for congestion and rhinorrhea. Negative for mouth sores, nosebleeds, sore throat and trouble swallowing.   Eyes:  Negative for redness and visual disturbance.  Respiratory:  Negative for cough, shortness of breath and wheezing.   Cardiovascular:  Negative for chest pain and leg swelling.  Gastrointestinal:  Positive for abdominal pain (No more than usual, IBS). Negative for nausea and vomiting.       Negative for changes in bowel habits.  Endocrine: Negative for cold intolerance and heat intolerance.  Genitourinary:  Negative for decreased urine volume, difficulty urinating, dysuria and hematuria.  Musculoskeletal:  Positive for arthralgias and myalgias. Negative for gait problem.  Skin:  Negative for rash.  Allergic/Immunologic: Positive for environmental allergies.  Neurological:  Negative for syncope, facial asymmetry and weakness.  Psychiatric/Behavioral:  Positive for sleep disturbance. Negative for confusion and hallucinations. The patient is nervous/anxious.   See other pertinent positives and negatives in HPI.  Current Outpatient Medications on File Prior to Visit  Medication Sig Dispense Refill    albuterol (VENTOLIN HFA) 108 (90 Base) MCG/ACT inhaler Inhale 2 puffs into the lungs every 6 (six) hours as needed for wheezing or shortness of breath.     amitriptyline (ELAVIL) 25 MG tablet Take 25 mg by mouth at bedtime.     CLINPRO 5000 1.1 % PSTE Place onto teeth.     clonazePAM (KLONOPIN) 0.5 MG tablet Take 0.5 mg by mouth at bedtime.     cycloSPORINE (RESTASIS) 0.05 % ophthalmic emulsion Place 1 drop into both eyes 2 (two) times daily.     estradiol (VIVELLE-DOT) 0.1 MG/24HR patch Place 1 patch onto the skin 2 (two) times a week.     fexofenadine (ALLEGRA) 180 MG tablet Take 1 tablet (180 mg total) by mouth daily. 100 tablet 1   gabapentin (NEURONTIN) 300 MG capsule Take 2 capsules by mouth at bedtime.     Immune Globulin 10% (GAMUNEX-C) 10 GM/100ML SOLN Inject 30 g into the vein every 21 ( twenty-one) days.     methocarbamol (ROBAXIN) 500 MG tablet Take 1 and 1/2 tablet by mouth twice daily for pain.     montelukast (SINGULAIR) 10 MG tablet Take 1 tablet (10 mg total) by mouth at bedtime. 90 tablet 1   Peppermint Oil (IBGARD) 90 MG CPCR Take 1 capsule by mouth 2 (two) times daily. 180 capsule  3   progesterone (PROMETRIUM) 200 MG capsule Take 3 capsules by mouth at bedtime.     Testosterone 75 MG PLLT 87.mg every 3 months, insert 1 pellet every 3 months.     traZODone (DESYREL) 100 MG tablet Take 1-2 tablets by mouth at bedtime.     fludrocortisone (FLORINEF) 0.1 MG tablet Take 1 tablet (0.1 mg total) by mouth 2 (two) times daily. (Patient not taking: Reported on 11/04/2022) 60 tablet 4   No current facility-administered medications on file prior to visit.   Past Medical History:  Diagnosis Date   Allergy    Angio-edema    Arthritis    Asthma    Cancer (HCC) 10/25/2021   Carpal tunnel syndrome 07/07/2015   right   Chronic cough 12/02/2015   Chronic pain syndrome 08/10/2016   CVID (common variable immunodeficiency) (HCC)    Ehlers-Danlos syndrome    Essential alopecia of women  04/14/2011   Family history of breast cancer in female 08/26/2015   (x2) paternal 1st cousins dx in their 74s    Fibrocystic breast changes of both breasts 07/19/2015   Fibromyalgia    HLD (hyperlipidemia)    Hypermobility syndrome 11/26/2014   Based on Fam Hx and Beighton Score of 7 this is likely ED III    Hypertrophy of vulva 07/20/2017   Inflammatory dermatosis 04/14/2011   Irritable larynx syndrome 12/02/2015   Lumbar radiculopathy 02/16/2016   MGUS (monoclonal gammopathy of unknown significance)    Osteoarthritis 04/01/2017   Osteopenia    Osteoporosis 12/05/2017   Primary arthrosis of first carpometacarpal joints, bilateral 06/01/2015   Sacroiliac joint dysfunction of right side 11/26/2014   B SI joint xrays last done at [facility] Dr. Deanne Coffer 03/13/2017   Skin cancer    Allergies  Allergen Reactions   Poison Oak Extract [Poison Oak Extract] Anaphylaxis   Xiidra [Lifitegrast] Swelling    Eye swelling and blurred vision.    Levothyroxine Sodium    Ondansetron    Sulfa Antibiotics Nausea And Vomiting   Sulfamethoxazole-Trimethoprim    Tamoxifen Nausea Only and Other (See Comments)   Family History  Problem Relation Age of Onset   Hypertension Mother    Other Mother        hx of hysterectomy at 59-56 for prolapsed uterus; hx of benign L arm cyst in her 82s   Arthritis Mother    Hyperlipidemia Mother    Stroke Father    Hypertension Father    Skin cancer Father 31       NOS   Other Father        enlarged prostate s/p surgery   Alcohol abuse Father    Arthritis Father    Hyperlipidemia Father    Alzheimer's disease Maternal Aunt    Stomach cancer Paternal Aunt        d. 69; was a Clinical cytogeneticist and did not go to the doctor, so not sure age of onset   Congestive Heart Failure Maternal Grandmother    Hearing loss Maternal Grandmother    Prostate cancer Maternal Grandfather        dx. 75-76   Cancer Maternal Grandfather    Heart Problems Paternal  Grandmother    Heart Problems Paternal Grandfather    Colon cancer Other        maternal great aunt (MGM's sister) dx in her 21s   Heart attack Paternal Uncle 82   Heart Problems Paternal Uncle    Angina Paternal Uncle  Breast cancer Cousin        paternal 1st cousin d. 85   Breast cancer Cousin        paternal 1st cousin dx. 50s   Colon polyps Neg Hx    Esophageal cancer Neg Hx    Rectal cancer Neg Hx     Social History   Socioeconomic History   Marital status: Single    Spouse name: n/a   Number of children: 0   Years of education: Not on file   Highest education level: Not on file  Occupational History   Occupation: Pharmacologist    Comment: [employer]  Tobacco Use   Smoking status: Never   Smokeless tobacco: Never   Tobacco comments:    No history; N/A  Vaping Use   Vaping status: Never Used  Substance and Sexual Activity   Alcohol use: No   Drug use: No   Sexual activity: Not Currently    Birth control/protection: Abstinence, Post-menopausal  Other Topics Concern   Not on file  Social History Narrative   Lives with alone   R handed   Caffeine: 1-2 C of coffee in the AM.   Social Determinants of Health   Financial Resource Strain: High Risk (04/18/2022)   Received from [facility], [facility]   Overall Financial Resource Strain (CARDIA)    Difficulty of Paying Living Expenses: Hard  Food Insecurity: No Food Insecurity (10/04/2022)   Hunger Vital Sign    Worried About Running Out of Food in the Last Year: Never true    Ran Out of Food in the Last Year: Never true  Transportation Needs: No Transportation Needs (10/04/2022)   PRAPARE - Administrator, Civil Service (Medical): No    Lack of Transportation (Non-Medical): No  Physical Activity: Inactive (11/23/2021)   Received from [facility], [facility]   Exercise Vital Sign    Days of Exercise per Week: 0 days    Minutes of Exercise per  Session: 0 min  Stress: Stress Concern Present (10/19/2022)   [employer] of Occupational Health - Occupational Stress Questionnaire    Feeling of Stress : To some extent  Social Connections: Unknown (11/19/2021)   Received from [facility]   Social Network    Social Network: Not on file    Vitals:   11/04/22 1344  BP: 120/80  Pulse: 79  Resp: 12  Temp: 97.6 F (36.4 C)  SpO2: 97%   Body mass index is 24.27 kg/m.  Physical Exam Vitals and nursing note reviewed.  Constitutional:      General: She is not in acute distress.    Appearance: She is well-developed.  HENT:     Head: Normocephalic and atraumatic.     Mouth/Throat:     Mouth: Mucous membranes are moist.     Pharynx: Oropharynx is clear.  Eyes:     Conjunctiva/sclera: Conjunctivae normal.  Cardiovascular:     Rate and Rhythm: Normal rate and regular rhythm.     Pulses:          Dorsalis pedis pulses are 2+ on the right side and 2+ on the left side.     Heart sounds: No murmur heard. Pulmonary:     Effort: Pulmonary effort is normal. No respiratory distress.     Breath sounds: Normal breath sounds.  Abdominal:     Palpations: Abdomen is soft. There is no hepatomegaly or mass.     Tenderness:  There is no abdominal tenderness.  Musculoskeletal:     Right shoulder: Normal range of motion.     Left shoulder: Normal range of motion.     Cervical back: Tenderness present.     Thoracic back: Tenderness present.     Lumbar back: Tenderness present. No bony tenderness.     Comments: Right hand thenar and hypothenar muscle atrophy. Able to squat with no need for assistance.  Lymphadenopathy:     Cervical: No cervical adenopathy.  Skin:    General: Skin is warm.     Findings: No erythema or rash.  Neurological:     General: No focal deficit present.     Mental Status: She is alert and oriented to person, place, and time.     Cranial Nerves: No cranial nerve deficit.     Gait: Gait normal.  Psychiatric:         Mood and Affect: Mood and affect normal.   ASSESSMENT AND PLAN:  Ms. Kendis was seen today for establish care, referral and medication management.  Diagnoses and all orders for this visit:  Carpal tunnel syndrome of right wrist Assessment & Plan: S/P surgical treatment with residual mild weakness. Requested referral to occupations therapy, placed.  Orders: -     Ambulatory referral to Occupational Therapy  Postural orthostatic tachycardia syndrome (POTS) Assessment & Plan: Follows with cardiologist. States that she has been advised not to drive for 6 months after last episode of syncope but she is still doing so.   Fibromyalgia Assessment & Plan: She has been evaluated by rheumatologist, Dr Deanne Coffer. Rheumatologic work up otherwise negative. We discussed dx,prognosis,and treatment options. She is not sure if medications are helping, still having significant pain ,but doe snot want to change any treatment today. Continue Amitriptyline,Gabapentin,and Methocarbamol same doses. Some side effects discussed. Adequate sleep hygiene and low impact exercise. Will ask OT to evaluate for ADL's limitations. I do not think PCS's are needed but completed forms for initial evaluation. F/U in 3 months,  Orders: -     Ambulatory referral to Occupational Therapy  Insomnia due to medical condition Assessment & Plan: Continue Amitriptyline 25 mg at bedtime, Trazodone 100 mg at bedtime, clonazepam 0.5 mg at bedtime. Good sleep hygiene also recommended. F/U in 3 months.   Postural dizziness with presyncope Assessment & Plan: Possible etiologies discussed. A few of her chronic co morbilities and mediations could be contributing factors. According to pt,it has been suggested the possibility of adrenal insuf, has not had work-up done and has not started fludrocortisone. Endocrinology referral placed.  Orders: -     Ambulatory referral to Endocrinology  Deviated nasal septum Assessment &  Plan: Referral to ENT placed as requested.  Orders: -     Ambulatory referral to ENT  Monoclonal gammopathy Assessment & Plan: Continue following with hematologist at [facility], Dr Alvino Chapel.   I spent a total of 66 minutes in both face to face and non face to face activities for this visit on the date of this encounter. During this time history was obtained and documented, examination was performed, prior labs reviewed, and assessment/plan discussed.  Return in about 3 months (around 02/04/2023).  Zacari Stiff G. [place], MD  [facility]. [place] office.

## 2022-11-03 ENCOUNTER — Encounter: Payer: Self-pay | Admitting: Family Medicine

## 2022-11-04 ENCOUNTER — Ambulatory Visit: Payer: Medicaid Other | Admitting: Family Medicine

## 2022-11-04 ENCOUNTER — Other Ambulatory Visit (HOSPITAL_COMMUNITY): Payer: Self-pay

## 2022-11-04 ENCOUNTER — Encounter: Payer: Self-pay | Admitting: Family Medicine

## 2022-11-04 ENCOUNTER — Other Ambulatory Visit: Payer: Self-pay

## 2022-11-04 VITALS — BP 120/80 | HR 79 | Temp 97.6°F | Resp 12 | Ht 66.0 in | Wt 150.4 lb

## 2022-11-04 DIAGNOSIS — M797 Fibromyalgia: Secondary | ICD-10-CM | POA: Diagnosis not present

## 2022-11-04 DIAGNOSIS — G90A Postural orthostatic tachycardia syndrome (POTS): Secondary | ICD-10-CM

## 2022-11-04 DIAGNOSIS — R55 Syncope and collapse: Secondary | ICD-10-CM

## 2022-11-04 DIAGNOSIS — J342 Deviated nasal septum: Secondary | ICD-10-CM

## 2022-11-04 DIAGNOSIS — G4701 Insomnia due to medical condition: Secondary | ICD-10-CM

## 2022-11-04 DIAGNOSIS — G5601 Carpal tunnel syndrome, right upper limb: Secondary | ICD-10-CM | POA: Diagnosis not present

## 2022-11-04 DIAGNOSIS — R42 Dizziness and giddiness: Secondary | ICD-10-CM

## 2022-11-04 DIAGNOSIS — D472 Monoclonal gammopathy: Secondary | ICD-10-CM

## 2022-11-04 DIAGNOSIS — M533 Sacrococcygeal disorders, not elsewhere classified: Secondary | ICD-10-CM

## 2022-11-04 MED ORDER — LINZESS 145 MCG PO CAPS
ORAL_CAPSULE | ORAL | 3 refills | Status: DC
  Filled 2022-11-04: qty 90, 90d supply, fill #0

## 2022-11-04 NOTE — Patient Instructions (Addendum)
A few things to remember from today's visit:  Carpal tunnel syndrome of right wrist - Plan: Ambulatory referral to Occupational Therapy  Postural orthostatic tachycardia syndrome (POTS)  Sacroiliac joint dysfunction of right side  Fibromyalgia - Plan: Ambulatory referral to Occupational Therapy  Insomnia due to medical condition  Postural dizziness with presyncope - Plan: Ambulatory referral to Endocrinology  Deviated nasal septum - Plan: Ambulatory referral to ENT  No changes today. If you need refills for medications you take chronically, please call your pharmacy. Do not use My Chart to request refills or for acute issues that need immediate attention. If you send a my chart message, it may take a few days to be addressed, specially if I am not in the office.  Please be sure medication list is accurate. If a new problem present, please set up appointment sooner than planned today.

## 2022-11-05 DIAGNOSIS — R42 Dizziness and giddiness: Secondary | ICD-10-CM | POA: Insufficient documentation

## 2022-11-05 NOTE — Assessment & Plan Note (Signed)
Possible etiologies discussed. A few of her chronic co morbilities and mediations could be contributing factors. According to pt,it has been suggested the possibility of adrenal insuf, has not had work-up done and has not started fludrocortisone. Endocrinology referral placed.

## 2022-11-05 NOTE — Assessment & Plan Note (Signed)
Continue Amitriptyline 25 mg at bedtime, Trazodone 100 mg at bedtime, clonazepam 0.5 mg at bedtime. Good sleep hygiene also recommended. F/U in 3 months.

## 2022-11-05 NOTE — Assessment & Plan Note (Signed)
Referral to ENT placed as requested.

## 2022-11-05 NOTE — Assessment & Plan Note (Signed)
Continue following with hematologist at [facility], Dr Alvino Chapel.

## 2022-11-05 NOTE — Assessment & Plan Note (Signed)
She has been evaluated by rheumatologist, Dr Deanne Coffer. Rheumatologic work up otherwise negative. We discussed dx,prognosis,and treatment options. She is not sure if medications are helping, still having significant pain ,but doe snot want to change any treatment today. Continue Amitriptyline,Gabapentin,and Methotrexate same doses. Some side effects discussed. Adequate sleep hygiene and low impact exercise. Will ask OT to evaluate for ADL's limitations. I do not think PCS's are needed but completed forms for initial evaluation. F/U in 3 months,

## 2022-11-05 NOTE — Assessment & Plan Note (Signed)
S/P surgical treatment with residual mild weakness. Requested referral to occupations therapy, placed.

## 2022-11-05 NOTE — Assessment & Plan Note (Signed)
Follows with cardiologist. States that she has been advised not to drive for 6 months after last episode of syncope but she is still doing so.

## 2022-11-09 ENCOUNTER — Ambulatory Visit: Payer: Medicaid Other | Admitting: Licensed Clinical Social Worker

## 2022-11-10 ENCOUNTER — Other Ambulatory Visit: Payer: Medicaid Other

## 2022-11-10 ENCOUNTER — Ambulatory Visit: Payer: Medicaid Other

## 2022-11-10 NOTE — Patient Outreach (Signed)
Medicaid Managed Care Social Work Note  11/10/2022 Name:  Lindsey [name] MRN:  [id number] DOB:  1966/01/19  Lindsey [name] is an 57 y.o. year old female who is a primary patient of [facility] [place].  The Medicaid Managed Care Coordination team was consulted for assistance with:  Community Resources   Lindsey [name] was given information about Medicaid Managed Care Coordination team services today. Lindsey [name] Patient agreed to services and verbal consent obtained.  Engaged with patient  for by telephone forfollow up visit in response to referral for case management and/or care coordination services.   Assessments/Interventions:  Review of past medical history, allergies, medications, health status, including review of consultants reports, laboratory and other test data, was performed as part of comprehensive evaluation and provision of chronic care management services.  SDOH: (Social Determinant of Health) assessments and interventions performed: SDOH Interventions    Flowsheet Row Patient Outreach Telephone from 10/19/2022 in [place] POPULATION HEALTH DEPARTMENT Patient Outreach Telephone from 10/04/2022 in [place] POPULATION HEALTH DEPARTMENT  SDOH Interventions    Food Insecurity Interventions -- Intervention Not Indicated  Housing Interventions -- Intervention Not Indicated  Transportation Interventions -- Intervention Not Indicated  Utilities Interventions -- Intervention Not Indicated  Stress Interventions Offered [employer], Provide Counseling --     BSW completed a telephone outreach with patient, she did receive the resources BSW sent but there was a limit on the amount of repairs that can be done. Patient stated she did purchase a AC unit with a humidifier. Patient states no additional resources are needed at this time.  Advanced Directives Status:  Not addressed in this encounter.  Care Plan                 Allergies   Allergen Reactions   Poison Oak Extract [Poison Oak Extract] Anaphylaxis   Xiidra [Lifitegrast] Swelling    Eye swelling and blurred vision.    Levothyroxine Sodium    Ondansetron    Sulfa Antibiotics Nausea And Vomiting   Sulfamethoxazole-Trimethoprim    Tamoxifen Nausea Only and Other (See Comments)    Medications Reviewed Today   Medications were not reviewed in this encounter     Patient Active Problem List   Diagnosis Date Noted   Postural dizziness with presyncope 11/05/2022   Auditory processing disorder 09/22/2022   Addison's disease (HCC) 07/01/2022   Common variable immunodeficiency, unspecified (HCC) 12/02/2021   Postural orthostatic tachycardia syndrome (POTS) 11/25/2021   Vasovagal syncope 11/25/2021   MGUS (monoclonal gammopathy of unknown significance) 10/25/2021   Monoclonal gammopathy 10/25/2021   Acquired primary hypogammaglobulinemia (HCC) 09/10/2021   Carpal tunnel syndrome of right wrist 03/11/2021   Gluten intolerance 04/11/2020   Non-restorative sleep 01/14/2020   Menopausal and female climacteric states 01/14/2020   Insomnia due to medical condition 01/14/2020   Chronic right-sided low back pain 02/22/2018   Hypertrophy of vulva 07/20/2017   Deviated nasal septum 04/11/2017   Osteoarthritis 04/01/2017   Fibromyalgia 08/10/2016   Chronic pain syndrome 08/10/2016   Lumbar radiculopathy 02/16/2016   Hearing impairment 12/04/2015   Chronic cough 12/02/2015   Irritable larynx syndrome 12/02/2015   Genetic testing 09/21/2015   Family history of breast cancer in female 08/26/2015   Fibrocystic breast changes of both breasts 07/19/2015   Carpal tunnel syndrome 07/07/2015   Primary arthrosis of first carpometacarpal joints, bilateral 06/01/2015   Ehlers-Danlos syndrome 05/11/2015   Sacroiliac joint dysfunction of right side 11/26/2014   Hypermobility  syndrome 11/26/2014   Essential alopecia of women 04/14/2011   Inflammatory dermatosis 04/14/2011     Conditions to be addressed/monitored per PCP order:   community resources  There are no care plans that you recently modified to display for this patient.   Follow up:  Patient agrees to Care Plan and Follow-up.  Plan: The  Patient has been provided with contact information for the Managed Medicaid care management team and has been advised to call with any health related questions or concerns.    Lindsey [name], MHA [facility]  Managed [facility] Social Worker [phone]

## 2022-11-10 NOTE — Patient Instructions (Signed)
Visit Information  Lindsey [name] was given information about Medicaid Managed Care team care coordination services as a part of their Healthy [facility] Medicaid benefit. Lindsey [name] verbally consented to engagement with the [facility] Managed Care team.   If you are experiencing a medical emergency, please call 911 or report to your local emergency department or urgent care.   If you have a non-emergency medical problem during routine business hours, please contact your provider's office and ask to speak with a nurse.   For questions related to your Healthy [facility] health plan, please call: [phone] or visit the homepage here: [email or web address]  If you would like to schedule transportation through your Healthy [facility] plan, please call the following number at least 2 days in advance of your appointment: [phone]  For information about your ride after you set it up, call Ride Assist at [phone]. Use this number to activate a Will Call pickup, or if your transportation is late for a scheduled pickup. Use this number, too, if you need to make a change or cancel a previously scheduled reservation.  If you need transportation services right away, call [phone]. The after-hours call center is staffed 24 hours to handle ride assistance and urgent reservation requests (including discharges) 365 days a year. Urgent trips include sick visits, hospital discharge requests and life-sustaining treatment.  Call the [facility] Line at [phone], at any time, 24 hours a day, 7 days a week. If you are in danger or need immediate medical attention call 911.  If you would like help to quit smoking, call 1-800-QUIT-NOW (757-595-2905) OR Espaol: 1-855-Djelo-Ya (3-235-573-2202) o para ms informacin haga clic aqu or Text READY to 542-706 to register via text  [name] - following are the goals we discussed in your visit today:   Goals  Addressed   None     The  Patient                                              has been provided with contact information for the Managed Medicaid care management team and has been advised to call with any health related questions or concerns.   Gus Puma, Kenard Gower, MHA [facility]  Managed Medicaid Social Worker [phone]   Following is a copy of your plan of care:  There are no care plans that you recently modified to display for this patient.

## 2022-11-11 ENCOUNTER — Other Ambulatory Visit: Payer: Medicaid Other | Admitting: Licensed Clinical Social Worker

## 2022-11-11 NOTE — Patient Outreach (Signed)
Medicaid Managed Care Social Work Note  11/11/2022 Name:  Lindsey [name] MRN:  [id number] DOB:  February 23, 1966  Lindsey [name] is an 57 y.o. year old female who is a primary patient of [facility] [place].  The Medicaid Managed Care Coordination team was consulted for assistance with:  Mental Health Counseling and Resources  Lindsey [name] was given information about Medicaid Managed Care Coordination team services today. Lindsey [name] Patient agreed to services and verbal consent obtained.  Engaged with patient  for by telephone forfollow up visit in response to referral for case management and/or care coordination services.   Assessments/Interventions:  Review of past medical history, allergies, medications, health status, including review of consultants reports, laboratory and other test data, was performed as part of comprehensive evaluation and provision of chronic care management services.  SDOH: (Social Determinant of Health) assessments and interventions performed: SDOH Interventions    Flowsheet Row Patient Outreach Telephone from 11/11/2022 in [place] POPULATION HEALTH DEPARTMENT Patient Outreach Telephone from 10/19/2022 in [place] POPULATION HEALTH DEPARTMENT Patient Outreach Telephone from 10/04/2022 in [place] POPULATION HEALTH DEPARTMENT  SDOH Interventions     Food Insecurity Interventions -- -- Intervention Not Indicated  Housing Interventions -- -- Intervention Not Indicated  Transportation Interventions -- -- Intervention Not Indicated  Utilities Interventions -- -- Intervention Not Indicated  Stress Interventions Offered [employer] Resources, Provide Counseling Offered [employer] Resources, Provide Counseling --       Advanced Directives Status:  See Care Plan for related entries.  Care Plan                 Allergies  Allergen Reactions   Poison Oak Extract [Poison Oak Extract] Anaphylaxis   Xiidra [Lifitegrast]  Swelling    Eye swelling and blurred vision.    Levothyroxine Sodium    Ondansetron    Sulfa Antibiotics Nausea And Vomiting   Sulfamethoxazole-Trimethoprim    Tamoxifen Nausea Only and Other (See Comments)    Medications Reviewed Today   Medications were not reviewed in this encounter     Patient Active Problem List   Diagnosis Date Noted   Postural dizziness with presyncope 11/05/2022   Auditory processing disorder 09/22/2022   Addison's disease (HCC) 07/01/2022   Common variable immunodeficiency, unspecified (HCC) 12/02/2021   Postural orthostatic tachycardia syndrome (POTS) 11/25/2021   Vasovagal syncope 11/25/2021   MGUS (monoclonal gammopathy of unknown significance) 10/25/2021   Monoclonal gammopathy 10/25/2021   Acquired primary hypogammaglobulinemia (HCC) 09/10/2021   Carpal tunnel syndrome of right wrist 03/11/2021   Gluten intolerance 04/11/2020   Non-restorative sleep 01/14/2020   Menopausal and female climacteric states 01/14/2020   Insomnia due to medical condition 01/14/2020   Chronic right-sided low back pain 02/22/2018   Hypertrophy of vulva 07/20/2017   Deviated nasal septum 04/11/2017   Osteoarthritis 04/01/2017   Fibromyalgia 08/10/2016   Chronic pain syndrome 08/10/2016   Lumbar radiculopathy 02/16/2016   Hearing impairment 12/04/2015   Chronic cough 12/02/2015   Irritable larynx syndrome 12/02/2015   Genetic testing 09/21/2015   Family history of breast cancer in female 08/26/2015   Fibrocystic breast changes of both breasts 07/19/2015   Carpal tunnel syndrome 07/07/2015   Primary arthrosis of first carpometacarpal joints, bilateral 06/01/2015   Ehlers-Danlos syndrome 05/11/2015   Sacroiliac joint dysfunction of right side 11/26/2014   Hypermobility syndrome 11/26/2014   Essential alopecia of women 04/14/2011   Inflammatory dermatosis 04/14/2011    Conditions to be addressed/monitored per PCP  order:   Stress and Pain Management   Care Plan :  LCSW Plan of Care  Updates made by Lindsey Bryant, LCSW since 11/11/2022 12:00 AM     Problem: Stress management      Goal: Stress Symptoms Identified   Start Date: 10/19/2022  Note:   Short Term Goal High Risk  Start Date- 10/19/22 Follow up Date- 12/16/22 at 1:15 pm  Current Barriers:  Chronic Disease Management support and education needs related to Chronic Pain Mental Health Concerns regarding pain and lack of socialization and support. Patient does not wish to gain mental health support (had therapy in the past and prefers holistic approaches.)  Need for caregiver support   [facility] LCSW Clinical Goal(s):  Patient will verbalize understanding of plan for management of Stress and Chronic Pain as evidenced by patient reports work with Child psychotherapist to address Financial constraints related to limited income related to the management of chronic pain as evidenced by review of EMR and patient or Child psychotherapist report through collaboration with Medical illustrator, provider, and care team.    Interventions: Inter-disciplinary care team collaboration (see longitudinal plan of care) Evaluation of current treatment plan related to  self management and patient's adherence to plan as established by provider Advised patient to contact [facility] [employer] [phone] for member benefits Encouraged pt to continue daily stretching and spend time in the sunshine and outdoors Discussed importance of adherence to all scheduled medical appointments; Advised patient to report to care team affect of pain on daily activities; Discussed use of relaxation techniques and/or diversional activities to assist with pain reduction (distraction, imagery, relaxation, massage, acupressure, TENS, heat, and cold application; Advised patient to discuss PCS with provider; patient is interested in caregiver support resources  Patient was encouraged to continue her "pet therapy" Assessed social determinant of health  barriers;  BSW involved for financial resources Pharmacy initial appointment scheduled for medication review Advised patient to write down any concerns or questions to discuss with PCP Update- Patient reports that she continues to have chronic pain which affects her daily mood. She does not wish to pursue therapy as "talking about my problems actually makes it worse." She does not wish to pursue mental health treatment at this time. Brief pain management/depression management coping skill education provided during session today on 11/11/22.    Patient Goals/Self-Care Activities: Attend all scheduled provider appointments Call provider office for new concerns or questions  Work with the [facility] RNCM and social worker to address care coordination needs and will continue to work with the clinical team to address health care and disease management related needs call the [place] National Suicide Prevention Lifeline: (229)432-9073 or TTY: 725-861-7870 TTY 209 153 3106) to talk to a trained counselor call 1-800-273-TALK (toll free, 24 hour hotline) go to [facility] [address], [place] [phone]) if experiencing a Mental Health or Behavioral Health Crisis              10 LITTLE Things To Do When You're Feeling Too Down To Do Anything  Take a shower. Even if you plan to stay in all day long and not see a soul, take a shower. It takes the most effort to hop in to the shower but once you do, you'll feel immediate results. It will wake you up and you'll be feeling much fresher (and cleaner too).  Brush and floss your teeth. Give your teeth a good brushing with a floss finish. It's a small task  but it feels so good and you can check 'taking care of your health' off the list of things to do.  Do something small on your list. Most of [place] have some small thing we would like to get done (load of laundry, sew a button, email a friend). Doing one of these things will  make you feel like you've accomplished something.  Drink water. Drinking water is easy right? It's also really beneficial for your health so keep a glass beside you all day and take sips often. It gives you energy and prevents you from boredom eating.  Do some floor exercises. The last thing you want to do is exercise but it might be just the thing you need the most. Keep it simple and do exercises that involve sitting or laying on the floor. Even the smallest of exercises release chemicals in the brain that make you feel good. Yoga stretches or core exercises are going to make you feel good with minimal effort.  Make your bed. Making your bed takes a few minutes but it's productive and you'll feel relieved when it's done. An unmade bed is a huge visual reminder that you're having an unproductive day. Do it and consider it your housework for the day.  Put on some nice clothes. Take the sweatpants off even if you don't plan to go anywhere. Put on clothes that make you feel good. Take a look in the mirror so your brain recognizes the sweatpants have been replaced with clothes that make you look great. It's an instant confidence booster.  Wash the dishes. A pile of dirty dishes in the sink is a reflection of your mood. It's possible that if you wash up the dishes, your mood will follow suit. It's worth a try.  Cook a real meal. If you have the luxury to have a "do nothing" day, you have time to make a real meal for yourself. Make a meal that you love to eat. The process is good to get you out of the funk and the food will ensure you have more energy for tomorrow.  Write out your thoughts by hand. When you hand write, you stimulate your brain to focus on the moment that you're in so make yourself comfortable and write whatever comes into your mind. Put those thoughts out on paper so they stop spinning around in your head. Those thoughts might be the very thing holding you down.     24- Hour  Availability:    [facility]  [address] [place], [place] [phone] Crisis [phone]   [facility] [phone]  [place] [facility]  [phone]    [facility]  [phone] (after hours)   Therapeutic Alternative/Mobile Crisis   [phone]   [place] National Suicide Hotline  808-587-8509 Len Child[place]) [place] 884   Call [phone] for mental health emergencies   [facility]  [phone]);  [place] and [employer]  [phone]); [place], [place], [place], [place], [place], [place], [place]    [facility] for [facility] Residents For 24/7 walk-up access to mental health services for [facility] children (4+), adolescents and adults, please visit the [facility] located at [address] in [place], [place].  *[facility] also provides comprehensive outpatient behavioral health services in a variety of locations around the [place].  Connect With [place] [address] [place], [place] [address] HelpLine: 4316752247  or [phone]  Get Directions  Find Help 24/7 By Phone Call our 24-hour HelpLine at (305) 432-3758 or (531) 357-3537 for immediate assistance for mental health and substance abuse issues.  Walk-In Help [place]: [facility] (Ages 4 and Up) [place]: Emergency Dept., [facility] Additional Resources National Hopeline Network: 1-800-SUICIDE The National Suicide Prevention Lifeline: 1-800-273-TALK        11/04/2022    1:52 PM 12/25/2017    5:35 PM 07/21/2017   11:52 AM 12/20/2016    9:50 AM 08/01/2016    4:29 PM  Depression screen PHQ 2/9  Decreased Interest 0 0 0 0 0  Down, Depressed, Hopeless 0 0 0 0 0  PHQ - 2 Score 0 0 0 0 0   Follow up:  Patient agrees to Care Plan and Follow-up.  Plan: The Managed Medicaid care  management team will reach out to the patient again over the next 30 days.  Dickie La, BSW, MSW, [employer] Managed Medicaid LCSW [facility]  [facility] [email or web address] Phone: [phone]

## 2022-11-11 NOTE — Patient Instructions (Signed)
Visit Information  [name] was given information about Medicaid Managed Care team care coordination services as a part of their Healthy [facility] Medicaid benefit. Lindsey [name] verbally consented to engagement with the [facility] Managed Care team.   If you are experiencing a medical emergency, please call 911 or report to your local emergency department or urgent care.   If you have a non-emergency medical problem during routine business hours, please contact your provider's office and ask to speak with a nurse.   For questions related to your Healthy [facility] health plan, please call: [phone] or visit the homepage here: [email or web address]  If you would like to schedule transportation through your Healthy [facility] plan, please call the following number at least 2 days in advance of your appointment: [phone]  For information about your ride after you set it up, call Ride Assist at [phone]. Use this number to activate a Will Call pickup, or if your transportation is late for a scheduled pickup. Use this number, too, if you need to make a change or cancel a previously scheduled reservation.  If you need transportation services right away, call [phone]. The after-hours call center is staffed 24 hours to handle ride assistance and urgent reservation requests (including discharges) 365 days a year. Urgent trips include sick visits, hospital discharge requests and life-sustaining treatment.  Call the [facility] Line at [phone], at any time, 24 hours a day, 7 days a week. If you are in danger or need immediate medical attention call 911.  If you would like help to quit smoking, call 1-800-QUIT-NOW (310-005-9104) OR Espaol: 1-855-Djelo-Ya (4-742-595-6387) o para ms informacin haga clic aqu or Text READY to 564-332 to register via text  Following is a copy of your plan of care:  Care Plan : LCSW Plan of Care   Updates made by Lindsey Bryant, LCSW since 11/11/2022 12:00 AM     Problem: Stress management      Goal: Stress Symptoms Identified   Start Date: 10/19/2022  Note:   Short Term Goal High Risk  Start Date- 10/19/22 Follow up Date- 12/16/22 at 1:15 pm  Current Barriers:  Chronic Disease Management support and education needs related to Chronic Pain Mental Health Concerns regarding pain and lack of socialization and support. Patient does not wish to gain mental health support (had therapy in the past and prefers holistic approaches.)  Need for caregiver support   [facility] LCSW Clinical Goal(s):  Patient will verbalize understanding of plan for management of Stress and Chronic Pain as evidenced by patient reports work with Child psychotherapist to address Financial constraints related to limited income related to the management of chronic pain as evidenced by review of EMR and patient or Child psychotherapist report through collaboration with Medical illustrator, provider, and care team.     Patient Goals/Self-Care Activities: Attend all scheduled provider appointments Call provider office for new concerns or questions  Work with the [facility] RNCM and social worker to address care coordination needs and will continue to work with the clinical team to address health care and disease management related needs call the [place] National Suicide Prevention Lifeline: 207-129-4906 or TTY: (906)345-1626 TTY (314) 576-1302) to talk to a trained counselor call 1-800-273-TALK (toll free, 24 hour hotline) go to [facility] [address] [place] [phone]) if experiencing a Mental Health or Behavioral Health Crisis     Lindsey [name], BSW, MSW, LCSW Managed Medicaid LCSW [facility]  [email or web address]  Phone: [phone]

## 2022-11-14 ENCOUNTER — Other Ambulatory Visit (HOSPITAL_COMMUNITY): Payer: Self-pay

## 2022-11-14 MED ORDER — PROGESTERONE 200 MG PO CAPS
600.0000 mg | ORAL_CAPSULE | Freq: Every evening | ORAL | 2 refills | Status: DC
  Filled 2022-11-14 – 2022-11-18 (×2): qty 270, 90d supply, fill #0

## 2022-11-15 ENCOUNTER — Other Ambulatory Visit (HOSPITAL_COMMUNITY): Payer: Self-pay

## 2022-11-16 ENCOUNTER — Other Ambulatory Visit (HOSPITAL_COMMUNITY): Payer: Self-pay

## 2022-11-16 ENCOUNTER — Other Ambulatory Visit (HOSPITAL_BASED_OUTPATIENT_CLINIC_OR_DEPARTMENT_OTHER): Payer: Self-pay

## 2022-11-16 ENCOUNTER — Other Ambulatory Visit: Payer: Medicaid Other | Admitting: *Deleted

## 2022-11-16 MED ORDER — LINZESS 72 MCG PO CAPS
72.0000 ug | ORAL_CAPSULE | Freq: Every day | ORAL | 2 refills | Status: DC
  Filled 2022-11-16 – 2022-11-17 (×2): qty 90, 90d supply, fill #0
  Filled 2023-07-31: qty 90, 90d supply, fill #1

## 2022-11-16 NOTE — Patient Outreach (Signed)
Medicaid Managed Care   Nurse Care Manager Note  11/16/2022 Name:  Lindsey [name] MRN:  [id number] DOB:  03/04/1966  Lindsey [name] is an 57 y.o. year old female who is a primary patient of [place], Timoteo Expose, MD.  The [facility] Managed Care Coordination team was consulted for assistance with:    Chronic Pain  Lindsey [name] was given information about Medicaid Managed Care Coordination team services today. Lindsey [name] Patient agreed to services and verbal consent obtained.  Engaged with patient by telephone for follow up visit in response to provider referral for case management and/or care coordination services.   Assessments/Interventions:  Review of past medical history, allergies, medications, health status, including review of consultants reports, laboratory and other test data, was performed as part of comprehensive evaluation and provision of chronic care management services.  SDOH (Social Determinants of Health) assessments and interventions performed: SDOH Interventions    Flowsheet Row Patient Outreach Telephone from 11/11/2022 in [place] POPULATION HEALTH DEPARTMENT Patient Outreach Telephone from 10/19/2022 in [place] POPULATION HEALTH DEPARTMENT Patient Outreach Telephone from 10/04/2022 in [place] POPULATION HEALTH DEPARTMENT  SDOH Interventions     Food Insecurity Interventions -- -- Intervention Not Indicated  Housing Interventions -- -- Intervention Not Indicated  Transportation Interventions -- -- Intervention Not Indicated  Utilities Interventions -- -- Intervention Not Indicated  Stress Interventions Offered [employer], Provide Counseling Offered [employer], Provide Counseling --       Care Plan  Allergies  Allergen Reactions   Poison Oak Extract [Poison Oak Extract] Anaphylaxis   Xiidra [Lifitegrast] Swelling    Eye swelling and blurred vision.    Levothyroxine Sodium    Ondansetron    Sulfa Antibiotics Nausea And  Vomiting   Sulfamethoxazole-Trimethoprim    Tamoxifen Nausea Only and Other (See Comments)    Medications Reviewed Today   Medications were not reviewed in this encounter     Patient Active Problem List   Diagnosis Date Noted   Postural dizziness with presyncope 11/05/2022   Auditory processing disorder 09/22/2022   Addison's disease (HCC) 07/01/2022   Common variable immunodeficiency, unspecified (HCC) 12/02/2021   Postural orthostatic tachycardia syndrome (POTS) 11/25/2021   Vasovagal syncope 11/25/2021   MGUS (monoclonal gammopathy of unknown significance) 10/25/2021   Monoclonal gammopathy 10/25/2021   Acquired primary hypogammaglobulinemia (HCC) 09/10/2021   Carpal tunnel syndrome of right wrist 03/11/2021   Gluten intolerance 04/11/2020   Non-restorative sleep 01/14/2020   Menopausal and female climacteric states 01/14/2020   Insomnia due to medical condition 01/14/2020   Chronic right-sided low back pain 02/22/2018   Hypertrophy of vulva 07/20/2017   Deviated nasal septum 04/11/2017   Osteoarthritis 04/01/2017   Fibromyalgia 08/10/2016   Chronic pain syndrome 08/10/2016   Lumbar radiculopathy 02/16/2016   Hearing impairment 12/04/2015   Chronic cough 12/02/2015   Irritable larynx syndrome 12/02/2015   Genetic testing 09/21/2015   Family history of breast cancer in female 08/26/2015   Fibrocystic breast changes of both breasts 07/19/2015   Carpal tunnel syndrome 07/07/2015   Primary arthrosis of first carpometacarpal joints, bilateral 06/01/2015   Ehlers-Danlos syndrome 05/11/2015   Sacroiliac joint dysfunction of right side 11/26/2014   Hypermobility syndrome 11/26/2014   Essential alopecia of women 04/14/2011   Inflammatory dermatosis 04/14/2011    Conditions to be addressed/monitored per PCP order:   chronic pain  Care Plan : RN Care Manager Plan of Care  Updates made by Heidi Dach, RN since 11/16/2022 12:00  AM     Problem: Health Management needs  related to Chronic Pain      Long-Range Goal: Development of Plan of Care to address Health Management needs related to Chronic Pain   Start Date: 10/11/2022  Expected End Date: 01/09/2023  Note:   Current Barriers:  Chronic Disease Management support and education needs related to Chronic Pain  RNCM Clinical Goal(s):  Patient will verbalize understanding of plan for management of Chronic Pain as evidenced by patient reports attend all scheduled medical appointments: 11/23/22 with Pharmacy, 11/30/22 for OT evaluation as evidenced by provider documentation        work with pharmacist to address Medication concerns related to chronic pain as evidenced by review of EMR and patient or pharmacist report    work with Child psychotherapist to address Financial constraints related to limited income related to the management of chronic pain as evidenced by review of EMR and patient or Child psychotherapist report     through collaboration with Medical illustrator, provider, and care team.   Interventions: Inter-disciplinary care team collaboration (see longitudinal plan of care) Evaluation of current treatment plan related to  self management and patient's adherence to plan as established by provider Advised patient to contact [employer] [phone] for member benefits    Pain:  (Status: Goal on Track (progressing): YES.) Long Term Goal  Pain assessment performed Medications reviewed Reviewed provider established plan for pain management; Discussed importance of adherence to all scheduled medical appointments; Advised patient to report to care team affect of pain on daily activities; Discussed use of relaxation techniques and/or diversional activities to assist with pain reduction (distraction, imagery, relaxation, massage, acupressure, TENS, heat, and cold application; Reviewed with patient prescribed pharmacological and nonpharmacological pain relief strategies; Assessed social determinant of  health barriers;  Discussed upcoming appointment with Pharmacist, advised to have medications available during visit Advised patient to follow up with PCP for updates on referrals Advised patient to write down any concerns or questions to discuss with PCP Reviewed provider note from Pain Management consult and discussed  Patient Goals/Self-Care Activities: Attend all scheduled provider appointments Call provider office for new concerns or questions  Work with the social worker to address care coordination needs and will continue to work with the clinical team to address health care and disease management related needs call the [place] National Suicide Prevention Lifeline: (919) 380-4155 or TTY: (586)249-7189 TTY 513-857-1031) to talk to a trained counselor call 1-800-273-TALK (toll free, 24 hour hotline) go to [facility] [address], [place] ([phone]) if experiencing a Mental Health or Behavioral Health Crisis        Follow Up:  Patient agrees to Care Plan and Follow-up.  Plan: The Managed Medicaid care management team will reach out to the patient again over the next 60 days.  Date/time of next scheduled RN care management/care coordination outreach:  01/16/23 @ 9am  Estanislado Emms RN, BSN [place]  Managed Medicaid RN Care Coordinator [phone]

## 2022-11-16 NOTE — Patient Instructions (Signed)
Visit Information  Lindsey [name] was given information about Medicaid Managed Care team care coordination services as a part of their Healthy [facility] Medicaid benefit. Lindsey [name] verbally consented to engagement with the [facility] Managed Care team.   If you are experiencing a medical emergency, please call 911 or report to your local emergency department or urgent care.   If you have a non-emergency medical problem during routine business hours, please contact your provider's office and ask to speak with a nurse.   For questions related to your Healthy [facility] health plan, please call: [phone] or visit the homepage here: [email or web address]  If you would like to schedule transportation through your Healthy [facility] plan, please call the following number at least 2 days in advance of your appointment: [phone]  For information about your ride after you set it up, call Ride Assist at [phone]. Use this number to activate a Will Call pickup, or if your transportation is late for a scheduled pickup. Use this number, too, if you need to make a change or cancel a previously scheduled reservation.  If you need transportation services right away, call [phone]. The after-hours call center is staffed 24 hours to handle ride assistance and urgent reservation requests (including discharges) 365 days a year. Urgent trips include sick visits, hospital discharge requests and life-sustaining treatment.  Call the [facility] Line at [phone], at any time, 24 hours a day, 7 days a week. If you are in danger or need immediate medical attention call 911.  If you would like help to quit smoking, call 1-800-QUIT-NOW ((671)733-3512) OR Espaol: 1-855-Djelo-Ya (4-742-595-6387) o para ms informacin haga clic aqu or Text READY to 564-332 to register via text  Lindsey [name],   Please [name] education materials related to Health Maintenance  provided by MyChart link.  Patient verbalizes understanding of instructions and care plan provided today and agrees to view in MyChart. Active MyChart status and patient understanding of how to access instructions and care plan via MyChart confirmed with patient.     Telephone follow up appointment with Managed Medicaid care management team member scheduled for:01/16/23 @ 9am  Estanislado Emms RN, BSN [place]  Managed [facility] RN Care Coordinator [phone]   Following is a copy of your plan of care:  Care Plan : RN Care Manager Plan of Care  Updates made by Heidi Dach, RN since 11/16/2022 12:00 AM     Problem: Health Management needs related to Chronic Pain      Long-Range Goal: Development of Plan of Care to address Health Management needs related to Chronic Pain   Start Date: 10/11/2022  Expected End Date: 01/09/2023  Note:   Current Barriers:  Chronic Disease Management support and education needs related to Chronic Pain  RNCM Clinical Goal(s):  Patient will verbalize understanding of plan for management of Chronic Pain as evidenced by patient reports attend all scheduled medical appointments: 11/23/22 with Pharmacy, 11/30/22 for OT evaluation as evidenced by provider documentation        work with pharmacist to address Medication concerns related to chronic pain as evidenced by review of EMR and patient or pharmacist report    work with Child psychotherapist to address Financial constraints related to limited income related to the management of chronic pain as evidenced by review of EMR and patient or Child psychotherapist report     through collaboration with Medical illustrator, provider, and care team.   Interventions: Inter-disciplinary care team collaboration ([name] longitudinal plan  of care) Evaluation of current treatment plan related to  self management and patient's adherence to plan as established by provider Advised patient to contact [facility] [phone] for  member benefits    Pain:  (Status: Goal on Track (progressing): YES.) Long Term Goal  Pain assessment performed Medications reviewed Reviewed provider established plan for pain management; Discussed importance of adherence to all scheduled medical appointments; Advised patient to report to care team affect of pain on daily activities; Discussed use of relaxation techniques and/or diversional activities to assist with pain reduction (distraction, imagery, relaxation, massage, acupressure, TENS, heat, and cold application; Reviewed with patient prescribed pharmacological and nonpharmacological pain relief strategies; Assessed social determinant of health barriers;  Discussed upcoming appointment with Pharmacist, advised to have medications available during visit Advised patient to follow up with PCP for updates on referrals Advised patient to write down any concerns or questions to discuss with PCP Reviewed provider note from Pain Management consult and discussed  Patient Goals/Self-Care Activities: Attend all scheduled provider appointments Call provider office for new concerns or questions  Work with the social worker to address care coordination needs and will continue to work with the clinical team to address health care and disease management related needs call the [place] National Suicide Prevention Lifeline: (617)064-3582 or TTY: 323-844-4663 TTY (208) 849-3994) to talk to a trained counselor call 1-800-273-TALK (toll free, 24 hour hotline) go to [facility] [address] [place] [phone]) if experiencing a Mental Health or Behavioral Health Crisis

## 2022-11-17 ENCOUNTER — Other Ambulatory Visit: Payer: Self-pay

## 2022-11-17 ENCOUNTER — Other Ambulatory Visit (HOSPITAL_COMMUNITY): Payer: Self-pay

## 2022-11-17 ENCOUNTER — Other Ambulatory Visit: Payer: Self-pay | Admitting: Family Medicine

## 2022-11-17 MED ORDER — TRAZODONE HCL 100 MG PO TABS
100.0000 mg | ORAL_TABLET | Freq: Every day | ORAL | 2 refills | Status: DC
  Filled 2022-11-17: qty 135, 67d supply, fill #0

## 2022-11-18 ENCOUNTER — Other Ambulatory Visit (HOSPITAL_BASED_OUTPATIENT_CLINIC_OR_DEPARTMENT_OTHER): Payer: Self-pay

## 2022-11-18 ENCOUNTER — Encounter: Payer: Self-pay | Admitting: Family Medicine

## 2022-11-18 ENCOUNTER — Other Ambulatory Visit (HOSPITAL_COMMUNITY): Payer: Self-pay

## 2022-11-21 ENCOUNTER — Other Ambulatory Visit (HOSPITAL_COMMUNITY): Payer: Self-pay

## 2022-11-21 ENCOUNTER — Other Ambulatory Visit (HOSPITAL_BASED_OUTPATIENT_CLINIC_OR_DEPARTMENT_OTHER): Payer: Self-pay

## 2022-11-22 ENCOUNTER — Other Ambulatory Visit: Payer: Self-pay

## 2022-11-22 ENCOUNTER — Other Ambulatory Visit (HOSPITAL_COMMUNITY): Payer: Self-pay

## 2022-11-22 ENCOUNTER — Telehealth: Payer: Self-pay

## 2022-11-22 MED ORDER — PROGESTERONE 200 MG PO CAPS
600.0000 mg | ORAL_CAPSULE | Freq: Every evening | ORAL | 1 refills | Status: DC
  Filled 2022-11-22: qty 90, 30d supply, fill #0
  Filled 2023-01-30: qty 90, 30d supply, fill #1

## 2022-11-22 MED ORDER — GABAPENTIN 300 MG PO CAPS
600.0000 mg | ORAL_CAPSULE | Freq: Every day | ORAL | 5 refills | Status: DC
  Filled 2022-11-22: qty 60, 30d supply, fill #0
  Filled 2023-01-30: qty 60, 30d supply, fill #1
  Filled 2023-02-27 – 2023-03-06 (×2): qty 60, 30d supply, fill #2
  Filled 2023-04-26: qty 60, 30d supply, fill #3
  Filled 2023-06-26: qty 60, 30d supply, fill #4

## 2022-11-22 MED ORDER — AMITRIPTYLINE HCL 25 MG PO TABS
25.0000 mg | ORAL_TABLET | Freq: Every day | ORAL | 2 refills | Status: DC
  Filled 2022-11-22 – 2023-01-30 (×2): qty 90, 90d supply, fill #0
  Filled 2023-07-03: qty 90, 90d supply, fill #1

## 2022-11-22 MED ORDER — METHOCARBAMOL 500 MG PO TABS
750.0000 mg | ORAL_TABLET | Freq: Two times a day (BID) | ORAL | 1 refills | Status: DC
  Filled 2022-11-22: qty 90, 30d supply, fill #0
  Filled 2023-01-10: qty 90, 30d supply, fill #1
  Filled 2023-01-30 – 2023-02-04 (×2): qty 90, 30d supply, fill #2
  Filled 2023-03-10: qty 90, 30d supply, fill #3
  Filled 2023-04-26: qty 90, 30d supply, fill #4
  Filled 2023-05-21: qty 90, 30d supply, fill #5

## 2022-11-22 NOTE — Telephone Encounter (Signed)
Patient's previous OB is no longer in network with her insurance and the cost of her Progesterone Rx will be over $200. Pt is asking if we can send in a prescription to get her through until her appointment on 9/30 with her new OB. Okay to send in?

## 2022-11-22 NOTE — Telephone Encounter (Signed)
Telephone message sent to Dr. Clent Ridges to see about Progesterone.

## 2022-11-22 NOTE — Telephone Encounter (Signed)
Yes please call this in

## 2022-11-22 NOTE — Telephone Encounter (Signed)
Rx sent 

## 2022-11-22 NOTE — Addendum Note (Signed)
Addended by: Kathreen Devoid on: 11/22/2022 09:44 AM   Modules accepted: Orders

## 2022-11-23 ENCOUNTER — Other Ambulatory Visit: Payer: Medicaid Other | Admitting: Pharmacist

## 2022-11-23 ENCOUNTER — Other Ambulatory Visit (HOSPITAL_COMMUNITY): Payer: Self-pay

## 2022-11-23 ENCOUNTER — Other Ambulatory Visit (HOSPITAL_BASED_OUTPATIENT_CLINIC_OR_DEPARTMENT_OTHER): Payer: Self-pay

## 2022-11-23 MED ORDER — PROGESTERONE 200 MG PO CAPS
600.0000 mg | ORAL_CAPSULE | Freq: Every day | ORAL | 0 refills | Status: DC
  Filled 2022-11-23 – 2023-01-02 (×2): qty 90, 30d supply, fill #0

## 2022-11-23 NOTE — Progress Notes (Signed)
 I have reviewed the pharmacist's encounter and agree with their documentation.   Catie Eppie Gibson, PharmD, BCACP, CPP [facility] [phone]

## 2022-11-23 NOTE — Progress Notes (Signed)
11/23/2022 Name: Lindsey [name] MRN: [id number] DOB: Aug 31, 1965  No chief complaint on file.   Lindsey [name] is a 57 y.o. year old female who presented for a telephone visit.   They were referred to the pharmacist by their Case Management Team  for assistance in managing complex medication management; pt requested med review and has questions about interactions.   Subjective: Having issues with breathing - using albuterol frequently. Trying to get nebulizer. Has tried other inhalers - but they cause thrush. "Do not work for her". Feels very dried out from antihistaminic agents   Has referral to endocrinology to test for adrenal insufficiency - has vasovagal syncope every day. Integrative medicine prescribed fludrocortisone, but would like to have blood work completed first - so she is waiting to be scheduled with endocrinology  8/2 > switched IVIG to weekly Hizentra: stomach swelled up/tightness for 5 days. Has appt with immunologist on 8/20 to decide about future treatment plan.  Had tried to start a supplement to for weight loss - Meratrim. It gave her diarrhea. Stopped taking, and she is back on linaclotide daily.  Testosterone is from integrative medicine for fatigue.Was previously on 50 or more supplements from integrative medicine - took her over an hour to take all the pills. Now occasionally takes Vitamin C, but that is all. Was previously spending $1000/month.   Preferred pharmacy is [facility]  Care Team: Primary Care Provider: [place], Betty G, MD ; Next Scheduled Visit: not scheduled  Medication Access/Adherence  Current Pharmacy:  [facility] [address] [place] [address] Phone: [phone] Fax: [phone]   Patient reports affordability concerns with their medications: No  Patient reports access/transportation concerns to their pharmacy: No  Patient reports adherence concerns with their  medications:  No    Objective:  No results found for: "HGBA1C"  Lab Results  Component Value Date   CREATININE 0.97 02/02/2022   [name] 11 02/02/2022   NA 137 02/02/2022   K 4.1 02/02/2022   CL 105 02/02/2022   CO2 19 (L) 02/02/2022    No results found for: "CHOL", "HDL", "LDLCALC", "LDLDIRECT", "TRIG", "CHOLHDL"  Medications Reviewed Today     Reviewed by Particia Lather, RPH (Pharmacist) on 11/23/22 at [phone]  Med List Status: <None>   Medication Order Taking? Sig Documenting Provider Last Dose Status Informant  albuterol (VENTOLIN HFA) 108 (90 Base) MCG/ACT inhaler [id number] Yes Inhale 2 puffs into the lungs every 6 (six) hours as needed for wheezing or shortness of breath. [provider] Taking Active            Med Note Particia Lather   Wed Nov 23, 2022  9:36 AM) Using 3-4 puffs 3x daily  amitriptyline (ELAVIL) 25 MG tablet [id number] Yes Take 1 tablet (25 mg total) by mouth at bedtime. [place], Betty G, MD Taking Active   CLINPRO 5000 1.1 % PSTE [id number] Yes Place onto teeth. [provider] Taking Active   clonazePAM (KLONOPIN) 0.5 MG tablet [id number] Yes Take 0.5 mg by mouth at bedtime. [provider] Taking Active   cycloSPORINE (RESTASIS) 0.05 % ophthalmic emulsion [id number] Yes Place 1 drop into both eyes 2 (two) times daily. [provider] Taking Active   estradiol (VIVELLE-DOT) 0.1 MG/24HR patch [id number] Yes Place 1 patch onto the skin 2 (two) times a week. [provider] Taking Active   fexofenadine (ALLEGRA) 180 MG tablet [id number] Yes Take 1 tablet (  180 mg total) by mouth daily. Marcelyn Bruins, MD Taking Active            Med Note Particia Lather   Wed Nov 23, 2022  9:39 AM) Taking PRN  fludrocortisone (FLORINEF) 0.1 MG tablet [id number] No Take 1 tablet (0.1 mg total) by mouth 2 (two) times daily.  Patient not taking: Reported on 11/04/2022    Not Taking Active            Med Note Particia Lather   Wed Nov 23, 2022   9:41 AM) Not taking - waiting to see endocrinologist  gabapentin (NEURONTIN) 300 MG capsule [id number] Yes Take 2 capsules (600 mg total) by mouth at bedtime. [place], Betty G, MD Taking Active   Immune Globulin 10% (GAMUNEX-C) 10 GM/100ML SOLN [id number] Yes Inject 30 g into the vein every 21 ( twenty-one) days. [provider] Taking Active   Immune Globulin, Human, (HIZENTRA) 10 GM/50ML SOLN [id number] Yes Inject 10 g into the skin once a week. [provider]  Active            Med Note Particia Lather   Wed Nov 23, 2022  9:45 AM) Rochele Pages once on 8/2 > potentially switching from IVIG to subQ IG (Hizentra)  linaclotide (LINZESS) 72 MCG capsule [id number] Yes Take 1 capsule (72 mcg total) by mouth daily.  Taking Active   methocarbamol (ROBAXIN) 500 MG tablet [id number] Yes Take 1.5 tablets (750 mg total) by mouth 2 (two) times daily for pain. [place], Betty G, MD Taking Active            Med Note Particia Lather   Wed Nov 23, 2022  9:49 AM) Taking 3 times a day - tries not take if driving.   montelukast (SINGULAIR) 10 MG tablet [id number] Yes Take 1 tablet (10 mg total) by mouth at bedtime. Marcelyn Bruins, MD Taking Active   progesterone (PROMETRIUM) 200 MG capsule [id number] Yes Take 3 capsules (600 mg total) by mouth at bedtime. Nelwyn Salisbury, MD Taking Active   Testosterone 75 MG PLLT [id number] Yes 87.mg every 3 months, insert 1 pellet every 3 months. [provider] Taking Active   traZODone (DESYREL) 100 MG tablet [id number] Yes Take 1-2 tablets (100-200 mg total) by mouth at bedtime for insomnia.  Taking Active             Assessment/Plan:   Medication Management: - Reviewed medication list with patient and updated as appropriate.  - Discussed additive CNS depressive effects of her medications for sleep including amitriptyline, trazodone, clonazepam, and gapapentin. Patient to discuss switching from amitriptyline to SSRI or SNRI with PCP to manage chronic  anxiety vs continuing amitriptyline for sleep. Instructed pt not to stop taking amitriptyline until she has a plan to taper off or switch to another medication. - Encouraged pt to reach out to PCP with inquiries about future medication changes.   Follow Up Plan: PCP as needed  Nils Pyle, PharmD PGY1 Pharmacy Resident

## 2022-11-30 ENCOUNTER — Ambulatory Visit: Payer: Medicaid Other | Attending: Family Medicine | Admitting: Occupational Therapy

## 2022-11-30 ENCOUNTER — Other Ambulatory Visit: Payer: Self-pay

## 2022-11-30 DIAGNOSIS — R278 Other lack of coordination: Secondary | ICD-10-CM | POA: Diagnosis present

## 2022-11-30 DIAGNOSIS — M79641 Pain in right hand: Secondary | ICD-10-CM | POA: Diagnosis present

## 2022-11-30 DIAGNOSIS — M6281 Muscle weakness (generalized): Secondary | ICD-10-CM | POA: Diagnosis not present

## 2022-11-30 DIAGNOSIS — M79642 Pain in left hand: Secondary | ICD-10-CM | POA: Diagnosis present

## 2022-11-30 DIAGNOSIS — R208 Other disturbances of skin sensation: Secondary | ICD-10-CM | POA: Diagnosis present

## 2022-11-30 DIAGNOSIS — G5601 Carpal tunnel syndrome, right upper limb: Secondary | ICD-10-CM | POA: Diagnosis not present

## 2022-11-30 DIAGNOSIS — M797 Fibromyalgia: Secondary | ICD-10-CM | POA: Insufficient documentation

## 2022-11-30 NOTE — Therapy (Signed)
OUTPATIENT OCCUPATIONAL THERAPY ORTHO EVALUATION  Patient Name: Lindsey [name] MRN: [id number] DOB:1965-07-19, 57 y.o., female Today's Date: 11/30/2022  PCP: [place], Betty G, MD REFERRING PROVIDER: [place], Betty G, MD  END OF SESSION:  OT End of Session - 11/30/22 1456     Visit Number 1    Number of Visits 9    Date for OT Re-Evaluation 01/27/23    Authorization Type [place] Medicaid [employer]    OT Start Time 1450    OT Stop Time 1542    OT Time Calculation (min) 52 min             Past Medical History:  Diagnosis Date   Allergy    Angio-edema    Arthritis    Asthma    Cancer (HCC) 10/25/2021   Carpal tunnel syndrome 07/07/2015   right   Chronic cough 12/02/2015   Chronic pain syndrome 08/10/2016   CVID (common variable immunodeficiency) (HCC)    Ehlers-Danlos syndrome    Essential alopecia of women 04/14/2011   Family history of breast cancer in female 08/26/2015   (x2) paternal 1st cousins dx in their 55s    Fibrocystic breast changes of both breasts 07/19/2015   Fibromyalgia    HLD (hyperlipidemia)    Hypermobility syndrome 11/26/2014   Based on Fam Hx and Beighton Score of 7 this is likely ED III    Hypertrophy of vulva 07/20/2017   Inflammatory dermatosis 04/14/2011   Irritable larynx syndrome 12/02/2015   Lumbar radiculopathy 02/16/2016   MGUS (monoclonal gammopathy of unknown significance)    Osteoarthritis 04/01/2017   Osteopenia    Osteoporosis 12/05/2017   Primary arthrosis of first carpometacarpal joints, bilateral 06/01/2015   Sacroiliac joint dysfunction of right side 11/26/2014   B SI joint xrays last done at [facility] Dr. Deanne Coffer 03/13/2017   Skin cancer    Past Surgical History:  Procedure Laterality Date   BREAST EXCISIONAL BIOPSY Left 01/2019   atypical lobular hyperplasia   BREAST SURGERY  02/05/2019   Atypical Lobular Hyperplasia   CARPAL TUNNEL RELEASE Right 06/2020   COLONOSCOPY     DENTAL SURGERY     ELBOW FRACTURE  SURGERY Left    as child   EYE SURGERY  2005   LASIK   FRACTURE SURGERY  1974   L elbow break   JOINT REPLACEMENT  07/07/2020   first metacarpal joint R hand   RADIOACTIVE SEED GUIDED EXCISIONAL BREAST BIOPSY Left 02/05/2019   Procedure: RADIOACTIVE SEED GUIDED EXCISIONAL LEFT BREAST BIOPSY;  Surgeon: Emelia Loron, MD;  Location: [facility];  Service: General;  Laterality: Left;   SPINE SURGERY  2019   RFA, Barrie Dunker; [facility]   Patient Active Problem List   Diagnosis Date Noted   Postural dizziness with presyncope 11/05/2022   Auditory processing disorder 09/22/2022   Addison's disease (HCC) 07/01/2022   Common variable immunodeficiency, unspecified (HCC) 12/02/2021   Postural orthostatic tachycardia syndrome (POTS) 11/25/2021   Vasovagal syncope 11/25/2021   MGUS (monoclonal gammopathy of unknown significance) 10/25/2021   Monoclonal gammopathy 10/25/2021   Acquired primary hypogammaglobulinemia (HCC) 09/10/2021   Carpal tunnel syndrome of right wrist 03/11/2021   Gluten intolerance 04/11/2020   Non-restorative sleep 01/14/2020   Menopausal and female climacteric states 01/14/2020   Insomnia due to medical condition 01/14/2020   Chronic right-sided low back pain 02/22/2018   Hypertrophy of vulva 07/20/2017   Deviated nasal septum 04/11/2017   Osteoarthritis 04/01/2017   Fibromyalgia 08/10/2016  Chronic pain syndrome 08/10/2016   Lumbar radiculopathy 02/16/2016   Hearing impairment 12/04/2015   Chronic cough 12/02/2015   Irritable larynx syndrome 12/02/2015   Genetic testing 09/21/2015   Family history of breast cancer in female 08/26/2015   Fibrocystic breast changes of both breasts 07/19/2015   Carpal tunnel syndrome 07/07/2015   Primary arthrosis of first carpometacarpal joints, bilateral 06/01/2015   Ehlers-Danlos syndrome 05/11/2015   Sacroiliac joint dysfunction of right side 11/26/2014   Hypermobility syndrome 11/26/2014    Essential alopecia of women 04/14/2011   Inflammatory dermatosis 04/14/2011    ONSET DATE: march 2022  REFERRING DIAG: G56.01 (ICD-10-CM) - Carpal tunnel syndrome of right wrist M79.7 (ICD-10-CM) - Fibromyalgia  THERAPY DIAG:  Muscle weakness (generalized)  Other lack of coordination  Other disturbances of skin sensation  Pain in left hand  Pain in right hand  Rationale for Evaluation and Treatment: Rehabilitation  SUBJECTIVE:   SUBJECTIVE STATEMENT: Pt reports having a meniscus tear and having a fall recently, does report that she was supposed to have a PT referral sent in to OP at [facility]. Pt states "With Lorinda Creed - hypermobility type, I have to be very careful and gentle due to decreased healing."  Pt reports having carpal tunnel release in R arm in March 2022 which did not heal appropriately therefore surgeon not wanting to complete surgery on L arm.  Pt reports no grip strength in BUE, notes R > L due to dominant UE and not healing appropriately s/p surgery.  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   Pt accompanied by: self  PERTINENT HISTORY: She has a complex medical history and multiple ongoing health concerns. She reports a history of autoimmune disease, generalized pain (myalgias and arthralgias), asthma, primary immunodeficiency, and smoldering multiple myeloma ("cancer") She has undergone surgery for carpal tunnel syndrome, resulting in significant functional limitations in her right hand, including an inability to open water bottles or pick up objects. States that recovery from this surgery is expected to take up to two years, but she returned to work after three months with accommodations that were not fully implemented.  PRECAUTIONS: Fall  RED FLAGS: None   WEIGHT BEARING RESTRICTIONS: No  PAIN:  Are you having pain? Yes: NPRS  scale: 7/10 Pain location: "all over body pain" reports back, knees, hips hurt the worst Pain description: dull, occasional sharp pains Aggravating factors: certain movements Relieving factors: ice  FALLS: Has patient fallen in last 6 months? Yes. Number of falls multiple, reports falling multiple times a day has had a fall at least once a month where she is injuring herself  LIVING ENVIRONMENT: Lives with: lives alone Lives in: [place] Stairs: Yes: External: flight of steps; bilateral but cannot reach both Has following equipment at home: None  PLOF: Independent, Independent with basic ADLs, and requesting a PCA due to decreased ability to complete IADLs  PATIENT GOALS: assist to adapt and be ahead of the game instead of playing catchup.  OBJECTIVE:   HAND DOMINANCE: Right  ADLs: Overall ADLs: Pt reports difficulty with clothing fasteners, typically wearing "pull on" type clothing and slip on shoes.  Per chart review pt requesting a PCS as she states that she needs assistance with preparing meals, "home work",and showering because she does "not feel" like doing it. Transfers/ambulation related to ADLs: Independent, but does report frequent falls due  to "blacking out" Equipment: none  FUNCTIONAL OUTCOME MEASURES: Physical performance test: Handwriting: 11:34 sec*  UPPER EXTREMITY ROM:     Active ROM Right eval Left eval  Shoulder flexion    Shoulder abduction    Shoulder adduction    Shoulder extension    Shoulder internal rotation    Shoulder external rotation    Elbow flexion    Elbow extension    Wrist flexion 78 77  Wrist extension 67 73  Wrist ulnar deviation 33 42  Wrist radial deviation 15 25  Wrist pronation    Wrist supination    (Blank rows = not tested)  Active ROM Right eval Left eval  Thumb MCP (0-60) 43   Thumb IP (0-80) 75   Thumb Radial abd/add (0-55)    Thumb Palmar abd/add (0-45)     Thumb Opposition to Small Finger     Index MCP (0-90)      Index PIP (0-100)     Index DIP (0-70)      Long MCP (0-90)      Long PIP (0-100)      Long DIP (0-70)      Ring MCP (0-90)      Ring PIP (0-100)      Ring DIP (0-70)      Little MCP (0-90)      Little PIP (0-100)      Little DIP (0-70)      (Blank rows = not tested)  HAND FUNCTION: Grip strength: Right: 67 lbs; Left: 58 lbs  Right hand thenar and hypothenar muscle atrophy.   COORDINATION: TBD at next session  SENSATION: Impaired sensation in distal finger tips.  Will need for formally assess at next session  COGNITION: Overall cognitive status: Within functional limits for tasks assessed.  Pt with periods of verbosity and tangential in nature.  OBSERVATIONS: Pt quite verbose and tangential during evaluation requiring increased time and therapeutic use of self as pt with multiple medical conditions and concerns.  Pt reports weakness in BUE, however demonstrating good ROM and strength.   TODAY'S TREATMENT:                                                                                                                              DATE:    NA, eval only   PATIENT EDUCATION: Education details: Educated on role and purpose of OT as well as potential interventions and goals for therapy based on initial evaluation findings. Person educated: Patient Education method: Explanation Education comprehension: verbalized understanding and needs further education  HOME EXERCISE PROGRAM: TBD  GOALS: Goals reviewed with patient? No  SHORT TERM GOALS: Target date: 12/30/22  Pt will be independent with HEP for ROM and strengthening. Baseline: no current HEP Goal status: INITIAL  2.  Pt will demonstrate improved UE functional use for ADLs as evidenced by increasing box/ blocks score by >4 blocks with BUE Baseline: TBD Goal status: INITIAL  3.  Pt will demonstrate improved fine motor coordination for ADLs as evidenced by decreasing 9 hole peg test score for BUE by 3  secs Baseline: TBD Goal status: INITIAL  4.  Pt will demonstrate increased thumb ROM as needed to open containers and sustain grasp as needed for meal prep tasks. Baseline: reduced thumb ROM (see measurements above) Goal status: INITIAL   LONG TERM GOALS: Target date: 01/27/23  Pt will report understanding of recommended adaptive techniques, AE, and/or DME to increase safety and reduce falls in the home (particularly in the bathroom). Baseline: reports frequent falls Goal status: INITIAL  2.  Pt will demonstrate improved grip strength > 5# bilaterally to increase strength as needed to complete homemaking tasks such as cooking and/or changing sheets.   Baseline: Grip strength: Right: 67 lbs; Left: 58 lbs Goal status: INITIAL  3.  Pt will write a short paragraph with 100% legibility and no significant fatigue. Baseline: reports decreased ease and endurance with handwriting Goal status: INITIAL  4.  Pt will improve functional ability by decreased impairment per Quick DASH assessment from by 30% or better, for better quality of life.  Baseline: TBD Goal status: INITIAL   ASSESSMENT:  CLINICAL IMPRESSION: Patient is a 57 y.o. female who was seen today for occupational therapy evaluation for decreased functional use of dominant and nondominant UE s/p carpal tunnel and thumb CMC arthroplasty of RUE. Pt reports due to [employer] syndrome and decreased accommodations at work, pt has had compromised recovery post surgery.  Pt with decreased strength and functional use of BUE R> L.  Pt lives alone in a condo on the 2nd floor and is on disability. Pt will benefit from skilled occupational therapy services to address strength and coordination, ROM, pain management, altered sensation, GM/FM control, safety awareness, introduction of compensatory strategies/AE prn, and implementation of an HEP to improve participation and safety during ADLs and IADLs.   PERFORMANCE DEFICITS: in functional skills  including ADLs, IADLs, coordination, dexterity, sensation, ROM, strength, pain, flexibility, Fine motor control, Gross motor control, balance, body mechanics, endurance, decreased knowledge of use of DME, and UE functional use and psychosocial skills including environmental adaptation and routines and behaviors.   IMPAIRMENTS: are limiting patient from ADLs and IADLs.   COMORBIDITIES: may have co-morbidities  that affects occupational performance. Patient will benefit from skilled OT to address above impairments and improve overall function.  MODIFICATION OR ASSISTANCE TO COMPLETE EVALUATION: No modification of tasks or assist necessary to complete an evaluation.  OT OCCUPATIONAL PROFILE AND HISTORY: Detailed assessment: Review of records and additional review of physical, cognitive, psychosocial history related to current functional performance.  CLINICAL DECISION MAKING: LOW - limited treatment options, no task modification necessary  REHAB POTENTIAL: Fair trialed therapy before with minimal improvements  EVALUATION COMPLEXITY: Low      PLAN:  OT FREQUENCY: 1x/week  OT DURATION: 8 weeks  PLANNED INTERVENTIONS: self care/ADL training, therapeutic exercise, therapeutic activity, neuromuscular re-education, manual therapy, scar mobilization, passive range of motion, splinting, ultrasound, paraffin, fluidotherapy, compression bandaging, moist heat, cryotherapy, patient/family education, psychosocial skills training, energy conservation, coping strategies training, and DME and/or AE instructions  RECOMMENDED OTHER SERVICES: PT  CONSULTED AND AGREED WITH PLAN OF CARE: Patient  PLAN FOR NEXT SESSION: assess coordination with 9 hole peg test and box and blocks, complete UEFS or QuickDash   Check all possible CPT codes: 95621 - OT Re-evaluation, 97110- Therapeutic Exercise, [id number]- Neuro Re-education, 97140 - Manual Therapy, 97530 - Therapeutic Activities, 97535 - Self Care,  21308 -  Ultrasound, 65784 - Fluidotherapy, [id number] - Contrast bath, and 69629 -  Paraffin    Check all conditions that are expected to impact treatment: {Conditions expected to impact treatment:Associated genetic disorder   If treatment provided at initial evaluation, no treatment charged due to lack of authorization.       Rosalio Loud, OT 11/30/2022, 4:40 PM

## 2022-12-01 ENCOUNTER — Encounter: Payer: Self-pay | Admitting: Family Medicine

## 2022-12-01 ENCOUNTER — Other Ambulatory Visit: Payer: Self-pay | Admitting: Family Medicine

## 2022-12-01 ENCOUNTER — Other Ambulatory Visit (HOSPITAL_BASED_OUTPATIENT_CLINIC_OR_DEPARTMENT_OTHER): Payer: Self-pay

## 2022-12-02 ENCOUNTER — Other Ambulatory Visit (HOSPITAL_BASED_OUTPATIENT_CLINIC_OR_DEPARTMENT_OTHER): Payer: Self-pay

## 2022-12-02 ENCOUNTER — Other Ambulatory Visit: Payer: Self-pay

## 2022-12-02 MED ORDER — CLONAZEPAM 0.5 MG PO TABS
0.5000 mg | ORAL_TABLET | Freq: Every day | ORAL | 3 refills | Status: DC
  Filled 2022-12-02: qty 30, 30d supply, fill #0
  Filled 2022-12-30: qty 30, 30d supply, fill #1
  Filled 2023-01-30: qty 30, 30d supply, fill #2
  Filled 2023-01-31: qty 30, 30d supply, fill #0
  Filled 2023-02-27 – 2023-03-31 (×3): qty 30, 30d supply, fill #1

## 2022-12-02 NOTE — Telephone Encounter (Signed)
Last filled 7/12

## 2022-12-06 ENCOUNTER — Ambulatory Visit: Payer: Medicaid Other | Admitting: Occupational Therapy

## 2022-12-06 DIAGNOSIS — R208 Other disturbances of skin sensation: Secondary | ICD-10-CM

## 2022-12-06 DIAGNOSIS — M79641 Pain in right hand: Secondary | ICD-10-CM

## 2022-12-06 DIAGNOSIS — M79642 Pain in left hand: Secondary | ICD-10-CM

## 2022-12-06 DIAGNOSIS — R278 Other lack of coordination: Secondary | ICD-10-CM

## 2022-12-06 DIAGNOSIS — M6281 Muscle weakness (generalized): Secondary | ICD-10-CM | POA: Diagnosis not present

## 2022-12-06 NOTE — Therapy (Signed)
OUTPATIENT OCCUPATIONAL THERAPY ORTHO  Treatment Note  Patient Name: Lindsey [name] MRN: [id number] DOB:08-Jul-1965, 57 y.o., female Today's Date: 12/06/2022  PCP: [place], Betty G, MD REFERRING PROVIDER: [place], Betty G, MD  END OF SESSION:  OT End of Session - 12/06/22 0935     Visit Number 2    Number of Visits 7    Date for OT Re-Evaluation 01/27/23    Authorization Type [place] Medicaid [employer]    Authorization Time Period approved 6 visits from 12/01/2022 - 01/29/2023    Authorization - Visit Number 1    Authorization - Number of Visits 6    OT Start Time 0935    OT Stop Time 1015    OT Time Calculation (min) 40 min              Past Medical History:  Diagnosis Date   Allergy    Angio-edema    Arthritis    Asthma    Cancer (HCC) 10/25/2021   Carpal tunnel syndrome 07/07/2015   right   Chronic cough 12/02/2015   Chronic pain syndrome 08/10/2016   CVID (common variable immunodeficiency) (HCC)    Ehlers-Danlos syndrome    Essential alopecia of women 04/14/2011   Family history of breast cancer in female 08/26/2015   (x2) paternal 1st cousins dx in their 70s    Fibrocystic breast changes of both breasts 07/19/2015   Fibromyalgia    HLD (hyperlipidemia)    Hypermobility syndrome 11/26/2014   Based on Fam Hx and Beighton Score of 7 this is likely ED III    Hypertrophy of vulva 07/20/2017   Inflammatory dermatosis 04/14/2011   Irritable larynx syndrome 12/02/2015   Lumbar radiculopathy 02/16/2016   MGUS (monoclonal gammopathy of unknown significance)    Osteoarthritis 04/01/2017   Osteopenia    Osteoporosis 12/05/2017   Primary arthrosis of first carpometacarpal joints, bilateral 06/01/2015   Sacroiliac joint dysfunction of right side 11/26/2014   B SI joint xrays last done at [facility] Dr. Deanne Coffer 03/13/2017   Skin cancer    Past Surgical History:  Procedure Laterality Date   BREAST EXCISIONAL BIOPSY Left 01/2019   atypical lobular hyperplasia    BREAST SURGERY  02/05/2019   Atypical Lobular Hyperplasia   CARPAL TUNNEL RELEASE Right 06/2020   COLONOSCOPY     DENTAL SURGERY     ELBOW FRACTURE SURGERY Left    as child   EYE SURGERY  2005   LASIK   FRACTURE SURGERY  1974   L elbow break   JOINT REPLACEMENT  07/07/2020   first metacarpal joint R hand   RADIOACTIVE SEED GUIDED EXCISIONAL BREAST BIOPSY Left 02/05/2019   Procedure: RADIOACTIVE SEED GUIDED EXCISIONAL LEFT BREAST BIOPSY;  Surgeon: Emelia Loron, MD;  Location:  SURGERY CENTER;  Service: General;  Laterality: Left;   SPINE SURGERY  2019   RFA, Barrie Dunker; [facility]   Patient Active Problem List   Diagnosis Date Noted   Postural dizziness with presyncope 11/05/2022   Auditory processing disorder 09/22/2022   Addison's disease (HCC) 07/01/2022   Common variable immunodeficiency, unspecified (HCC) 12/02/2021   Postural orthostatic tachycardia syndrome (POTS) 11/25/2021   Vasovagal syncope 11/25/2021   MGUS (monoclonal gammopathy of unknown significance) 10/25/2021   Monoclonal gammopathy 10/25/2021   Acquired primary hypogammaglobulinemia (HCC) 09/10/2021   Carpal tunnel syndrome of right wrist 03/11/2021   Gluten intolerance 04/11/2020   Non-restorative sleep 01/14/2020   Menopausal and female climacteric states 01/14/2020   Insomnia  due to medical condition 01/14/2020   Chronic right-sided low back pain 02/22/2018   Hypertrophy of vulva 07/20/2017   Deviated nasal septum 04/11/2017   Osteoarthritis 04/01/2017   Fibromyalgia 08/10/2016   Chronic pain syndrome 08/10/2016   Lumbar radiculopathy 02/16/2016   Hearing impairment 12/04/2015   Chronic cough 12/02/2015   Irritable larynx syndrome 12/02/2015   Genetic testing 09/21/2015   Family history of breast cancer in female 08/26/2015   Fibrocystic breast changes of both breasts 07/19/2015   Carpal tunnel syndrome 07/07/2015   Primary arthrosis of first carpometacarpal joints,  bilateral 06/01/2015   Ehlers-Danlos syndrome 05/11/2015   Sacroiliac joint dysfunction of right side 11/26/2014   Hypermobility syndrome 11/26/2014   Essential alopecia of women 04/14/2011   Inflammatory dermatosis 04/14/2011    ONSET DATE: march 2022  REFERRING DIAG: G56.01 (ICD-10-CM) - Carpal tunnel syndrome of right wrist M79.7 (ICD-10-CM) - Fibromyalgia  THERAPY DIAG:  Muscle weakness (generalized)  Other lack of coordination  Other disturbances of skin sensation  Pain in left hand  Pain in right hand  Rationale for Evaluation and Treatment: Rehabilitation  SUBJECTIVE:   SUBJECTIVE STATEMENT: Pt reports having to reschedule 2 appts due to conflicts with other appointments.   Pt accompanied by: self  PERTINENT HISTORY: She has a complex medical history and multiple ongoing health concerns. She reports a history of autoimmune disease, generalized pain (myalgias and arthralgias), asthma, primary immunodeficiency, and smoldering multiple myeloma ("cancer") She has undergone surgery for carpal tunnel syndrome, resulting in significant functional limitations in her right hand, including an inability to open water bottles or pick up objects. States that recovery from this surgery is expected to take up to two years, but she returned to work after three months with accommodations that were not fully implemented.  PRECAUTIONS: Fall  RED FLAGS: None   WEIGHT BEARING RESTRICTIONS: No  PAIN:  Are you having pain? Yes: NPRS scale: 7/10 Pain location: "all over body pain" reports back, knees, hips hurt the worst Pain description: dull, occasional sharp pains Aggravating factors: certain movements Relieving factors: ice  FALLS: Has patient fallen in last 6 months? Yes. Number of falls multiple, reports falling multiple times a day has had a fall at least once a month where she is injuring herself  LIVING ENVIRONMENT: Lives with: lives alone Lives in: [place] Stairs: Yes:  External: flight of steps; bilateral but cannot reach both Has following equipment at home: None  PLOF: Independent, Independent with basic ADLs, and requesting a PCA due to decreased ability to complete IADLs  PATIENT GOALS: assist to adapt and be ahead of the game instead of playing catchup.  OBJECTIVE:   HAND DOMINANCE: Right  ADLs: Overall ADLs: Pt reports difficulty with clothing fasteners, typically wearing "pull on" type clothing and slip on shoes.  Per chart review pt requesting a PCS as she states that she needs assistance with preparing meals, "home work",and showering because she does "not feel" like doing it. Transfers/ambulation related to ADLs: Independent, but does report frequent falls due to "blacking out" Equipment: none  FUNCTIONAL OUTCOME MEASURES: Physical performance test: Handwriting: 11:34 sec*  UPPER EXTREMITY ROM:     Active ROM Right eval Left eval  Shoulder flexion    Shoulder abduction    Shoulder adduction    Shoulder extension    Shoulder internal rotation    Shoulder external rotation    Elbow flexion    Elbow extension    Wrist flexion 78 77  Wrist extension 67 73  Wrist ulnar deviation 33 42  Wrist radial deviation 15 25  Wrist pronation    Wrist supination    (Blank rows = not tested)  Active ROM Right eval Left eval  Thumb MCP (0-60) 43   Thumb IP (0-80) 75   Thumb Radial abd/add (0-55)    Thumb Palmar abd/add (0-45)     Thumb Opposition to Small Finger     Index MCP (0-90)     Index PIP (0-100)     Index DIP (0-70)      Long MCP (0-90)      Long PIP (0-100)      Long DIP (0-70)      Ring MCP (0-90)      Ring PIP (0-100)      Ring DIP (0-70)      Little MCP (0-90)      Little PIP (0-100)      Little DIP (0-70)      (Blank rows = not tested)  HAND FUNCTION: Grip strength: Right: 67 lbs; Left: 58 lbs  Right hand thenar and hypothenar muscle atrophy.   COORDINATION: 12/06/22:  9 hole peg test: Right: 30.66 sec and  Left: 25.09 sec Box and Blocks: Right: 52 blocks and Left: 56 blocks  SENSATION: Impaired sensation in distal finger tips.  Will need for formally assess at next session  COGNITION: Overall cognitive status: Within functional limits for tasks assessed.  Pt with periods of verbosity and tangential in nature.  OBSERVATIONS: Pt quite verbose and tangential during evaluation requiring increased time and therapeutic use of self as pt with multiple medical conditions and concerns.  Pt reports weakness in BUE, however demonstrating good ROM and strength.   TODAY'S TREATMENT:                                                                                                                              DATE:    12/06/22 Box and Blocks and 9 hole peg test: (see above for measurements) noted decreased wrist extension bilaterally with box and blocks.   UE ROM with focus on posture and stability progressing to gentle ROM  Engaged in scapular retraction, shoulder rolls while providing education on importance of body posture and stability around areas of concern.  Educated on use of rolled up towel as needed for support along spine.  Engaged in shoulder flexion, horizontal abduction/adduction for 2-3 mins bilaterally with maintaining ball in hand for improved motor control and gentle gliding.  Transitioned to finger flexion/extension around ball in hand with focus on quality of movement and stability while not overexerting strength and/or pressure.      PATIENT EDUCATION: Education details: ongoing condition specific education Person educated: Patient Education method: Explanation Education comprehension: verbalized understanding and needs further education  HOME EXERCISE PROGRAM: Access Code: [email or web address] URL: [email or web address] Date: 12/06/2022 Prepared by: [facility] - Outpatient  Re[facility] - [facility]  Program Notes Hypermobility Stability Class: 6 Exercises with a  Tennis Ball in 10  minutes! ([email or web address])  Exercises - Seated Scapular Retraction  - 2 x daily - 2 sets - 10 reps - Seated Shoulder Rolls  - 2 x daily - 2 sets - 10 reps - Supine Bilateral Punches  - 2 x daily - 2 sets - 10 reps  GOALS: Goals reviewed with patient? No  SHORT TERM GOALS: Target date: 12/30/22  Pt will be independent with HEP for ROM and strengthening. Baseline: no current HEP Goal status: IN PROGRESS  2.  Pt will demonstrate improved UE functional use for ADLs as evidenced by increasing box/ blocks score by >4 blocks with BUE Baseline: Box and Blocks: Right: 52 blocks and Left: 56 blocks Goal status: IN PROGRESS  3.  Pt will demonstrate improved fine motor coordination for ADLs as evidenced by decreasing 9 hole peg test score for BUE by 3 secs Baseline: 9 hole peg test: Right: 30.66 sec and Left: 25.09 sec Goal status: IN PROGRESS  4.  Pt will demonstrate increased thumb ROM as needed to open containers and sustain grasp as needed for meal prep tasks. Baseline: reduced thumb ROM (see measurements above) Goal status:  IN PROGRESS   LONG TERM GOALS: Target date: 01/27/23  Pt will report understanding of recommended adaptive techniques, AE, and/or DME to increase safety and reduce falls in the home (particularly in the bathroom). Baseline: reports frequent falls Goal status:  IN PROGRESS  2.  Pt will demonstrate improved grip strength > 5# bilaterally to increase strength as needed to complete homemaking tasks such as cooking and/or changing sheets.   Baseline: Grip strength: Right: 67 lbs; Left: 58 lbs Goal status:  IN PROGRESS  3.  Pt will write a short paragraph with 100% legibility and no significant fatigue. Baseline: reports decreased ease and endurance with handwriting Goal status:  IN PROGRESS  4.  Pt will improve functional ability by decreased impairment per Quick DASH assessment from by 30% or better, for better quality of life.  Baseline: TBD Goal status:  IN  PROGRESS   ASSESSMENT:  CLINICAL IMPRESSION: Pt verbalizing good understanding of strengthening and alternative therapeutic programs to aid in postural control and management of EDS symptoms. Pt demonstrating understanding of body alignment during UB stability exercises.  PERFORMANCE DEFICITS: in functional skills including ADLs, IADLs, coordination, dexterity, sensation, ROM, strength, pain, flexibility, Fine motor control, Gross motor control, balance, body mechanics, endurance, decreased knowledge of use of DME, and UE functional use and psychosocial skills including environmental adaptation and routines and behaviors.    PLAN:  OT FREQUENCY: 1x/week  OT DURATION: 8 weeks  PLANNED INTERVENTIONS: self care/ADL training, therapeutic exercise, therapeutic activity, neuromuscular re-education, manual therapy, scar mobilization, passive range of motion, splinting, ultrasound, paraffin, fluidotherapy, compression bandaging, moist heat, cryotherapy, patient/family education, psychosocial skills training, energy conservation, coping strategies training, and DME and/or AE instructions  RECOMMENDED OTHER SERVICES: PT  CONSULTED AND AGREED WITH PLAN OF CARE: Patient  PLAN FOR NEXT SESSION: complete UEFS or QuickDash, Initiate wrist and proximal support strengthening       Sanskriti Greenlaw, OTR/L 12/06/2022, 4:15 PM

## 2022-12-09 ENCOUNTER — Encounter: Payer: Self-pay | Admitting: Family Medicine

## 2022-12-09 ENCOUNTER — Other Ambulatory Visit: Payer: Self-pay

## 2022-12-09 ENCOUNTER — Encounter: Payer: Self-pay | Admitting: Allergy

## 2022-12-09 DIAGNOSIS — G894 Chronic pain syndrome: Secondary | ICD-10-CM

## 2022-12-09 DIAGNOSIS — M797 Fibromyalgia: Secondary | ICD-10-CM

## 2022-12-09 MED ORDER — EPIPEN 2-PAK 0.3 MG/0.3ML IJ SOAJ
0.3000 mg | INTRAMUSCULAR | 1 refills | Status: DC | PRN
  Filled 2022-12-09: qty 2, 7d supply, fill #0

## 2022-12-10 ENCOUNTER — Other Ambulatory Visit (HOSPITAL_BASED_OUTPATIENT_CLINIC_OR_DEPARTMENT_OTHER): Payer: Self-pay

## 2022-12-10 ENCOUNTER — Other Ambulatory Visit (HOSPITAL_COMMUNITY): Payer: Self-pay

## 2022-12-13 ENCOUNTER — Other Ambulatory Visit: Payer: Self-pay

## 2022-12-13 DIAGNOSIS — K5902 Outlet dysfunction constipation: Secondary | ICD-10-CM | POA: Insufficient documentation

## 2022-12-14 ENCOUNTER — Other Ambulatory Visit: Payer: Self-pay

## 2022-12-15 ENCOUNTER — Ambulatory Visit: Payer: Medicaid Other | Attending: Family Medicine | Admitting: Occupational Therapy

## 2022-12-15 DIAGNOSIS — R208 Other disturbances of skin sensation: Secondary | ICD-10-CM | POA: Diagnosis present

## 2022-12-15 DIAGNOSIS — M79641 Pain in right hand: Secondary | ICD-10-CM | POA: Diagnosis present

## 2022-12-15 DIAGNOSIS — M79642 Pain in left hand: Secondary | ICD-10-CM | POA: Diagnosis present

## 2022-12-15 DIAGNOSIS — M6281 Muscle weakness (generalized): Secondary | ICD-10-CM | POA: Diagnosis present

## 2022-12-15 DIAGNOSIS — R278 Other lack of coordination: Secondary | ICD-10-CM | POA: Insufficient documentation

## 2022-12-15 NOTE — Therapy (Signed)
OUTPATIENT OCCUPATIONAL THERAPY ORTHO  Treatment Note  Patient Name: Lindsey [name] MRN: [id number] DOB:November 27, 1965, 57 y.o., female Today's Date: 12/15/2022  PCP: [place], Betty G, MD REFERRING PROVIDER: [place], Betty G, MD  END OF SESSION:  OT End of Session - 12/15/22 0859     Visit Number 3    Number of Visits 7    Date for OT Re-Evaluation 01/27/23    Authorization Type [place] Medicaid [place]    Authorization Time Period approved 6 visits from 12/01/2022 - 01/29/2023    Authorization - Visit Number 2    Authorization - Number of Visits 6    OT Start Time 0846    OT Stop Time 0930    OT Time Calculation (min) 44 min    Activity Tolerance Patient tolerated treatment well               Past Medical History:  Diagnosis Date   Allergy    Angio-edema    Arthritis    Asthma    Cancer (HCC) 10/25/2021   Carpal tunnel syndrome 07/07/2015   right   Chronic cough 12/02/2015   Chronic pain syndrome 08/10/2016   CVID (common variable immunodeficiency) (HCC)    Ehlers-Danlos syndrome    Essential alopecia of women 04/14/2011   Family history of breast cancer in female 08/26/2015   (x2) paternal 1st cousins dx in their 71s    Fibrocystic breast changes of both breasts 07/19/2015   Fibromyalgia    HLD (hyperlipidemia)    Hypermobility syndrome 11/26/2014   Based on Fam Hx and Beighton Score of 7 this is likely ED III    Hypertrophy of vulva 07/20/2017   Inflammatory dermatosis 04/14/2011   Irritable larynx syndrome 12/02/2015   Lumbar radiculopathy 02/16/2016   MGUS (monoclonal gammopathy of unknown significance)    Osteoarthritis 04/01/2017   Osteopenia    Osteoporosis 12/05/2017   Primary arthrosis of first carpometacarpal joints, bilateral 06/01/2015   Sacroiliac joint dysfunction of right side 11/26/2014   B SI joint xrays last done at [facility] Dr. Deanne Coffer 03/13/2017   Skin cancer    Past Surgical History:  Procedure Laterality Date   BREAST  EXCISIONAL BIOPSY Left 01/2019   atypical lobular hyperplasia   BREAST SURGERY  02/05/2019   Atypical Lobular Hyperplasia   CARPAL TUNNEL RELEASE Right 06/2020   COLONOSCOPY     DENTAL SURGERY     ELBOW FRACTURE SURGERY Left    as child   EYE SURGERY  2005   LASIK   FRACTURE SURGERY  1974   L elbow break   JOINT REPLACEMENT  07/07/2020   first metacarpal joint R hand   RADIOACTIVE SEED GUIDED EXCISIONAL BREAST BIOPSY Left 02/05/2019   Procedure: RADIOACTIVE SEED GUIDED EXCISIONAL LEFT BREAST BIOPSY;  Surgeon: Emelia Loron, MD;  Location: [facility];  Service: General;  Laterality: Left;   SPINE SURGERY  2019   RFA, Barrie Dunker; [facility]   Patient Active Problem List   Diagnosis Date Noted   Postural dizziness with presyncope 11/05/2022   Auditory processing disorder 09/22/2022   Addison's disease (HCC) 07/01/2022   Common variable immunodeficiency, unspecified (HCC) 12/02/2021   Postural orthostatic tachycardia syndrome (POTS) 11/25/2021   Vasovagal syncope 11/25/2021   MGUS (monoclonal gammopathy of unknown significance) 10/25/2021   Monoclonal gammopathy 10/25/2021   Acquired primary hypogammaglobulinemia (HCC) 09/10/2021   Carpal tunnel syndrome of right wrist 03/11/2021   Gluten intolerance 04/11/2020   Non-restorative sleep 01/14/2020  Menopausal and female climacteric states 01/14/2020   Insomnia due to medical condition 01/14/2020   Chronic right-sided low back pain 02/22/2018   Hypertrophy of vulva 07/20/2017   Deviated nasal septum 04/11/2017   Osteoarthritis 04/01/2017   Fibromyalgia 08/10/2016   Chronic pain syndrome 08/10/2016   Lumbar radiculopathy 02/16/2016   Hearing impairment 12/04/2015   Chronic cough 12/02/2015   Irritable larynx syndrome 12/02/2015   Genetic testing 09/21/2015   Family history of breast cancer in female 08/26/2015   Fibrocystic breast changes of both breasts 07/19/2015   Carpal tunnel syndrome  07/07/2015   Primary arthrosis of first carpometacarpal joints, bilateral 06/01/2015   Ehlers-Danlos syndrome 05/11/2015   Sacroiliac joint dysfunction of right side 11/26/2014   Hypermobility syndrome 11/26/2014   Essential alopecia of women 04/14/2011   Inflammatory dermatosis 04/14/2011    ONSET DATE: march 2022  REFERRING DIAG: G56.01 (ICD-10-CM) - Carpal tunnel syndrome of right wrist M79.7 (ICD-10-CM) - Fibromyalgia  THERAPY DIAG:  Muscle weakness (generalized)  Other lack of coordination  Other disturbances of skin sensation  Pain in left hand  Pain in right hand  Rationale for Evaluation and Treatment: Rehabilitation  SUBJECTIVE:   SUBJECTIVE STATEMENT: Pt reports having a steroid injection in her back yesterday.  Pt reports attempting to eat crab legs and reports difficulty with wrist flexion and ulnar/radial deviation.   Pt accompanied by: self  PERTINENT HISTORY: She has a complex medical history and multiple ongoing health concerns. She reports a history of autoimmune disease, generalized pain (myalgias and arthralgias), asthma, primary immunodeficiency, and smoldering multiple myeloma ("cancer") She has undergone surgery for carpal tunnel syndrome, resulting in significant functional limitations in her right hand, including an inability to open water bottles or pick up objects. States that recovery from this surgery is expected to take up to two years, but she returned to work after three months with accommodations that were not fully implemented.  PRECAUTIONS: Fall  RED FLAGS: None   WEIGHT BEARING RESTRICTIONS: No  PAIN:  Are you having pain? Yes: NPRS scale: 5/10 Pain location: "all over body pain" reports back, knees, hips hurt the worst Pain description: dull, occasional sharp pains Aggravating factors: certain movements Relieving factors: ice  FALLS: Has patient fallen in last 6 months? Yes. Number of falls multiple, reports falling multiple times  a day has had a fall at least once a month where she is injuring herself  LIVING ENVIRONMENT: Lives with: lives alone Lives in: [place] Stairs: Yes: External: flight of steps; bilateral but cannot reach both Has following equipment at home: None  PLOF: Independent, Independent with basic ADLs, and requesting a PCA due to decreased ability to complete IADLs  PATIENT GOALS: assist to adapt and be ahead of the game instead of playing catchup.  OBJECTIVE:   HAND DOMINANCE: Right  ADLs: Overall ADLs: Pt reports difficulty with clothing fasteners, typically wearing "pull on" type clothing and slip on shoes.  Per chart review pt requesting a PCS as she states that she needs assistance with preparing meals, "home work",and showering because she does "not feel" like doing it. Transfers/ambulation related to ADLs: Independent, but does report frequent falls due to "blacking out" Equipment: none  FUNCTIONAL OUTCOME MEASURES: Physical performance test: Handwriting: 11:34 sec*  UPPER EXTREMITY ROM:     Active ROM Right eval Left eval  Shoulder flexion    Shoulder abduction    Shoulder adduction    Shoulder extension    Shoulder internal rotation    Shoulder external  rotation    Elbow flexion    Elbow extension    Wrist flexion 78 77  Wrist extension 67 73  Wrist ulnar deviation 33 42  Wrist radial deviation 15 25  Wrist pronation    Wrist supination    (Blank rows = not tested)  Active ROM Right eval Left eval  Thumb MCP (0-60) 43   Thumb IP (0-80) 75   Thumb Radial abd/add (0-55)    Thumb Palmar abd/add (0-45)     Thumb Opposition to Small Finger     Index MCP (0-90)     Index PIP (0-100)     Index DIP (0-70)      Long MCP (0-90)      Long PIP (0-100)      Long DIP (0-70)      Ring MCP (0-90)      Ring PIP (0-100)      Ring DIP (0-70)      Little MCP (0-90)      Little PIP (0-100)      Little DIP (0-70)      (Blank rows = not tested)  HAND FUNCTION: Grip  strength: Right: 67 lbs; Left: 58 lbs  Right hand thenar and hypothenar muscle atrophy.   COORDINATION: 12/06/22:  9 hole peg test: Right: 30.66 sec and Left: 25.09 sec Box and Blocks: Right: 52 blocks and Left: 56 blocks  SENSATION: Impaired sensation in distal finger tips.  Will need for formally assess at next session  COGNITION: Overall cognitive status: Within functional limits for tasks assessed.  Pt with periods of verbosity and tangential in nature.  OBSERVATIONS: Pt quite verbose and tangential during evaluation requiring increased time and therapeutic use of self as pt with multiple medical conditions and concerns.  Pt reports weakness in BUE, however demonstrating good ROM and strength.   TODAY'S TREATMENT:                                                                                                                              DATE:   12/15/22 Shoulder ROM: engaged in supine bilateral punches 10 x2 with cues for technique and rest break in between sets.   Completed open book stretch with min cues for technique, completing 10 x2.   Wrist/hand ROM: OT instructed in gross grasp with wash cloth for gentle squeeze, wringing towel with BUE to facilitate increased wrist flexion and extension.  OT increased challenge of wrist flexion/extension against mild resistance of red flexbar.   Engaged in AROM progressing to addition of gentle stretch with wrist flexion, extension, and ulnar and radial deviation.  Reviewed thumb adduction/abduction with cues to bend thumb as far across palm and then straighten.  Cues to maintain wrist alignment during ROM in all directions.     12/06/22 Box and Blocks and 9 hole peg test: (see above for measurements) noted decreased wrist extension bilaterally with box and blocks.   UE ROM with focus on  posture and stability progressing to gentle ROM  Engaged in scapular retraction, shoulder rolls while providing education on importance of body posture and  stability around areas of concern.  Educated on use of rolled up towel as needed for support along spine.  Engaged in shoulder flexion, horizontal abduction/adduction for 2-3 mins bilaterally with maintaining ball in hand for improved motor control and gentle gliding.  Transitioned to finger flexion/extension around ball in hand with focus on quality of movement and stability while not overexerting strength and/or pressure.      PATIENT EDUCATION: Education details: ongoing condition specific education Person educated: Patient Education method: Explanation Education comprehension: verbalized understanding and needs further education  HOME EXERCISE PROGRAM: Access Code: [email or web address] URL: [email or web address] Date: 12/15/2022 Prepared by: [facility] - Outpatient  Rehab - [facility]  Program Notes Hypermobility Stability Class: 6 Exercises with a Tennis Ball in 10 minutes! ([email or web address])  Exercises - Seated Scapular Retraction  - 2 x daily - 2 sets - 10 reps - Seated Shoulder Rolls  - 2 x daily - 2 sets - 10 reps - Supine Bilateral Punches  - 2 x daily - 2 sets - 10 reps - Open Book Chest Stretch on Towel Roll  - 2 x daily - 2 sets - 10 reps - Seated Gripping Towel  - 2 x daily - 2 sets - 10 reps - Seated Wrist Flexion and Extension with Towel Twist  - 2 x daily - 2 sets - 10 reps  GOALS: Goals reviewed with patient? No  SHORT TERM GOALS: Target date: 12/30/22  Pt will be independent with HEP for ROM and strengthening. Baseline: no current HEP Goal status: IN PROGRESS  2.  Pt will demonstrate improved UE functional use for ADLs as evidenced by increasing box/ blocks score by >4 blocks with BUE Baseline: Box and Blocks: Right: 52 blocks and Left: 56 blocks Goal status: IN PROGRESS  3.  Pt will demonstrate improved fine motor coordination for ADLs as evidenced by decreasing 9 hole peg test score for BUE by 3 secs Baseline: 9 hole peg test: Right: 30.66 sec and Left:  25.09 sec Goal status: IN PROGRESS  4.  Pt will demonstrate increased thumb ROM as needed to open containers and sustain grasp as needed for meal prep tasks. Baseline: reduced thumb ROM (see measurements above) Goal status:  IN PROGRESS   LONG TERM GOALS: Target date: 01/27/23  Pt will report understanding of recommended adaptive techniques, AE, and/or DME to increase safety and reduce falls in the home (particularly in the bathroom). Baseline: reports frequent falls Goal status:  IN PROGRESS  2.  Pt will demonstrate improved grip strength > 5# bilaterally to increase strength as needed to complete homemaking tasks such as cooking and/or changing sheets.   Baseline: Grip strength: Right: 67 lbs; Left: 58 lbs Goal status:  IN PROGRESS  3.  Pt will write a short paragraph with 100% legibility and no significant fatigue. Baseline: reports decreased ease and endurance with handwriting Goal status:  IN PROGRESS  4.  Pt will improve functional ability by decreased impairment per Quick DASH assessment from by 30% or better, for better quality of life.  Baseline: TBD Goal status:  IN PROGRESS   ASSESSMENT:  CLINICAL IMPRESSION: Pt verbalizing good understanding of cues for pacing self and completing gentle ROM prior to increasing weight and/or resistance in management of EDS symptoms. OT providing intermittent min cues for improved posture and technique.  PERFORMANCE DEFICITS: in functional skills including ADLs,  IADLs, coordination, dexterity, sensation, ROM, strength, pain, flexibility, Fine motor control, Gross motor control, balance, body mechanics, endurance, decreased knowledge of use of DME, and UE functional use and psychosocial skills including environmental adaptation and routines and behaviors.    PLAN:  OT FREQUENCY: 1x/week  OT DURATION: 8 weeks  PLANNED INTERVENTIONS: self care/ADL training, therapeutic exercise, therapeutic activity, neuromuscular re-education, manual  therapy, scar mobilization, passive range of motion, splinting, ultrasound, paraffin, fluidotherapy, compression bandaging, moist heat, cryotherapy, patient/family education, psychosocial skills training, energy conservation, coping strategies training, and DME and/or AE instructions  RECOMMENDED OTHER SERVICES: PT  CONSULTED AND AGREED WITH PLAN OF CARE: Patient  PLAN FOR NEXT SESSION: complete UEFS or QuickDash, review and add to wrist and proximal support strengthening       Billal Rollo, OTR/L 12/15/2022, 9:00 AM

## 2022-12-16 ENCOUNTER — Other Ambulatory Visit: Payer: Medicaid Other | Admitting: Licensed Clinical Social Worker

## 2022-12-16 NOTE — Patient Instructions (Signed)
Visit Information  Lindsey [name] was given information about Medicaid Managed [name] team [name] coordination services as a part of their Healthy [facility] Medicaid benefit. Lindsey [name] verbally consented to engagement with the [facility] Managed [name] team.   If you are experiencing a medical emergency, please call 911 or report to your local emergency department or urgent [name].   If you have a non-emergency medical problem during routine business hours, please contact your provider's office and ask to speak with a nurse.   For questions related to your Healthy [facility] health plan, please call: [phone] or visit the homepage here: [email or web address]  If you would like to schedule transportation through your Healthy [facility] plan, please call the following number at least 2 days in advance of your appointment: [phone]  For information about your ride after you set it up, call Ride Assist at [phone]. Use this number to activate a Will Call pickup, or if your transportation is late for a scheduled pickup. Use this number, too, if you need to make a change or cancel a previously scheduled reservation.  If you need transportation services right away, call [phone]. The after-hours call center is staffed 24 hours to handle ride assistance and urgent reservation requests (including discharges) 365 days a year. Urgent trips include sick visits, hospital discharge requests and life-sustaining treatment.  Call the [facility] Line at [phone], at any time, 24 hours a day, 7 days a week. If you are in danger or need immediate medical attention call 911.  If you would like help to quit smoking, call 1-800-QUIT-NOW (580-132-1587) OR Espaol: 1-855-Djelo-Ya (4-742-595-6387) o para ms informacin haga clic aqu or Text READY to 564-332 to register via text  Following is a copy of your plan of [name]:  [name] Plan : Lindsey [name]   Updates made by Lindsey Bryant, [name] since 12/16/2022 12:00 AM     Problem: Stress management      Goal: Stress Symptoms Identified   Start Date: 10/19/2022  Note:   Short Term Goal High Risk  Start Date- 10/19/22 Follow up Date- Discharge date on 12/16/22  Current Barriers:  Chronic Disease Management support and education needs related to Chronic Pain Mental Health Concerns regarding pain and lack of socialization and support. Patient does not wish to gain mental health support (had therapy in the past and prefers holistic approaches.)  Need for caregiver support   Lindsey [name] Clinical Goal(s):  Patient will verbalize understanding of plan for management of Stress and Chronic Pain as evidenced by patient reports work with Child psychotherapist to address Financial constraints related to limited income related to the management of chronic pain as evidenced by review of EMR and patient or Child psychotherapist report through collaboration with Medical illustrator, provider, and [name] team.      24- Hour Availability:    [facility]  [address] [place], [place] [phone] Crisis [phone]   [facility] [phone]  [place] [facility]  [phone]    [facility]  [phone] (after hours)   Therapeutic Alternative/Mobile Crisis   [phone]   [place] National Suicide Hotline  4096415845 (TALK) [place] 381   Call [phone] for mental health emergencies   [facility]  [phone]);  [place] and [employer]  [phone]); [place], [place], [place], [place], [place], [place], [place]    [facility] [facility] [name] for [facility]  For 24/7 walk-up access to mental health services for [facility] children (4+), adolescents and adults, please visit the [facility] located at [address] in [name], [place].  *[facility] also provides comprehensive outpatient behavioral health services in a variety of locations around the [place].  Connect With [place] [address] [place], [place] [address] HelpLine: [phone] or [phone]  Get Directions  Find Help 24/7 By Phone Call our 24-hour HelpLine at (437) 141-6832 or 204-346-6140 for immediate assistance for mental health and substance abuse issues.  Walk-In Help [place]: [facility] (Ages 4 and Up) [place]: Emergency Dept., [facility] Additional Resources National Hopeline Network: 1-800-SUICIDE The National Suicide Prevention Lifeline: 1-800-273-TALK      Dickie La, BSW, MSW, [name] Managed Medicaid [name] [facility]  [facility] [email or web address] Phone: [phone]

## 2022-12-16 NOTE — Patient Outreach (Signed)
Medicaid Managed Care Social Work Note  12/16/2022 Name:  Lindsey [name] MRN:  [id number] DOB:  1965-09-10  Lindsey [name] is an 57 y.o. year old female who is a primary patient of [place], Timoteo Expose, MD.  The Medicaid Managed Care Coordination team was consulted for assistance with:  Mental Health Counseling and Resources  Lindsey [name] was given information about Medicaid Managed Care Coordination team services today. Lindsey [name] Patient agreed to services and verbal consent obtained.  Engaged with patient  for by telephone forfollow up visit in response to referral for case management and/or care coordination services.   Assessments/Interventions:  Review of past medical history, allergies, medications, health status, including review of consultants reports, laboratory and other test data, was performed as part of comprehensive evaluation and provision of chronic care management services.  SDOH: (Social Determinant of Health) assessments and interventions performed: SDOH Interventions    Flowsheet Row Patient Outreach Telephone from 12/16/2022 in [place] POPULATION HEALTH DEPARTMENT Patient Outreach Telephone from 11/11/2022 in [place] POPULATION HEALTH DEPARTMENT Patient Outreach Telephone from 10/19/2022 in [place] POPULATION HEALTH DEPARTMENT Patient Outreach Telephone from 10/04/2022 in [place] POPULATION HEALTH DEPARTMENT  SDOH Interventions      Food Insecurity Interventions -- -- -- Intervention Not Indicated  Housing Interventions -- -- -- Intervention Not Indicated  Transportation Interventions -- -- -- Intervention Not Indicated  Utilities Interventions -- -- -- Intervention Not Indicated  Stress Interventions Offered [employer], Provide Counseling  [Less pain reported today but still denies mh treatment at this time] [employer], Provide Counseling Offered [employer], Provide Counseling --       Advanced  Directives Status:  See Care Plan for related entries.  Care Plan                 Allergies  Allergen Reactions   Poison Oak Extract [Poison Oak Extract] Anaphylaxis   Xiidra [Lifitegrast] Swelling    Eye swelling and blurred vision.    Levothyroxine Sodium    Ondansetron    Sulfa Antibiotics Nausea And Vomiting   Sulfamethoxazole-Trimethoprim    Tamoxifen Nausea Only and Other (See Comments)    Medications Reviewed Today   Medications were not reviewed in this encounter     Patient Active Problem List   Diagnosis Date Noted   Postural dizziness with presyncope 11/05/2022   Auditory processing disorder 09/22/2022   Addison's disease (HCC) 07/01/2022   Common variable immunodeficiency, unspecified (HCC) 12/02/2021   Postural orthostatic tachycardia syndrome (POTS) 11/25/2021   Vasovagal syncope 11/25/2021   MGUS (monoclonal gammopathy of unknown significance) 10/25/2021   Monoclonal gammopathy 10/25/2021   Acquired primary hypogammaglobulinemia (HCC) 09/10/2021   Carpal tunnel syndrome of right wrist 03/11/2021   Gluten intolerance 04/11/2020   Non-restorative sleep 01/14/2020   Menopausal and female climacteric states 01/14/2020   Insomnia due to medical condition 01/14/2020   Chronic right-sided low back pain 02/22/2018   Hypertrophy of vulva 07/20/2017   Deviated nasal septum 04/11/2017   Osteoarthritis 04/01/2017   Fibromyalgia 08/10/2016   Chronic pain syndrome 08/10/2016   Lumbar radiculopathy 02/16/2016   Hearing impairment 12/04/2015   Chronic cough 12/02/2015   Irritable larynx syndrome 12/02/2015   Genetic testing 09/21/2015   Family history of breast cancer in female 08/26/2015   Fibrocystic breast changes of both breasts 07/19/2015   Carpal tunnel syndrome 07/07/2015   Primary arthrosis of first carpometacarpal joints, bilateral 06/01/2015   Ehlers-Danlos syndrome 05/11/2015  Sacroiliac joint dysfunction of right side 11/26/2014   Hypermobility  syndrome 11/26/2014   Essential alopecia of women 04/14/2011   Inflammatory dermatosis 04/14/2011    Conditions to be addressed/monitored per PCP order:  Depression  Care Plan : LCSW Plan of Care  Updates made by Gustavus Bryant, LCSW since 12/16/2022 12:00 AM     Problem: Stress management      Goal: Stress Symptoms Identified   Start Date: 10/19/2022  Note:   Short Term Goal High Risk  Start Date- 10/19/22 Follow up Date- Discharge date on 12/16/22  Current Barriers:  Chronic Disease Management support and education needs related to Chronic Pain Mental Health Concerns regarding pain and lack of socialization and support. Patient does not wish to gain mental health support (had therapy in the past and prefers holistic approaches.)  Need for caregiver support   [facility] LCSW Clinical Goal(s):  Patient will verbalize understanding of plan for management of Stress and Chronic Pain as evidenced by patient reports work with Child psychotherapist to address Financial constraints related to limited income related to the management of chronic pain as evidenced by review of EMR and patient or Child psychotherapist report through collaboration with Medical illustrator, provider, and care team.    Interventions: Inter-disciplinary care team collaboration (see longitudinal plan of care) Evaluation of current treatment plan related to  self management and patient's adherence to plan as established by provider Advised patient to contact [employer] [phone] for member benefits Encouraged pt to continue daily stretching and spend time in the sunshine and outdoors Discussed importance of adherence to all scheduled medical appointments; Advised patient to report to care team affect of pain on daily activities; Discussed use of relaxation techniques and/or diversional activities to assist with pain reduction (distraction, imagery, relaxation, massage, acupressure, TENS, heat, and cold  application; Advised patient to discuss PCS with provider; patient is interested in caregiver support resources  Patient was encouraged to continue her "pet therapy" Assessed social determinant of health barriers;  BSW involved for financial resources Pharmacy initial appointment scheduled for medication review Advised patient to write down any concerns or questions to discuss with PCP Update- Patient reports that she continues to have chronic pain which affects her daily mood. She does not wish to pursue therapy as "talking about my problems actually makes it worse." She does not wish to pursue mental health treatment at this time. Brief pain management/depression management coping skill education provided during session today on 11/11/22. Update- Patient denies wanting to pursue any mental health treatment at this time. She reports that her pain has been alleviated with the help of PT and new injections. She states that this has helped her mood management. She was advised to continue challenging her negative thinking and to push herself daily to implement healthy self-care into her daily routine to help promote healthy living. Patient is agreeable to social work case closure at this time. Patient shares that she will contact this [facility] LCSW if she changes her mind in the future and wants to consider therapy.   Patient Goals/Self-Care Activities: Attend all scheduled provider appointments Call provider office for new concerns or questions  Work with the [facility] RNCM and social worker to address care coordination needs and will continue to work with the clinical team to address health care and disease management related needs call the [place] National Suicide Prevention Lifeline: 315-799-7007 or TTY: 678-144-1055 TTY (763)506-2421) to talk to a trained counselor call 1-800-273-TALK (toll free, 24  hour hotline) go to [facility] [address] [place] [phone]) if  experiencing a Mental Health or Behavioral Health Crisis           Follow up:  Patient agrees to Care Plan and Follow-up.  Plan: The Managed Medicaid care management team will reach out to the patient again over the next 30 days.  Dickie La, BSW, MSW, [employer] Managed Medicaid LCSW [facility]  [facility] [email or web address] Phone: [phone]

## 2022-12-22 ENCOUNTER — Ambulatory Visit: Payer: Medicaid Other | Admitting: Occupational Therapy

## 2022-12-29 ENCOUNTER — Other Ambulatory Visit (HOSPITAL_COMMUNITY): Payer: Self-pay

## 2022-12-29 ENCOUNTER — Encounter: Payer: Self-pay | Admitting: Allergy

## 2022-12-29 ENCOUNTER — Other Ambulatory Visit (HOSPITAL_BASED_OUTPATIENT_CLINIC_OR_DEPARTMENT_OTHER): Payer: Self-pay

## 2022-12-29 ENCOUNTER — Other Ambulatory Visit: Payer: Self-pay

## 2022-12-29 ENCOUNTER — Other Ambulatory Visit: Payer: Self-pay | Admitting: *Deleted

## 2022-12-29 ENCOUNTER — Ambulatory Visit: Payer: Medicaid Other | Admitting: Occupational Therapy

## 2022-12-29 DIAGNOSIS — M6281 Muscle weakness (generalized): Secondary | ICD-10-CM | POA: Diagnosis not present

## 2022-12-29 DIAGNOSIS — M79642 Pain in left hand: Secondary | ICD-10-CM

## 2022-12-29 DIAGNOSIS — R208 Other disturbances of skin sensation: Secondary | ICD-10-CM

## 2022-12-29 DIAGNOSIS — R278 Other lack of coordination: Secondary | ICD-10-CM

## 2022-12-29 MED ORDER — EPINEPHRINE 0.3 MG/0.3ML IJ SOAJ
0.3000 mg | INTRAMUSCULAR | 1 refills | Status: DC | PRN
  Filled 2022-12-29: qty 2, 30d supply, fill #0

## 2022-12-29 MED ORDER — VALACYCLOVIR HCL 1 G PO TABS
1000.0000 mg | ORAL_TABLET | Freq: Two times a day (BID) | ORAL | 0 refills | Status: DC
Start: 2022-12-29 — End: 2023-07-07
  Filled 2022-12-29: qty 30, 15d supply, fill #0

## 2022-12-29 NOTE — Therapy (Signed)
OUTPATIENT OCCUPATIONAL THERAPY ORTHO  Treatment Note  Patient Name: Lindsey [name] MRN: [id number] DOB:[birth year], 57 y.o., female Today's Date: 12/29/2022  PCP: [place], Betty G, MD REFERRING PROVIDER: [place], Betty G, MD  END OF SESSION:  OT End of Session - 12/29/22 0848     Visit Number 4    Number of Visits 7    Date for OT Re-Evaluation 01/27/23    Authorization Type [place] [employer]    Authorization Time Period approved 6 visits from 12/01/2022 - 01/29/2023    Authorization - Visit Number 3    Authorization - Number of Visits 6    OT Start Time 0848    OT Stop Time 0928    OT Time Calculation (min) 40 min    Activity Tolerance Patient tolerated treatment well                Past Medical History:  Diagnosis Date   Allergy    Angio-edema    Arthritis    Asthma    Cancer (HCC) 10/25/2021   Carpal tunnel syndrome 07/07/2015   right   Chronic cough 12/02/2015   Chronic pain syndrome 08/10/2016   CVID (common variable immunodeficiency) (HCC)    Ehlers-Danlos syndrome    Essential alopecia of women 04/14/2011   Family history of breast cancer in female 08/26/2015   (x2) paternal 1st cousins dx in their 10s    Fibrocystic breast changes of both breasts 07/19/2015   Fibromyalgia    HLD (hyperlipidemia)    Hypermobility syndrome 11/26/2014   Based on Fam Hx and Beighton Score of 7 this is likely ED III    Hypertrophy of vulva 07/20/2017   Inflammatory dermatosis 04/14/2011   Irritable larynx syndrome 12/02/2015   Lumbar radiculopathy 02/16/2016   MGUS (monoclonal gammopathy of unknown significance)    Osteoarthritis 04/01/2017   Osteopenia    Osteoporosis 12/05/2017   Primary arthrosis of first carpometacarpal joints, bilateral 06/01/2015   Sacroiliac joint dysfunction of right side 11/26/2014   B SI joint xrays last done at [facility] Dr. Deanne Coffer 03/13/2017   Skin cancer    Past Surgical History:  Procedure Laterality Date   BREAST  EXCISIONAL BIOPSY Left 01/2019   atypical lobular hyperplasia   BREAST SURGERY  02/05/2019   Atypical Lobular Hyperplasia   CARPAL TUNNEL RELEASE Right 06/2020   COLONOSCOPY     DENTAL SURGERY     ELBOW FRACTURE SURGERY Left    as child   EYE SURGERY  2005   LASIK   FRACTURE SURGERY  1974   L elbow break   JOINT REPLACEMENT  07/07/2020   first metacarpal joint R hand   RADIOACTIVE SEED GUIDED EXCISIONAL BREAST BIOPSY Left 02/05/2019   Procedure: RADIOACTIVE SEED GUIDED EXCISIONAL LEFT BREAST BIOPSY;  Surgeon: Emelia Loron, MD;  Location:  SURGERY CENTER;  Service: General;  Laterality: Left;   SPINE SURGERY  2019   RFA, Barrie Dunker; [facility]   Patient Active Problem List   Diagnosis Date Noted   Postural dizziness with presyncope 11/05/2022   Auditory processing disorder 09/22/2022   Addison's disease (HCC) 07/01/2022   Common variable immunodeficiency, unspecified (HCC) 12/02/2021   Postural orthostatic tachycardia syndrome (POTS) 11/25/2021   Vasovagal syncope 11/25/2021   MGUS (monoclonal gammopathy of unknown significance) 10/25/2021   Monoclonal gammopathy 10/25/2021   Acquired primary hypogammaglobulinemia (HCC) 09/10/2021   Carpal tunnel syndrome of right wrist 03/11/2021   Gluten intolerance 04/11/2020   Non-restorative sleep 01/14/2020  Menopausal and female climacteric states 01/14/2020   Insomnia due to medical condition 01/14/2020   Chronic right-sided low back pain 02/22/2018   Hypertrophy of vulva 07/20/2017   Deviated nasal septum 04/11/2017   Osteoarthritis 04/01/2017   Fibromyalgia 08/10/2016   Chronic pain syndrome 08/10/2016   Lumbar radiculopathy 02/16/2016   Hearing impairment 12/04/2015   Chronic cough 12/02/2015   Irritable larynx syndrome 12/02/2015   Genetic testing 09/21/2015   Family history of breast cancer in female 08/26/2015   Fibrocystic breast changes of both breasts 07/19/2015   Carpal tunnel syndrome  07/07/2015   Primary arthrosis of first carpometacarpal joints, bilateral 06/01/2015   Ehlers-Danlos syndrome 05/11/2015   Sacroiliac joint dysfunction of right side 11/26/2014   Hypermobility syndrome 11/26/2014   Essential alopecia of women 04/14/2011   Inflammatory dermatosis 04/14/2011    ONSET DATE: march 2022  REFERRING DIAG: G56.01 (ICD-10-CM) - Carpal tunnel syndrome of right wrist M79.7 (ICD-10-CM) - Fibromyalgia  THERAPY DIAG:  Muscle weakness (generalized)  Other lack of coordination  Other disturbances of skin sensation  Pain in left hand  Rationale for Evaluation and Treatment: Rehabilitation  SUBJECTIVE:   SUBJECTIVE STATEMENT: Pt reports with the weather change, her hands are sore. Pt accompanied by: self  PERTINENT HISTORY: She has a complex medical history and multiple ongoing health concerns. She reports a history of autoimmune disease, generalized pain (myalgias and arthralgias), asthma, primary immunodeficiency, and smoldering multiple myeloma ("cancer") She has undergone surgery for carpal tunnel syndrome, resulting in significant functional limitations in her right hand, including an inability to open water bottles or pick up objects. States that recovery from this surgery is expected to take up to two years, but she returned to work after three months with accommodations that were not fully implemented.  PRECAUTIONS: Fall  RED FLAGS: None   WEIGHT BEARING RESTRICTIONS: No  PAIN:  Are you having pain? Yes: NPRS scale: 5/10 Pain location: "all over body pain" reports back, knees, hips hurt the worst Pain description: dull, occasional sharp pains Aggravating factors: certain movements Relieving factors: ice  FALLS: Has patient fallen in last 6 months? Yes. Number of falls multiple, reports falling multiple times a day has had a fall at least once a month where she is injuring herself  LIVING ENVIRONMENT: Lives with: lives alone Lives in:  [place] Stairs: Yes: External: flight of steps; bilateral but cannot reach both Has following equipment at home: None  PLOF: Independent, Independent with basic ADLs, and requesting a PCA due to decreased ability to complete IADLs  PATIENT GOALS: assist to adapt and be ahead of the game instead of playing catchup.  OBJECTIVE:   HAND DOMINANCE: Right  ADLs: Overall ADLs: Pt reports difficulty with clothing fasteners, typically wearing "pull on" type clothing and slip on shoes.  Per chart review pt requesting a PCS as she states that she needs assistance with preparing meals, "home work",and showering because she does "not feel" like doing it. Transfers/ambulation related to ADLs: Independent, but does report frequent falls due to "blacking out" Equipment: none  FUNCTIONAL OUTCOME MEASURES: Physical performance test: Handwriting: 11:34 sec* 12/29/22: QuickDASH: 56.82% impairment  UPPER EXTREMITY ROM:     Active ROM Right eval Left eval  Shoulder flexion    Shoulder abduction    Shoulder adduction    Shoulder extension    Shoulder internal rotation    Shoulder external rotation    Elbow flexion    Elbow extension    Wrist flexion 78 77  Wrist  extension 67 73  Wrist ulnar deviation 33 42  Wrist radial deviation 15 25  Wrist pronation    Wrist supination    (Blank rows = not tested)  Active ROM Right eval Left eval  Thumb MCP (0-60) 43   Thumb IP (0-80) 75   Thumb Radial abd/add (0-55)    Thumb Palmar abd/add (0-45)     Thumb Opposition to Small Finger     Index MCP (0-90)     Index PIP (0-100)     Index DIP (0-70)      Long MCP (0-90)      Long PIP (0-100)      Long DIP (0-70)      Ring MCP (0-90)      Ring PIP (0-100)      Ring DIP (0-70)      Little MCP (0-90)      Little PIP (0-100)      Little DIP (0-70)      (Blank rows = not tested)  HAND FUNCTION: Grip strength: Right: 67 lbs; Left: 58 lbs  Right hand thenar and hypothenar muscle  atrophy.   COORDINATION: 12/06/22:  9 hole peg test: Right: 30.66 sec and Left: 25.09 sec Box and Blocks: Right: 52 blocks and Left: 56 blocks  SENSATION: Impaired sensation in distal finger tips.  Will need for formally assess at next session  COGNITION: Overall cognitive status: Within functional limits for tasks assessed.  Pt with periods of verbosity and tangential in nature.  OBSERVATIONS: Pt quite verbose and tangential during evaluation requiring increased time and therapeutic use of self as pt with multiple medical conditions and concerns.  Pt reports weakness in BUE, however demonstrating good ROM and strength.   TODAY'S TREATMENT:                                                                DATE:   12/29/22 QuickDash: 56.82%.  Pt with most difficulty with opening a tight/new jar, doing heavy household chores.  Wrist strengthening: utilized red flexbar and 2# dumbbell with focus on wrist flexion/extension and ulnar/radial deviation.  OT providing min cues and demonstration for technique with flexion/extension to not over rotate to incorporate supination/pronation.  Pt reports plan to purchase a flex bar, therefore discussed resistance level recommendations.  Pt does report having a 2# weight therefore completed each exercise with flex bar and then with dumbbell.      12/15/22 Shoulder ROM: engaged in supine bilateral punches 10 x2 with cues for technique and rest break in between sets.   Completed open book stretch with min cues for technique, completing 10 x2.   Wrist/hand ROM: OT instructed in gross grasp with wash cloth for gentle squeeze, wringing towel with BUE to facilitate increased wrist flexion and extension.  OT increased challenge of wrist flexion/extension against mild resistance of red flexbar.   Engaged in AROM progressing to addition of gentle stretch with wrist flexion, extension, and ulnar and radial deviation.  Reviewed thumb adduction/abduction with cues to bend  thumb as far across palm and then straighten.  Cues to maintain wrist alignment during ROM in all directions.     12/06/22 Box and Blocks and 9 hole peg test: (see above for measurements) noted decreased wrist extension bilaterally with box  and blocks.   UE ROM with focus on posture and stability progressing to gentle ROM  Engaged in scapular retraction, shoulder rolls while providing education on importance of body posture and stability around areas of concern.  Educated on use of rolled up towel as needed for support along spine.  Engaged in shoulder flexion, horizontal abduction/adduction for 2-3 mins bilaterally with maintaining ball in hand for improved motor control and gentle gliding.  Transitioned to finger flexion/extension around ball in hand with focus on quality of movement and stability while not overexerting strength and/or pressure.      PATIENT EDUCATION: Education details: ongoing condition specific education Person educated: Patient Education method: Explanation Education comprehension: verbalized understanding and needs further education  HOME EXERCISE PROGRAM: Access Code: [email or web address] URL: [email or web address] Date: 12/29/2022 Prepared by: [facility] - Outpatient  Rehab - [facility]  Program Notes Hypermobility Stability Class: 6 Exercises with a Tennis Ball in 10 minutes! ([email or web address])  Exercises - Seated Scapular Retraction  - 2 x daily - 2 sets - 10 reps - Seated Shoulder Rolls  - 2 x daily - 2 sets - 10 reps - Supine Bilateral Punches  - 2 x daily - 2 sets - 10 reps - Open Book Chest Stretch on Towel Roll  - 2 x daily - 2 sets - 10 reps - Seated Gripping Towel  - 2 x daily - 2 sets - 10 reps - Seated Wrist Flexion and Extension with Towel Twist  - 2 x daily - 2 sets - 10 reps - Wrist Flexion and Extension with Resistance Bar  - 2 x daily - 2 sets - 10 reps - Wrist Radial Deviation with Resistance Bar  - 2 x daily - 2 sets - 10 reps - Wrist  Ulnar Deviation with Resistance Bar  - 2 x daily - 2 sets - 10 reps - Seated Wrist Radial Deviation with Dumbbell  - 2 x daily - 2 sets - 10 reps - Seated Wrist Extension with Dumbbell  - 2 x daily - 2 sets - 10 reps - Seated Wrist Flexion with Dumbbell  - 2 x daily - 2 sets - 10 reps  GOALS: Goals reviewed with patient? No  SHORT TERM GOALS: Target date: 12/30/22  Pt will be independent with HEP for ROM and strengthening. Baseline: no current HEP Goal status: IN PROGRESS  2.  Pt will demonstrate improved UE functional use for ADLs as evidenced by increasing box/ blocks score by >4 blocks with BUE Baseline: Box and Blocks: Right: 52 blocks and Left: 56 blocks Goal status: IN PROGRESS  3.  Pt will demonstrate improved fine motor coordination for ADLs as evidenced by decreasing 9 hole peg test score for BUE by 3 secs Baseline: 9 hole peg test: Right: 30.66 sec and Left: 25.09 sec Goal status: IN PROGRESS  4.  Pt will demonstrate increased thumb ROM as needed to open containers and sustain grasp as needed for meal prep tasks. Baseline: reduced thumb ROM (see measurements above) Goal status:  IN PROGRESS   LONG TERM GOALS: Target date: 01/27/23  Pt will report understanding of recommended adaptive techniques, AE, and/or DME to increase safety and reduce falls in the home (particularly in the bathroom). Baseline: reports frequent falls Goal status:  IN PROGRESS  2.  Pt will demonstrate improved grip strength > 5# bilaterally to increase strength as needed to complete homemaking tasks such as cooking and/or changing sheets.   Baseline: Grip strength: Right: 67 lbs; Left: 58 lbs  Goal status:  IN PROGRESS  3.  Pt will write a short paragraph with 100% legibility and no significant fatigue. Baseline: reports decreased ease and endurance with handwriting Goal status:  IN PROGRESS  4.  Pt will improve functional ability by decreased impairment per Quick DASH assessment from by 30% or  better, for better quality of life.  Baseline: TBD Goal status:  IN PROGRESS   ASSESSMENT:  CLINICAL IMPRESSION: Pt verbalizing good understanding of cues for pacing self and completing gentle ROM and use of heat and/or paraffin wax prior to resistance/weight exercises.  Discussed use of ice after exercises for pain management.  Pt plan to purchase a flex bar to allow incorporation of resistance into exercises.  PERFORMANCE DEFICITS: in functional skills including ADLs, IADLs, coordination, dexterity, sensation, ROM, strength, pain, flexibility, Fine motor control, Gross motor control, balance, body mechanics, endurance, decreased knowledge of use of DME, and UE functional use and psychosocial skills including environmental adaptation and routines and behaviors.    PLAN:  OT FREQUENCY: 1x/week  OT DURATION: 8 weeks  PLANNED INTERVENTIONS: self care/ADL training, therapeutic exercise, therapeutic activity, neuromuscular re-education, manual therapy, scar mobilization, passive range of motion, splinting, ultrasound, paraffin, fluidotherapy, compression bandaging, moist heat, cryotherapy, patient/family education, psychosocial skills training, energy conservation, coping strategies training, and DME and/or AE instructions  RECOMMENDED OTHER SERVICES: PT  CONSULTED AND AGREED WITH PLAN OF CARE: Patient  PLAN FOR NEXT SESSION:  review and add to wrist and proximal support strengthening       Hayven Fatima, OTR/L 12/29/2022, 9:32 AM

## 2022-12-30 ENCOUNTER — Other Ambulatory Visit: Payer: Self-pay

## 2022-12-30 ENCOUNTER — Other Ambulatory Visit (HOSPITAL_COMMUNITY): Payer: Self-pay

## 2023-01-02 ENCOUNTER — Telehealth: Payer: Self-pay | Admitting: Family Medicine

## 2023-01-02 NOTE — Telephone Encounter (Signed)
Requesting P2P with Dr [place].

## 2023-01-03 ENCOUNTER — Other Ambulatory Visit: Payer: Self-pay

## 2023-01-03 NOTE — Telephone Encounter (Signed)
I called and spoke with [employer] medical review. They advised peer to peer was in regards to pt's disability benefits. PCP & pt discussed during initial visit and pt was informed that we wouldn't be filling out disability paperwork. [address] medical review has been informed of this and nothing further is needed.

## 2023-01-03 NOTE — Telephone Encounter (Signed)
Please call pt to inquire about her concerns.  I will be glad to see her back in the office if needed. Thanks, BJ

## 2023-01-05 ENCOUNTER — Ambulatory Visit: Payer: Medicaid Other | Admitting: Occupational Therapy

## 2023-01-05 DIAGNOSIS — R208 Other disturbances of skin sensation: Secondary | ICD-10-CM

## 2023-01-05 DIAGNOSIS — M6281 Muscle weakness (generalized): Secondary | ICD-10-CM

## 2023-01-05 DIAGNOSIS — M79641 Pain in right hand: Secondary | ICD-10-CM

## 2023-01-05 DIAGNOSIS — R278 Other lack of coordination: Secondary | ICD-10-CM

## 2023-01-05 DIAGNOSIS — M79642 Pain in left hand: Secondary | ICD-10-CM

## 2023-01-05 NOTE — Therapy (Addendum)
 OUTPATIENT OCCUPATIONAL THERAPY ORTHO  Treatment Note  Patient Name: Lindsey [name] MRN: [id number] DOB:1965/12/14, 57 y.o., female Today's Date: 01/05/2023  PCP: [place], Betty G, MD REFERRING PROVIDER: [place], Betty G, MD  END OF SESSION:  OT End of Session - 01/05/23 1518     Visit Number 5    Number of Visits 7    Date for OT Re-Evaluation 01/27/23    Authorization Type [place] [employer]    Authorization Time Period approved 6 visits from 12/01/2022 - 01/29/2023    Authorization - Visit Number 4    Authorization - Number of Visits 6    OT Start Time 1407    OT Stop Time 1453    OT Time Calculation (min) 46 min    Activity Tolerance Patient tolerated treatment well                 Past Medical History:  Diagnosis Date   Allergy    Angio-edema    Arthritis    Asthma    Cancer (HCC) 10/25/2021   Carpal tunnel syndrome 07/07/2015   right   Chronic cough 12/02/2015   Chronic pain syndrome 08/10/2016   CVID (common variable immunodeficiency) (HCC)    Ehlers-Danlos syndrome    Essential alopecia of women 04/14/2011   Family history of breast cancer in female 08/26/2015   (x2) paternal 1st cousins dx in their 49s    Fibrocystic breast changes of both breasts 07/19/2015   Fibromyalgia    HLD (hyperlipidemia)    Hypermobility syndrome 11/26/2014   Based on Fam Hx and Beighton Score of 7 this is likely ED III    Hypertrophy of vulva 07/20/2017   Inflammatory dermatosis 04/14/2011   Irritable larynx syndrome 12/02/2015   Lumbar radiculopathy 02/16/2016   MGUS (monoclonal gammopathy of unknown significance)    Osteoarthritis 04/01/2017   Osteopenia    Osteoporosis 12/05/2017   Primary arthrosis of first carpometacarpal joints, bilateral 06/01/2015   Sacroiliac joint dysfunction of right side 11/26/2014   B SI joint xrays last done at [facility] Dr. Deanne Coffer 03/13/2017   Skin cancer    Past Surgical History:  Procedure Laterality Date   BREAST  EXCISIONAL BIOPSY Left 01/2019   atypical lobular hyperplasia   BREAST SURGERY  02/05/2019   Atypical Lobular Hyperplasia   CARPAL TUNNEL RELEASE Right 06/2020   COLONOSCOPY     DENTAL SURGERY     ELBOW FRACTURE SURGERY Left    as child   EYE SURGERY  2005   LASIK   FRACTURE SURGERY  1974   L elbow break   JOINT REPLACEMENT  07/07/2020   first metacarpal joint R hand   RADIOACTIVE SEED GUIDED EXCISIONAL BREAST BIOPSY Left 02/05/2019   Procedure: RADIOACTIVE SEED GUIDED EXCISIONAL LEFT BREAST BIOPSY;  Surgeon: Emelia Loron, MD;  Location: [facility];  Service: General;  Laterality: Left;   SPINE SURGERY  2019   RFA, Barrie Dunker; [facility]   Patient Active Problem List   Diagnosis Date Noted   Postural dizziness with presyncope 11/05/2022   Auditory processing disorder 09/22/2022   Addison's disease (HCC) 07/01/2022   Common variable immunodeficiency, unspecified (HCC) 12/02/2021   Postural orthostatic tachycardia syndrome (POTS) 11/25/2021   Vasovagal syncope 11/25/2021   MGUS (monoclonal gammopathy of unknown significance) 10/25/2021   Monoclonal gammopathy 10/25/2021   Acquired primary hypogammaglobulinemia (HCC) 09/10/2021   Carpal tunnel syndrome of right wrist 03/11/2021   Gluten intolerance 04/11/2020   Non-restorative sleep  01/14/2020   Menopausal and female climacteric states 01/14/2020   Insomnia due to medical condition 01/14/2020   Chronic right-sided low back pain 02/22/2018   Hypertrophy of vulva 07/20/2017   Deviated nasal septum 04/11/2017   Osteoarthritis 04/01/2017   Fibromyalgia 08/10/2016   Chronic pain syndrome 08/10/2016   Lumbar radiculopathy 02/16/2016   Hearing impairment 12/04/2015   Chronic cough 12/02/2015   Irritable larynx syndrome 12/02/2015   Genetic testing 09/21/2015   Family history of breast cancer in female 08/26/2015   Fibrocystic breast changes of both breasts 07/19/2015   Carpal tunnel syndrome  07/07/2015   Primary arthrosis of first carpometacarpal joints, bilateral 06/01/2015   Ehlers-Danlos syndrome 05/11/2015   Sacroiliac joint dysfunction of right side 11/26/2014   Hypermobility syndrome 11/26/2014   Essential alopecia of women 04/14/2011   Inflammatory dermatosis 04/14/2011    ONSET DATE: march 2022  REFERRING DIAG: G56.01 (ICD-10-CM) - Carpal tunnel syndrome of right wrist M79.7 (ICD-10-CM) - Fibromyalgia  THERAPY DIAG:  Muscle weakness (generalized)  Other lack of coordination  Other disturbances of skin sensation  Pain in left hand  Pain in right hand  Rationale for Evaluation and Treatment: Rehabilitation  SUBJECTIVE:   SUBJECTIVE STATEMENT: Pt reports that she canceled the appt for next week to ensure that we have the correct covered visits. Pt accompanied by: self  PERTINENT HISTORY: She has a complex medical history and multiple ongoing health concerns. She reports a history of autoimmune disease, generalized pain (myalgias and arthralgias), asthma, primary immunodeficiency, and smoldering multiple myeloma ("cancer") She has undergone surgery for carpal tunnel syndrome, resulting in significant functional limitations in her right hand, including an inability to open water bottles or pick up objects. States that recovery from this surgery is expected to take up to two years, but she returned to work after three months with accommodations that were not fully implemented.  PRECAUTIONS: Fall  RED FLAGS: None   WEIGHT BEARING RESTRICTIONS: No  PAIN:  Are you having pain? Yes: NPRS scale: 5/10 Pain location: "all over body pain" reports back, knees, hips hurt the worst Pain description: dull, occasional sharp pains Aggravating factors: certain movements Relieving factors: ice  FALLS: Has patient fallen in last 6 months? Yes. Number of falls multiple, reports falling multiple times a day has had a fall at least once a month where she is injuring  herself  LIVING ENVIRONMENT: Lives with: lives alone Lives in: [place] Stairs: Yes: External: flight of steps; bilateral but cannot reach both Has following equipment at home: None  PLOF: Independent, Independent with basic ADLs, and requesting a PCA due to decreased ability to complete IADLs  PATIENT GOALS: assist to adapt and be ahead of the game instead of playing catchup.  OBJECTIVE:   HAND DOMINANCE: Right  ADLs: Overall ADLs: Pt reports difficulty with clothing fasteners, typically wearing "pull on" type clothing and slip on shoes.  Per chart review pt requesting a PCS as she states that she needs assistance with preparing meals, "home work",and showering because she does "not feel" like doing it. Transfers/ambulation related to ADLs: Independent, but does report frequent falls due to "blacking out" Equipment: none  FUNCTIONAL OUTCOME MEASURES: Physical performance test: Handwriting: 11:34 sec* 12/29/22: QuickDASH: 56.82% impairment  UPPER EXTREMITY ROM:     Active ROM Right eval Left eval  Shoulder flexion    Shoulder abduction    Shoulder adduction    Shoulder extension    Shoulder internal rotation    Shoulder external rotation  Elbow flexion    Elbow extension    Wrist flexion 78 77  Wrist extension 67 73  Wrist ulnar deviation 33 42  Wrist radial deviation 15 25  Wrist pronation    Wrist supination    (Blank rows = not tested)  Active ROM Right eval Left eval  Thumb MCP (0-60) 43   Thumb IP (0-80) 75   Thumb Radial abd/add (0-55)    Thumb Palmar abd/add (0-45)     Thumb Opposition to Small Finger     Index MCP (0-90)     Index PIP (0-100)     Index DIP (0-70)      Long MCP (0-90)      Long PIP (0-100)      Long DIP (0-70)      Ring MCP (0-90)      Ring PIP (0-100)      Ring DIP (0-70)      Little MCP (0-90)      Little PIP (0-100)      Little DIP (0-70)      (Blank rows = not tested)  HAND FUNCTION: Grip strength: Right: 67 lbs; Left: 58  lbs  Right hand thenar and hypothenar muscle atrophy.   COORDINATION: 12/06/22:  9 hole peg test: Right: 30.66 sec and Left: 25.09 sec Box and Blocks: Right: 52 blocks and Left: 56 blocks  SENSATION: Impaired sensation in distal finger tips.  Will need for formally assess at next session  COGNITION: Overall cognitive status: Within functional limits for tasks assessed.  Pt with periods of verbosity and tangential in nature.  OBSERVATIONS: Pt quite verbose and tangential during evaluation requiring increased time and therapeutic use of self as pt with multiple medical conditions and concerns.  Pt reports weakness in BUE, however demonstrating good ROM and strength.   TODAY'S TREATMENT:                                                                DATE:   01/05/23 Grip strengthening: engaged in wrist flexion/extension and ulnar/radial deviation with red flexbar.  Transitioned to ball squeeze with focus on full grasp as needed for opening jars. OT providing cues for sustained grasp for ~5 seconds and keeping forearm in neutral position. Hand Gripper: with RUE and then LUE on level 50#  with yellow spring. Pt picked up 1 inch blocks with gripper with no drops on R and min drops on L.  OT providing education on sustained grasp during structured and routine tasks.  Discussed options for grip strengthening that pt can purchase commercially as pt expressing desire to continue with increased grip strengthening.    12/29/22 QuickDash: 56.82%.  Pt with most difficulty with opening a tight/new jar, doing heavy household chores.  Wrist strengthening: utilized red flexbar and 2# dumbbell with focus on wrist flexion/extension and ulnar/radial deviation.  OT providing min cues and demonstration for technique with flexion/extension to not over rotate to incorporate supination/pronation.  Pt reports plan to purchase a flex bar, therefore discussed resistance level recommendations.  Pt does report having a 2#  weight therefore completed each exercise with flex bar and then with dumbbell.      12/15/22 Shoulder ROM: engaged in supine bilateral punches 10 x2 with cues for technique and rest break  in between sets.   Completed open book stretch with min cues for technique, completing 10 x2.   Wrist/hand ROM: OT instructed in gross grasp with wash cloth for gentle squeeze, wringing towel with BUE to facilitate increased wrist flexion and extension.  OT increased challenge of wrist flexion/extension against mild resistance of red flexbar.   Engaged in AROM progressing to addition of gentle stretch with wrist flexion, extension, and ulnar and radial deviation.  Reviewed thumb adduction/abduction with cues to bend thumb as far across palm and then straighten.  Cues to maintain wrist alignment during ROM in all directions.     PATIENT EDUCATION: Education details: ongoing condition specific education Person educated: Patient Education method: Explanation Education comprehension: verbalized understanding and needs further education  HOME EXERCISE PROGRAM: Access Code: [email or web address] URL: [email or web address] Date: 12/29/2022 Prepared by: [facility] - Outpatient  Rehab - [facility]  Program Notes Hypermobility Stability Class: 6 Exercises with a Tennis Ball in 10 minutes! ([email or web address])  Exercises - Seated Scapular Retraction  - 2 x daily - 2 sets - 10 reps - Seated Shoulder Rolls  - 2 x daily - 2 sets - 10 reps - Supine Bilateral Punches  - 2 x daily - 2 sets - 10 reps - Open Book Chest Stretch on Towel Roll  - 2 x daily - 2 sets - 10 reps - Seated Gripping Towel  - 2 x daily - 2 sets - 10 reps - Seated Wrist Flexion and Extension with Towel Twist  - 2 x daily - 2 sets - 10 reps - Wrist Flexion and Extension with Resistance Bar  - 2 x daily - 2 sets - 10 reps - Wrist Radial Deviation with Resistance Bar  - 2 x daily - 2 sets - 10 reps - Wrist Ulnar Deviation with Resistance Bar  - 2 x  daily - 2 sets - 10 reps - Seated Wrist Radial Deviation with Dumbbell  - 2 x daily - 2 sets - 10 reps - Seated Wrist Extension with Dumbbell  - 2 x daily - 2 sets - 10 reps - Seated Wrist Flexion with Dumbbell  - 2 x daily - 2 sets - 10 reps  GOALS: Goals reviewed with patient? No  SHORT TERM GOALS: Target date: 12/30/22  Pt will be independent with HEP for ROM and strengthening. Baseline: no current HEP Goal status: IN PROGRESS  2.  Pt will demonstrate improved UE functional use for ADLs as evidenced by increasing box/ blocks score by >4 blocks with BUE Baseline: Box and Blocks: Right: 52 blocks and Left: 56 blocks Goal status: IN PROGRESS  3.  Pt will demonstrate improved fine motor coordination for ADLs as evidenced by decreasing 9 hole peg test score for BUE by 3 secs Baseline: 9 hole peg test: Right: 30.66 sec and Left: 25.09 sec Goal status: IN PROGRESS  4.  Pt will demonstrate increased thumb ROM as needed to open containers and sustain grasp as needed for meal prep tasks. Baseline: reduced thumb ROM (see measurements above) Goal status:  IN PROGRESS   LONG TERM GOALS: Target date: 01/27/23  Pt will report understanding of recommended adaptive techniques, AE, and/or DME to increase safety and reduce falls in the home (particularly in the bathroom). Baseline: reports frequent falls Goal status:  IN PROGRESS  2.  Pt will demonstrate improved grip strength > 5# bilaterally to increase strength as needed to complete homemaking tasks such as cooking and/or changing sheets.   Baseline: Grip strength: Right:  67 lbs; Left: 58 lbs Goal status:  IN PROGRESS  3.  Pt will write a short paragraph with 100% legibility and no significant fatigue. Baseline: reports decreased ease and endurance with handwriting Goal status:  IN PROGRESS  4.  Pt will improve functional ability by decreased impairment per Quick DASH assessment from by 30% or better, for better quality of life.   Baseline: TBD Goal status:  IN PROGRESS   ASSESSMENT:  CLINICAL IMPRESSION: Pt continues to report onset of discomfort after stretching and grip strengthening, however pleased with ability to complete and motivated to continue towards increased hand mobility and strength.  Discussed farmer's carry as an additional option to facilitate increased functional grip strength.  PERFORMANCE DEFICITS: in functional skills including ADLs, IADLs, coordination, dexterity, sensation, ROM, strength, pain, flexibility, Fine motor control, Gross motor control, balance, body mechanics, endurance, decreased knowledge of use of DME, and UE functional use and psychosocial skills including environmental adaptation and routines and behaviors.    PLAN:  OT FREQUENCY: 1x/week  OT DURATION: 8 weeks  PLANNED INTERVENTIONS: self care/ADL training, therapeutic exercise, therapeutic activity, neuromuscular re-education, manual therapy, scar mobilization, passive range of motion, splinting, ultrasound, paraffin, fluidotherapy, compression bandaging, moist heat, cryotherapy, patient/family education, psychosocial skills training, energy conservation, coping strategies training, and DME and/or AE instructions  RECOMMENDED OTHER SERVICES: PT  CONSULTED AND AGREED WITH PLAN OF CARE: Patient  PLAN FOR NEXT SESSION:  review and add to wrist and proximal support strengthening   Dorothy Polhemus, OTR/L 01/05/2023, 3:18 PM   OCCUPATIONAL THERAPY DISCHARGE SUMMARY  Visits from Start of Care: 5  Current functional level related to goals / functional outcomes: Unsure as pt did not return for additional appts.  Pt called to cancel and canceled via MyChart message and did not set up additional follow up visits.  At above visit, pt demonstrating improved hand mobility and strength.    Remaining deficits: Per above visit, discomfort after stretching and grip strengthening and still reporting increased score on QuickDash.    Education / Equipment: ROM and strengthening with hypermobility.   Patient agrees to discharge. Patient goals were not met. Patient is being discharged due to not returning since the last visit.  Rosalio Loud, OT 06/19/23

## 2023-01-09 ENCOUNTER — Other Ambulatory Visit (HOSPITAL_BASED_OUTPATIENT_CLINIC_OR_DEPARTMENT_OTHER): Payer: Self-pay

## 2023-01-09 MED ORDER — METRONIDAZOLE 500 MG PO TABS
500.0000 mg | ORAL_TABLET | Freq: Two times a day (BID) | ORAL | 0 refills | Status: DC
  Filled 2023-01-09: qty 14, 7d supply, fill #0

## 2023-01-10 ENCOUNTER — Other Ambulatory Visit (HOSPITAL_BASED_OUTPATIENT_CLINIC_OR_DEPARTMENT_OTHER): Payer: Self-pay

## 2023-01-11 ENCOUNTER — Other Ambulatory Visit: Payer: Self-pay

## 2023-01-11 ENCOUNTER — Encounter: Payer: Self-pay | Admitting: Family Medicine

## 2023-01-11 DIAGNOSIS — R42 Dizziness and giddiness: Secondary | ICD-10-CM

## 2023-01-12 ENCOUNTER — Ambulatory Visit: Payer: Medicaid Other | Admitting: Occupational Therapy

## 2023-01-13 ENCOUNTER — Other Ambulatory Visit: Payer: Self-pay | Admitting: Obstetrics and Gynecology

## 2023-01-13 DIAGNOSIS — R928 Other abnormal and inconclusive findings on diagnostic imaging of breast: Secondary | ICD-10-CM

## 2023-01-16 ENCOUNTER — Other Ambulatory Visit: Payer: Medicaid Other | Admitting: *Deleted

## 2023-01-16 ENCOUNTER — Ambulatory Visit (HOSPITAL_BASED_OUTPATIENT_CLINIC_OR_DEPARTMENT_OTHER): Payer: Medicaid Other | Attending: Family Medicine | Admitting: Physical Therapy

## 2023-01-16 NOTE — Patient Outreach (Signed)
Care Management/Care Coordination  RN Case Manager Case Closure Note  01/16/2023 Name: Lindsey [name] MRN: [id number] DOB: July 06, 1965  Lindsey [name] is a 57 y.o. year old female who is a primary care patient of [place], Timoteo Expose, MD. The care management/care coordination team was consulted for assistance with chronic disease management and/or care coordination needs.   Care Plan : RN Care Manager Plan of Care  Updates made by Heidi Dach, RN since 01/16/2023 12:00 AM  Completed 01/16/2023   Problem: Health Management needs related to Chronic Pain Resolved 01/16/2023     Long-Range Goal: Development of Plan of Care to address Health Management needs related to Chronic Pain Completed 01/16/2023  Start Date: 10/11/2022  Expected End Date: 01/09/2023  Note:   Current Barriers:  Chronic Disease Management support and education needs related to Chronic Pain  RNCM Clinical Goal(s):  Patient will verbalize understanding of plan for management of Chronic Pain as evidenced by patient reports attend all scheduled medical appointments: 11/23/22 with Pharmacy, 11/30/22 for OT evaluation as evidenced by provider documentation        work with pharmacist to address Medication concerns related to chronic pain as evidenced by review of EMR and patient or pharmacist report    work with Child psychotherapist to address Financial constraints related to limited income related to the management of chronic pain as evidenced by review of EMR and patient or Child psychotherapist report     through collaboration with Medical illustrator, provider, and care team.   Interventions: Inter-disciplinary care team collaboration (see longitudinal plan of care) Evaluation of current treatment plan related to  self management and patient's adherence to plan as established by provider Advised patient to contact [employer] [phone] for member benefits    Pain:  (Status: Goal Met.) Long Term Goal  Pain assessment  performed Medications reviewed Reviewed provider established plan for pain management; Discussed importance of adherence to all scheduled medical appointments; Advised patient to report to care team affect of pain on daily activities; Discussed use of relaxation techniques and/or diversional activities to assist with pain reduction (distraction, imagery, relaxation, massage, acupressure, TENS, heat, and cold application; Reviewed with patient prescribed pharmacological and nonpharmacological pain relief strategies; Assessed social determinant of health barriers;  Discussed upcoming appointment with Pharmacist, advised to have medications available during visit Advised patient to follow up with PCP for updates on referrals Advised patient to write down any concerns or questions to discuss with PCP Reviewed provider note from Pain Management consult and discussed  Patient Goals/Self-Care Activities: Attend all scheduled provider appointments Call provider office for new concerns or questions  Work with the social worker to address care coordination needs and will continue to work with the clinical team to address health care and disease management related needs call the [place] National Suicide Prevention Lifeline: 502-222-8414 or TTY: (725)022-4874 TTY 938-601-3908) to talk to a trained counselor call 1-800-273-TALK (toll free, 24 hour hotline) go to [facility] [address] [place] [phone]) if experiencing a Mental Health or Behavioral Health Crisis        Plan: The patient has met all care management goals, agreed to case closure, and has been provided with contact information for the care management team. Appropriate care team members and provider have been notified via electronic communication. The care management team is available to at any time in the future should needs arise.   RNCM advised patient to contact [employer]  and provide  with updated PCP information. Patient has an upcoming appointment to establish care with [facility] PCP.  Estanislado Emms RN, BSN [place]  [facility] RN Care Coordinator [phone]

## 2023-01-19 ENCOUNTER — Ambulatory Visit: Payer: Medicaid Other | Admitting: Occupational Therapy

## 2023-01-19 ENCOUNTER — Encounter: Payer: Medicaid Other | Admitting: Occupational Therapy

## 2023-01-25 ENCOUNTER — Encounter: Payer: Self-pay | Admitting: Occupational Therapy

## 2023-01-26 ENCOUNTER — Ambulatory Visit: Payer: Medicaid Other | Admitting: Occupational Therapy

## 2023-01-26 ENCOUNTER — Encounter: Payer: Self-pay | Admitting: Family Medicine

## 2023-01-27 ENCOUNTER — Other Ambulatory Visit: Payer: Self-pay | Admitting: Family Medicine

## 2023-01-27 MED ORDER — DICLOFENAC SODIUM 1 % EX GEL
2.0000 g | Freq: Four times a day (QID) | CUTANEOUS | 6 refills | Status: DC
Start: 2023-01-27 — End: 2023-09-08
  Filled 2023-01-27: qty 200, 30d supply, fill #0
  Filled 2023-02-04: qty 100, 13d supply, fill #0
  Filled 2023-02-27 – 2023-03-06 (×2): qty 100, 13d supply, fill #1
  Filled 2023-04-26: qty 100, 13d supply, fill #2

## 2023-01-28 ENCOUNTER — Other Ambulatory Visit (HOSPITAL_COMMUNITY): Payer: Self-pay

## 2023-01-30 ENCOUNTER — Other Ambulatory Visit (HOSPITAL_COMMUNITY): Payer: Self-pay

## 2023-01-30 ENCOUNTER — Other Ambulatory Visit: Payer: Self-pay | Admitting: Family Medicine

## 2023-01-31 ENCOUNTER — Other Ambulatory Visit: Payer: Self-pay

## 2023-01-31 ENCOUNTER — Other Ambulatory Visit (HOSPITAL_COMMUNITY): Payer: Self-pay

## 2023-02-01 MED ORDER — ALBUTEROL SULFATE HFA 108 (90 BASE) MCG/ACT IN AERS
2.0000 | INHALATION_SPRAY | Freq: Four times a day (QID) | RESPIRATORY_TRACT | 0 refills | Status: AC | PRN
  Filled 2023-02-01: qty 18, 25d supply, fill #0

## 2023-02-02 ENCOUNTER — Other Ambulatory Visit: Payer: Self-pay

## 2023-02-02 ENCOUNTER — Ambulatory Visit: Payer: Medicaid Other | Admitting: Occupational Therapy

## 2023-02-04 ENCOUNTER — Other Ambulatory Visit (HOSPITAL_COMMUNITY): Payer: Self-pay

## 2023-02-06 ENCOUNTER — Other Ambulatory Visit: Payer: Self-pay

## 2023-02-06 ENCOUNTER — Other Ambulatory Visit (HOSPITAL_COMMUNITY): Payer: Self-pay

## 2023-02-07 ENCOUNTER — Other Ambulatory Visit (HOSPITAL_COMMUNITY): Payer: Self-pay

## 2023-02-07 ENCOUNTER — Encounter: Payer: Self-pay | Admitting: Family Medicine

## 2023-02-07 MED ORDER — TRAZODONE HCL 100 MG PO TABS
100.0000 mg | ORAL_TABLET | Freq: Every day | ORAL | 1 refills | Status: DC
  Filled 2023-02-07: qty 135, 67d supply, fill #0
  Filled 2023-04-26: qty 135, 67d supply, fill #1

## 2023-02-08 ENCOUNTER — Other Ambulatory Visit (HOSPITAL_COMMUNITY): Payer: Self-pay

## 2023-02-09 ENCOUNTER — Encounter: Payer: Self-pay | Admitting: Family Medicine

## 2023-02-09 ENCOUNTER — Other Ambulatory Visit (HOSPITAL_COMMUNITY): Payer: Self-pay

## 2023-02-09 MED ORDER — SODIUM FLUORIDE 1.1 % DT PSTE
PASTE | DENTAL | 1 refills | Status: DC
  Filled 2023-02-09: qty 100, 30d supply, fill #0
  Filled 2023-05-21: qty 100, 30d supply, fill #1

## 2023-02-10 ENCOUNTER — Other Ambulatory Visit: Payer: Self-pay

## 2023-02-10 ENCOUNTER — Other Ambulatory Visit (HOSPITAL_COMMUNITY): Payer: Self-pay

## 2023-02-13 ENCOUNTER — Other Ambulatory Visit: Payer: Self-pay

## 2023-02-13 ENCOUNTER — Other Ambulatory Visit (HOSPITAL_COMMUNITY): Payer: Self-pay

## 2023-02-13 ENCOUNTER — Encounter (HOSPITAL_COMMUNITY): Payer: Self-pay

## 2023-02-13 DIAGNOSIS — Z748 Other problems related to care provider dependency: Secondary | ICD-10-CM | POA: Insufficient documentation

## 2023-02-13 DIAGNOSIS — Z789 Other specified health status: Secondary | ICD-10-CM | POA: Insufficient documentation

## 2023-02-13 MED ORDER — CLOBETASOL PROPIONATE 0.05 % EX OINT
1.0000 | TOPICAL_OINTMENT | Freq: Two times a day (BID) | CUTANEOUS | 0 refills | Status: DC | PRN
  Filled 2023-02-13: qty 30, 15d supply, fill #0

## 2023-02-15 ENCOUNTER — Other Ambulatory Visit (HOSPITAL_COMMUNITY): Payer: Self-pay

## 2023-02-15 MED ORDER — COMPRESSOR/NEBULIZER MISC
0 refills | Status: DC
  Filled 2023-02-27: qty 1, fill #0

## 2023-02-27 ENCOUNTER — Other Ambulatory Visit (HOSPITAL_COMMUNITY): Payer: Self-pay

## 2023-02-27 ENCOUNTER — Other Ambulatory Visit: Payer: Self-pay

## 2023-02-27 ENCOUNTER — Ambulatory Visit
Admission: RE | Admit: 2023-02-27 | Discharge: 2023-02-27 | Disposition: A | Discharge status: Home / Self Care | Payer: Medicaid Other | Source: Ambulatory Visit | Attending: Obstetrics and Gynecology | Admitting: Obstetrics and Gynecology

## 2023-02-27 DIAGNOSIS — R928 Other abnormal and inconclusive findings on diagnostic imaging of breast: Secondary | ICD-10-CM

## 2023-02-28 ENCOUNTER — Other Ambulatory Visit: Payer: Self-pay | Admitting: Obstetrics and Gynecology

## 2023-02-28 DIAGNOSIS — R921 Mammographic calcification found on diagnostic imaging of breast: Secondary | ICD-10-CM

## 2023-03-03 ENCOUNTER — Other Ambulatory Visit (HOSPITAL_COMMUNITY): Payer: Self-pay

## 2023-03-06 ENCOUNTER — Other Ambulatory Visit (HOSPITAL_COMMUNITY): Payer: Self-pay

## 2023-03-07 ENCOUNTER — Other Ambulatory Visit (HOSPITAL_COMMUNITY): Payer: Self-pay

## 2023-03-07 ENCOUNTER — Other Ambulatory Visit: Payer: Self-pay

## 2023-03-07 MED ORDER — PROGESTERONE 200 MG PO CAPS
600.0000 mg | ORAL_CAPSULE | Freq: Every day | ORAL | 0 refills | Status: DC
  Filled 2023-03-07: qty 270, 90d supply, fill #0

## 2023-03-10 ENCOUNTER — Encounter: Payer: Self-pay | Admitting: Allergy

## 2023-03-10 ENCOUNTER — Other Ambulatory Visit: Payer: Self-pay

## 2023-03-10 ENCOUNTER — Other Ambulatory Visit (HOSPITAL_COMMUNITY): Payer: Self-pay

## 2023-03-10 ENCOUNTER — Other Ambulatory Visit (HOSPITAL_BASED_OUTPATIENT_CLINIC_OR_DEPARTMENT_OTHER): Payer: Self-pay

## 2023-03-13 ENCOUNTER — Other Ambulatory Visit: Payer: Self-pay

## 2023-03-13 MED ORDER — MONTELUKAST SODIUM 10 MG PO TABS
10.0000 mg | ORAL_TABLET | Freq: Every day | ORAL | 0 refills | Status: DC
  Filled 2023-03-13: qty 30, 30d supply, fill #0

## 2023-03-14 ENCOUNTER — Other Ambulatory Visit: Payer: Self-pay

## 2023-03-14 ENCOUNTER — Other Ambulatory Visit (HOSPITAL_COMMUNITY): Payer: Self-pay

## 2023-03-21 ENCOUNTER — Other Ambulatory Visit: Payer: Self-pay

## 2023-03-21 DIAGNOSIS — E279 Disorder of adrenal gland, unspecified: Secondary | ICD-10-CM

## 2023-03-28 ENCOUNTER — Other Ambulatory Visit: Payer: Medicaid Other

## 2023-03-29 ENCOUNTER — Other Ambulatory Visit (HOSPITAL_COMMUNITY): Payer: Self-pay

## 2023-03-29 ENCOUNTER — Other Ambulatory Visit (HOSPITAL_BASED_OUTPATIENT_CLINIC_OR_DEPARTMENT_OTHER): Payer: Self-pay

## 2023-03-30 ENCOUNTER — Other Ambulatory Visit (HOSPITAL_BASED_OUTPATIENT_CLINIC_OR_DEPARTMENT_OTHER): Payer: Self-pay

## 2023-03-30 ENCOUNTER — Other Ambulatory Visit: Payer: Self-pay

## 2023-03-31 ENCOUNTER — Other Ambulatory Visit (HOSPITAL_BASED_OUTPATIENT_CLINIC_OR_DEPARTMENT_OTHER): Payer: Self-pay

## 2023-03-31 ENCOUNTER — Other Ambulatory Visit (HOSPITAL_COMMUNITY): Payer: Self-pay

## 2023-03-31 ENCOUNTER — Encounter: Payer: Self-pay | Admitting: "Endocrinology

## 2023-03-31 ENCOUNTER — Ambulatory Visit: Payer: Medicaid Other | Admitting: "Endocrinology

## 2023-03-31 VITALS — BP 122/80 | HR 97 | Ht 66.0 in | Wt 151.2 lb

## 2023-03-31 DIAGNOSIS — R5382 Chronic fatigue, unspecified: Secondary | ICD-10-CM

## 2023-03-31 NOTE — Progress Notes (Signed)
Outpatient Endocrinology Note Lindsey Mascotte, MD    Lindsey [name] June 02, 1965 [id number]  Referring Provider: [place], Betty G, MD Primary Care Provider: [place], Betty G, MD Reason for consultation: Subjective   Assessment & Plan  [name] was seen today for establish care.  Diagnoses and all orders for this visit:  Chronic fatigue   Pt c/o freezing cold/hot for 10+years, weight gain, dizziness, passing out and fatigue. Has had sleep issues since 5 or more years  Not a diabetic Reports falling numerous times 04/19/22 TSH WNL, not on thyroid medication  Ordered baseline 8 am adrenal labs Weight gain goes against adrenal insufficiency  Recommend ruling out seizures by neurology   Will need follow up if baseline labs abnormal  No follow-ups on file.   I have reviewed current medications, nurse's notes, allergies, vital signs, past medical and surgical history, family medical history, and social history for this encounter. Counseled patient on symptoms, examination findings, lab findings, imaging results, treatment decisions and monitoring and prognosis. The patient understood the recommendations and agrees with the treatment plan. All questions regarding treatment plan were fully answered.  Lindsey Morton, MD  03/31/23   History of Present Illness HPI  Lindsey [name] is a 57 y.o. year old female who presents for evaluation of fatigue and other associated symptoms.  Pt c/o freezing cold/hot for 10+years, weight gain, dizziness, passing out and fatigue. Has had sleep issues since 5 or more years  Not a diabetic Reports falling numerous times Lives by self Seen a cardiologist, was told vasovagal syncope +-POTS Seen neurology   She weight change Yes, gained 30 lbs but has has cortisone/steroid shot twice in that time, last one about a month ago   moon face No fat pads No increased girth Yes plethora No hyperpigmentation Yes, reports at inner thighs  purple striae  ? proximal muscle weakness No acne No, report sebaceous cysts  vellus/terminal hirsutism No scalp hair loss Yes a history of HTN No a history of hypokalemia No paroxysmal episodes of anxiety Yes tremors No lightheadedness Yes headache Yes palpitation Yes sweating Yes blurry vision Yes  Physical Exam  BP 122/80 (BP Location: Left Arm, Patient Position: Sitting, Cuff Size: Normal)   Pulse 97   Ht 5\' 6"  (1.676 m)   Wt 151 lb 3.2 oz (68.6 kg)   LMP 04/08/2014   SpO2 97%   BMI 24.40 kg/m    Constitutional: well developed, well nourished Head: normocephalic, atraumatic Eyes: sclera anicteric, no redness Neck: supple Lungs: normal respiratory effort Neurology: alert and oriented Skin: dry, no appreciable rashes Musculoskeletal: no appreciable defects Psychiatric: normal mood and affect   Current Medications Patient's Medications  New Prescriptions   No medications on file  Previous Medications   ALBUTEROL (VENTOLIN HFA) 108 (90 BASE) MCG/ACT INHALER    Inhale 2 puffs into the lungs every 6 (six) hours as needed for wheezing or shortness of breath.   AMITRIPTYLINE (ELAVIL) 25 MG TABLET    Take 1 tablet (25 mg total) by mouth at bedtime.   CLINPRO 5000 1.1 % PSTE    Place onto teeth.   CLOBETASOL OINTMENT (TEMOVATE) 0.05 %    Apply 1 Application topically 2 (two) times daily as needed.   CLONAZEPAM (KLONOPIN) 0.5 MG TABLET    Take 1 tablet (0.5 mg total) by mouth at bedtime.   CYANOCOBALAMIN (VITAMIN B-12) 5000 MCG SUBL    1 tablet under the tongue and allow to dissolve Sublingual Once a day  CYCLOSPORINE (RESTASIS) 0.05 % OPHTHALMIC EMULSION    Place 1 drop into both eyes 2 (two) times daily.   DICLOFENAC SODIUM (VOLTAREN ARTHRITIS PAIN) 1 % GEL    Apply 2 g topically 4 (four) times daily.   EPINEPHRINE (EPIPEN 2-PAK) 0.3 MG/0.3 ML IJ SOAJ INJECTION    Inject 0.3 mg into the muscle as needed for anaphylaxis.   ESTRADIOL (VIVELLE-DOT) 0.1 MG/24HR PATCH    Place 1 patch onto  the skin 2 (two) times a week.   FEXOFENADINE (ALLEGRA) 180 MG TABLET    Take 1 tablet (180 mg total) by mouth daily.   FLUDROCORTISONE (FLORINEF) 0.1 MG TABLET    Take 1 tablet (0.1 mg total) by mouth 2 (two) times daily.   GABAPENTIN (NEURONTIN) 300 MG CAPSULE    Take 2 capsules (600 mg total) by mouth at bedtime.   IMMUNE GLOBULIN, HUMAN, (HIZENTRA) 10 GM/50ML SOLN    Inject 10 g into the skin once a week.   LEVOCETIRIZINE (XYZAL) 5 MG TABLET    1 tablet in the evening Orally Once a day   LINACLOTIDE (LINZESS) 72 MCG CAPSULE    Take 1 capsule (72 mcg total) by mouth daily.   METHOCARBAMOL (ROBAXIN) 500 MG TABLET    Take 1.5 tablets (750 mg total) by mouth 2 (two) times daily for pain.   MONTELUKAST (SINGULAIR) 10 MG TABLET    Take 1 tablet (10 mg total) by mouth at bedtime.   NEBULIZERS (COMPRESSOR/NEBULIZER) MISC    Nebulizer, tubing and adult mask Diagnosis: Asthma J45.20   PROGESTERONE (PROMETRIUM) 200 MG CAPSULE    Take 3 capsules (600 mg total) by mouth at bedtime.   SODIUM FLUORIDE 1.1 % PSTE    Use as directed   TESTOSTERONE 75 MG PLLT    87.mg every 3 months, insert 1 pellet every 3 months.   TRAZODONE (DESYREL) 100 MG TABLET    Take 1-2 tablets (100-200 mg total) by mouth at bedtime for insomnia.   VALACYCLOVIR (VALTREX) 1000 MG TABLET    Take 1 tablet (1,000 mg total) by mouth every 12 (twelve) hours.  Modified Medications   No medications on file  Discontinued Medications   CLONAZEPAM (KLONOPIN) 0.5 MG TABLET    Take 0.5 mg by mouth at bedtime.   IMMUNE GLOBULIN 10% (GAMUNEX-C) 10 GM/100ML SOLN    Inject 30 g into the vein every 21 ( twenty-one) days.   PROGESTERONE (PROMETRIUM) 200 MG CAPSULE    Take 3 capsules (600 mg total) by mouth at bedtime.    Allergies Allergies  Allergen Reactions   Poison Oak Extract [Poison Oak Extract] Anaphylaxis   Xiidra [Lifitegrast] Swelling    Eye swelling and blurred vision.    Levothyroxine Sodium    Ondansetron    Sulfa Antibiotics  Nausea And Vomiting   Sulfamethoxazole-Trimethoprim    Tamoxifen Nausea Only and Other (See Comments)   Skin Protectants, Misc. Other (See Comments), Rash and Swelling    Past Medical History Past Medical History:  Diagnosis Date   Allergy    Angio-edema    Arthritis    Asthma    Cancer (HCC) 10/25/2021   Carpal tunnel syndrome 07/07/2015   right   Chronic cough 12/02/2015   Chronic pain syndrome 08/10/2016   CVID (common variable immunodeficiency) (HCC)    Ehlers-Danlos syndrome    Essential alopecia of women 04/14/2011   Family history of breast cancer in female 08/26/2015   (x2) paternal 1st cousins dx in their 52s  Fibrocystic breast changes of both breasts 07/19/2015   Fibromyalgia    HLD (hyperlipidemia)    Hypermobility syndrome 11/26/2014   Based on Fam Hx and Beighton Score of 7 this is likely ED III    Hypertrophy of vulva 07/20/2017   Inflammatory dermatosis 04/14/2011   Irritable larynx syndrome 12/02/2015   Lumbar radiculopathy 02/16/2016   MGUS (monoclonal gammopathy of unknown significance)    Osteoarthritis 04/01/2017   Osteopenia    Osteoporosis 12/05/2017   Primary arthrosis of first carpometacarpal joints, bilateral 06/01/2015   Sacroiliac joint dysfunction of right side 11/26/2014   B SI joint xrays last done at [facility] Dr. Deanne Coffer 03/13/2017   Skin cancer     Past Surgical History Past Surgical History:  Procedure Laterality Date   BREAST EXCISIONAL BIOPSY Left 01/2019   atypical lobular hyperplasia   BREAST SURGERY  02/05/2019   Atypical Lobular Hyperplasia   CARPAL TUNNEL RELEASE Right 06/2020   COLONOSCOPY     DENTAL SURGERY     ELBOW FRACTURE SURGERY Left    as child   EYE SURGERY  2005   LASIK   FRACTURE SURGERY  1974   L elbow break   JOINT REPLACEMENT  07/07/2020   first metacarpal joint R hand   RADIOACTIVE SEED GUIDED EXCISIONAL BREAST BIOPSY Left 02/05/2019   Procedure: RADIOACTIVE SEED GUIDED EXCISIONAL LEFT BREAST  BIOPSY;  Surgeon: Emelia Loron, MD;  Location: [facility];  Service: General;  Laterality: Left;   SPINE SURGERY  2019   RFA, Barrie Dunker; [facility]    Family History family history includes Alcohol abuse in her father; Alzheimer's disease in her maternal aunt; Angina in her paternal uncle; Arthritis in her father and mother; Breast cancer in her cousin and cousin; Cancer in her maternal grandfather; Colon cancer in an other family member; Congestive Heart Failure in her maternal grandmother; Hearing loss in her maternal grandmother; Heart Problems in her paternal grandfather, paternal grandmother, and paternal uncle; Heart attack (age of onset: 11) in her paternal uncle; Hyperlipidemia in her father and mother; Hypertension in her father and mother; Other in her father and mother; Prostate cancer in her maternal grandfather; Skin cancer (age of onset: 86) in her father; Stomach cancer in her paternal aunt; Stroke in her father.  Social History Social History   Socioeconomic History   Marital status: Single    Spouse name: n/a   Number of children: 0   Years of education: Not on file   Highest education level: Not on file  Occupational History   Occupation: Pharmacologist    Comment: [employer]  Tobacco Use   Smoking status: Never   Smokeless tobacco: Never   Tobacco comments:    No history; N/A  Vaping Use   Vaping status: Never Used  Substance and Sexual Activity   Alcohol use: No   Drug use: No   Sexual activity: Not Currently    Birth control/protection: Abstinence, Post-menopausal  Other Topics Concern   Not on file  Social History Narrative   Lives with alone   R handed   Caffeine: 1-2 C of coffee in the AM.   Social Drivers of Health   Financial Resource Strain: High Risk (02/12/2023)   Received from [facility]   Overall Financial Resource Strain (CARDIA)    Difficulty of Paying Living Expenses: Very hard   Food Insecurity: Food Insecurity Present (02/12/2023)   Received from [facility]   Hunger Vital Sign  Worried About Programme researcher, broadcasting/film/video in the Last Year: Sometimes true    Ran Out of Food in the Last Year: Sometimes true  Transportation Needs: Unmet Transportation Needs (02/12/2023)   Received from [facility] - Transportation    Lack of Transportation (Medical): Yes    Lack of Transportation (Non-Medical): Yes  Physical Activity: Unknown (02/12/2023)   Received from [facility]   Exercise Vital Sign    Days of Exercise per Week: 0 days    Minutes of Exercise per Session: Not on file  Stress: Stress Concern Present (02/12/2023)   Received from [facility] - Occupational Stress Questionnaire    Feeling of Stress : Rather much  Social Connections: Somewhat Isolated (02/12/2023)   Received from [facility]   Social Network    How would you rate your social network (family, work, friends)?: Restricted participation with some degree of social isolation  Intimate Partner Violence: Not At Risk (02/12/2023)   Received from [facility]   HITS    Over the last 12 months how often did your partner physically hurt you?: Never    Over the last 12 months how often did your partner insult you or talk down to you?: Fairly often    Over the last 12 months how often did your partner threaten you with physical harm?: Never    Over the last 12 months how often did your partner scream or curse at you?: Sometimes    No results found for: "CHOL" No results found for: "HDL" No results found for: "LDLCALC" No results found for: "TRIG" No results found for: "CHOLHDL" Lab Results  Component Value Date   CREATININE 0.97 02/02/2022   No results found for: "GFR"    Component Value Date/Time   NA 137 02/02/2022 1211   K 4.1 02/02/2022 1211   CL 105 02/02/2022 1211   CO2 19 (L) 02/02/2022 1211   GLUCOSE 98 02/02/2022 1211   BUN 11  02/02/2022 1211   CREATININE 0.97 02/02/2022 1211   CREATININE 1.00 10/26/2015 1606   CALCIUM 8.9 02/02/2022 1211   PROT 6.9 02/02/2022 1211   PROT 7.4 08/12/2021 1541   ALBUMIN 3.6 02/02/2022 1211   AST 41 02/02/2022 1211   ALT 84 (H) 02/02/2022 1211   ALKPHOS 147 (H) 02/02/2022 1211   BILITOT 0.4 02/02/2022 1211   GFRNONAA >60 02/02/2022 1211      Latest Ref Rng & Units 02/02/2022   12:11 PM 01/19/2022    3:16 PM 09/09/2021    5:19 PM  BMP  Glucose 70 - 99 mg/dL 98  098  119   BUN 6 - 20 mg/dL 11  12  22    Creatinine 0.44 - 1.00 mg/dL 1.47  8.29  5.62   Sodium 135 - 145 mmol/L 137  134  138   Potassium 3.5 - 5.1 mmol/L 4.1  3.7  3.7   Chloride 98 - 111 mmol/L 105  102  112   CO2 22 - 32 mmol/L 19  20  18    Calcium 8.9 - 10.3 mg/dL 8.9  9.3  8.9        Component Value Date/Time   WBC 11.6 (H) 02/02/2022 1211   RBC 4.15 02/02/2022 1211   HGB 12.9 02/02/2022 1211   HCT 37.6 02/02/2022 1211   PLT 501 (H) 02/02/2022 1211   MCV 90.6 02/02/2022 1211   MCV 86.9 10/26/2015 1622   MCH 31.1 02/02/2022  1211   MCHC 34.3 02/02/2022 1211   RDW 12.3 02/02/2022 1211   LYMPHSABS 0.9 05/24/2021 0440   MONOABS 0.6 05/24/2021 0440   EOSABS 0.2 05/24/2021 0440   BASOSABS 0.1 05/24/2021 0440   Lab Results  Component Value Date   TSH 1.65 10/26/2015   TSH 1.02 07/19/2015         Parts of this note may have been dictated using voice recognition software. There may be variances in spelling and vocabulary which are unintentional. Not all errors are proofread. Please notify the Thereasa Parkin if any discrepancies are noted or if the meaning of any statement is not clear.

## 2023-04-03 ENCOUNTER — Other Ambulatory Visit (HOSPITAL_BASED_OUTPATIENT_CLINIC_OR_DEPARTMENT_OTHER): Payer: Self-pay

## 2023-04-03 MED ORDER — CLONAZEPAM 0.5 MG PO TABS
0.5000 mg | ORAL_TABLET | Freq: Every day | ORAL | 0 refills | Status: DC
  Filled 2023-04-29 – 2023-05-01 (×3): qty 30, 30d supply, fill #0

## 2023-04-07 LAB — ALDOSTERONE + RENIN ACTIVITY W/ RATIO
ALDO / PRA Ratio: 1.7 {ratio} (ref 0.9–28.9)
Aldosterone: 2 ng/dL
Renin Activity: 1.21 ng/mL/h (ref 0.25–5.82)

## 2023-04-07 LAB — ACTH: C206 ACTH: 22 pg/mL (ref 6–50)

## 2023-04-07 LAB — METANEPHRINES, PLASMA
Metanephrine, Free: 59 pg/mL — ABNORMAL HIGH (ref <=57)
Normetanephrine, Free: 47 pg/mL (ref <=148)
Total Metanephrines-Plasma: 106 pg/mL (ref <=205)

## 2023-04-07 LAB — DHEA-SULFATE: DHEA-SO4: 98 ug/dL (ref 5–167)

## 2023-04-07 LAB — CORTISOL: Cortisol, Plasma: 28.3 ug/dL — ABNORMAL HIGH

## 2023-04-26 ENCOUNTER — Other Ambulatory Visit: Payer: Self-pay

## 2023-04-29 ENCOUNTER — Other Ambulatory Visit (HOSPITAL_BASED_OUTPATIENT_CLINIC_OR_DEPARTMENT_OTHER): Payer: Self-pay

## 2023-04-30 ENCOUNTER — Emergency Department (HOSPITAL_BASED_OUTPATIENT_CLINIC_OR_DEPARTMENT_OTHER): Payer: Medicaid Other

## 2023-04-30 ENCOUNTER — Encounter (HOSPITAL_BASED_OUTPATIENT_CLINIC_OR_DEPARTMENT_OTHER): Payer: Self-pay

## 2023-04-30 ENCOUNTER — Other Ambulatory Visit: Payer: Self-pay

## 2023-04-30 ENCOUNTER — Ambulatory Visit (HOSPITAL_COMMUNITY)
Admission: EM | Admit: 2023-04-30 | Discharge: 2023-04-30 | Disposition: A | Discharge status: Home / Self Care | Payer: No Typology Code available for payment source | Source: Ambulatory Visit | Attending: Emergency Medicine | Admitting: Emergency Medicine

## 2023-04-30 ENCOUNTER — Emergency Department (HOSPITAL_BASED_OUTPATIENT_CLINIC_OR_DEPARTMENT_OTHER)
Admission: EM | Admit: 2023-04-30 | Discharge: 2023-04-30 | Disposition: A | Discharge status: Home / Self Care | Payer: Medicaid Other | Attending: Emergency Medicine | Admitting: Emergency Medicine

## 2023-04-30 DIAGNOSIS — M25522 Pain in left elbow: Secondary | ICD-10-CM | POA: Insufficient documentation

## 2023-04-30 DIAGNOSIS — T7421XA Adult sexual abuse, confirmed, initial encounter: Secondary | ICD-10-CM | POA: Insufficient documentation

## 2023-04-30 DIAGNOSIS — S60512A Abrasion of left hand, initial encounter: Secondary | ICD-10-CM | POA: Insufficient documentation

## 2023-04-30 DIAGNOSIS — Z23 Encounter for immunization: Secondary | ICD-10-CM | POA: Diagnosis not present

## 2023-04-30 DIAGNOSIS — Z0441 Encounter for examination and observation following alleged adult rape: Secondary | ICD-10-CM | POA: Diagnosis present

## 2023-04-30 DIAGNOSIS — R519 Headache, unspecified: Secondary | ICD-10-CM | POA: Diagnosis not present

## 2023-04-30 DIAGNOSIS — M542 Cervicalgia: Secondary | ICD-10-CM | POA: Insufficient documentation

## 2023-04-30 DIAGNOSIS — M549 Dorsalgia, unspecified: Secondary | ICD-10-CM | POA: Insufficient documentation

## 2023-04-30 LAB — URINALYSIS, ROUTINE W REFLEX MICROSCOPIC
Bilirubin Urine: NEGATIVE
Glucose, UA: NEGATIVE mg/dL
Hgb urine dipstick: NEGATIVE
Ketones, ur: NEGATIVE mg/dL
Leukocytes,Ua: NEGATIVE
Nitrite: NEGATIVE
Protein, ur: NEGATIVE mg/dL
Specific Gravity, Urine: 1.011 (ref 1.005–1.030)
pH: 5.5 (ref 5.0–8.0)

## 2023-04-30 LAB — COMPREHENSIVE METABOLIC PANEL
ALT: 23 U/L (ref 0–44)
AST: 23 U/L (ref 15–41)
Albumin: 4.4 g/dL (ref 3.5–5.0)
Alkaline Phosphatase: 62 U/L (ref 38–126)
Anion gap: 10 (ref 5–15)
BUN: 22 mg/dL — ABNORMAL HIGH (ref 6–20)
CO2: 20 mmol/L — ABNORMAL LOW (ref 22–32)
Calcium: 8.7 mg/dL — ABNORMAL LOW (ref 8.9–10.3)
Chloride: 105 mmol/L (ref 98–111)
Creatinine, Ser: 1 mg/dL (ref 0.44–1.00)
GFR, Estimated: >60 mL/min (ref >60)
Glucose, Bld: 94 mg/dL (ref 70–99)
Potassium: 3.7 mmol/L (ref 3.5–5.1)
Sodium: 135 mmol/L (ref 135–145)
Total Bilirubin: 0.5 mg/dL (ref 0.0–1.2)
Total Protein: 7.3 g/dL (ref 6.5–8.1)

## 2023-04-30 LAB — CBC WITH DIFFERENTIAL/PLATELET
Abs Immature Granulocytes: 0.01 10*3/uL (ref 0.00–0.07)
Basophils Absolute: 0.1 10*3/uL (ref 0.0–0.1)
Basophils Relative: 1 %
Eosinophils Absolute: 0.1 10*3/uL (ref 0.0–0.5)
Eosinophils Relative: 1 %
HCT: 41.3 % (ref 36.0–46.0)
Hemoglobin: 14.1 g/dL (ref 12.0–15.0)
Immature Granulocytes: 0 %
Lymphocytes Relative: 28 %
Lymphs Abs: 1.6 10*3/uL (ref 0.7–4.0)
MCH: 31.1 pg (ref 26.0–34.0)
MCHC: 34.1 g/dL (ref 30.0–36.0)
MCV: 91.2 fL (ref 80.0–100.0)
Monocytes Absolute: 0.5 10*3/uL (ref 0.1–1.0)
Monocytes Relative: 8 %
Neutro Abs: 3.5 10*3/uL (ref 1.7–7.7)
Neutrophils Relative %: 62 %
Platelets: 294 10*3/uL (ref 150–400)
RBC: 4.53 MIL/uL (ref 3.87–5.11)
RDW: 12.7 % (ref 11.5–15.5)
WBC: 5.7 10*3/uL (ref 4.0–10.5)
nRBC: 0 % (ref 0.0–0.2)

## 2023-04-30 LAB — TROPONIN I (HIGH SENSITIVITY)
Troponin I (High Sensitivity): 2 ng/L (ref <18)
Troponin I (High Sensitivity): 2 ng/L (ref <18)

## 2023-04-30 LAB — ETHANOL: Alcohol, Ethyl (B): <10 mg/dL (ref <10)

## 2023-04-30 LAB — RAPID URINE DRUG SCREEN, HOSP PERFORMED
Amphetamines: NOT DETECTED
Barbiturates: NOT DETECTED
Benzodiazepines: NOT DETECTED
Cocaine: NOT DETECTED
Opiates: NOT DETECTED
Tetrahydrocannabinol: NOT DETECTED

## 2023-04-30 LAB — CK: Total CK: 189 U/L (ref 38–234)

## 2023-04-30 MED ORDER — TETANUS-DIPHTH-ACELL PERTUSSIS 5-2.5-18.5 LF-MCG/0.5 IM SUSY
0.5000 mL | PREFILLED_SYRINGE | Freq: Once | INTRAMUSCULAR | Status: AC
  Administered 2023-04-30: 0.5 mL via INTRAMUSCULAR
  Filled 2023-04-30: qty 0.5

## 2023-04-30 MED ORDER — IBUPROFEN 400 MG PO TABS
400.0000 mg | ORAL_TABLET | Freq: Once | ORAL | Status: DC
  Filled 2023-04-30: qty 1

## 2023-04-30 MED ORDER — PROMETHAZINE HCL 25 MG/ML IJ SOLN
INTRAMUSCULAR | Status: AC
  Administered 2023-04-30: 12.5 mg via INTRAVENOUS
  Filled 2023-04-30: qty 1

## 2023-04-30 MED ORDER — IOHEXOL 350 MG/ML SOLN
100.0000 mL | Freq: Once | INTRAVENOUS | Status: AC | PRN
  Administered 2023-04-30: 100 mL via INTRAVENOUS

## 2023-04-30 MED ORDER — CEFTRIAXONE SODIUM 500 MG IJ SOLR
500.0000 mg | Freq: Once | INTRAMUSCULAR | Status: AC
  Administered 2023-04-30: 500 mg via INTRAMUSCULAR
  Filled 2023-04-30: qty 500

## 2023-04-30 MED ORDER — AZITHROMYCIN 250 MG PO TABS
1000.0000 mg | ORAL_TABLET | Freq: Once | ORAL | Status: AC
Start: 2023-04-30 — End: 2023-04-30
  Administered 2023-04-30: 1000 mg via ORAL
  Filled 2023-04-30: qty 4

## 2023-04-30 MED ORDER — METRONIDAZOLE 500 MG PO TABS
2000.0000 mg | ORAL_TABLET | Freq: Once | ORAL | Status: AC
  Administered 2023-04-30: 2000 mg via ORAL
  Filled 2023-04-30: qty 4

## 2023-04-30 MED ORDER — SODIUM CHLORIDE 0.9 % IV SOLN
12.5000 mg | Freq: Once | INTRAVENOUS | Status: AC
  Administered 2023-04-30: 12.5 mg via INTRAVENOUS
  Filled 2023-04-30: qty 0.5

## 2023-04-30 MED ORDER — LIDOCAINE HCL (PF) 1 % IJ SOLN
1.0000 mL | Freq: Once | INTRAMUSCULAR | Status: AC
  Administered 2023-04-30: 1 mL
  Filled 2023-04-30: qty 5
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
## 2023-04-30 NOTE — ED Notes (Addendum)
-  Called [employer] at 449am for update on when [employer] will arrive, dispatcher stated the watch commander forgot he had to come over and went home instead, she spoke to the watch commander who is now on call and stated they would send someone out but it will not be until day shift comes on after 6am.
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
## 2023-04-30 NOTE — ED Notes (Signed)
-  Called [employer] at 259am to come speak with patient, watch commander will respond once he is done speaking with a patient at [facility].

## 2023-04-30 NOTE — Discharge Instructions (Addendum)
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 questions about this medicine, talk to your doctor, pharmacist, or health care provider.  2024 Elsevier/Gold Standard (2021-06-07 00:00:00)
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
## 2023-05-01 ENCOUNTER — Other Ambulatory Visit (HOSPITAL_COMMUNITY): Payer: Self-pay

## 2023-05-01 ENCOUNTER — Other Ambulatory Visit (HOSPITAL_BASED_OUTPATIENT_CLINIC_OR_DEPARTMENT_OTHER): Payer: Self-pay

## 2023-05-02 ENCOUNTER — Other Ambulatory Visit (HOSPITAL_BASED_OUTPATIENT_CLINIC_OR_DEPARTMENT_OTHER): Payer: Self-pay

## 2023-05-02 MED ORDER — PREDNISOLONE ACETATE 1 % OP SUSP
OPHTHALMIC | 0 refills | Status: DC
  Filled 2023-05-02: qty 10, 14d supply, fill #0

## 2023-05-09 ENCOUNTER — Other Ambulatory Visit (HOSPITAL_COMMUNITY): Payer: Self-pay

## 2023-05-09 MED ORDER — CEPHALEXIN 250 MG PO CAPS
250.0000 mg | ORAL_CAPSULE | Freq: Two times a day (BID) | ORAL | 0 refills | Status: DC
  Filled 2023-05-09 – 2023-05-11 (×2): qty 20, 10d supply, fill #0

## 2023-05-09 MED ORDER — OXYCODONE-ACETAMINOPHEN 5-325 MG PO TABS
1.0000 | ORAL_TABLET | ORAL | 0 refills | Status: DC | PRN
  Filled 2023-05-09: qty 15, 3d supply, fill #0
  Filled 2023-05-11: qty 15, 5d supply, fill #0

## 2023-05-09 MED ORDER — PROMETHAZINE HCL 25 MG PO TABS
25.0000 mg | ORAL_TABLET | Freq: Four times a day (QID) | ORAL | 0 refills | Status: DC | PRN
  Filled 2023-05-09 – 2023-05-11 (×2): qty 15, 4d supply, fill #0

## 2023-05-11 ENCOUNTER — Other Ambulatory Visit (HOSPITAL_COMMUNITY): Payer: Self-pay

## 2023-05-11 ENCOUNTER — Other Ambulatory Visit (HOSPITAL_BASED_OUTPATIENT_CLINIC_OR_DEPARTMENT_OTHER): Payer: Self-pay

## 2023-05-19 ENCOUNTER — Telehealth: Payer: Self-pay | Admitting: Hematology and Oncology

## 2023-05-19 NOTE — Telephone Encounter (Signed)
 Received  staff message patient called to schedule appt. Call returned to patient advising referral was close due to unable to contact patient to schedeule appt.

## 2023-05-21 ENCOUNTER — Other Ambulatory Visit: Payer: Self-pay

## 2023-05-28 ENCOUNTER — Other Ambulatory Visit (HOSPITAL_BASED_OUTPATIENT_CLINIC_OR_DEPARTMENT_OTHER): Payer: Self-pay

## 2023-05-28 ENCOUNTER — Other Ambulatory Visit (HOSPITAL_COMMUNITY): Payer: Self-pay

## 2023-05-30 ENCOUNTER — Other Ambulatory Visit (HOSPITAL_COMMUNITY): Payer: Self-pay

## 2023-05-30 ENCOUNTER — Other Ambulatory Visit (HOSPITAL_BASED_OUTPATIENT_CLINIC_OR_DEPARTMENT_OTHER): Payer: Self-pay

## 2023-05-30 MED ORDER — CLONAZEPAM 0.5 MG PO TABS
0.5000 mg | ORAL_TABLET | Freq: Every day | ORAL | 0 refills | Status: DC
Start: 2023-05-30 — End: 2023-06-29
  Filled 2023-05-30: qty 30, 30d supply, fill #0

## 2023-05-31 ENCOUNTER — Other Ambulatory Visit (HOSPITAL_COMMUNITY): Payer: Self-pay

## 2023-05-31 ENCOUNTER — Other Ambulatory Visit: Payer: Self-pay

## 2023-05-31 MED ORDER — PROGESTERONE 200 MG PO CAPS
600.0000 mg | ORAL_CAPSULE | Freq: Every day | ORAL | 3 refills | Status: AC
  Filled 2023-05-31: qty 270, 90d supply, fill #0
  Filled 2023-08-29: qty 270, 90d supply, fill #1
  Filled 2023-12-01: qty 270, 90d supply, fill #2

## 2023-06-02 ENCOUNTER — Other Ambulatory Visit: Payer: Self-pay

## 2023-06-04 NOTE — Progress Notes (Unsigned)
 FOLLOW UP Date of Service/Encounter:  06/05/23  Subjective:  Lindsey [name] (DOB: 1965-09-03) is a 58 y.o. female who [facility] in re-evaluation of the following: allergic rhinitis with conjunctivitis, asthma, contact dermatitis. CVID,  EDS/POTS, elevated IgA (diagnosed with smoldering myeloma) History obtained from: chart review and patient.  For Review, LV was on 06/24/22  with Dr. Delorse Lek seen for routine follow-up. See below for summary of history and diagnostics.   Therapeutic plans/changes recommended: doing well, continued on current meds of singulair, allergra and zyrtec.  ----------------------------------------------------- Pertinent History/Diagnostics:  Allergic Rhinitis:  nasal congestion/drainage, sneezing, itchy eyes. She was on allergy shots before from 2019- 2020 with [place]. Testing then was positive to pollens (trees and weeds  Current meds: xyzal, singulair  - Serum environmental panel (06/24/22): negative with total IgE 18 CVID Follows with Dr. Allena Katz, on gammunex monthly infusions Smoldering Myeloma:  Follows with heme/Onc ----------------------- Today presents for follow-up. Discussed the use of AI scribe software for clinical note transcription with the patient, who gave verbal consent to proceed.  History of Present Illness   Lindsey [name] is a 58 year old female with CVID and smoldering myeloma who presents with ongoing respiratory symptoms and pain management issues.  She has Common Variable Immunodeficiency (CVID) and experiences significant pain with weekly injections, unable to tolerate topical lidocaine, which is further exacerbated by fibromyalgia. She continues to experience symptoms such as fever, congestion, and coughing, suggesting recurrent infections. She is under the care of Dr. Allena Katz and is scheduled for a follow-up in June. She was last seen 05/22/23. She has not been consistent with her SCIg therapy.  He is aware.  For her allergies, she takes Singulair, Allegra, and Xyzal daily, with Singulair providing the most relief. However, she has been out of Singulair for a couple of months while awaiting the outcome of her recent septoplasty and turbinate reduction surgery on February 3, which improved her breathing but not her drainage. She uses saline flushes twice a day but is non-compliant with nasal sprays. She feels feverish with congestion and coughing, but states this is typical.  She uses albuterol a couple of times a week for asthma symptoms, which include severe coughing at the end of exhalation. She has tried steroid inhalers in the past but experienced adverse effects like yeast infections. She has ordered a nebulizer but has not yet tried it. She feels that the short-acting albuterol requires excessive puffs to be effective.  She has been diagnosed with smoldering myeloma, initially managed by [facility] and referred to [facility]. However, she has faced challenges in securing an appointment due to communication issues and expired referrals. She has been trying to get an appointment with Dr. Jeanie Sewer at [facility] since September 27, but has been unsuccessful due to phone tag and lack of response.   Chart review: Last seen by Dr. Allena Katz 05/22/2023, instructed to continue Hizentra 10 g weekly and immune labs were obtained.  IgG 994     All medications reviewed by clinical staff and updated in chart. No new pertinent medical or surgical history except as noted in HPI.  ROS: All others negative except as noted per HPI.   Objective:  BP 110/70   Pulse 93   Temp 98.2 F (36.8 C)   Ht 5\' 6"  (1.676 m)   Wt 151 lb 4.8 oz (68.6 kg)   LMP 04/08/2014   SpO2 97%   BMI  24.42 kg/m  Body mass index is 24.42 kg/m. Physical Exam: General Appearance:  Alert, cooperative, no distress, appears stated age  Head:  Normocephalic, without obvious abnormality, atraumatic   Eyes:  Conjunctiva clear, EOM's intact  Ears EACs normal bilaterally  Nose: Nares normal, hypertrophic turbinates, normal mucosa, no visible anterior polyps, and septum midline  Throat: Lips, tongue normal; teeth and gums normal, normal posterior oropharynx  Neck: Supple, symmetrical  Lungs:   clear to auscultation bilaterally, Respirations unlabored, no coughing  Heart:  regular rate and rhythm and no murmur, Appears well perfused  Extremities: No edema  Skin: Skin color, texture, turgor normal and no rashes or lesions on visualized portions of skin  Neurologic: No gross deficits   Labs:  Lab Orders  No laboratory test(s) ordered today    Spirometry:  Tracings reviewed. Her effort: Good reproducible efforts. FVC: 2.90L FEV1: 2.34L, 84% predicted FEV1/FVC ratio: 0.81 Interpretation: Spirometry consistent with normal pattern.  Please see scanned spirometry results for details.  Assessment/Plan   Allergic rhinitis with conjunctivitis-not at goal,  Reports persistent drainage despite recent septoplasty and turbinate reduction. Has been off Singulair for a couple of months. Asthma-not at goal Reports needing to use albuterol inhaler at least a couple times a week. Experiencing severe coughing during breathing tests. Contact dermatitis-resolved Elevated IgA --> diagnosed and being monitored for smoldering myeloma-managed by Onc Low IgG, IgM with poor vaccine response --> diagnosed with CVID now on IgG replacement therapy managed by Dr Allena Katz [facility]) Ehlers-Danlos syndrome, POTS  -Continue daily Singulair -Continue Allegra and Xyzal daily -Continue to have access to epinephrine device.   Continue nasal saline rinses 1-2 times daily as needed.  -Have access to Symbicort 80 mcg 2 puffs every 4 hours as needed, max 12 puffs/day. Can also use 2 puffs prior to exercise if that cause symptoms. Or can use 1 vial of albuterol every 4 hours via nebulizer. -environmental allergy panel labs  from 06/24/22 were negative.   -She will continue to follow with heme/onc for smoldering myeloma management -She will continue to follow with immunology for IgG replacement therapy  Follow-up for routine visit in about 6 months or sooner if needed. New referral sent to heme-onc-Dr. Jeanie Sewer.  Patient reports having trouble coordinating with prior office and current referral expired.  Other: none  Tonny Bollman, MD  [facility]

## 2023-06-05 ENCOUNTER — Ambulatory Visit: Payer: Medicaid Other | Admitting: Internal Medicine

## 2023-06-05 ENCOUNTER — Other Ambulatory Visit: Payer: Self-pay

## 2023-06-05 ENCOUNTER — Other Ambulatory Visit (HOSPITAL_COMMUNITY): Payer: Self-pay

## 2023-06-05 ENCOUNTER — Encounter: Payer: Self-pay | Admitting: Internal Medicine

## 2023-06-05 VITALS — BP 110/70 | HR 93 | Temp 98.2°F | Ht 66.0 in | Wt 151.3 lb

## 2023-06-05 DIAGNOSIS — J4540 Moderate persistent asthma, uncomplicated: Secondary | ICD-10-CM | POA: Diagnosis not present

## 2023-06-05 DIAGNOSIS — D839 Common variable immunodeficiency, unspecified: Secondary | ICD-10-CM | POA: Diagnosis not present

## 2023-06-05 DIAGNOSIS — J3089 Other allergic rhinitis: Secondary | ICD-10-CM | POA: Diagnosis not present

## 2023-06-05 DIAGNOSIS — D472 Monoclonal gammopathy: Secondary | ICD-10-CM

## 2023-06-05 MED ORDER — ALBUTEROL SULFATE (2.5 MG/3ML) 0.083% IN NEBU
2.5000 mg | INHALATION_SOLUTION | Freq: Four times a day (QID) | RESPIRATORY_TRACT | 2 refills | Status: AC | PRN
  Filled 2023-06-05: qty 75, 7d supply, fill #0

## 2023-06-05 MED ORDER — BUDESONIDE-FORMOTEROL FUMARATE 80-4.5 MCG/ACT IN AERO
INHALATION_SPRAY | RESPIRATORY_TRACT | 12 refills | Status: DC
  Filled 2023-06-05: qty 10.2, 30d supply, fill #0

## 2023-06-05 MED ORDER — MONTELUKAST SODIUM 10 MG PO TABS
10.0000 mg | ORAL_TABLET | Freq: Every day | ORAL | 1 refills | Status: DC
  Filled 2023-06-05: qty 90, 90d supply, fill #0
  Filled 2023-08-30: qty 90, 90d supply, fill #1

## 2023-06-05 NOTE — Patient Instructions (Addendum)
 Allergic rhinitis with conjunctivitis Asthma Contact dermatitis Elevated IgA --> diagnosed and being monitored for smoldering myeloma Low IgG, IgM with poor vaccine response --> diagnosed with CVID now on IgG replacement therapy managed by Dr Allena Katz [facility]) Ehlers-Danlos syndrome, POTS  -Continue daily Singulair -Continue Allegra and Xyzal daily -Continue to have access to epinephrine device.   Continue nasal saline rinses 1-2 times daily as needed.  -Have access to Symbicort 80 mcg 2 puffs every 4 hours as needed, max 12 puffs/day. Can also use 2 puffs prior to exercise if that cause symptoms. Or can use 1 vial of albuterol every 4 hours via nebulizer. -environmental allergy panel labs from 06/24/22 were negative.   -She will continue to follow with heme/onc for smoldering myeloma management -She will continue to follow with immunology for IgG replacement therapy  Follow-up for routine visit in about 6 months or sooner if needed. New referral sent to heme-onc-Dr. Jeanie Sewer.  Patient reports having trouble coordinating with prior office and current referral expired.

## 2023-06-06 ENCOUNTER — Other Ambulatory Visit: Payer: Self-pay

## 2023-06-08 ENCOUNTER — Other Ambulatory Visit: Payer: Self-pay

## 2023-06-08 ENCOUNTER — Emergency Department (HOSPITAL_BASED_OUTPATIENT_CLINIC_OR_DEPARTMENT_OTHER)
Admission: EM | Admit: 2023-06-08 | Discharge: 2023-06-08 | Discharge status: Against Medical Advice | Payer: Medicaid Other | Source: Home / Self Care

## 2023-06-08 NOTE — Progress Notes (Unsigned)
 Received referral from Dr Maurine Minister, called pateint

## 2023-06-08 NOTE — Progress Notes (Signed)
 Received referral form Dr [facility], asked for Dr Leonides Schanz. Tried to call patient and left message for patient to advise her that we received referral. Left contact number if she would like to call to make appointment.

## 2023-06-20 ENCOUNTER — Other Ambulatory Visit: Payer: Self-pay | Admitting: Physician Assistant

## 2023-06-20 DIAGNOSIS — D472 Monoclonal gammopathy: Secondary | ICD-10-CM

## 2023-06-26 ENCOUNTER — Other Ambulatory Visit (HOSPITAL_BASED_OUTPATIENT_CLINIC_OR_DEPARTMENT_OTHER): Payer: Self-pay

## 2023-06-26 ENCOUNTER — Other Ambulatory Visit (HOSPITAL_COMMUNITY): Payer: Self-pay

## 2023-06-26 ENCOUNTER — Other Ambulatory Visit: Payer: Self-pay | Admitting: Family Medicine

## 2023-06-27 ENCOUNTER — Other Ambulatory Visit: Payer: Self-pay

## 2023-06-29 ENCOUNTER — Other Ambulatory Visit (HOSPITAL_BASED_OUTPATIENT_CLINIC_OR_DEPARTMENT_OTHER): Payer: Self-pay

## 2023-06-29 MED ORDER — CLONAZEPAM 0.5 MG PO TABS
0.5000 mg | ORAL_TABLET | Freq: Every day | ORAL | 0 refills | Status: DC
  Filled 2023-06-29: qty 30, 30d supply, fill #0

## 2023-07-03 ENCOUNTER — Other Ambulatory Visit (HOSPITAL_COMMUNITY): Payer: Self-pay

## 2023-07-03 ENCOUNTER — Other Ambulatory Visit: Payer: Self-pay | Admitting: Family Medicine

## 2023-07-04 ENCOUNTER — Other Ambulatory Visit (HOSPITAL_BASED_OUTPATIENT_CLINIC_OR_DEPARTMENT_OTHER): Payer: Self-pay

## 2023-07-04 ENCOUNTER — Other Ambulatory Visit: Payer: Self-pay

## 2023-07-04 MED ORDER — AMITRIPTYLINE HCL 25 MG PO TABS
25.0000 mg | ORAL_TABLET | Freq: Every day | ORAL | 1 refills | Status: DC
  Filled 2023-07-04: qty 30, 30d supply, fill #0
  Filled 2023-09-21: qty 90, 90d supply, fill #0

## 2023-07-04 MED ORDER — TRAZODONE HCL 100 MG PO TABS
100.0000 mg | ORAL_TABLET | Freq: Every day | ORAL | 1 refills | Status: AC
  Filled 2023-07-04 – 2023-07-05 (×2): qty 180, 90d supply, fill #0
  Filled 2023-09-21: qty 180, 90d supply, fill #1

## 2023-07-04 MED ORDER — GABAPENTIN 300 MG PO CAPS
600.0000 mg | ORAL_CAPSULE | Freq: Every evening | ORAL | 1 refills | Status: AC | PRN
  Filled 2023-07-04 – 2023-10-26 (×2): qty 180, 90d supply, fill #0
  Filled 2024-01-15: qty 180, 90d supply, fill #1

## 2023-07-04 MED ORDER — CLONAZEPAM 0.5 MG PO TABS
0.5000 mg | ORAL_TABLET | Freq: Every evening | ORAL | 0 refills | Status: DC | PRN
  Filled 2023-07-04 – 2023-07-27 (×2): qty 30, 30d supply, fill #0

## 2023-07-04 MED ORDER — METHOCARBAMOL 500 MG PO TABS
1000.0000 mg | ORAL_TABLET | Freq: Two times a day (BID) | ORAL | 1 refills | Status: DC | PRN
Start: 2023-07-04 — End: 2023-11-09
  Filled 2023-07-04 – 2023-07-05 (×2): qty 120, 30d supply, fill #0
  Filled 2023-08-15: qty 120, 30d supply, fill #1
  Filled 2023-09-21: qty 120, 30d supply, fill #2

## 2023-07-05 ENCOUNTER — Inpatient Hospital Stay

## 2023-07-05 ENCOUNTER — Other Ambulatory Visit (HOSPITAL_COMMUNITY): Payer: Self-pay

## 2023-07-05 ENCOUNTER — Telehealth: Payer: Self-pay | Admitting: Hematology and Oncology

## 2023-07-05 ENCOUNTER — Other Ambulatory Visit: Payer: Self-pay

## 2023-07-05 ENCOUNTER — Inpatient Hospital Stay: Attending: Hematology and Oncology | Admitting: Hematology and Oncology

## 2023-07-05 VITALS — BP 114/79 | HR 86 | Temp 97.5°F | Resp 16 | Wt 150.9 lb

## 2023-07-05 DIAGNOSIS — Z803 Family history of malignant neoplasm of breast: Secondary | ICD-10-CM | POA: Insufficient documentation

## 2023-07-05 DIAGNOSIS — D472 Monoclonal gammopathy: Secondary | ICD-10-CM | POA: Diagnosis present

## 2023-07-05 DIAGNOSIS — Z808 Family history of malignant neoplasm of other organs or systems: Secondary | ICD-10-CM | POA: Diagnosis not present

## 2023-07-05 DIAGNOSIS — Z8042 Family history of malignant neoplasm of prostate: Secondary | ICD-10-CM | POA: Diagnosis not present

## 2023-07-05 DIAGNOSIS — Z800 Family history of malignant neoplasm of digestive organs: Secondary | ICD-10-CM | POA: Insufficient documentation

## 2023-07-05 LAB — CBC WITH DIFFERENTIAL (CANCER CENTER ONLY)
Abs Immature Granulocytes: 0.01 10*3/uL (ref 0.00–0.07)
Basophils Absolute: 0.1 10*3/uL (ref 0.0–0.1)
Basophils Relative: 1 %
Eosinophils Absolute: 0.1 10*3/uL (ref 0.0–0.5)
Eosinophils Relative: 2 %
HCT: 43.6 % (ref 36.0–46.0)
Hemoglobin: 15 g/dL (ref 12.0–15.0)
Immature Granulocytes: 0 %
Lymphocytes Relative: 19 %
Lymphs Abs: 1 10*3/uL (ref 0.7–4.0)
MCH: 31 pg (ref 26.0–34.0)
MCHC: 34.4 g/dL (ref 30.0–36.0)
MCV: 90.1 fL (ref 80.0–100.0)
Monocytes Absolute: 0.4 10*3/uL (ref 0.1–1.0)
Monocytes Relative: 7 %
Neutro Abs: 3.9 10*3/uL (ref 1.7–7.7)
Neutrophils Relative %: 71 %
Platelet Count: 270 10*3/uL (ref 150–400)
RBC: 4.84 MIL/uL (ref 3.87–5.11)
RDW: 12.1 % (ref 11.5–15.5)
WBC Count: 5.4 10*3/uL (ref 4.0–10.5)
nRBC: 0 % (ref 0.0–0.2)

## 2023-07-05 LAB — CMP (CANCER CENTER ONLY)
ALT: 18 U/L (ref 0–44)
AST: 20 U/L (ref 15–41)
Albumin: 4.3 g/dL (ref 3.5–5.0)
Alkaline Phosphatase: 67 U/L (ref 38–126)
Anion gap: 7 (ref 5–15)
BUN: 19 mg/dL (ref 6–20)
CO2: 26 mmol/L (ref 22–32)
Calcium: 9.6 mg/dL (ref 8.9–10.3)
Chloride: 104 mmol/L (ref 98–111)
Creatinine: 1.11 mg/dL — ABNORMAL HIGH (ref 0.44–1.00)
GFR, Estimated: 58 mL/min — ABNORMAL LOW (ref >60)
Glucose, Bld: 86 mg/dL (ref 70–99)
Potassium: 4.5 mmol/L (ref 3.5–5.1)
Sodium: 137 mmol/L (ref 135–145)
Total Bilirubin: 0.5 mg/dL (ref 0.0–1.2)
Total Protein: 7.7 g/dL (ref 6.5–8.1)

## 2023-07-05 LAB — LACTATE DEHYDROGENASE: LDH: 134 U/L (ref 98–192)

## 2023-07-05 NOTE — Progress Notes (Signed)
 [facility] [phone]   [phone]  INITIAL CONSULT NOTE  Patient Care Team: Barbarann Ehlers as PCP - General (Physician Assistant) Nahser, Deloris Ping, MD as PCP - Cardiology (Cardiology) Julio Sicks, NP as Nurse Practitioner (Obstetrics and Gynecology) Emelia Loron, MD as Consulting Physician (General Surgery) Sidney Ace, MD as Referring Physician (Allergy) Newman Pies, MD as Consulting Physician (Otolaryngology) Casimer Lanius, MD as Consulting Physician (Rheumatology)  Hematological/Oncological History # IgA Lambda Smoldering Multiple Myeloma  10/21/2021: Bone marrow biopsy showed 5% plasma cells in the aspirate and 10% in the core biopsy. FISH could not be done because of too few plasma cells. Abnormal plasma cell population (0.09%) detected by flow cytometry. Negative for amyloid deposition by Congo red stain  10/25/2021: SPEP M protein 0.74, Lambda 2.7, Kappal .19, Ratio 0.44 11/15/2021: CT skeletal survey showed no suspicious osseous lesions. No CT findings to suggest multiple myeloma  01/06/2023: last visit with Dr. Alvino Chapel at [facility] 07/05/2023: transfer care to Dr. Leonides Schanz from [facility].   CHIEF COMPLAINTS/PURPOSE OF CONSULTATION:  "Smoldering Multiple Myeloma  "  HISTORY OF PRESENTING ILLNESS:  Lindsey [name] 58 y.o. female with medical history significant for smoldering multiple myeloma and immunodeficiency who presents to establish care.  On review of the previous records [name] was previously followed at [facility].  She underwent a bone marrow biopsy on 10/21/2021 which showed 5% plasma cells in the aspirate and 10% of the core biopsy.  She underwent serum testing which showed SPEP with M protein of 0.74, kappa lambda ratio 0.44.  She underwent a CT scan which showed no evidence of suspicious osseous lesions.  The patient was diagnosed with MGUS/borderline smoldering multiple myeloma and was followed  at [facility].  The patient presents today to transfer because our facility is closer to her home.  On exam today [name] reports that she is currently under the care of an immunologist who is local here in [place].  She reports that she believes her smoldering multiple myeloma and immunodeficiencies are due to the use of Roundup.  She reports that she is not having any bone or back pain has had no recent infection such as fevers, chills, sweats.  On further discussion she reports her mother passed away at 79 years old from old age.  Her father had CVAs as well as hypertension hyperlipidemia.  She reports her maternal grandfather had prostate cancer and she has no children and is single.  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  She reports that otherwise she has had no recent major changes in her health.  A full 10 point ROS is otherwise negative.  Today we discussed the diagnosis of smoldering multiple myeloma and steps moving forward.  She voiced understanding of our findings and was in agreement with our plan to continue Dr. Teresa Coombs original recommendations of every 3 month labs with every 6 month clinic visits.   MEDICAL HISTORY:  Past Medical History:  Diagnosis Date   Allergy    Angio-edema    Arthritis    Asthma    Cancer (HCC) 10/25/2021   Carpal tunnel syndrome 07/07/2015   right   Chronic cough 12/02/2015   Chronic pain syndrome 08/10/2016   CVID (common variable immunodeficiency) (HCC)    Ehlers-Danlos syndrome    Essential alopecia of women 04/14/2011   Family history of breast cancer in female 08/26/2015   (  x2) paternal 1st cousins dx in their 74s    Fibrocystic breast changes of both breasts 07/19/2015   Fibromyalgia    HLD (hyperlipidemia)    Hypermobility syndrome 11/26/2014   Based on Fam Hx and Beighton Score of 7 this is likely ED III    Hypertrophy of vulva  07/20/2017   Inflammatory dermatosis 04/14/2011   Irritable larynx syndrome 12/02/2015   Lumbar radiculopathy 02/16/2016   MGUS (monoclonal gammopathy of unknown significance)    Osteoarthritis 04/01/2017   Osteopenia    Osteoporosis 12/05/2017   Primary arthrosis of first carpometacarpal joints, bilateral 06/01/2015   Sacroiliac joint dysfunction of right side 11/26/2014   B SI joint xrays last done at [facility] Dr. Deanne Coffer 03/13/2017   Skin cancer     SURGICAL HISTORY: Past Surgical History:  Procedure Laterality Date   BREAST EXCISIONAL BIOPSY Left 01/2019   atypical lobular hyperplasia   BREAST SURGERY  02/05/2019   Atypical Lobular Hyperplasia   CARPAL TUNNEL RELEASE Right 06/2020   COLONOSCOPY     DENTAL SURGERY     ELBOW FRACTURE SURGERY Left    as child   EYE SURGERY  2005   LASIK   FRACTURE SURGERY  1974   L elbow break   JOINT REPLACEMENT  07/07/2020   first metacarpal joint R hand   RADIOACTIVE SEED GUIDED EXCISIONAL BREAST BIOPSY Left 02/05/2019   Procedure: RADIOACTIVE SEED GUIDED EXCISIONAL LEFT BREAST BIOPSY;  Surgeon: Emelia Loron, MD;  Location: [facility];  Service: General;  Laterality: Left;   SPINE SURGERY  2019   RFA, Barrie Dunker; [facility]    SOCIAL HISTORY: Social History   Socioeconomic History   Marital status: Single    Spouse name: n/a   Number of children: 0   Years of education: Not on file   Highest education level: Not on file  Occupational History   Occupation: Pharmacologist    Comment: [employer]  Tobacco Use   Smoking status: Never   Smokeless tobacco: Never   Tobacco comments:    No history; N/A  Vaping Use   Vaping status: Never Used  Substance and Sexual Activity   Alcohol use: No   Drug use: No   Sexual activity: Not Currently    Birth control/protection: Abstinence, Post-menopausal  Other Topics Concern   Not on file  Social History Narrative   Lives with  alone   R handed   Caffeine: 1-2 C of coffee in the AM.   Social Drivers of Health   Financial Resource Strain: High Risk (02/12/2023)   Received from [facility]   Overall Financial Resource Strain (CARDIA)    Difficulty of Paying Living Expenses: Very hard  Food Insecurity: Food Insecurity Present (02/12/2023)   [facility] from [facility]   Hunger Vital Sign    Worried About Running Out of Food in the Last Year: Sometimes true    Ran Out of Food in the Last Year: Sometimes true  Transportation Needs: Unmet Transportation Needs (02/12/2023)   Received from [facility] - Transportation    Lack of Transportation (Medical): Yes    Lack of Transportation (Non-Medical): Yes  Physical Activity: Unknown (02/12/2023)   Received from [facility]   Exercise Vital Sign    Days of Exercise per Week: 0 days    Minutes of Exercise per Session: Not on file  Stress: Stress Concern Present (02/12/2023)   Received from [facility]   [employer] - Occupational Stress Questionnaire    Feeling of Stress : Rather much  Social Connections: Somewhat Isolated (02/12/2023)   Received from [facility]   Social Network    How would you rate your social network (family, work, friends)?: Restricted participation with some degree of social isolation  Intimate Partner Violence: Not At Risk (02/12/2023)   Received from [facility]   HITS    Over the last 12 months how often did your partner physically hurt you?: Never    Over the last 12 months how often did your partner insult you or talk down to you?: Fairly often    Over the last 12 months how often did your partner threaten you with physical harm?: Never    Over the last 12 months how often did your partner scream or curse at you?: Sometimes    FAMILY HISTORY: Family History  Problem Relation Age of Onset   Hypertension Mother    Other Mother        hx of hysterectomy at 31-56 for prolapsed uterus; hx  of benign L arm cyst in her 36s   Arthritis Mother    Hyperlipidemia Mother    Stroke Father    Hypertension Father    Skin cancer Father 9       NOS   Other Father        enlarged prostate s/p surgery   Alcohol abuse Father    Arthritis Father    Hyperlipidemia Father    Alzheimer's disease Maternal Aunt    Stomach cancer Paternal Aunt        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 75-76   Cancer Maternal Grandfather    Heart Problems Paternal Grandmother    Heart Problems Paternal Grandfather    Colon cancer Other        maternal great aunt (MGM's sister) dx in her 59s   Heart attack Paternal Uncle 3   Heart Problems Paternal Uncle    Angina Paternal Uncle    Breast cancer Cousin        paternal 1st cousin d. 94   Breast cancer Cousin        paternal 1st cousin dx. 50s   Colon polyps Neg Hx    Esophageal cancer Neg Hx    Rectal cancer Neg Hx     ALLERGIES:  is allergic to poison oak extract [poison oak extract]; xiidra [lifitegrast]; levothyroxine sodium; ondansetron; sulfa antibiotics; sulfamethoxazole-trimethoprim; tamoxifen; and skin protectants, misc.Marland Kitchen  MEDICATIONS:  Current Outpatient Medications  Medication Sig Dispense Refill   albuterol (PROVENTIL) (2.5 MG/3ML) 0.083% nebulizer solution Take 3 mLs (2.5 mg total) by nebulization every 6 (six) hours as needed. 75 mL 2   albuterol (VENTOLIN HFA) 108 (90 Base) MCG/ACT inhaler Inhale 2 puffs into the lungs every 6 (six) hours as needed for wheezing or shortness of breath. 18 g 0   amitriptyline (ELAVIL) 25 MG tablet Take 1 tablet (25 mg total) by mouth at bedtime. 90 tablet 2   amitriptyline (ELAVIL) 25 MG tablet Take 1 tablet (25 mg total) by mouth at bedtime. 90 tablet 1   budesonide-formoterol (SYMBICORT) 80-4.5 MCG/ACT inhaler Use  2 puffs every 4 hours as needed, maximum 12 puffs per day  and At onset of respiratory illness/asthma flare: Inhale 2 puffs twice daily with spacer for 2 weeks or until symptoms resolve. 10.2 g 12   cephALEXin (KEFLEX) 250 MG capsule Take 1 capsule (250 mg total) by mouth 2 (two) times daily. 20 capsule 0   CLINPRO 5000 1.1 % PSTE Place onto teeth.     clobetasol ointment (TEMOVATE) 0.05 % Apply 1 Application topically 2 (two) times daily as needed. 30 g 0   clonazePAM (KLONOPIN) 0.5 MG tablet Take 1 tablet (0.5 mg total) by mouth daily. 30 tablet 0   clonazePAM (KLONOPIN) 0.5 MG tablet Take 1 tablet (0.5 mg total) by mouth at bedtime as needed for anxiety 30 tablet 0   Cyanocobalamin (VITAMIN B-12) 5000 MCG SUBL 1 tablet under the tongue and allow to dissolve Sublingual Once a day     cycloSPORINE (RESTASIS) 0.05 % ophthalmic emulsion Place 1 drop into both eyes 2 (two) times daily.     diclofenac Sodium (VOLTAREN ARTHRITIS PAIN) 1 % GEL Apply 2 g topically 4 (four) times daily. 150 g 6   EPINEPHrine (EPIPEN 2-PAK) 0.3 mg/0.3 mL IJ SOAJ injection Inject 0.3 mg into the muscle as needed for anaphylaxis. 1 each 1   estradiol (VIVELLE-DOT) 0.1 MG/24HR patch Place 1 patch onto the skin 2 (two) times a week.     fexofenadine (ALLEGRA) 180 MG tablet Take 1 tablet (180 mg total) by mouth daily. 100 tablet 1   fludrocortisone (FLORINEF) 0.1 MG tablet Take 1 tablet (0.1 mg total) by mouth 2 (two) times daily. 60 tablet 4   gabapentin (NEURONTIN) 300 MG capsule Take 2 capsules (600 mg total) by mouth at bedtime. 60 capsule 5   gabapentin (NEURONTIN) 300 MG capsule Take 2 capsules (600 mg total) by mouth at bedtime as needed. 180 capsule 1   Immune Globulin, Human, (HIZENTRA) 10 GM/50ML SOLN Inject 10 g into the skin once a week.     levocetirizine (XYZAL) 5 MG tablet 1 tablet in the evening Orally Once a day     linaclotide (LINZESS) 72 MCG capsule Take 1 capsule (72 mcg total) by mouth daily. 90 capsule 2    methocarbamol (ROBAXIN) 500 MG tablet Take 1.5 tablets (750 mg total) by mouth 2 (two) times daily for pain. 270 tablet 1   methocarbamol (ROBAXIN) 500 MG tablet Take 2 tablets (1,000 mg total) by mouth 2 (two) times daily as needed. 180 tablet 1   montelukast (SINGULAIR) 10 MG tablet Take 1 tablet (10 mg total) by mouth at bedtime. 90 tablet 1   Nebulizers (COMPRESSOR/NEBULIZER) MISC Nebulizer, tubing and adult mask Diagnosis: Asthma J45.20 1 each 0   oxyCODONE-acetaminophen (PERCOCET) 5-325 MG tablet Take 1 tablet by mouth every 4-6 hours as needed for pain. 15 tablet 0   prednisoLONE acetate (PRED FORTE) 1 % ophthalmic suspension Instill 1 drop into both eyes 4 times daily for 1 week then 1 drop into both eyes twice daily for 1 week. 10 mL 0   progesterone (PROMETRIUM) 200 MG capsule Take 3 capsules (600 mg total) by mouth at bedtime. 270 capsule 3   promethazine (PHENERGAN) 25 MG tablet Take 1 tablet (25 mg total) by mouth 4 (four) times daily as needed. 15 tablet 0   Sodium Fluoride 1.1 % PSTE Use as directed 100 mL 1   Testosterone 75 MG PLLT 87.mg every 3 months, insert 1 pellet every 3 months.     traZODone (DESYREL) 100 MG tablet Take 1-2 tablets (100-200 mg total)  by mouth at bedtime. 180 tablet 1   valACYclovir (VALTREX) 1000 MG tablet Take 1 tablet (1,000 mg total) by mouth every 12 (twelve) hours. 30 tablet 0   No current facility-administered medications for this visit.    REVIEW OF SYSTEMS:   Constitutional: ( - ) fevers, ( - )  chills , ( - ) night sweats Eyes: ( - ) blurriness of vision, ( - ) double vision, ( - ) watery eyes Ears, nose, mouth, throat, and face: ( - ) mucositis, ( - ) sore throat Respiratory: ( - ) cough, ( - ) dyspnea, ( - ) wheezes Cardiovascular: ( - ) palpitation, ( - ) chest discomfort, ( - ) lower extremity swelling Gastrointestinal:  ( - ) nausea, ( - ) heartburn, ( - ) change in bowel habits Skin: ( - ) abnormal skin rashes Lymphatics: ( - ) new  lymphadenopathy, ( - ) easy bruising Neurological: ( - ) numbness, ( - ) tingling, ( - ) new weaknesses Behavioral/Psych: ( - ) mood change, ( - ) new changes  All other systems were reviewed with the patient and are negative.  PHYSICAL EXAMINATION:  Vitals:   07/05/23 0938  BP: 114/79  Pulse: 86  Resp: 16  Temp: (!) 97.5 F (36.4 C)  SpO2: 98%   Filed Weights   07/05/23 0938  Weight: 150 lb 14.4 oz (68.4 kg)    GENERAL: well appearing middle-age Caucasian female in NAD  SKIN: skin color, texture, turgor are normal, no rashes or significant lesions EYES: conjunctiva are pink and non-injected, sclera clear LUNGS: clear to auscultation and percussion with normal breathing effort HEART: regular rate & rhythm and no murmurs and no lower extremity edema Musculoskeletal: no cyanosis of digits and no clubbing  PSYCH: alert & oriented x 3, fluent speech NEURO: no focal motor/sensory deficits  LABORATORY DATA:  I have reviewed the data as listed    Latest Ref Rng & Units 07/05/2023    9:50 AM 04/30/2023    3:19 AM 02/02/2022   12:11 PM  CBC  WBC 4.0 - 10.5 K/uL 5.4  5.7  11.6   Hemoglobin 12.0 - 15.0 g/dL 04.5  40.9  81.1   Hematocrit 36.0 - 46.0 % 43.6  41.3  37.6   Platelets 150 - 400 K/uL 270  294  501        Latest Ref Rng & Units 04/30/2023    3:19 AM 02/02/2022   12:11 PM 01/19/2022    3:16 PM  CMP  Glucose 70 - 99 mg/dL 94  98  914   BUN 6 - 20 mg/dL 22  11  12    Creatinine 0.44 - 1.00 mg/dL 7.82  9.56  2.13   Sodium 135 - 145 mmol/L 135  137  134   Potassium 3.5 - 5.1 mmol/L 3.7  4.1  3.7   Chloride 98 - 111 mmol/L 105  105  102   CO2 22 - 32 mmol/L 20  19  20    Calcium 8.9 - 10.3 mg/dL 8.7  8.9  9.3   Total Protein 6.5 - 8.1 g/dL 7.3  6.9  7.4   Total Bilirubin 0.0 - 1.2 mg/dL 0.5  0.4  0.4   Alkaline Phos 38 - 126 U/L 62  147  47   AST 15 - 41 U/L 23  41  15   ALT 0 - 44 U/L 23  84  11      ASSESSMENT & PLAN Lindsey [name]  Lindsey [name] 58 y.o. female with medical  history significant for smoldering multiple myeloma and immunodeficiency who presents to establish care.  After review of the labs, review of the records, and discussion with the patient the patients findings are most consistent with smoldering multiple myeloma.  The patient was previously diagnosed with [facility] but is transferring care to be closer to home.   # IgA Lambda Smoldering Multiple Myeloma  -- Diagnosis confirmed with elevated protein with IgA lambda specificity in the blood as well as bone marrow biopsy.  Patient reportedly has 5 to 10% plasma cells in the bone marrow, consistent with smoldering multiple myeloma/borderline MGUS -- Today we will order fresh set of labs including SPEP, serum light chains, CBC, CMP. -- No clear indication for repeat bone marrow biopsy this time.  If M protein would arise or if she were to blood crab criteria would recommend pursuing bone marrow biopsy -- Recommend bone imaging every 1 to 2 years with metastatic survey -- Patient transferred to [facility] from [facility] due to are closer proximity to her home. -- Return to clinic in 3 months for labs in 6 months for clinic visit/labs.  No orders of the defined types were placed in this encounter.   All questions were answered. The patient knows to call the clinic with any problems, questions or concerns.  A total of more than 60 minutes were spent on this encounter with face-to-face time and non-face-to-face time, including preparing to see the patient, ordering tests and/or medications, counseling the patient and coordination of care as outlined above.   Ulysees Barns, MD Department of Hematology/Oncology [facility] at [facility] Phone: [phone] Pager: [phone] Email: [email or web address]  07/05/2023 10:29 AM

## 2023-07-05 NOTE — Telephone Encounter (Signed)
 Scheduled appointments per 3/26 LOS notes. The patient is aware of the appointments made.

## 2023-07-06 LAB — KAPPA/LAMBDA LIGHT CHAINS
Kappa free light chain: 10.3 mg/L (ref 3.3–19.4)
Kappa, lambda light chain ratio: 0.46 (ref 0.26–1.65)
Lambda free light chains: 22.3 mg/L (ref 5.7–26.3)

## 2023-07-07 DIAGNOSIS — M25511 Pain in right shoulder: Secondary | ICD-10-CM | POA: Insufficient documentation

## 2023-07-07 DIAGNOSIS — S83289A Other tear of lateral meniscus, current injury, unspecified knee, initial encounter: Secondary | ICD-10-CM | POA: Insufficient documentation

## 2023-07-07 NOTE — Addendum Note (Signed)
 Addended by: Mertha Finders A on: 07/07/2023 03:43 PM   Modules accepted: Orders

## 2023-07-10 LAB — MULTIPLE MYELOMA PANEL, SERUM
Albumin SerPl Elph-Mcnc: 3.8 g/dL (ref 2.9–4.4)
Albumin/Glob SerPl: 1.1 (ref 0.7–1.7)
Alpha 1: 0.2 g/dL (ref 0.0–0.4)
Alpha2 Glob SerPl Elph-Mcnc: 0.7 g/dL (ref 0.4–1.0)
B-Globulin SerPl Elph-Mcnc: 1.8 g/dL — ABNORMAL HIGH (ref 0.7–1.3)
Gamma Glob SerPl Elph-Mcnc: 0.9 g/dL (ref 0.4–1.8)
Globulin, Total: 3.5 g/dL (ref 2.2–3.9)
IgA: 1216 mg/dL — ABNORMAL HIGH (ref 87–352)
IgG (Immunoglobin G), Serum: 945 mg/dL (ref 586–1602)
IgM (Immunoglobulin M), Srm: 34 mg/dL (ref 26–217)
M Protein SerPl Elph-Mcnc: 1 g/dL — ABNORMAL HIGH
Total Protein ELP: 7.3 g/dL (ref 6.0–8.5)

## 2023-07-11 ENCOUNTER — Other Ambulatory Visit (HOSPITAL_BASED_OUTPATIENT_CLINIC_OR_DEPARTMENT_OTHER): Payer: Self-pay

## 2023-07-11 MED ORDER — PREDNISONE 5 MG (21) PO TBPK
ORAL_TABLET | ORAL | 0 refills | Status: DC
  Filled 2023-07-11: qty 21, 6d supply, fill #0

## 2023-07-15 ENCOUNTER — Other Ambulatory Visit (HOSPITAL_COMMUNITY): Payer: Self-pay

## 2023-07-17 ENCOUNTER — Ambulatory Visit: Admitting: Internal Medicine

## 2023-07-21 ENCOUNTER — Other Ambulatory Visit (HOSPITAL_BASED_OUTPATIENT_CLINIC_OR_DEPARTMENT_OTHER): Payer: Self-pay

## 2023-07-28 ENCOUNTER — Other Ambulatory Visit: Payer: Self-pay

## 2023-07-28 ENCOUNTER — Other Ambulatory Visit (HOSPITAL_BASED_OUTPATIENT_CLINIC_OR_DEPARTMENT_OTHER): Payer: Self-pay

## 2023-07-31 ENCOUNTER — Other Ambulatory Visit (HOSPITAL_COMMUNITY): Payer: Self-pay

## 2023-08-02 ENCOUNTER — Other Ambulatory Visit: Payer: Self-pay

## 2023-08-02 ENCOUNTER — Encounter: Payer: Self-pay | Admitting: Pharmacist

## 2023-08-02 ENCOUNTER — Encounter: Payer: Self-pay | Admitting: Internal Medicine

## 2023-08-02 ENCOUNTER — Other Ambulatory Visit (HOSPITAL_COMMUNITY): Payer: Self-pay

## 2023-08-02 MED ORDER — LEVOCETIRIZINE DIHYDROCHLORIDE 5 MG PO TABS
5.0000 mg | ORAL_TABLET | Freq: Every day | ORAL | 5 refills | Status: DC
  Filled 2023-08-02 – 2023-08-03 (×2): qty 30, 30d supply, fill #0
  Filled 2023-08-31: qty 30, 30d supply, fill #1
  Filled 2023-09-21: qty 30, 30d supply, fill #2

## 2023-08-02 MED ORDER — SODIUM FLUORIDE 1.1 % DT PSTE
PASTE | DENTAL | 6 refills | Status: AC
Start: 2023-08-02 — End: ?
  Filled 2023-08-02 – 2023-08-03 (×2): qty 100, 30d supply, fill #0
  Filled 2023-08-15 – 2023-08-31 (×2): qty 100, 30d supply, fill #1
  Filled 2023-09-21: qty 100, 30d supply, fill #2
  Filled 2023-10-30: qty 100, 30d supply, fill #3

## 2023-08-03 ENCOUNTER — Other Ambulatory Visit: Payer: Self-pay

## 2023-08-03 ENCOUNTER — Other Ambulatory Visit (HOSPITAL_COMMUNITY): Payer: Self-pay

## 2023-08-04 ENCOUNTER — Other Ambulatory Visit (HOSPITAL_COMMUNITY): Payer: Self-pay

## 2023-08-04 MED ORDER — ESTRADIOL 0.0375 MG/24HR TD PTTW
1.0000 | MEDICATED_PATCH | TRANSDERMAL | 0 refills | Status: DC
Start: 2023-08-03 — End: 2023-08-09
  Filled 2023-08-04: qty 24, 84d supply, fill #0

## 2023-08-06 ENCOUNTER — Other Ambulatory Visit (HOSPITAL_COMMUNITY): Payer: Self-pay

## 2023-08-09 ENCOUNTER — Encounter: Payer: Self-pay | Admitting: Internal Medicine

## 2023-08-09 ENCOUNTER — Other Ambulatory Visit (HOSPITAL_COMMUNITY): Payer: Self-pay

## 2023-08-09 MED ORDER — ESTRADIOL 0.0375 MG/24HR TD PTTW
1.0000 | MEDICATED_PATCH | TRANSDERMAL | 0 refills | Status: AC
Start: 2023-08-07 — End: ?

## 2023-08-10 ENCOUNTER — Other Ambulatory Visit (HOSPITAL_COMMUNITY): Payer: Self-pay

## 2023-08-10 ENCOUNTER — Encounter: Admitting: Physical Therapy

## 2023-08-14 ENCOUNTER — Ambulatory Visit: Attending: Physician Assistant | Admitting: Physical Therapy

## 2023-08-14 ENCOUNTER — Encounter: Payer: Self-pay | Admitting: Physical Therapy

## 2023-08-14 DIAGNOSIS — M6281 Muscle weakness (generalized): Secondary | ICD-10-CM | POA: Insufficient documentation

## 2023-08-14 DIAGNOSIS — R278 Other lack of coordination: Secondary | ICD-10-CM | POA: Diagnosis present

## 2023-08-14 DIAGNOSIS — M25561 Pain in right knee: Secondary | ICD-10-CM | POA: Insufficient documentation

## 2023-08-14 DIAGNOSIS — M79642 Pain in left hand: Secondary | ICD-10-CM | POA: Insufficient documentation

## 2023-08-14 DIAGNOSIS — G8929 Other chronic pain: Secondary | ICD-10-CM | POA: Diagnosis present

## 2023-08-15 ENCOUNTER — Encounter: Payer: Self-pay | Admitting: Physical Therapy

## 2023-08-15 ENCOUNTER — Other Ambulatory Visit (HOSPITAL_COMMUNITY): Payer: Self-pay

## 2023-08-16 ENCOUNTER — Other Ambulatory Visit: Payer: Self-pay

## 2023-08-16 ENCOUNTER — Other Ambulatory Visit (HOSPITAL_COMMUNITY): Payer: Self-pay

## 2023-08-21 ENCOUNTER — Encounter (HOSPITAL_COMMUNITY): Payer: Self-pay

## 2023-08-21 ENCOUNTER — Other Ambulatory Visit (HOSPITAL_BASED_OUTPATIENT_CLINIC_OR_DEPARTMENT_OTHER): Payer: Self-pay

## 2023-08-21 MED ORDER — SERTRALINE HCL 50 MG PO TABS
25.0000 mg | ORAL_TABLET | Freq: Every day | ORAL | 1 refills | Status: DC
  Filled 2023-08-21: qty 30, 30d supply, fill #0
  Filled 2023-09-21: qty 30, 30d supply, fill #1

## 2023-08-22 NOTE — Therapy (Signed)
 OUTPATIENT PHYSICAL THERAPY FUNCTIONAL CAPACITY EVALUATION  Patient Name: Lindsey [name] MRN: [id number] DOB:02/07/1966, 58 y.o., female Today's Date: 08/14/2023  END OF SESSION:  PT End of Session - 08/22/23 2040     Visit Number 1    Number of Visits 1    Date for PT Re-Evaluation 08/15/23    PT Start Time 1425    PT Stop Time 1618    PT Time Calculation (min) 113 min            Past Medical History:  Diagnosis Date   Allergy     Angio-edema    Arthritis    Asthma    Cancer (HCC) 10/25/2021   Carpal tunnel syndrome 07/07/2015   right   Chronic cough 12/02/2015   Chronic pain syndrome 08/10/2016   CVID (common variable immunodeficiency) (HCC)    Ehlers-Danlos syndrome    Essential alopecia of women 04/14/2011   Family history of breast cancer in female 08/26/2015   (x2) paternal 1st cousins dx in their 58s    Fibrocystic breast changes of both breasts 07/19/2015   Fibromyalgia    HLD (hyperlipidemia)    Hypermobility syndrome 11/26/2014   Based on Fam Hx and Beighton Score of 7 this is likely ED III    Hypertrophy of vulva 07/20/2017   Inflammatory dermatosis 04/14/2011   Irritable larynx syndrome 12/02/2015   Lumbar radiculopathy 02/16/2016   MGUS (monoclonal gammopathy of unknown significance)    Osteoarthritis 04/01/2017   Osteopenia    Osteoporosis 12/05/2017   Primary arthrosis of first carpometacarpal joints, bilateral 06/01/2015   Sacroiliac joint dysfunction of right side 11/26/2014   B SI joint xrays last done at [facility] Dr. Bascom Lily 03/13/2017   Skin cancer    Past Surgical History:  Procedure Laterality Date   BREAST EXCISIONAL BIOPSY Left 01/2019   atypical lobular hyperplasia   BREAST SURGERY  02/05/2019   Atypical Lobular Hyperplasia   CARPAL TUNNEL RELEASE Right 06/2020   COLONOSCOPY     DENTAL SURGERY     ELBOW FRACTURE SURGERY Left    as child   EYE SURGERY  2005   LASIK   FRACTURE SURGERY  1974   L elbow break   JOINT  REPLACEMENT  07/07/2020   first metacarpal joint R hand   RADIOACTIVE SEED GUIDED EXCISIONAL BREAST BIOPSY Left 02/05/2019   Procedure: RADIOACTIVE SEED GUIDED EXCISIONAL LEFT BREAST BIOPSY;  Surgeon: Enid Harry, MD;  Location: [facility];  Service: General;  Laterality: Left;   SPINE SURGERY  2019   RFA, Miki Alert; [facility]   Patient Active Problem List   Diagnosis Date Noted   Other allergic rhinitis 06/05/2023   Moderate persistent asthma, uncomplicated 06/05/2023   Postural dizziness with presyncope 11/05/2022   Auditory processing disorder 09/22/2022   Addison's disease (HCC) 07/01/2022   Common variable immunodeficiency, unspecified (HCC) 12/02/2021   Postural orthostatic tachycardia syndrome (POTS) 11/25/2021   Vasovagal syncope 11/25/2021   MGUS (monoclonal gammopathy of unknown significance) 10/25/2021   Smoldering multiple myeloma (SMM) 10/25/2021   Acquired primary hypogammaglobulinemia (HCC) 09/10/2021   Carpal tunnel syndrome of right wrist 03/11/2021   Gluten intolerance 04/11/2020   Non-restorative sleep 01/14/2020   Menopausal and female climacteric states 01/14/2020   Insomnia due to medical condition 01/14/2020   Chronic right-sided low back pain 02/22/2018   Hypertrophy of vulva 07/20/2017   Deviated nasal septum 04/11/2017   Osteoarthritis 04/01/2017   Fibromyalgia 08/10/2016   Chronic pain syndrome 08/10/2016  Lumbar radiculopathy 02/16/2016   Hearing impairment 12/04/2015   Chronic cough 12/02/2015   Irritable larynx syndrome 12/02/2015   Genetic testing 09/21/2015   Family history of breast cancer in female 08/26/2015   Fibrocystic breast changes of both breasts 07/19/2015   Carpal tunnel syndrome 07/07/2015   Primary arthrosis of first carpometacarpal joints, bilateral 06/01/2015   Ehlers-Danlos syndrome 05/11/2015   Sacroiliac joint dysfunction of right side 11/26/2014   Hypermobility syndrome 11/26/2014    Essential alopecia of women 04/14/2011   Inflammatory dermatosis 04/14/2011    PCP: Tova Fresh, PA-C  REFERRING PROVIDER: Tova Fresh, PA-C  REFERRING DIAG: Roddie Cisco Danlos Syndrome  THERAPY DIAG:  Muscle weakness (generalized)  Other lack of coordination  Pain in left hand  Chronic pain of right knee  Other chronic pain  Rationale for Evaluation and Treatment: Rehabilitation  ONSET DATE: 04/14/2014  SUBJECTIVE:   SUBJECTIVE STATEMENT: See FCE  PERTINENT HISTORY: See FCE PAIN:  Are you having pain? yes  PRECAUTIONS: None  RED FLAGS: None   WEIGHT BEARING RESTRICTIONS: No  LIVING ENVIRONMENT: Lives with: lives alone Lives in: House/apartment  PLOF: Independent  PATIENT GOALS: complete FCE report   Name: [name], [name] Injury/Onset Date: 04/14/2014 Evaluation Date: 08/14/2023 Test Start, End, Duration: 2:25 PM, 4:18 PM, 1:53 hours Diagnosis: Karole Pacer Syndrome Height, Weight: 5'6'', 150lb Starting BP, HR, Pain: 121/85, 83 bpm, Pain 3 out of 10  This report summarizes the results of the [employer] FCE Physical Work Animal nutritionist.  This evaluation is substantiated by reliability and validity research conducted at the [place] [facility] [place]  at [facility] and reported in the Journal of Occupational Medicine, September 1994  Overall Level of Work: Falls within the [employer] range.  Exerting up to 20 pounds of force occasionally, and/or up to 10 pounds of force frequently, and/or a negligible amount of force constantly (Constantly: activity or condition exist 2/3 or more of the time) to move objects.  Physical demand requirements are in excess of those for Sedentary Work.  Even though the weight lifted may be only a negligible amount, a job should be rated Light Work: (1) when it requires walking or standing to significant degree; or (2) when it requires sitting most of the time but entails pushing and/or pulling of arm or leg controls; and/or (3)  when the job requires working at a production rate pace entailing the constant pushing and/or pulling of materials even though the weight of those materials is negligible.  NOTE: The constant stress and strain of maintaining a production rate pace, especially in an industrial setting, can be and is physically demanding of a worker even though the amount of force exerted is negligible.  Please note that the dynamic strength/manual materials handling section of the report indicates a higher level of work than that determined by considering the client's performance on the entire test.  Our research shows that the safe, overall level of work is significantly influenced by non-materials handling (i.e. position tolerance and mobility) abilities.  To ignore these non-materials handling demands, negatively impacts the validity of the test.    Please see the Task Performance Table for specific abilities.  Tolerance for the 8-Hour Day: Due to the client's limited ability to walk and stand, she would have to alternate between standing, walking and other tasks as listed in the task performance table to be able to tolerate the Light level of work for the 8-hour day/40-hour week.     Self Limiting Behavior:   Client participated  fully in all tasks.  No self-limiting behavior noted.     Self Limiting < 20% of tasks = Within normal limits1  Self-Limiting 21% to 33% of tasks = Exceeds normal limits1  Self-Limiting > 33% of tasks = Significantly exceeds normal limits1 1When compared to a motivated group of patients who participated in research.  SUBJECTIVE PAIN STATEMENTS The client made the following subjective pain statements during the test:  Increase L hand pain with grasping box handles during floor to waist lifting and bilateral carry.    Marked R knee pain reported during squats/ lifting tasks.  Pt. wearing R knee brace for support.    Pt. c/o generalized pain throughout evaluation with all lifting/ position  tasks.  Pain score 6/10  These pain statements were consistent with the observed movement patterns.  PAIN BEHAVIORS AND THEIR IMPACT ON TEST RESULTS The client demonstrated the following pain behaviors during the test:  Loss of manual dexterity with repetitive nut/bolt assembly.    Unable to get in kneeling position secondary to R knee pain with flexion/ weight bearing.    Frequent position changes during standing tolerance/ overhead reaching tasks.  These pain behaviors were consistent with the observed movement patterns.  These pain behaviors correlated with the client's self-reported pain.  These pain behaviors did not affect test performance  FUNCTIONAL OUTCOME MEASURES Lower Extremity Functional Scale: 15 out of 80 Fear Avoidance Beliefs Questionnaire: (+) for being fearful of Physical Activity Modified Oswestry Low Back Pain Questionnaire: 78% self-perceived extreme disability QuickDASH Disability Score: 70.5%  BODY MECHANICS AND MOVEMENT PATTERNS The client demonstrates safe body mechanics and movement patterns during the test.    BRIEF SUMMARY OF MEDICAL HISTORY Officially diagnosed with EDS in 2016.  Had FCE in November of 2016 for work restrictions (Overall level of work: Marine scientist).  Pt. states she is physically 20% of what she was.  8/10 pain "all over body", "back has been issue for 10+ years".   Pt. has brain freeze for past 3 years and has difficulty with concentration/ focus.   Pt. has not worked in past 2 years due to limitations with R CTS (was working 60-80 hours/week)- 03/2021 has been written out on short-term disability and then long-term disability in 2023.    Has had numerous falls over past several years with tear in R lateral knee (complex meniscus tear).   Pelvic floor therapy recommended.   Taking over 100 supplements over 3x/day.  Discharged and taking 20 meds/ day.    No income.   Pt. has hearing loss and auditory processing disorder.    Pt. reports a very  difficult living situation and may not get a bath for a couple weeks.  Lives in [place] in 2nd story condo.  12 stairs with handrail with step to pattern.  Pt. has a truck but has difficulty getting into truck and pt. is not driving currently.  Pt. reports difficulty seeing the color red.  MEDICATIONS  Medication See scanned list    UPPER EXTREMITY FUNCTION Client has a diagnosis involving the upper extremities.  Ability to tolerate:  Handling Frequently  Fingering Frequently  Reaching at Waist Frequently  Reaching below Waist Frequently Client's right hand is dominant.  BRIEF MUSCULOSKELETAL SCREEN  Cervical AROM WNL (discomfort and prefers holding stretch).  Lumbar spine WNL (hypermobile).  B shoulder AROM (WNL)- all planes.   B knee AROM WNL (R knee brace)- h/o knee subluxation without use of brace.  No LLD.  Able to complete SLR  and bridging with good technique.  See MMT. TEST LENGTH AND REST BREAKS The test lasted 1:53 hours.  Short pauses of 2-3 minutes between tasks occur while the evaluator is setting up equipment and documenting scores.  No additional rest breaks were taken.   TASK PERFORMANCE   Tasks Client Performance 1 Floor to waist lift 32 lb Occas. Waist to eye level lift 25 lb Occas. Two handed carrying 35 lb Occas. Pushing 25 lb Occas. 3 Pulling 25 lb Occas. 3 Sitting Frequently  Standing Occasionally  Work arms over head-standing Occasionally Work kneeling Unable Work squatting/crouching Occasionally Climbing stairs  Occasionally Repetitive squatting Occasionally Walking Occasionally  Balance on level surfaces Adequate Balance on uneven surfaces Adequate Balance on beam/scaffold Inadequate Forward Reaching Frequently Grip Strength L80  R74 lb  1 Occasionally = up to 1/3 of the day, Frequently = 1/3 to 2/3 of the day, Constantly = 2/3 to the full day.  Frequent lifting = 50% of Occasional; Constant lifting = 20%  of Occasional. 2 D.O.T. The aptitudes:   1 (90-100 percentile), 2 (67-89 percentile), 3 (34-66 percentile), 4 (11-33 percentile), 5 (0-10 percentile). 3 Pounds of force is the amount of force the client exerted during the pushing and pulling tasks.  If pushing or pulling is required for work, the force required for the task should be measured with a force gauge for comparison.   MAJOR AREAS OF DYSFUNCTION  Mobility  Manual Dexterity  FACTORS UNDERLYING PERFORMANCE  Decreased muscle strength in R LE/ B shoulders  Generalized de-conditioning  Pain in low back/ R knee/ "generalized"  Generalized fatigue  EXIT INTERVIEW  There were no changes in musculoskeletal status from beginning to end.  Pain Score: 6  Gait pattern leaving the evaluation is similar as compared to gait pattern used upon arriving for test.  The client was driven to the evaluation.  Pt. reports h/o chest pain due to EDS and mast cell.    Increase shortness of breath with prolonged walking/ activity.      OBJECTIVE IMPAIRMENTS: decreased activity tolerance, decreased endurance, decreased mobility, decreased strength, and pain.   ACTIVITY LIMITATIONS: carrying, lifting, bending, standing, squatting, stairs, and locomotion level  PARTICIPATION LIMITATIONS: cleaning, laundry, driving, community activity, and occupation  PERSONAL FACTORS: Fitness and Past/current experiences are also affecting patient's functional outcome.   REHAB POTENTIAL: Fair chronic  CLINICAL DECISION MAKING: Evolving/moderate complexity  EVALUATION COMPLEXITY: High   PLAN:  PT FREQUENCY: one time visit  PLANNED INTERVENTIONS: 97750- Physical Performance Testing, 97110-Therapeutic exercises, 97530- Therapeutic activity, [id number]- Neuromuscular re-education, and 16109- Self Care  PLAN FOR NEXT SESSION: FCE only   Lendell Quarry, PT, DPT # [phone] 08/22/2023, 8:42 PM

## 2023-08-24 ENCOUNTER — Other Ambulatory Visit (HOSPITAL_BASED_OUTPATIENT_CLINIC_OR_DEPARTMENT_OTHER): Payer: Self-pay

## 2023-08-25 DIAGNOSIS — D8941 Monoclonal mast cell activation syndrome: Secondary | ICD-10-CM | POA: Insufficient documentation

## 2023-08-25 DIAGNOSIS — S46011D Strain of muscle(s) and tendon(s) of the rotator cuff of right shoulder, subsequent encounter: Secondary | ICD-10-CM | POA: Insufficient documentation

## 2023-08-28 ENCOUNTER — Ambulatory Visit
Admission: RE | Admit: 2023-08-28 | Discharge: 2023-08-28 | Disposition: A | Discharge status: Home / Self Care | Source: Ambulatory Visit | Attending: Obstetrics and Gynecology | Admitting: Obstetrics and Gynecology

## 2023-08-28 ENCOUNTER — Other Ambulatory Visit: Payer: Self-pay | Admitting: Obstetrics and Gynecology

## 2023-08-28 DIAGNOSIS — R921 Mammographic calcification found on diagnostic imaging of breast: Secondary | ICD-10-CM

## 2023-08-29 ENCOUNTER — Other Ambulatory Visit: Payer: Self-pay

## 2023-08-30 ENCOUNTER — Other Ambulatory Visit (HOSPITAL_BASED_OUTPATIENT_CLINIC_OR_DEPARTMENT_OTHER): Payer: Self-pay

## 2023-08-30 ENCOUNTER — Encounter: Payer: Self-pay | Admitting: Physical Therapy

## 2023-08-30 ENCOUNTER — Telehealth: Payer: Self-pay

## 2023-08-30 ENCOUNTER — Other Ambulatory Visit (HOSPITAL_COMMUNITY): Payer: Self-pay

## 2023-08-30 MED ORDER — CLONAZEPAM 0.5 MG PO TABS
0.5000 mg | ORAL_TABLET | Freq: Every evening | ORAL | 0 refills | Status: DC | PRN
  Filled 2023-08-30: qty 30, 30d supply, fill #0

## 2023-08-30 NOTE — Telephone Encounter (Signed)
*  Asthma/Allergy   Pharmacy Patient Advocate Encounter   Received notification from CoverMyMeds that prior authorization for Fexofenadine  HCl 180MG  tablets  is required/requested.   Insurance verification completed.   The patient is insured through [facility] .   Per test claim: PA required; PA submitted to above mentioned insurance via CoverMyMeds Key/confirmation #/EOC [id number] Status is pending

## 2023-08-31 ENCOUNTER — Other Ambulatory Visit (HOSPITAL_BASED_OUTPATIENT_CLINIC_OR_DEPARTMENT_OTHER): Payer: Self-pay

## 2023-08-31 ENCOUNTER — Other Ambulatory Visit: Payer: Self-pay

## 2023-08-31 MED ORDER — TRAMADOL HCL 50 MG PO TABS
100.0000 mg | ORAL_TABLET | Freq: Three times a day (TID) | ORAL | 0 refills | Status: DC | PRN
  Filled 2023-08-31: qty 30, 5d supply, fill #0
  Filled 2023-08-31: qty 180, 30d supply, fill #0
  Filled 2023-09-25: qty 30, 5d supply, fill #0
  Filled 2023-10-23: qty 30, 5d supply, fill #1
  Filled 2023-11-01: qty 30, 5d supply, fill #2
  Filled 2023-11-09: qty 30, 5d supply, fill #3
  Filled 2023-11-21 – 2023-11-22 (×2): qty 30, 5d supply, fill #4
  Filled 2023-12-01: qty 30, 5d supply, fill #5

## 2023-08-31 NOTE — Telephone Encounter (Signed)
 Resubmitting for 30 day supply under CMM Key: [id number]

## 2023-08-31 NOTE — Telephone Encounter (Signed)
 Per plan: The member recently filled this medication and will be able to return for their next refill according to their plan limits.

## 2023-09-01 ENCOUNTER — Other Ambulatory Visit (HOSPITAL_BASED_OUTPATIENT_CLINIC_OR_DEPARTMENT_OTHER): Payer: Self-pay

## 2023-09-05 ENCOUNTER — Other Ambulatory Visit (HOSPITAL_BASED_OUTPATIENT_CLINIC_OR_DEPARTMENT_OTHER): Payer: Self-pay

## 2023-09-08 ENCOUNTER — Encounter (HOSPITAL_BASED_OUTPATIENT_CLINIC_OR_DEPARTMENT_OTHER): Payer: Self-pay | Admitting: Family Medicine

## 2023-09-08 ENCOUNTER — Ambulatory Visit (HOSPITAL_BASED_OUTPATIENT_CLINIC_OR_DEPARTMENT_OTHER): Admitting: Family Medicine

## 2023-09-08 VITALS — BP 124/72 | HR 97 | Ht 66.0 in | Wt 146.7 lb

## 2023-09-08 DIAGNOSIS — F32A Depression, unspecified: Secondary | ICD-10-CM | POA: Diagnosis not present

## 2023-09-08 DIAGNOSIS — G894 Chronic pain syndrome: Secondary | ICD-10-CM | POA: Diagnosis not present

## 2023-09-08 DIAGNOSIS — G4701 Insomnia due to medical condition: Secondary | ICD-10-CM | POA: Diagnosis not present

## 2023-09-08 DIAGNOSIS — L700 Acne vulgaris: Secondary | ICD-10-CM | POA: Insufficient documentation

## 2023-09-08 DIAGNOSIS — D045 Carcinoma in situ of skin of trunk: Secondary | ICD-10-CM | POA: Insufficient documentation

## 2023-09-08 DIAGNOSIS — G90A Postural orthostatic tachycardia syndrome (POTS): Secondary | ICD-10-CM

## 2023-09-08 DIAGNOSIS — M359 Systemic involvement of connective tissue, unspecified: Secondary | ICD-10-CM | POA: Insufficient documentation

## 2023-09-08 DIAGNOSIS — J309 Allergic rhinitis, unspecified: Secondary | ICD-10-CM | POA: Insufficient documentation

## 2023-09-08 DIAGNOSIS — L814 Other melanin hyperpigmentation: Secondary | ICD-10-CM | POA: Insufficient documentation

## 2023-09-08 HISTORY — DX: Systemic involvement of connective tissue, unspecified: M35.9

## 2023-09-08 NOTE — Assessment & Plan Note (Signed)
 Given underlying depression, PTSD, insomnia, anxiety, patient is requesting to establish with a psychiatrist locally which I feel would be appropriate given diagnoses, comorbidities, medications utilized.  She does have appointment to establish with new therapist as well.  In the meantime, can continue with current medication regimen, we can provide refills as needed until she is able to establish with psychiatrist for further evaluation and medication recommendations.  Referral placed today

## 2023-09-08 NOTE — Assessment & Plan Note (Signed)
-  As above.

## 2023-09-08 NOTE — Assessment & Plan Note (Signed)
 Patient has been working with physical therapy regarding management of intermittent symptoms, balance.  She wonders about establishing with physical therapy office locally which may be able to provide similar services.  I do feel that most appropriate PT office regarding this would be with neurorehab locally, referral has been placed

## 2023-09-08 NOTE — Patient Instructions (Signed)
  Medication Instructions:  Your physician recommends that you continue on your current medications as directed. Please refer to the Current Medication list given to you today. --If you need a refill on any your medications before your next appointment, please call your pharmacy first. If no refills are authorized on file call the office.--   Referrals/Procedures/Imaging: pschiatry  Follow-Up: Your next appointment:   Your physician recommends that you schedule a follow-up appointment in: 4 month follow up  with Dr. [name]  You will receive a text message or e-mail with a link to a survey about your care and experience with us  today! We would greatly appreciate your feedback!   Thanks for letting us  be apart of your health journey!!  Primary Care and Sports Medicine   Dr. Court Distance Peru   We encourage you to activate your patient portal called "MyChart".  Sign up information is provided on this After Visit Summary.  MyChart is used to connect with patients for Virtual Visits (Telemedicine).  Patients are able to view lab/test results, encounter notes, upcoming appointments, etc.  Non-urgent messages can be sent to your provider as well. To learn more about what you can do with MyChart, please visit --  [email or web address].

## 2023-09-08 NOTE — Progress Notes (Signed)
 New Patient Office Visit  Subjective   Patient ID: Lindsey [name], female    DOB: Nov 07, 1965  Age: 58 y.o. MRN: [id number]  CC:  Chief Complaint  Patient presents with   New Patient (Initial Visit)    New patient was at [facility] needs sports med provider not getting medication timely clonazepam  if she does not get this she can't get any sleep also in PT for injuries she does call early to get medication and they will not send it in     HPI [name] presents to establish care Last PCP - Lindsey [name] with [facility] Patient with extensive medical history  Primary concern today is related to her benzodiazepine - she reports that her last PCP would not provide refills on benzodiazepine and that she would request refill a few days ahead of time, but still would have difficulty with receiving medication in timely manner.  Indicates that this placed increased stress and burden on her as she may have to go short periods of time without medication She reports history of PTSD, insomnia, depression, anxiety Currently scheduled to establish with therapist on [facility] She is wanting to establish with psychiatry locally. No recent psychiatry evaluation.  She does follow with pain management - concerned that she is being prescribed tramadol  and that pain management providers are not wanting to prescribe anything stronger for her pain.  She reports meniscal tear in right knee - is doing PT through [facility] and monitoring progress with potential consideration for surgery pending progress.  Reports chronic issues with balance and falls related to EDS and POTS.  She does follow with Allergist, [facility] for PT, GI with [facility]. Follows with oncology for MGUS. Has been referred to [facility].  Has permanent disability related to hand surgery in the past - continued pain with activities like typing. She reports diagnosis of central auditory  processing disorder.  Patient is originally from [place] .  Outpatient Encounter Medications as of 09/08/2023  Medication Sig   albuterol  (PROVENTIL ) (2.5 MG/3ML) 0.083% nebulizer solution Take 3 mLs (2.5 mg total) by nebulization every 6 (six) hours as needed.   albuterol  (VENTOLIN  HFA) 108 (90 Base) MCG/ACT inhaler Inhale 2 puffs into the lungs every 6 (six) hours as needed for wheezing or shortness of breath.   amitriptyline  (ELAVIL ) 25 MG tablet Take 1 tablet (25 mg total) by mouth at bedtime.   budesonide -formoterol  (SYMBICORT ) 80-4.5 MCG/ACT inhaler Use 2 puffs every 4 hours as needed, maximum 12 puffs per day and At onset of respiratory illness/asthma flare: Inhale 2 puffs twice daily with spacer for 2 weeks or until symptoms resolve.   clonazePAM  (KLONOPIN ) 0.5 MG tablet Take 1 tablet (0.5 mg total) by mouth at bedtime as needed for anxiety. Max daily amount: 0.5 mg   cycloSPORINE (RESTASIS) 0.05 % ophthalmic emulsion Place 1 drop into both eyes 2 (two) times daily.   EPINEPHrine  (EPIPEN  2-PAK) 0.3 mg/0.3 mL IJ SOAJ injection Inject 0.3 mg into the muscle as needed for anaphylaxis.   estradiol  (VIVELLE -DOT) 0.0375 MG/24HR Place 1 patch onto the skin twice a week.   estradiol  (VIVELLE -DOT) 0.1 MG/24HR patch Place 1 patch onto the skin 2 (two) times a week.   fexofenadine  (ALLEGRA ) 180 MG tablet Take 1 tablet (180 mg total) by mouth daily.   gabapentin  (NEURONTIN ) 300 MG capsule Take 2 capsules (600 mg total) by mouth at bedtime as needed.   Immune Globulin, Human, (HIZENTRA) 10 GM/50ML SOLN Inject  10 g into the skin once a week.   levocetirizine (XYZAL ) 5 MG tablet Take 1 tablet (5 mg total) by mouth daily.   linaclotide  (LINZESS ) 72 MCG capsule Take 1 capsule (72 mcg total) by mouth daily.   methocarbamol  (ROBAXIN ) 500 MG tablet Take 2 tablets (1,000 mg total) by mouth 2 (two) times daily as needed.   montelukast  (SINGULAIR ) 10 MG tablet Take 1 tablet (10 mg total) by mouth at bedtime.    progesterone  (PROMETRIUM ) 200 MG capsule Take 3 capsules (600 mg total) by mouth at bedtime.   sertraline  (ZOLOFT ) 50 MG tablet Take one half tablet (25 mg dose) by mouth daily for 6 days, THEN one tablet (50 mg dose) daily.   Sodium Fluoride  1.1 % PSTE use as directed   Testosterone  75 MG PLLT 87.mg every 3 months, insert 1 pellet every 3 months.   traMADol  (ULTRAM ) 50 MG tablet Take 1-2 tablets (50-100 mg total) by mouth every 8 (eight) hours as needed for pain.   traZODone  (DESYREL ) 100 MG tablet Take 1-2 tablets (100-200 mg total) by mouth at bedtime.   [DISCONTINUED] diclofenac  Sodium (VOLTAREN  ARTHRITIS PAIN) 1 % GEL Apply 2 g topically 4 (four) times daily.   [DISCONTINUED] gabapentin  (NEURONTIN ) 300 MG capsule Take 2 capsules (600 mg total) by mouth at bedtime.   [DISCONTINUED] predniSONE  (STERAPRED UNI-PAK 21 TAB) 5 MG (21) TBPK tablet Take 6 tabs on day 1, 5 tabs on day 2, 4 tabs on day 3, 3 tabs on day 4, 2 tabs on day 5, 1 tab on day 6. Then stop.   No facility-administered encounter medications on file as of 09/08/2023.    Past Medical History:  Diagnosis Date   Allergy     Angio-edema    Anxiety 09/18/2022   Arthritis    Asthma    Autoimmune disease (HCC) 09/08/2023   Cancer (HCC) 10/25/2021   Carpal tunnel syndrome 07/07/2015   right   Chronic cough 12/02/2015   Chronic pain syndrome 08/10/2016   CVID (common variable immunodeficiency) (HCC)    Depression 09/18/2022   Ehlers-Danlos syndrome    Essential alopecia of women 04/14/2011   Family history of breast cancer in female 08/26/2015   (x2) paternal 1st cousins dx in their 23s    Fibrocystic breast changes of both breasts 07/19/2015   Fibromyalgia    HLD (hyperlipidemia)    Hypermobility syndrome 11/26/2014   Based on Fam Hx and Beighton Score of 7 this is likely ED III    Hypertrophy of vulva 07/20/2017   Inflammatory dermatosis 04/14/2011   Irritable larynx syndrome 12/02/2015   Lumbar radiculopathy 02/16/2016    MGUS (monoclonal gammopathy of unknown significance)    Osteoarthritis 04/01/2017   Osteopenia    Osteoporosis 12/05/2017   Primary arthrosis of first carpometacarpal joints, bilateral 06/01/2015   Sacroiliac joint dysfunction of right side 11/26/2014   B SI joint xrays last done at [facility] Dr. Bascom Lily 03/13/2017   Skin cancer     Past Surgical History:  Procedure Laterality Date   BREAST EXCISIONAL BIOPSY Left 01/2019   atypical lobular hyperplasia   BREAST SURGERY  02/05/2019   Atypical Lobular Hyperplasia   CARPAL TUNNEL RELEASE Right 06/2020   COLONOSCOPY     DENTAL SURGERY     ELBOW FRACTURE SURGERY Left    as child   EYE SURGERY  2005   LASIK   FRACTURE SURGERY  1974   L elbow break   JOINT REPLACEMENT  07/07/2020  first metacarpal joint R hand   RADIOACTIVE SEED GUIDED EXCISIONAL BREAST BIOPSY Left 02/05/2019   Procedure: RADIOACTIVE SEED GUIDED EXCISIONAL LEFT BREAST BIOPSY;  Surgeon: Enid Harry, MD;  Location: [facility];  Service: General;  Laterality: Left;   SPINE SURGERY  2019   RFA, Miki Alert; [facility]    Family History  Problem Relation Age of Onset   Hypertension Mother    Other Mother        hx of hysterectomy at 17-56 for prolapsed uterus; hx of benign L arm cyst in her 70s   Arthritis Mother    Hyperlipidemia Mother    Stroke Father    Hypertension Father    Skin cancer Father 81       NOS   Other Father        enlarged prostate s/p surgery   Alcohol abuse Father    Arthritis Father    Hyperlipidemia Father    Alzheimer's disease Maternal Aunt    Stomach cancer Paternal Aunt        d. 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 75-76   Cancer Maternal Grandfather    Heart Problems Paternal Grandmother    Heart  Problems Paternal Grandfather    Colon cancer Other        maternal great aunt (MGM's sister) dx in her 56s   Heart attack Paternal Uncle 44   Heart Problems Paternal Uncle    Angina Paternal Uncle    Breast cancer Cousin        paternal 1st cousin d. 41   Breast cancer Cousin        paternal 1st cousin dx. 50s   Colon polyps Neg Hx    Esophageal cancer Neg Hx    Rectal cancer Neg Hx     Social History   Socioeconomic History   Marital status: Single    Spouse name: n/a   Number of children: 0   Years of education: Not on file   Highest education level: Bachelor's degree (e.g., BA, AB, BS)  Occupational History   Occupation: Pharmacologist    Comment: [employer]  Tobacco Use   Smoking status: Never    Passive exposure: Never   Smokeless tobacco: Never   Tobacco comments:    No history; N/A  Vaping Use   Vaping status: Never Used  Substance and Sexual Activity   Alcohol use: No   Drug use: No   Sexual activity: Not Currently    Birth control/protection: Abstinence, Post-menopausal  Other Topics Concern   Not on file  Social History Narrative   Lives with alone   R handed   Caffeine: 1-2 C of coffee in the AM.   Social Drivers of Health   Financial Resource Strain: High Risk (09/04/2023)   Overall Financial Resource Strain (CARDIA)    Difficulty of Paying Living Expenses: Very hard  Food Insecurity: Food Insecurity Present (09/04/2023)   Hunger Vital Sign    Worried About Running Out of Food in the Last Year: Often true    Ran Out of Food in the Last Year: Often true  Transportation Needs: Unmet Transportation Needs (09/04/2023)   PRAPARE - Administrator, Civil Service (Medical): Yes  Lack of Transportation (Non-Medical): Yes  Physical Activity: Unknown (09/04/2023)   Exercise Vital Sign    Days of Exercise per Week: 0 days    Minutes of Exercise per Session: Not on file  Stress: Stress Concern Present (09/04/2023)   [employer] of [employer] - Occupational Stress Questionnaire    Feeling of Stress : Very much  Social Connections: Socially Isolated (09/04/2023)   Social Connection and Isolation Panel [NHANES]    Frequency of Communication with Friends and Family: Once a week    Frequency of Social Gatherings with Friends and Family: Never    Attends Religious Services: Never    Database administrator or Organizations: No    Attends Engineer, structural: Not on file    Marital Status: Never married  Intimate Partner Violence: At Risk (08/18/2023)   Received from [facility]   HITS    Over the last 12 months how often did your partner physically hurt you?: Frequently    Over the last 12 months how often did your partner insult you or talk down to you?: Frequently    Over the last 12 months how often did your partner threaten you with physical harm?: Frequently    Over the last 12 months how often did your partner scream or curse at you?: Frequently    Objective   BP 124/72 (BP Location: Right Arm, Patient Position: Sitting, Cuff Size: Normal)   Pulse 97   Ht 5\' 6"  (1.676 m)   Wt 146 lb 11.2 oz (66.5 kg)   LMP 04/08/2014   SpO2 100%   BMI 23.68 kg/m   Physical Exam  58 year old female in no acute distress Cardiovascular exam with regular rate and rhythm Lungs clear to auscultation bilaterally  Assessment & Plan:   Depression, unspecified depression type Assessment & Plan: Given underlying depression, PTSD, insomnia, anxiety, patient is requesting to establish with a psychiatrist locally which I feel would be appropriate given diagnoses, comorbidities, medications utilized.  She does have appointment to establish with new therapist as well.  In the meantime, can continue with current medication regimen, we can provide refills as needed until she is able to establish with psychiatrist for further evaluation and medication recommendations.  Referral placed today  Orders: -      Ambulatory referral to Psychiatry  Insomnia due to medical condition Assessment & Plan: As above  Orders: -     Ambulatory referral to Psychiatry  Chronic pain syndrome Assessment & Plan: Recommend continued follow-up with pain management specialist regarding medication management  Orders: -     Ambulatory referral to Psychiatry  Postural orthostatic tachycardia syndrome (POTS) Assessment & Plan: Patient has been working with physical therapy regarding management of intermittent symptoms, balance.  She wonders about establishing with physical therapy office locally which may be able to provide similar services.  I do feel that most appropriate PT office regarding this would be with neurorehab locally, referral has been placed  Orders: -     Ambulatory referral to Physical Therapy  Return in about 4 months (around 01/09/2024) for med check.   Spent 58 minutes on this patient encounter, including preparation, chart review, face-to-face counseling with patient and coordination of care, and documentation of encounter. Patient with extensive medical history, extensive medical records review required.   ___________________________________________ Leeasia Secrist [name], MD, ABFM, CAQSM Primary Care and Sports Medicine [facility]

## 2023-09-08 NOTE — Assessment & Plan Note (Signed)
 Recommend continued follow-up with pain management specialist regarding medication management

## 2023-09-11 ENCOUNTER — Ambulatory Visit: Admitting: Internal Medicine

## 2023-09-13 ENCOUNTER — Other Ambulatory Visit (HOSPITAL_BASED_OUTPATIENT_CLINIC_OR_DEPARTMENT_OTHER): Payer: Self-pay

## 2023-09-22 ENCOUNTER — Other Ambulatory Visit: Payer: Self-pay

## 2023-09-22 ENCOUNTER — Other Ambulatory Visit (HOSPITAL_COMMUNITY): Payer: Self-pay

## 2023-09-22 ENCOUNTER — Encounter (HOSPITAL_BASED_OUTPATIENT_CLINIC_OR_DEPARTMENT_OTHER): Payer: Self-pay | Admitting: Family Medicine

## 2023-09-25 ENCOUNTER — Encounter (HOSPITAL_BASED_OUTPATIENT_CLINIC_OR_DEPARTMENT_OTHER): Payer: Self-pay | Admitting: Family Medicine

## 2023-09-25 ENCOUNTER — Other Ambulatory Visit (HOSPITAL_BASED_OUTPATIENT_CLINIC_OR_DEPARTMENT_OTHER): Payer: Self-pay

## 2023-09-26 ENCOUNTER — Other Ambulatory Visit (HOSPITAL_BASED_OUTPATIENT_CLINIC_OR_DEPARTMENT_OTHER): Payer: Self-pay

## 2023-09-26 ENCOUNTER — Ambulatory Visit: Attending: Family Medicine | Admitting: Physical Therapy

## 2023-09-26 MED ORDER — CLONAZEPAM 0.5 MG PO TABS
0.5000 mg | ORAL_TABLET | Freq: Every evening | ORAL | 5 refills | Status: AC | PRN
  Filled 2023-09-26 – 2023-09-30 (×2): qty 30, 30d supply, fill #0
  Filled 2023-10-27 – 2023-10-30 (×3): qty 30, 30d supply, fill #1
  Filled 2023-11-27: qty 30, 30d supply, fill #2
  Filled 2023-12-29: qty 30, 30d supply, fill #3
  Filled 2024-01-29: qty 30, 30d supply, fill #4

## 2023-09-26 NOTE — Therapy (Incomplete)
 OUTPATIENT PHYSICAL THERAPY NEURO EVALUATION   Patient Name: Lindsey [name] MRN: [id number] DOB:[birth year], 58 y.o., female Today's Date: 09/26/2023   PCP: Eddie Good [place], Alonza Jansky, MD REFERRING PROVIDER: [name], Raymond J, MD  END OF SESSION:   Past Medical History:  Diagnosis Date   Allergy     Angio-edema    Anxiety 09/18/2022   Arthritis    Asthma    Autoimmune disease (HCC) 09/08/2023   Cancer (HCC) 10/25/2021   Carpal tunnel syndrome 07/07/2015   right   Chronic cough 12/02/2015   Chronic pain syndrome 08/10/2016   CVID (common variable immunodeficiency) (HCC)    Depression 09/18/2022   Ehlers-Danlos syndrome    Essential alopecia of women 04/14/2011   Family history of breast cancer in female 08/26/2015   (x2) paternal 1st cousins dx in their 67s    Fibrocystic breast changes of both breasts 07/19/2015   Fibromyalgia    HLD (hyperlipidemia)    Hypermobility syndrome 11/26/2014   Based on Fam Hx and Beighton Score of 7 this is likely ED III    Hypertrophy of vulva 07/20/2017   Inflammatory dermatosis 04/14/2011   Irritable larynx syndrome 12/02/2015   Lumbar radiculopathy 02/16/2016   MGUS (monoclonal gammopathy of unknown significance)    Osteoarthritis 04/01/2017   Osteopenia    Osteoporosis 12/05/2017   Primary arthrosis of first carpometacarpal joints, bilateral 06/01/2015   Sacroiliac joint dysfunction of right side 11/26/2014   B SI joint xrays last done at [facility] Dr. Bascom Lily 03/13/2017   Skin cancer    Past Surgical History:  Procedure Laterality Date   BREAST EXCISIONAL BIOPSY Left 01/2019   atypical lobular hyperplasia   BREAST SURGERY  02/05/2019   Atypical Lobular Hyperplasia   CARPAL TUNNEL RELEASE Right 06/2020   COLONOSCOPY     DENTAL SURGERY     ELBOW FRACTURE SURGERY Left    as child   EYE SURGERY  2005   LASIK   FRACTURE SURGERY  1974   L elbow break   JOINT REPLACEMENT  07/07/2020   first metacarpal joint R hand    RADIOACTIVE SEED GUIDED EXCISIONAL BREAST BIOPSY Left 02/05/2019   Procedure: RADIOACTIVE SEED GUIDED EXCISIONAL LEFT BREAST BIOPSY;  Surgeon: Enid Harry, MD;  Location: [facility];  Service: General;  Laterality: Left;   SPINE SURGERY  2019   RFA, Miki Alert; [facility]   Patient Active Problem List   Diagnosis Date Noted   Acne vulgaris 09/08/2023   Allergic shiners 09/08/2023   Autoimmune disease (HCC) 09/08/2023   Disorder of connective tissue (HCC) 09/08/2023   Lentigo 09/08/2023   Squamous cell carcinoma in situ of skin of abdomen 09/08/2023   Monoclonal MCAS (HCC) 08/25/2023   Strain of muscle(s) and tendon(s) of the rotator cuff of right shoulder, subsequent encounter 08/25/2023   Pain in joint of right shoulder 07/07/2023   Tear of lateral meniscus of knee 07/07/2023   Other allergic rhinitis 06/05/2023   Moderate persistent asthma, uncomplicated 06/05/2023   Assistance needed with transportation 02/13/2023   Impaired mobility and ADLs 02/13/2023   Dyssynergic defecation 12/13/2022   Postural dizziness with presyncope 11/05/2022   Auditory processing disorder 09/22/2022   Anxiety 09/18/2022   Depression 09/18/2022   Panic attack due to post traumatic stress disorder (PTSD) 09/18/2022   History of penicillin allergy  08/29/2022   Muscle weakness (generalized) 06/30/2022   Arthralgia of right knee 06/14/2022   At high risk for breast cancer 04/19/2022   Allergic  rhinitis due to pollen 04/18/2022   Chronic fatigue syndrome 02/03/2022   Common variable immunodeficiency, unspecified (HCC) 12/02/2021   Postural orthostatic tachycardia syndrome (POTS) 11/25/2021   Vasovagal syncope 11/25/2021   MGUS (monoclonal gammopathy of unknown significance) 10/25/2021   Smoldering multiple myeloma (SMM) 10/25/2021   Acquired primary hypogammaglobulinemia (HCC) 09/10/2021   Carpal tunnel syndrome of right wrist 03/11/2021   Encounter for orthopedic  follow-up care 07/07/2020   Pain in right hand 06/03/2020   Gluten intolerance 04/11/2020   Non-restorative sleep 01/14/2020   Menopausal and female climacteric states 01/14/2020   Insomnia due to medical condition 01/14/2020   Asthma 06/18/2019   Migraine 06/18/2019   Osteopenia of multiple sites 06/18/2019   Chronic right-sided low back pain 02/22/2018   Hypertrophy of vulva 07/20/2017   Osteoarthritis 04/01/2017   Fibromyalgia 08/10/2016   Chronic pain syndrome 08/10/2016   Lumbar radiculopathy 02/16/2016   Hearing impairment 12/04/2015   Chronic cough 12/02/2015   Irritable larynx syndrome 12/02/2015   Genetic testing 09/21/2015   Family history of breast cancer in female 08/26/2015   Fibrocystic breast changes of both breasts 07/19/2015   Carpal tunnel syndrome 07/07/2015   Primary arthrosis of first carpometacarpal joints, bilateral 06/01/2015   Ehlers-Danlos syndrome 05/11/2015   Sacroiliac joint dysfunction of right side 11/26/2014   Hypermobility syndrome 11/26/2014   Essential alopecia of women 04/14/2011   Inflammatory dermatosis 04/14/2011   Post-traumatic stress disorder, chronic 05/01/2007   Adjustment disorder with mixed anxiety and depressed mood 12/01/2006   Irritable bowel syndrome with both constipation and diarrhea 04/11/1998    ONSET DATE: 09/08/2023 (MD referral)  REFERRING DIAG: G90.A (ICD-10-CM) - Postural orthostatic tachycardia syndrome (POTS)   THERAPY DIAG:  No diagnosis found.  Rationale for Evaluation and Treatment: Rehabilitation  SUBJECTIVE:                                                                                                                                                                                             SUBJECTIVE STATEMENT: *** Pt accompanied by: {accompnied:27141}  PERTINENT HISTORY: depression, PTSD, insomnia, anxiety, EDS, POTS, hx of falls  PAIN:  Are you having pain? {OPRCPAIN:27236}  PRECAUTIONS:  {Therapy precautions:24002}  RED FLAGS: {PT Red Flags:29287}   WEIGHT BEARING RESTRICTIONS: {Yes ***/No:24003}  FALLS: Has patient fallen in last 6 months? {fallsyesno:27318}  LIVING ENVIRONMENT: Lives with: {OPRC lives with:25569::lives with their family} Lives in: {Lives in:25570} Stairs: {opstairs:27293} Has following equipment at home: {Assistive devices:23999}  PLOF: {PLOF:24004}  PATIENT GOALS: ***  OBJECTIVE:  Note: Objective measures were completed at Evaluation unless otherwise noted.  DIAGNOSTIC FINDINGS: ***  COGNITION: Overall  cognitive status: {cognition:24006}   SENSATION: {sensation:27233}  COORDINATION: ***  EDEMA:  {edema:24020}  MUSCLE TONE: {LE tone:25568}  MUSCLE LENGTH: Hamstrings: Right *** deg; Left *** deg Andy Bannister test: Right *** deg; Left *** deg  DTRs:  {DTR SITE:24025}  POSTURE: {posture:25561}  LOWER EXTREMITY ROM:     {AROM/PROM:27142}  Right Eval Left Eval  Hip flexion    Hip extension    Hip abduction    Hip adduction    Hip internal rotation    Hip external rotation    Knee flexion    Knee extension    Ankle dorsiflexion    Ankle plantarflexion    Ankle inversion    Ankle eversion     (Blank rows = not tested)  LOWER EXTREMITY MMT:    MMT Right Eval Left Eval  Hip flexion    Hip extension    Hip abduction    Hip adduction    Hip internal rotation    Hip external rotation    Knee flexion    Knee extension    Ankle dorsiflexion    Ankle plantarflexion    Ankle inversion    Ankle eversion    (Blank rows = not tested)  BED MOBILITY:  {bed mobility:32615:p}  TRANSFERS: {transfers eval:32620}  RAMP:  {ramp eval:32616}  CURB:  {curb eval:32617}  STAIRS: {stairs eval:32618} GAIT: Findings: {GaitneuroPT:32644::Distance walked: ***,Comments: ***}  FUNCTIONAL TESTS:  {Functional tests:24029}  PATIENT SURVEYS:  {rehab surveys:24030}                                                                                                                               TREATMENT DATE: 09/26/2023    PATIENT EDUCATION: Education details: Eval results, POC*** Person educated: {Person educated:25204} Education method: {Education Method:25205} Education comprehension: {Education Comprehension:25206}  HOME EXERCISE PROGRAM: ***  GOALS: Goals reviewed with patient? Yes  SHORT TERM GOALS: Target date: ***  Pt will be *** with HEP for improved ***. Baseline: Goal status: INITIAL  2.  *** Baseline:  Goal status: INITIAL  3.  *** Baseline:  Goal status: INITIAL  4.  *** Baseline:  Goal status: INITIAL  5.  *** Baseline:  Goal status: INITIAL  6.  *** Baseline:  Goal status: INITIAL  LONG TERM GOALS: Target date: ***  Pt will be *** with HEP for improved ***. Baseline:  Goal status: INITIAL  2.  *** Baseline:  Goal status: INITIAL  3.  *** Baseline:  Goal status: INITIAL  4.  *** Baseline:  Goal status: INITIAL  5.  *** Baseline:  Goal status: INITIAL  6.  *** Baseline:  Goal status: INITIAL  ASSESSMENT:  CLINICAL IMPRESSION: Patient is a 58 y.o. female who was seen today for physical therapy evaluation and treatment for POTS. ***  OBJECTIVE IMPAIRMENTS: {opptimpairments:25111}.   ACTIVITY LIMITATIONS: {activitylimitations:27494}  PARTICIPATION LIMITATIONS: {participationrestrictions:25113}  PERSONAL FACTORS: {Personal factors:25162} are also affecting patient's functional outcome.   REHAB POTENTIAL: {rehabpotential:25112}  CLINICAL DECISION MAKING: {clinical decision making:25114}  EVALUATION COMPLEXITY: {Evaluation complexity:25115}  PLAN:  PT FREQUENCY: {rehab frequency:25116}  PT DURATION: {rehab duration:25117}  PLANNED INTERVENTIONS: {rehab planned interventions:25118::97110-Therapeutic exercises,97530- Therapeutic [phone]- Neuromuscular re-education,97535- Self ZSWF,09323- Manual therapy}  PLAN FOR NEXT  SESSION: Kelsey Patricia., PT 09/26/2023, 7:50 AM  [facility] [address], [address] [place], [place] [address] Phone # [phone] Fax # [phone]

## 2023-09-27 ENCOUNTER — Other Ambulatory Visit (HOSPITAL_BASED_OUTPATIENT_CLINIC_OR_DEPARTMENT_OTHER): Payer: Self-pay

## 2023-09-28 ENCOUNTER — Other Ambulatory Visit (HOSPITAL_BASED_OUTPATIENT_CLINIC_OR_DEPARTMENT_OTHER): Payer: Self-pay

## 2023-09-29 ENCOUNTER — Telehealth (HOSPITAL_BASED_OUTPATIENT_CLINIC_OR_DEPARTMENT_OTHER): Payer: Self-pay | Admitting: *Deleted

## 2023-09-29 NOTE — Telephone Encounter (Signed)
 My chart msg sent to patient.

## 2023-09-29 NOTE — Telephone Encounter (Signed)
 Copied from CRM [id number]. Topic: General - Call Back - No Documentation >> Sep 28, 2023  4:47 PM DeAngela L wrote: Reason for CRM: patient calling back to speak with [facility] Dr Lurene Salm nurse after she left her a vm for the patient, Pt says it's ok to leave a detailed message on her VM she doesn't want to miss the info and also the method of sending messages in [place] works well also Preferred method for her

## 2023-09-30 ENCOUNTER — Other Ambulatory Visit (HOSPITAL_BASED_OUTPATIENT_CLINIC_OR_DEPARTMENT_OTHER): Payer: Self-pay

## 2023-10-02 ENCOUNTER — Encounter (HOSPITAL_BASED_OUTPATIENT_CLINIC_OR_DEPARTMENT_OTHER): Payer: Self-pay | Admitting: Physical Therapy

## 2023-10-02 ENCOUNTER — Other Ambulatory Visit (HOSPITAL_BASED_OUTPATIENT_CLINIC_OR_DEPARTMENT_OTHER): Payer: Self-pay

## 2023-10-02 ENCOUNTER — Encounter: Payer: Self-pay | Admitting: Internal Medicine

## 2023-10-02 ENCOUNTER — Ambulatory Visit: Admitting: Internal Medicine

## 2023-10-02 ENCOUNTER — Other Ambulatory Visit: Payer: Self-pay

## 2023-10-02 ENCOUNTER — Other Ambulatory Visit (HOSPITAL_BASED_OUTPATIENT_CLINIC_OR_DEPARTMENT_OTHER): Payer: Self-pay | Admitting: *Deleted

## 2023-10-02 ENCOUNTER — Ambulatory Visit (HOSPITAL_BASED_OUTPATIENT_CLINIC_OR_DEPARTMENT_OTHER): Attending: Family Medicine | Admitting: Physical Therapy

## 2023-10-02 ENCOUNTER — Encounter (HOSPITAL_BASED_OUTPATIENT_CLINIC_OR_DEPARTMENT_OTHER): Payer: Self-pay | Admitting: Family Medicine

## 2023-10-02 VITALS — BP 116/74 | HR 82 | Temp 98.3°F | Wt 147.4 lb

## 2023-10-02 DIAGNOSIS — R42 Dizziness and giddiness: Secondary | ICD-10-CM | POA: Insufficient documentation

## 2023-10-02 DIAGNOSIS — G8929 Other chronic pain: Secondary | ICD-10-CM | POA: Diagnosis present

## 2023-10-02 DIAGNOSIS — M5416 Radiculopathy, lumbar region: Secondary | ICD-10-CM | POA: Insufficient documentation

## 2023-10-02 DIAGNOSIS — J3089 Other allergic rhinitis: Secondary | ICD-10-CM

## 2023-10-02 DIAGNOSIS — G90A Postural orthostatic tachycardia syndrome (POTS): Secondary | ICD-10-CM | POA: Insufficient documentation

## 2023-10-02 DIAGNOSIS — J4530 Mild persistent asthma, uncomplicated: Secondary | ICD-10-CM

## 2023-10-02 DIAGNOSIS — R55 Syncope and collapse: Secondary | ICD-10-CM | POA: Insufficient documentation

## 2023-10-02 DIAGNOSIS — D472 Monoclonal gammopathy: Secondary | ICD-10-CM | POA: Diagnosis not present

## 2023-10-02 DIAGNOSIS — D801 Nonfamilial hypogammaglobulinemia: Secondary | ICD-10-CM | POA: Diagnosis not present

## 2023-10-02 DIAGNOSIS — M6281 Muscle weakness (generalized): Secondary | ICD-10-CM | POA: Diagnosis present

## 2023-10-02 MED ORDER — BUDESONIDE-FORMOTEROL FUMARATE 80-4.5 MCG/ACT IN AERO
INHALATION_SPRAY | RESPIRATORY_TRACT | 12 refills | Status: AC
  Filled 2023-10-02 – 2024-04-02 (×2): qty 10.2, 30d supply, fill #0

## 2023-10-02 MED ORDER — LEVOCETIRIZINE DIHYDROCHLORIDE 5 MG PO TABS
5.0000 mg | ORAL_TABLET | Freq: Every day | ORAL | 5 refills | Status: DC
  Filled 2023-10-02: qty 30, 30d supply, fill #0
  Filled 2023-10-30: qty 30, 30d supply, fill #1
  Filled 2023-12-01: qty 30, 30d supply, fill #2
  Filled 2023-12-29 – 2024-01-15 (×2): qty 30, 30d supply, fill #3

## 2023-10-02 MED ORDER — CELECOXIB 100 MG PO CAPS
100.0000 mg | ORAL_CAPSULE | Freq: Two times a day (BID) | ORAL | 2 refills | Status: DC
  Filled 2023-10-02: qty 60, 30d supply, fill #0
  Filled 2023-10-30: qty 60, 30d supply, fill #1

## 2023-10-02 MED ORDER — POLYETHYLENE GLYCOL 3350 17 GM/SCOOP PO POWD
ORAL | 0 refills | Status: AC
  Filled 2023-10-02: qty 510, 30d supply, fill #0

## 2023-10-02 MED ORDER — FEXOFENADINE HCL 180 MG PO TABS
180.0000 mg | ORAL_TABLET | Freq: Every day | ORAL | 1 refills | Status: AC
  Filled 2023-10-02: qty 30, 30d supply, fill #0

## 2023-10-02 MED ORDER — MONTELUKAST SODIUM 10 MG PO TABS
10.0000 mg | ORAL_TABLET | Freq: Every day | ORAL | 1 refills | Status: DC
  Filled 2023-10-02 – 2023-12-01 (×2): qty 90, 90d supply, fill #0

## 2023-10-02 NOTE — Patient Instructions (Addendum)
 Allergic rhinitis with conjunctivitis Asthma Contact dermatitis Elevated IgA --> diagnosed and being monitored for smoldering myeloma Low IgG, IgM with poor vaccine response --> diagnosed with CVID now on IgG replacement therapy managed by Dr Tobie [facility]) Ehlers-Danlos syndrome, POTS  Symptoms not currently controlled (nasal congestion, drainage of ears)  -Continue daily Singulair  -Continue Allegra  and Xyzal  daily -Continue to have access to epinephrine  device.   Continue nasal saline rinses 1-2 times daily as needed.  -Have access to Symbicort  80 mcg 2 puffs every 4 hours as needed, max 12 puffs/day. Can also use 2 puffs prior to exercise if that cause symptoms. Or can use 1 vial of albuterol  every 4 hours via nebulizer. -environmental allergy  panel labs from 06/24/22 were negative.  Discussed skin testing for confirmation-if positive, okay for allergy  injections (current testing negative).  Recommend follow-up with ENT to discuss therapeutic options for ETD and ear drainage. .  -She will continue to follow with heme/onc for smoldering myeloma management -She will continue to follow with immunology for IgG replacement therapy  Follow-up for routine visit in about 6 months or sooner if needed.

## 2023-10-02 NOTE — Progress Notes (Signed)
 FOLLOW UP Date of Service/Encounter:   10/02/2023  Subjective:  Lindsey [name] (DOB: 09-Jun-1965) is a 58 y.o. female who [facility] in re-evaluation of the following: allergic rhinitis with conjunctivitis, asthma, contact dermatitis. CVID,  EDS/POTS, elevated IgA (diagnosed with smoldering myeloma)  History obtained from: chart review and patient.  For Review, LV was on 06/05/23  with Dr.Rajon Bisig seen for routine follow-up. See below for summary of history and diagnostics.   Therapeutic plans/changes recommended: Fev1 84%,asthma reported as doing well. Septoplasty and turbinate reduction did not help with drainage, did help with congestion. Not consistent with nasal sprays.  ----------------------------------------------------- Pertinent History/Diagnostics:  Allergic Rhinitis:  nasal congestion/drainage, sneezing, itchy eyes. She was on allergy  shots before from 2019- 2020 with us . Testing then was positive to pollens (trees and weeds  Current meds: xyzal , singulair   - Serum environmental panel (06/24/22): negative with total IgE 18 CVID Follows with Dr. Tobie, on gammunex monthly infusions Smoldering Myeloma:  Follows with heme/Onc Penicillin allergy :  Passed oral amoxicillin challenge on 10/28/22. --------------------------------------------------- Today presents for follow-up. Discussed the use of AI scribe software for clinical note transcription with the patient, who gave verbal consent to proceed.  History of Present Illness   Lindsey [name] is a 58 year old female with CVID who presents with persistent allergy  symptoms despite negative allergy  testing.  She experiences persistent rhinitis symptoms, including nasal congestion, ear drainage, and hoarseness. Congestion and drainage are particularly bothersome at night, affecting her sleep, and she notes visible drainage on her pillow form her ears. Sneezing fits occur, especially  when exposed to outdoor environments.  She has a history of CVID and was previously on Gamunex C, which improved her rhinitis symptoms. However, since switching to Hizentra about a year ago, her symptoms have returned. Her immunologist is planning to increase the dose of Hizentra due to inadequate coverage, as she feels unwell before her next dose is due.  Allergy  testing, including blood work in March, was negative for grass, trees, and other allergens. Her total IgE was not overwhelming (18).   She has a history of septoplasty and turbinate reduction, which did not alleviate her symptoms. An audiologist diagnosed her with progressive hearing loss and suggested ear tubes if allergy  treatments do not provide relief. She experiences fullness in her ears that is not relieved by nasal sprays.  She uses Singulair , Allegra , and Xyzal  for congestion and drainage, and saline washes twice a day for nasal dryness. She avoids Sudafed due to adverse effects like hyperactivity and increased heart rate.  She has a history of asthma, with triggers including heat, exercise. She uses a rescue inhaler as needed, particularly when exposed to these triggers but overall feels controlled.    Chart review: Most recent visit With Dr. Tobie  09/21/2023-plan to increase Hizentra 12 g weekly.  All medications reviewed by clinical staff and updated in chart. No new pertinent medical or surgical history except as noted in HPI.  ROS: All others negative except as noted per HPI.   Objective:  BP 116/74 (BP Location: Left Arm, Patient Position: Sitting, Cuff Size: Normal)   Pulse 82   Temp 98.3 F (36.8 C) (Temporal)   Wt 147 lb 6.4 oz (66.9 kg)   LMP 04/08/2014   SpO2 97%   BMI 23.79 kg/m  Body mass index is 23.79 kg/m. Physical Exam: General Appearance:  Alert, cooperative, no distress, appears stated age  Head:  Normocephalic, without obvious abnormality,  atraumatic  Eyes:  Conjunctiva clear, EOM's intact   Ears EACs normal bilaterally and normal TMs bilaterally  Nose: Nares normal, hypertrophic turbinates, normal mucosa, and no visible anterior polyps  Throat: Lips, tongue normal; teeth and gums normal, normal posterior oropharynx  Neck: Supple, symmetrical  Lungs:   clear to auscultation bilaterally, Respirations unlabored, no coughing  Heart:  regular rate and rhythm, Appears well perfused  Extremities: No edema  Skin: Skin color, texture, turgor normal and no rashes or lesions on visualized portions of skin  Neurologic: No gross deficits   Labs:  Lab Orders  No laboratory test(s) ordered today   Patient declines spirometry due to feeling controlled and desire to avoid coughing spell following test.  Assessment/Plan   She continues with nasal congestion and drainage from ears.   However discussed in detail that her last allergy  testing was entirely negative.  Recommended skin testing for confirmation, but without positive testing doubt allergy  injections would be helpful.  Discussed differential for chronic rhinitis including both nonallergic versus allergic types.  There seems to be some confusion regarding her CVID and whether or not she could receive allergy  injections.  Discussed that if her allergy  testing were positive there would be no contraindication to this, but negative testing is her limiting factor at this moment. She does report that her asthma is controlled, but declines spirometry at today's visit.  Will continue with current plan.  Allergic rhinitis with conjunctivitis Asthma Contact dermatitis Elevated IgA --> diagnosed and being monitored for smoldering myeloma Low IgG, IgM with poor vaccine response --> diagnosed with CVID now on IgG replacement therapy managed by Dr Tobie [facility]) Ehlers-Danlos syndrome, POTS  Symptoms not currently controlled (nasal congestion, drainage of ears)  -Continue daily Singulair  -Continue Allegra  and Xyzal  daily -Continue to have access  to epinephrine  device.   Continue nasal saline rinses 1-2 times daily as needed.  -Have access to Symbicort  80 mcg 2 puffs every 4 hours as needed, max 12 puffs/day. Can also use 2 puffs prior to exercise if that cause symptoms. Or can use 1 vial of albuterol  every 4 hours via nebulizer. -environmental allergy  panel labs from 06/24/22 were negative.  Discussed skin testing for confirmation-if positive, okay for allergy  injections (current testing negative).  Recommend follow-up with ENT to discuss therapeutic options for ETD and ear drainage. .   -She will continue to follow with heme/onc for smoldering myeloma management -She will continue to follow with immunology for IgG replacement therapy  Follow-up for routine visit in about 6 months or sooner if needed.  Other: none  Rocky Endow, MD  [facility] 

## 2023-10-02 NOTE — Therapy (Addendum)
 OUTPATIENT PHYSICAL THERAPY THORACOLUMBAR EVALUATION   Patient Name: Lindsey [name] MRN: [id number] DOB:07-06-1965, 58 y.o., female Today's Date: 10/02/2023  END OF SESSION:  PT End of Session - 10/02/23 1139     Visit Number 1    Number of Visits 12    Date for PT Re-Evaluation 12/01/23    Authorization Type mcaid    PT Start Time 1016    PT Stop Time 1058    PT Time Calculation (min) 42 min    Activity Tolerance Patient tolerated treatment well    Behavior During Therapy [facility] for tasks assessed/performed          Past Medical History:  Diagnosis Date   Allergy     Angio-edema    Anxiety 09/18/2022   Arthritis    Asthma    Autoimmune disease (HCC) 09/08/2023   Cancer (HCC) 10/25/2021   Carpal tunnel syndrome 07/07/2015   right   Chronic cough 12/02/2015   Chronic pain syndrome 08/10/2016   CVID (common variable immunodeficiency) (HCC)    Depression 09/18/2022   Ehlers-Danlos syndrome    Essential alopecia of women 04/14/2011   Family history of breast cancer in female 08/26/2015   (x2) paternal 1st cousins dx in their 17s    Fibrocystic breast changes of both breasts 07/19/2015   Fibromyalgia    HLD (hyperlipidemia)    Hypermobility syndrome 11/26/2014   Based on Fam Hx and Beighton Score of 7 this is likely ED III    Hypertrophy of vulva 07/20/2017   Inflammatory dermatosis 04/14/2011   Irritable larynx syndrome 12/02/2015   Lumbar radiculopathy 02/16/2016   MGUS (monoclonal gammopathy of unknown significance)    Osteoarthritis 04/01/2017   Osteopenia    Osteoporosis 12/05/2017   Primary arthrosis of first carpometacarpal joints, bilateral 06/01/2015   Sacroiliac joint dysfunction of right side 11/26/2014   B SI joint xrays last done at [facility]. Curt 03/13/2017   Skin cancer    Past Surgical History:  Procedure Laterality Date   BREAST EXCISIONAL BIOPSY Left 01/2019   atypical lobular hyperplasia   BREAST SURGERY  02/05/2019   Atypical  Lobular Hyperplasia   CARPAL TUNNEL RELEASE Right 06/2020   COLONOSCOPY     DENTAL SURGERY     ELBOW FRACTURE SURGERY Left    as child   EYE SURGERY  2005   LASIK   FRACTURE SURGERY  1974   L elbow break   JOINT REPLACEMENT  07/07/2020   first metacarpal joint R hand   RADIOACTIVE SEED GUIDED EXCISIONAL BREAST BIOPSY Left 02/05/2019   Procedure: RADIOACTIVE SEED GUIDED EXCISIONAL LEFT BREAST BIOPSY;  Surgeon: Ebbie Cough, MD;  Location: [facility];  Service: General;  Laterality: Left;   SPINE SURGERY  2019   RFA, [facility] Boor; [facility]   Patient Active Problem List   Diagnosis Date Noted   Acne vulgaris 09/08/2023   Allergic shiners 09/08/2023   Autoimmune disease (HCC) 09/08/2023   Disorder of connective tissue (HCC) 09/08/2023   Lentigo 09/08/2023   Squamous cell carcinoma in situ of skin of abdomen 09/08/2023   Monoclonal MCAS (HCC) 08/25/2023   Strain of muscle(s) and tendon(s) of the rotator cuff of right shoulder, subsequent encounter 08/25/2023   Pain in joint of right shoulder 07/07/2023   Tear of lateral meniscus of knee 07/07/2023   Other allergic rhinitis 06/05/2023   Moderate persistent asthma, uncomplicated 06/05/2023   Assistance needed with transportation 02/13/2023   Impaired mobility and ADLs 02/13/2023  Dyssynergic defecation 12/13/2022   Postural dizziness with presyncope 11/05/2022   Auditory processing disorder 09/22/2022   Anxiety 09/18/2022   Depression 09/18/2022   Panic attack due to post traumatic stress disorder (PTSD) 09/18/2022   History of penicillin allergy  08/29/2022   Muscle weakness (generalized) 06/30/2022   Arthralgia of right knee 06/14/2022   At high risk for breast cancer 04/19/2022   Allergic rhinitis due to pollen 04/18/2022   Chronic fatigue syndrome 02/03/2022   Common variable immunodeficiency, unspecified (HCC) 12/02/2021   Postural orthostatic tachycardia syndrome (POTS) 11/25/2021    Vasovagal syncope 11/25/2021   MGUS (monoclonal gammopathy of unknown significance) 10/25/2021   Smoldering multiple myeloma (SMM) 10/25/2021   Acquired primary hypogammaglobulinemia (HCC) 09/10/2021   Carpal tunnel syndrome of right wrist 03/11/2021   Encounter for orthopedic follow-up care 07/07/2020   Pain in right hand 06/03/2020   Gluten intolerance 04/11/2020   Non-restorative sleep 01/14/2020   Menopausal and female climacteric states 01/14/2020   Insomnia due to medical condition 01/14/2020   Asthma 06/18/2019   Migraine 06/18/2019   Osteopenia of multiple sites 06/18/2019   Chronic right-sided low back pain 02/22/2018   Hypertrophy of vulva 07/20/2017   Osteoarthritis 04/01/2017   Fibromyalgia 08/10/2016   Chronic pain syndrome 08/10/2016   Lumbar radiculopathy 02/16/2016   Hearing impairment 12/04/2015   Chronic cough 12/02/2015   Irritable larynx syndrome 12/02/2015   Genetic testing 09/21/2015   Family history of breast cancer in female 08/26/2015   Fibrocystic breast changes of both breasts 07/19/2015   Carpal tunnel syndrome 07/07/2015   Primary arthrosis of first carpometacarpal joints, bilateral 06/01/2015   Ehlers-Danlos syndrome 05/11/2015   Sacroiliac joint dysfunction of right side 11/26/2014   Hypermobility syndrome 11/26/2014   Essential alopecia of women 04/14/2011   Inflammatory dermatosis 04/14/2011   Post-traumatic stress disorder, chronic 05/01/2007   Adjustment disorder with mixed anxiety and depressed mood 12/01/2006   Irritable bowel syndrome with both constipation and diarrhea 04/11/1998    PCP: Betty Swaziland MD  REFERRING PROVIDER: [name], Raymond J, MD   REFERRING DIAG: G90.A (ICD-10-CM) - Postural orthostatic tachycardia syndrome (POTS)   Rationale for Evaluation and Treatment: Rehabilitation  THERAPY DIAG:  Muscle weakness (generalized)  Other chronic pain  ONSET DATE: Chronic  SUBJECTIVE:                                                                                                                                                                                            SUBJECTIVE STATEMENT: I have had many different doctors am being treated by multiple PT's.  I have multiple different issues.  Having trouble  getting the treatment I need. Pain general everywhere.  Already doing the right start program here at [facility]. End goal is to get a membership here for access to warm pool.  PERTINENT HISTORY:  Evaluate and Treat. 1-2 times per week for 4-6 weeks. Decrease pain, increase strength, flexibility, function, and range of motion. Modalities may include, traction, ionto, phono, and stim. May include dry needling with or without stim.  EDS; chronic pain syndrome; fibromyalgia; PTSD;dysautonomia   PAIN:  Are you having pain? Yes: NPRS scale: current 6/10; Normal 7-8/10 Pain location: general Pain description: dull ache, sharp Aggravating factors: movement Relieving factors: movement  PRECAUTIONS: None  RED FLAGS: None   WEIGHT BEARING RESTRICTIONS: No  FALLS:  Has patient fallen in last 6 months? Yes. Number of falls 3 POTS  LIVING ENVIRONMENT: Lives with: lives alone Lives in: House/apartment   PLOF: Independent  PATIENT GOALS: progressive exercises to work on.    NEXT MD VISIT: today  OBJECTIVE:  Note: Objective measures were completed at Evaluation unless otherwise noted.    PATIENT SURVEYS:  The Activities-Specific Balance Confidence (ABC) Scale 0% 10 20 30  40 50 60 70 80 90 100% No confidence<->completely confident  "How confident are you that you will not lose your balance or become unsteady when you . . .   Date tested 10/02/23  Walk around the house 10%  2. Walk up or down stairs 10%  3. Bend over and pick up a slipper from in front of a closet floor 10%  4. Reach for a small can off a shelf at eye level 20%  5. Stand on tip toes and reach for something above your head 20%  6.  Stand on a chair and reach for something 0%  7. Sweep the floor 10%  8. Walk outside the house to a car parked in the driveway 10%  9. Get into or out of a car 10%  10. Walk across a parking lot to the mall 30%  11. Walk up or down a ramp 10%  12. Walk in a crowded mall where people rapidly walk past you 0%  13. Are bumped into by people as you walk through the mall 0%  14. Step onto or off of an escalator while you are holding onto the railing 0%  15. Step onto or off an escalator while holding onto parcels such that you cannot hold onto the railing 0%  16. Walk outside on icy sidewalks 10%  Total: #/16 150/16=9.3%     COGNITION: Overall cognitive status: Within functional limits for tasks assessed     SENSATION: WFL   POSTURE: No Significant postural limitations   LUMBAR ROM:  Full/hypermobile  LOWER EXTREMITY ROM:    Full/hypermobile  LOWER EXTREMITY STRENGTH:    HD (Lbs) Right eval Left eval  Hip flexion 35.0 35.4  Hip extension    Hip abduction 22.0 20.2  Hip adduction    Hip internal rotation    Hip external rotation    Knee flexion    Knee extension 32.1 32.0  Ankle dorsiflexion    Ankle plantarflexion    Ankle inversion    Ankle eversion     (Blank rows = not tested) Item Test date: 10/02/23 Test date:  Test date:   Sitting to standing 3. able to stand independently using hands Insert OPRCBERGREEVAL SmartPhrase at re-test date Insert OPRCBERGREEVAL SmartPhrase at re-test date  2. Standing unsupported 4. able to stand safely for 2 minutes    3.  Sitting with back unsupported, feet supported 4. able to sit safely and securely for 2 minutes    4. Standing to sitting 3. controls descent by using hands    5. Pivot transfer  3. able to transfer safely with definite need of hands    6. Standing unsupported with eyes closed 3. able to stand 10 seconds with supervision    7. Standing unsupported with feet together 4. able to place feet together independently and  stand 1 minute safely    8. Reaching forward with outstretched arms while standing 3. can reach forward 12 cm (5 inches)    9. Pick up object from the floor from standing 4. able to pick up slipper safely and easily    10. Turning to look behind over left and right shoulders while standing 4. looks behind from both sides and weight shifts well    11. Turn 360 degrees 2. able to turn 360 degrees safely but slowly    12. Place alternate foot on step or stool while standing unsupported 2. able to complete 4 steps without aid with supervision    13. Standing unsupported one foot in front 3. able to place foot ahead independently and hold 30 seconds    14. Standing on one leg 3. able to lift leg independently and hold 5-10 seconds     Total Score 45/56 Total Score /56 Total Score /56     FUNCTIONAL TESTS:  5 times sit to stand: 12.60 pool bench Timed up and go (TUG): 11.42  GAIT: Distance walked: 400 Assistive device utilized: None Level of assistance: Complete Independence Comments: Guarded trunk positioning with min arm swing, slightly shortened step length   TREATMENT  Eval Self care:Posture and body Curator instruction. Pool access; importance of slowly increasing activity in pool, modifying activity to toleration; pain management                                                                                                                          PATIENT EDUCATION:  Education details: Objective findings, POC, rationale  Person educated: Patient Education method: Explanation Education comprehension: verbalized understanding  HOME EXERCISE PROGRAM: TBA  ASSESSMENT:  CLINICAL IMPRESSION: Patient is a 58 y.o. f who was seen today for physical therapy evaluation and treatment for POTS; EDS. Pt presents today by herself.  She reports high levels of pain due to EDS, chronic fatigue and chronic pain syndrome.  She has had multiple falls due to EDS and dysautonomia.  She has been  seen by multiple MD's and therapists over the last few months and feels as though all of her issues are not being properly addressed.  Her goals here today are to be instructed on an aquatic exercise program that she can progress/regress, dependant on how she is feeling on her own.  She has had aquatic therapy before and states it was helpful. She is having a good day today. Her strength testing demonstrate weakness in hip abd  and knee extensors and balance not particularly challenged as per Lars but her ABC Scale states she is 93% limited. She also reports her QOL is poor.  She will benefit from skilled PT intervention in aquatics to improve all areas of deficit as well as QOL  OBJECTIVE IMPAIRMENTS: decreased activity tolerance, decreased balance, decreased mobility, difficulty walking, decreased strength, impaired flexibility, and pain.   ACTIVITY LIMITATIONS: carrying, lifting, squatting, and locomotion level  PARTICIPATION LIMITATIONS: meal prep, cleaning, laundry, community activity, occupation, and yard work  PERSONAL FACTORS: Past/current experiences, Time since onset of injury/illness/exacerbation, and 3+ comorbidities: see Pmhx are also affecting patient's functional outcome.   REHAB POTENTIAL: Good  CLINICAL DECISION MAKING: Evolving/moderate complexity  EVALUATION COMPLEXITY: High   GOALS: Goals reviewed with patient? Yes  SHORT TERM GOALS: Target date: 10/19/23  Pt will tolerate full aquatic sessions consistently without increase in pain and with improving function to demonstrate good toleration and effectiveness of intervention.  Baseline: Goal status: INITIAL  2.  Pt will report reduction of general pain while submerged by 50% Baseline:  Goal status: INITIAL    LONG TERM GOALS: Target date: 12/01/23  Pt will improve on ABC Scale by at least 6% to demonstration statistically significant improvement. Baseline: 150/160=9.3% Goal status: INITIAL  2.  Pt will be indep  with final Aquatic HEP for continued management of condition Baseline:  Goal status: INITIAL  3.  Pt will report normal pain to be reduced by 2 NPRS. Baseline: see chart Goal status: INITIAL  4.  Pt will improve on Berg balance test to >/= 50/56  (5pt MDC) to demonstrate a decrease in fall risk. Baseline: 45/56 Goal status: INITIAL  5.  Pt will improve on 5 X STS test to <or=10s (MDC2.3s) to demonstrate improving functional lower extremity strength, transitional movements, and balance Baseline: 12.60 Goal status: INITIAL   PLAN:  PT FREQUENCY: 1-2x/week  PT DURATION: 8 weeks  PLANNED INTERVENTIONS: 97164- PT Re-evaluation, 97750- Physical Performance Testing, 97110-Therapeutic exercises, 97530- Therapeutic activity, 97112- Neuromuscular re-education, 97535- Self Care, 02859- Manual therapy, [id number]- Gait training, [phone]- Aquatic Therapy, [phone]- Electrical stimulation (unattended), [phone]- Ionotophoresis 4mg /ml Dexamethasone , 79439 (1-2 muscles), 20561 (3+ muscles)- Dry Needling, Patient/Family education, Balance training, Stair training, Taping, Joint mobilization, Cryotherapy, and Moist heat.  PLAN FOR NEXT SESSION: Aquatic: pain management; general strengthening, balance and proprioception re training and aerobic capacity .   Ronal [place]) Avo Schlachter MPT 10/02/23 1:15 PM [facility] [address] [place], [place], [address] Phone: [phone]   Fax:  [phone]  Addend Ronal Foots) Lilianne Delair MPT 10/02/23 3:06 PM [facility] [address] [place], [place], [address] Phone: [phone]   Fax:  [phone]   For all possible CPT codes, reference the Planned Interventions line above.     Check all conditions that are expected to impact treatment: {Conditions expected to impact treatment:Musculoskeletal disorders   If treatment provided at initial evaluation, no treatment  charged due to lack of authorization.

## 2023-10-03 ENCOUNTER — Other Ambulatory Visit (HOSPITAL_BASED_OUTPATIENT_CLINIC_OR_DEPARTMENT_OTHER): Payer: Self-pay

## 2023-10-03 ENCOUNTER — Other Ambulatory Visit: Payer: Self-pay

## 2023-10-05 ENCOUNTER — Inpatient Hospital Stay: Attending: Hematology and Oncology

## 2023-10-09 ENCOUNTER — Other Ambulatory Visit (HOSPITAL_BASED_OUTPATIENT_CLINIC_OR_DEPARTMENT_OTHER): Payer: Self-pay

## 2023-10-11 ENCOUNTER — Ambulatory Visit (HOSPITAL_BASED_OUTPATIENT_CLINIC_OR_DEPARTMENT_OTHER): Attending: Family Medicine | Admitting: Physical Therapy

## 2023-10-11 ENCOUNTER — Ambulatory Visit (HOSPITAL_BASED_OUTPATIENT_CLINIC_OR_DEPARTMENT_OTHER): Admitting: Family Medicine

## 2023-10-11 ENCOUNTER — Encounter (HOSPITAL_BASED_OUTPATIENT_CLINIC_OR_DEPARTMENT_OTHER): Payer: Self-pay | Admitting: Physical Therapy

## 2023-10-11 DIAGNOSIS — R278 Other lack of coordination: Secondary | ICD-10-CM | POA: Insufficient documentation

## 2023-10-11 DIAGNOSIS — G8929 Other chronic pain: Secondary | ICD-10-CM | POA: Diagnosis present

## 2023-10-11 DIAGNOSIS — M6281 Muscle weakness (generalized): Secondary | ICD-10-CM | POA: Insufficient documentation

## 2023-10-11 NOTE — Therapy (Signed)
 OUTPATIENT PHYSICAL THERAPY THORACOLUMBAR TREATMENT   Patient Name: Lindsey [name] MRN: [id number] DOB:Jan 30, 1966, 58 y.o., female Today's Date: 10/11/2023  END OF SESSION:  PT End of Session - 10/11/23 1320     Visit Number 2    Number of Visits 12    Date for PT Re-Evaluation 12/01/23    Authorization Type mcaid    PT Start Time 1311    PT Stop Time 1353    PT Time Calculation (min) 42 min    Activity Tolerance Patient tolerated treatment well    Behavior During Therapy [facility] for tasks assessed/performed          Past Medical History:  Diagnosis Date   Allergy     Angio-edema    Anxiety 09/18/2022   Arthritis    Asthma    Autoimmune disease (HCC) 09/08/2023   Cancer (HCC) 10/25/2021   Carpal tunnel syndrome 07/07/2015   right   Chronic cough 12/02/2015   Chronic pain syndrome 08/10/2016   CVID (common variable immunodeficiency) (HCC)    Depression 09/18/2022   Ehlers-Danlos syndrome    Essential alopecia of women 04/14/2011   Family history of breast cancer in female 08/26/2015   (x2) paternal 1st cousins dx in their 24s    Fibrocystic breast changes of both breasts 07/19/2015   Fibromyalgia    HLD (hyperlipidemia)    Hypermobility syndrome 11/26/2014   Based on Fam Hx and Beighton Score of 7 this is likely ED III    Hypertrophy of vulva 07/20/2017   Inflammatory dermatosis 04/14/2011   Irritable larynx syndrome 12/02/2015   Lumbar radiculopathy 02/16/2016   MGUS (monoclonal gammopathy of unknown significance)    Osteoarthritis 04/01/2017   Osteopenia    Osteoporosis 12/05/2017   Primary arthrosis of first carpometacarpal joints, bilateral 06/01/2015   Sacroiliac joint dysfunction of right side 11/26/2014   B SI joint xrays last done at [facility] Dr. Curt 03/13/2017   Skin cancer    Past Surgical History:  Procedure Laterality Date   BREAST EXCISIONAL BIOPSY Left 01/2019   atypical lobular hyperplasia   BREAST SURGERY  02/05/2019   Atypical  Lobular Hyperplasia   CARPAL TUNNEL RELEASE Right 06/2020   COLONOSCOPY     DENTAL SURGERY     ELBOW FRACTURE SURGERY Left    as child   EYE SURGERY  2005   LASIK   FRACTURE SURGERY  1974   L elbow break   JOINT REPLACEMENT  07/07/2020   first metacarpal joint R hand   RADIOACTIVE SEED GUIDED EXCISIONAL BREAST BIOPSY Left 02/05/2019   Procedure: RADIOACTIVE SEED GUIDED EXCISIONAL LEFT BREAST BIOPSY;  Surgeon: Ebbie Cough, MD;  Location: [facility];  Service: General;  Laterality: Left;   SPINE SURGERY  2019   RFA, Lynwood Boor; [facility]   Patient Active Problem List   Diagnosis Date Noted   Acne vulgaris 09/08/2023   Allergic shiners 09/08/2023   Autoimmune disease (HCC) 09/08/2023   Disorder of connective tissue (HCC) 09/08/2023   Lentigo 09/08/2023   Squamous cell carcinoma in situ of skin of abdomen 09/08/2023   Monoclonal MCAS (HCC) 08/25/2023   Strain of muscle(s) and tendon(s) of the rotator cuff of right shoulder, subsequent encounter 08/25/2023   Pain in joint of right shoulder 07/07/2023   Tear of lateral meniscus of knee 07/07/2023   Other allergic rhinitis 06/05/2023   Moderate persistent asthma, uncomplicated 06/05/2023   Assistance needed with transportation 02/13/2023   Impaired mobility and ADLs 02/13/2023  Dyssynergic defecation 12/13/2022   Postural dizziness with presyncope 11/05/2022   Auditory processing disorder 09/22/2022   Anxiety 09/18/2022   Depression 09/18/2022   Panic attack due to post traumatic stress disorder (PTSD) 09/18/2022   History of penicillin allergy  08/29/2022   Muscle weakness (generalized) 06/30/2022   Arthralgia of right knee 06/14/2022   At high risk for breast cancer 04/19/2022   Allergic rhinitis due to pollen 04/18/2022   Chronic fatigue syndrome 02/03/2022   Common variable immunodeficiency, unspecified (HCC) 12/02/2021   Postural orthostatic tachycardia syndrome (POTS) 11/25/2021    Vasovagal syncope 11/25/2021   MGUS (monoclonal gammopathy of unknown significance) 10/25/2021   Smoldering multiple myeloma (SMM) 10/25/2021   Acquired primary hypogammaglobulinemia (HCC) 09/10/2021   Carpal tunnel syndrome of right wrist 03/11/2021   Encounter for orthopedic follow-up care 07/07/2020   Pain in right hand 06/03/2020   Gluten intolerance 04/11/2020   Non-restorative sleep 01/14/2020   Menopausal and female climacteric states 01/14/2020   Insomnia due to medical condition 01/14/2020   Asthma 06/18/2019   Migraine 06/18/2019   Osteopenia of multiple sites 06/18/2019   Chronic right-sided low back pain 02/22/2018   Hypertrophy of vulva 07/20/2017   Osteoarthritis 04/01/2017   Fibromyalgia 08/10/2016   Chronic pain syndrome 08/10/2016   Lumbar radiculopathy 02/16/2016   Hearing impairment 12/04/2015   Chronic cough 12/02/2015   Irritable larynx syndrome 12/02/2015   Genetic testing 09/21/2015   Family history of breast cancer in female 08/26/2015   Fibrocystic breast changes of both breasts 07/19/2015   Carpal tunnel syndrome 07/07/2015   Primary arthrosis of first carpometacarpal joints, bilateral 06/01/2015   Ehlers-Danlos syndrome 05/11/2015   Sacroiliac joint dysfunction of right side 11/26/2014   Hypermobility syndrome 11/26/2014   Essential alopecia of women 04/14/2011   Inflammatory dermatosis 04/14/2011   Post-traumatic stress disorder, chronic 05/01/2007   Adjustment disorder with mixed anxiety and depressed mood 12/01/2006   Irritable bowel syndrome with both constipation and diarrhea 04/11/1998    PCP: Betty Swaziland MD  REFERRING PROVIDER: [name], Raymond J, MD   REFERRING DIAG: G90.A (ICD-10-CM) - Postural orthostatic tachycardia syndrome (POTS)   Rationale for Evaluation and Treatment: Rehabilitation  THERAPY DIAG:  Muscle weakness (generalized)  Other chronic pain  ONSET DATE: Chronic  SUBJECTIVE:                                                                                                                                                                                            SUBJECTIVE STATEMENT: Pt reports she is not afraid of water, but does not know how to swim.   She has been doing  Right Start program at [facility].   POOL ACCESS: currently none, but plans to join [facility].  From initial evaluation:  I have had many different doctors am being treated by multiple PT's.  I have multiple different issues.  Having trouble getting the treatment I need. Pain general everywhere.  Already doing the right start program here at [facility]. End goal is to get a membership here for access to warm pool.  PERTINENT HISTORY:  Evaluate and Treat. 1-2 times per week for 4-6 weeks. Decrease pain, increase strength, flexibility, function, and range of motion. Modalities may include, traction, ionto, phono, and stim. May include dry needling with or without stim.  EDS; chronic pain syndrome; fibromyalgia; PTSD;dysautonomia   PAIN:  Are you having pain? Yes: NPRS scale: 8/10 Pain location: general Pain description: dull ache, sharp Aggravating factors: movement Relieving factors: movement  PRECAUTIONS: None  RED FLAGS: None   WEIGHT BEARING RESTRICTIONS: No  FALLS:  Has patient fallen in last 6 months? Yes. Number of falls 3 POTS  LIVING ENVIRONMENT: Lives with: lives alone Lives in: House/apartment   PLOF: Independent  PATIENT GOALS: progressive exercises to work on.    NEXT MD VISIT: today  OBJECTIVE:  Note: Objective measures were completed at Evaluation unless otherwise noted.    PATIENT SURVEYS:  The Activities-Specific Balance Confidence (ABC) Scale 0% 10 20 30  40 50 60 70 80 90 100% No confidence<->completely confident  "How confident are you that you will not lose your balance or become unsteady when you . . .   Date tested 10/02/23  Walk around the house 10%  2. Walk up or down stairs 10%  3. Bend  over and pick up a slipper from in front of a closet floor 10%  4. Reach for a small can off a shelf at eye level 20%  5. Stand on tip toes and reach for something above your head 20%  6. Stand on a chair and reach for something 0%  7. Sweep the floor 10%  8. Walk outside the house to a car parked in the driveway 10%  9. Get into or out of a car 10%  10. Walk across a parking lot to the mall 30%  11. Walk up or down a ramp 10%  12. Walk in a crowded mall where people rapidly walk past you 0%  13. Are bumped into by people as you walk through the mall 0%  14. Step onto or off of an escalator while you are holding onto the railing 0%  15. Step onto or off an escalator while holding onto parcels such that you cannot hold onto the railing 0%  16. Walk outside on icy sidewalks 10%  Total: #/16 150/16=9.3%     COGNITION: Overall cognitive status: Within functional limits for tasks assessed     SENSATION: WFL   POSTURE: No Significant postural limitations   LUMBAR ROM:  Full/hypermobile  LOWER EXTREMITY ROM:    Full/hypermobile  LOWER EXTREMITY STRENGTH:    HD (Lbs) Right eval Left eval  Hip flexion 35.0 35.4  Hip extension    Hip abduction 22.0 20.2  Hip adduction    Hip internal rotation    Hip external rotation    Knee flexion    Knee extension 32.1 32.0  Ankle dorsiflexion    Ankle plantarflexion    Ankle inversion    Ankle eversion     (Blank rows = not tested) Item Test date: 10/02/23 Test date:  Test date:  Sitting to standing 3. able to stand independently using hands Insert OPRCBERGREEVAL SmartPhrase at re-test date Insert OPRCBERGREEVAL SmartPhrase at re-test date  2. Standing unsupported 4. able to stand safely for 2 minutes    3. Sitting with back unsupported, feet supported 4. able to sit safely and securely for 2 minutes    4. Standing to sitting 3. controls descent by using hands    5. Pivot transfer  3. able to transfer safely with definite need of  hands    6. Standing unsupported with eyes closed 3. able to stand 10 seconds with supervision    7. Standing unsupported with feet together 4. able to place feet together independently and stand 1 minute safely    8. Reaching forward with outstretched arms while standing 3. can reach forward 12 cm (5 inches)    9. Pick up object from the floor from standing 4. able to pick up slipper safely and easily    10. Turning to look behind over left and right shoulders while standing 4. looks behind from both sides and weight shifts well    11. Turn 360 degrees 2. able to turn 360 degrees safely but slowly    12. Place alternate foot on step or stool while standing unsupported 2. able to complete 4 steps without aid with supervision    13. Standing unsupported one foot in front 3. able to place foot ahead independently and hold 30 seconds    14. Standing on one leg 3. able to lift leg independently and hold 5-10 seconds     Total Score 45/56 Total Score /56 Total Score /56     FUNCTIONAL TESTS:  5 times sit to stand: 12.60 pool bench Timed up and go (TUG): 11.42  GAIT: Distance walked: 400 Assistive device utilized: None Level of assistance: Complete Independence Comments: Guarded trunk positioning with min arm swing, slightly shortened step length   TREATMENT  [facility] Adult PT Treatment:                                             Date:  Pt seen for aquatic therapy today.  Treatment took place in water 3.5-4.75 ft in depth at the [facility] pool. Temp of water was 91.  Pt entered/exited the pool via stairs independently with bilat rail.  - Intro to aquatic therapy principles - UE on yellow hand floats walking forward/ backward - cues for vertical trunk and even step length - UE on yellow hand floats, horiz abdct/addct and side stepping  - suitcase carry with walking forward/ backward with single/ bilat rainbow hand floats at side - UE on wall:  toe/heel raises x 10; hip add/abd x 10  ;  - walking unsupported forward/ backward with reciprocal arm swing and side stepping with arm addct/ abdct  Pt requires the buoyancy and hydrostatic pressure of water for support, and to offload joints by unweighting joint load by at least 50 % in navel deep water and by at least 75-80% in chest to neck deep water.  Viscosity of the water is needed for resistance of strengthening. Water current perturbations provides challenge to standing balance requiring increased core activation.  PATIENT EDUCATION:  Education details: Objective findings, POC, rationale  Person educated: Patient Education method: Explanation Education comprehension: verbalized understanding  HOME EXERCISE PROGRAM: TBA  ASSESSMENT:  CLINICAL IMPRESSION: Pt demonstrates safety and independence in aquatic setting with therapist instructing from deck. Pt demonstrates confidence in setting, moving through depths up to 4 ft easily.  Pt is directed through various movement patterns and trials in standing position. Pt reported some dizziness when looking at water; improved with eyes focused on landmark on wall at horizon. Good tolerance for exercises completed today.  Will be cautious when utilizing buoyant resistance under water.  Goals are ongoing.     From initial evaluation:  Patient is a 58 y.o. f who was seen today for physical therapy evaluation and treatment for POTS; EDS. Pt presents today by herself.  She reports high levels of pain due to EDS, chronic fatigue and chronic pain syndrome.  She has had multiple falls due to EDS and dysautonomia.  She has been seen by multiple MD's and therapists over the last few months and feels as though all of her issues are not being properly addressed.  Her goals here today are to be instructed on an aquatic exercise program that she can progress/regress, dependant on how she is feeling on her own.  She has had aquatic therapy before and  states it was helpful. She is having a good day today. Her strength testing demonstrate weakness in hip abd and knee extensors and balance not particularly challenged as per Lars but her ABC Scale states she is 93% limited. She also reports her QOL is poor.  She will benefit from skilled PT intervention in aquatics to improve all areas of deficit as well as QOL  OBJECTIVE IMPAIRMENTS: decreased activity tolerance, decreased balance, decreased mobility, difficulty walking, decreased strength, impaired flexibility, and pain.   ACTIVITY LIMITATIONS: carrying, lifting, squatting, and locomotion level  PARTICIPATION LIMITATIONS: meal prep, cleaning, laundry, community activity, occupation, and yard work  PERSONAL FACTORS: Past/current experiences, Time since onset of injury/illness/exacerbation, and 3+ comorbidities: see Pmhx are also affecting patient's functional outcome.   REHAB POTENTIAL: Good  CLINICAL DECISION MAKING: Evolving/moderate complexity  EVALUATION COMPLEXITY: High   GOALS: Goals reviewed with patient? Yes  SHORT TERM GOALS: Target date: 10/19/23  Pt will tolerate full aquatic sessions consistently without increase in pain and with improving function to demonstrate good toleration and effectiveness of intervention.  Baseline: Goal status: INITIAL  2.  Pt will report reduction of general pain while submerged by 50% Baseline:  Goal status: INITIAL    LONG TERM GOALS: Target date: 12/01/23  Pt will improve on ABC Scale by at least 6% to demonstration statistically significant improvement. Baseline: 150/160=9.3% Goal status: INITIAL  2.  Pt will be indep with final Aquatic HEP for continued management of condition Baseline:  Goal status: INITIAL  3.  Pt will report normal pain to be reduced by 2 NPRS. Baseline: see chart Goal status: INITIAL  4.  Pt will improve on Berg balance test to >/= 50/56  (5pt MDC) to demonstrate a decrease in fall risk. Baseline:  45/56 Goal status: INITIAL  5.  Pt will improve on 5 X STS test to <or=10s (MDC2.3s) to demonstrate improving functional lower extremity strength, transitional movements, and balance Baseline: 12.60 Goal status: INITIAL   PLAN:  PT FREQUENCY: 1-2x/week  PT DURATION: 8 weeks  PLANNED INTERVENTIONS: 97164- PT Re-evaluation, 97750- Physical Performance Testing, 97110-Therapeutic exercises, 97530- Therapeutic activity, [id number]- Neuromuscular re-education, 97535- Self Care, 02859-  Manual therapy, [id number]- Gait training, 02886- Aquatic Therapy, [phone]- Electrical stimulation (unattended), [phone]- Ionotophoresis 4mg /ml Dexamethasone , 20560 (1-2 muscles), 20561 (3+ muscles)- Dry Needling, Patient/Family education, Balance training, Stair training, Taping, Joint mobilization, Cryotherapy, and Moist heat.  PLAN FOR NEXT SESSION: Aquatic: pain management; general strengthening, balance and proprioception re training and aerobic capacity .  Delon Aquas, PTA 10/11/23 2:51 PM [facility] [address] [place], [place], [address] Phone: [phone]   Fax:  [phone]     For all possible CPT codes, reference the Planned Interventions line above.     Check all conditions that are expected to impact treatment: {Conditions expected to impact treatment:Musculoskeletal disorders   If treatment provided at initial evaluation, no treatment charged due to lack of authorization.

## 2023-10-12 NOTE — Progress Notes (Signed)
 [name] [address] [place] [address] Phone: [phone] Subjective:   [name], am serving as a scribe for Dr. Arthea Claudene.  I'm seeing this patient by the request  of:  [name], [name], MD  CC: Back pain, neck pain, allover pain  Lindsey [name]  Lindsey [name] is a 58 y.o. female coming in with complaint of polyarthralgia.  Past medical history significant for [facility] patient states that she hurts all over. Pain in lumbar spine, neck and R shoulder. Has had cortisone injections in back and RFA. Epidural injections were not helpful but RFA was. States that insurance is no longer covering RFA. Use to use Tramadol  which was helpful but original prescriber no longer giving it to patient. On wait list for [facility] for EDS treatment. Patient was doing PT which was helpful but unable to maintain exercises.  Patient has had difficulty with multiple different problems.  Problem list is quite extensive.     Past Medical History:  Diagnosis Date   Allergy     Angio-edema    Anxiety 09/18/2022   Arthritis    Asthma    Autoimmune disease (HCC) 09/08/2023   Cancer (HCC) 10/25/2021   Carpal tunnel syndrome 07/07/2015   right   Chronic cough 12/02/2015   Chronic pain syndrome 08/10/2016   CVID (common variable immunodeficiency) (HCC)    Depression 09/18/2022   Ehlers-Danlos syndrome    Essential alopecia of women 04/14/2011   Family history of breast cancer in female 08/26/2015   (x2) paternal 1st cousins dx in their 19s    Fibrocystic breast changes of both breasts 07/19/2015   Fibromyalgia    HLD (hyperlipidemia)    Hypermobility syndrome 11/26/2014   Based on Fam Hx and Beighton Score of 7 this is likely ED III    Hypertrophy of vulva 07/20/2017   Inflammatory dermatosis 04/14/2011   Irritable larynx syndrome 12/02/2015   Lumbar radiculopathy 02/16/2016   MGUS (monoclonal gammopathy of unknown significance)    Osteoarthritis  04/01/2017   Osteopenia    Osteoporosis 12/05/2017   Primary arthrosis of first carpometacarpal joints, bilateral 06/01/2015   Sacroiliac joint dysfunction of right side 11/26/2014   B SI joint xrays last done at [facility] Dr. Curt 03/13/2017   Skin cancer    Past Surgical History:  Procedure Laterality Date   BREAST EXCISIONAL BIOPSY Left 01/2019   atypical lobular hyperplasia   BREAST SURGERY  02/05/2019   Atypical Lobular Hyperplasia   CARPAL TUNNEL RELEASE Right 06/2020   COLONOSCOPY     DENTAL SURGERY     ELBOW FRACTURE SURGERY Left    as child   EYE SURGERY  2005   LASIK   FRACTURE SURGERY  1974   L elbow break   JOINT REPLACEMENT  07/07/2020   first metacarpal joint R hand   RADIOACTIVE SEED GUIDED EXCISIONAL BREAST BIOPSY Left 02/05/2019   Procedure: RADIOACTIVE SEED GUIDED EXCISIONAL LEFT BREAST BIOPSY;  Surgeon: Ebbie Cough, MD;  Location:  SURGERY CENTER;  Service: General;  Laterality: Left;   SPINE SURGERY  2019   RFA, Lynwood Boor; [facility]   Social History   Socioeconomic History   Marital status: Single    Spouse name: n/a   Number of children: 0   Years of education: Not on file   Highest education level: Bachelor's degree (e.g., BA, AB, BS)  Occupational History   Occupation: Pharmacologist    Comment: [employer]  Tobacco Use   Smoking status: Never    Passive exposure: Never   Smokeless tobacco: Never   Tobacco comments:    No history; N/A  Vaping Use   Vaping status: Never Used  Substance and Sexual Activity   Alcohol use: No   Drug use: No   Sexual activity: Not Currently    Birth control/protection: Abstinence, Post-menopausal  Other Topics Concern   Not on file  Social History Narrative   Lives with alone   R handed   Caffeine: 1-2 C of coffee in the AM.   Social Drivers of Health   Financial Resource Strain: High Risk (09/04/2023)   Overall Financial Resource Strain (CARDIA)     Difficulty of Paying Living Expenses: Very hard  Food Insecurity: Food Insecurity Present (09/04/2023)   Hunger Vital Sign    Worried About Running Out of Food in the Last Year: Often true    Ran Out of Food in the Last Year: Often true  Transportation Needs: Unmet Transportation Needs (09/04/2023)   PRAPARE - Transportation    Lack of Transportation (Medical): Yes    Lack of Transportation (Non-Medical): Yes  Physical Activity: Unknown (09/04/2023)   Exercise Vital Sign    Days of Exercise per Week: 0 days    Minutes of Exercise per Session: Not on file  Stress: Stress Concern Present (09/04/2023)   [employer] of Occupational Health - Occupational Stress Questionnaire    Feeling of Stress : Very much  Social Connections: Socially Isolated (09/04/2023)   Social Connection and Isolation Panel    Frequency of Communication with Friends and Family: Once a week    Frequency of Social Gatherings with Friends and Family: Never    Attends Religious Services: Never    Database administrator or Organizations: No    Attends Engineer, structural: Not on file    Marital Status: Never married   Allergies  Allergen Reactions   Poison Oak Extract [Poison Oak Extract] Anaphylaxis   Xiidra [Lifitegrast] Swelling    Eye swelling and blurred vision.    Levothyroxine Sodium    Ondansetron     Sulfa Antibiotics Nausea And Vomiting   Sulfamethoxazole-Trimethoprim    Tamoxifen Nausea Only and Other (See Comments)   Skin Protectants, Misc. Other (See Comments), Rash and Swelling   Family History  Problem Relation Age of Onset   Hypertension Mother    Other Mother        hx of hysterectomy at 3-56 for prolapsed uterus; hx of benign L arm cyst in her 57s   Arthritis Mother    Hyperlipidemia Mother    Stroke Father    Hypertension Father    Skin cancer Father 49       NOS   Other Father        enlarged prostate s/p surgery   Alcohol abuse Father    Arthritis Father     Hyperlipidemia Father    Alzheimer's disease Maternal Aunt    Stomach cancer Paternal Aunt        d. 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 75-76   Cancer Maternal Grandfather    Heart Problems Paternal Grandmother    Heart Problems Paternal Grandfather    Colon cancer Other  maternal great aunt (MGM's sister) dx in her 62s   Heart attack Paternal Uncle 38   Heart Problems Paternal Uncle    Angina Paternal Uncle    Breast cancer Cousin        paternal 1st cousin d. 6   Breast cancer Cousin        paternal 1st cousin dx. 50s   Colon polyps Neg Hx    Esophageal cancer Neg Hx    Rectal cancer Neg Hx     Current Outpatient Medications (Endocrine & Metabolic):    estradiol  (VIVELLE -DOT) 0.0375 MG/24HR, Place 1 patch onto the skin twice a week.   estradiol  (VIVELLE -DOT) 0.1 MG/24HR patch, Place 1 patch onto the skin 2 (two) times a week.   progesterone  (PROMETRIUM ) 200 MG capsule, Take 3 capsules (600 mg total) by mouth at bedtime.   Testosterone  75 MG PLLT, 87.mg every 3 months, insert 1 pellet every 3 months.  Current Outpatient Medications (Cardiovascular):    EPINEPHrine  (EPIPEN  2-PAK) 0.3 mg/0.3 mL IJ SOAJ injection, Inject 0.3 mg into the muscle as needed for anaphylaxis.  Current Outpatient Medications (Respiratory):    albuterol  (PROVENTIL ) (2.5 MG/3ML) 0.083% nebulizer solution, Take 3 mLs (2.5 mg total) by nebulization every 6 (six) hours as needed.   albuterol  (VENTOLIN  HFA) 108 (90 Base) MCG/ACT inhaler, Inhale 2 puffs into the lungs every 6 (six) hours as needed for wheezing or shortness of breath.   budesonide -formoterol  (SYMBICORT ) 80-4.5 MCG/ACT inhaler, Inhale 2 puffs by mouth every 4 hours as needed, maximum 12 puffs per day and at onset of respiratory illness/asthma flare: Inhale 2  puffs twice daily with spacer for 2 weeks or until symptoms resolve.   fexofenadine  (ALLEGRA ) 180 MG tablet, Take 1 tablet (180 mg total) by mouth daily.   levocetirizine (XYZAL ) 5 MG tablet, Take 1 tablet (5 mg total) by mouth daily.   montelukast  (SINGULAIR ) 10 MG tablet, Take 1 tablet (10 mg total) by mouth at bedtime.  Current Outpatient Medications (Analgesics):    celecoxib  (CELEBREX ) 100 MG capsule, Take 1 capsule (100 mg total) by mouth 2 (two) times daily.   traMADol  (ULTRAM ) 50 MG tablet, Take 1-2 tablets (50-100 mg total) by mouth every 8 (eight) hours as needed for pain.   Current Outpatient Medications (Other):    amitriptyline  (ELAVIL ) 25 MG tablet, Take 1 tablet (25 mg total) by mouth at bedtime.   clonazePAM  (KLONOPIN ) 0.5 MG tablet, Take 1 tablet (0.5 mg total) by mouth at bedtime as needed for anxiety. Max daily amount: 0.5 mg   cycloSPORINE (RESTASIS) 0.05 % ophthalmic emulsion, Place 1 drop into both eyes 2 (two) times daily.   DULoxetine  (CYMBALTA ) 20 MG capsule, Take 1 capsule (20 mg total) by mouth daily.   gabapentin  (NEURONTIN ) 300 MG capsule, Take 2 capsules (600 mg total) by mouth at bedtime as needed.   Immune Globulin, Human, (HIZENTRA) 10 GM/50ML SOLN, Inject 10 g into the skin once a week.   methocarbamol  (ROBAXIN ) 500 MG tablet, Take 2 tablets (1,000 mg total) by mouth 2 (two) times daily as needed.   polyethylene glycol powder (GLYCOLAX /MIRALAX ) 17 GM/SCOOP powder, Take 17 g by mouth once daily. Mix per [facility] Preparation Instructions   sertraline  (ZOLOFT ) 50 MG tablet, Take one half tablet (25 mg dose) by mouth daily for 6 days, THEN one tablet (50 mg dose) daily.   Sodium Fluoride  1.1 % PSTE, use as directed   traZODone  (DESYREL ) 100 MG tablet, Take 1-2 tablets (100-200 mg total) by  mouth at bedtime.   linaclotide  (LINZESS ) 72 MCG capsule, Take 1 capsule (72 mcg total) by mouth daily. (Patient not taking: Reported on 10/02/2023)   Reviewed prior external  information including notes and imaging from  primary care provider As well as notes that were available from care everywhere and other healthcare systems.  Past medical history, social, surgical and family history all reviewed in electronic medical record.  No pertanent information unless stated regarding to the chief complaint.   Review of Systems: 10 system positive  Objective  Blood pressure 102/80, pulse 88, height 5' 6 (1.676 m), weight 132 lb (59.9 kg), last menstrual period 04/08/2014, SpO2 98%.   General: No apparent distress alert and oriented x3 mood and affect normal, dressed appropriately.  HEENT: Pupils equal, extraocular movements intact  Respiratory: Patient's speak in full sentences and does not appear short of breath  Cardiovascular: No lower extremity edema, non tender, no erythema  Patient does have some hypermobility with over extension of the knees.  Does have some overextension especially with inversion of the ankles.  Upper body unable to test secondary to discomfort especially in the hands and the wrist.  Postsurgical changes have been made previously as well.    Impression and Recommendations:      The above documentation has been reviewed and is accurate and complete Amri Lien M Veronda Gabor, DO

## 2023-10-17 ENCOUNTER — Encounter (HOSPITAL_BASED_OUTPATIENT_CLINIC_OR_DEPARTMENT_OTHER): Payer: Self-pay | Admitting: Physical Therapy

## 2023-10-17 ENCOUNTER — Ambulatory Visit (HOSPITAL_BASED_OUTPATIENT_CLINIC_OR_DEPARTMENT_OTHER): Admitting: Physical Therapy

## 2023-10-17 DIAGNOSIS — M6281 Muscle weakness (generalized): Secondary | ICD-10-CM

## 2023-10-17 DIAGNOSIS — R278 Other lack of coordination: Secondary | ICD-10-CM

## 2023-10-17 DIAGNOSIS — G8929 Other chronic pain: Secondary | ICD-10-CM

## 2023-10-17 NOTE — Therapy (Signed)
 OUTPATIENT PHYSICAL THERAPY THORACOLUMBAR TREATMENT   Patient Name: Lindsey [name] MRN: [id number] DOB:May 10, 1965, 58 y.o., female Today's Date: 10/17/2023  END OF SESSION:  PT End of Session - 10/17/23 1534     Visit Number 3    Number of Visits 12    Date for PT Re-Evaluation 12/01/23    Authorization Type mcaid    PT Start Time 1532    PT Stop Time 1610    PT Time Calculation (min) 38 min    Activity Tolerance Patient tolerated treatment well    Behavior During Therapy [facility] for tasks assessed/performed           Past Medical History:  Diagnosis Date   Allergy     Angio-edema    Anxiety 09/18/2022   Arthritis    Asthma    Autoimmune disease (HCC) 09/08/2023   Cancer (HCC) 10/25/2021   Carpal tunnel syndrome 07/07/2015   right   Chronic cough 12/02/2015   Chronic pain syndrome 08/10/2016   CVID (common variable immunodeficiency) (HCC)    Depression 09/18/2022   Ehlers-Danlos syndrome    Essential alopecia of women 04/14/2011   Family history of breast cancer in female 08/26/2015   (x2) paternal 1st cousins dx in their 64s    Fibrocystic breast changes of both breasts 07/19/2015   Fibromyalgia    HLD (hyperlipidemia)    Hypermobility syndrome 11/26/2014   Based on Fam Hx and Beighton Score of 7 this is likely ED III    Hypertrophy of vulva 07/20/2017   Inflammatory dermatosis 04/14/2011   Irritable larynx syndrome 12/02/2015   Lumbar radiculopathy 02/16/2016   MGUS (monoclonal gammopathy of unknown significance)    Osteoarthritis 04/01/2017   Osteopenia    Osteoporosis 12/05/2017   Primary arthrosis of first carpometacarpal joints, bilateral 06/01/2015   Sacroiliac joint dysfunction of right side 11/26/2014   B SI joint xrays last done at [facility] Dr. Curt 03/13/2017   Skin cancer    Past Surgical History:  Procedure Laterality Date   BREAST EXCISIONAL BIOPSY Left 01/2019   atypical lobular hyperplasia   BREAST SURGERY  02/05/2019   Atypical  Lobular Hyperplasia   CARPAL TUNNEL RELEASE Right 06/2020   COLONOSCOPY     DENTAL SURGERY     ELBOW FRACTURE SURGERY Left    as child   EYE SURGERY  2005   LASIK   FRACTURE SURGERY  1974   L elbow break   JOINT REPLACEMENT  07/07/2020   first metacarpal joint R hand   RADIOACTIVE SEED GUIDED EXCISIONAL BREAST BIOPSY Left 02/05/2019   Procedure: RADIOACTIVE SEED GUIDED EXCISIONAL LEFT BREAST BIOPSY;  Surgeon: Ebbie Cough, MD;  Location: [facility];  Service: General;  Laterality: Left;   SPINE SURGERY  2019   RFA, Lynwood Boor; [facility]   Patient Active Problem List   Diagnosis Date Noted   Acne vulgaris 09/08/2023   Allergic shiners 09/08/2023   Autoimmune disease (HCC) 09/08/2023   Disorder of connective tissue (HCC) 09/08/2023   Lentigo 09/08/2023   Squamous cell carcinoma in situ of skin of abdomen 09/08/2023   Monoclonal MCAS (HCC) 08/25/2023   Strain of muscle(s) and tendon(s) of the rotator cuff of right shoulder, subsequent encounter 08/25/2023   Pain in joint of right shoulder 07/07/2023   Tear of lateral meniscus of knee 07/07/2023   Other allergic rhinitis 06/05/2023   Moderate persistent asthma, uncomplicated 06/05/2023   Assistance needed with transportation 02/13/2023   Impaired mobility and ADLs  02/13/2023   Dyssynergic defecation 12/13/2022   Postural dizziness with presyncope 11/05/2022   Auditory processing disorder 09/22/2022   Anxiety 09/18/2022   Depression 09/18/2022   Panic attack due to post traumatic stress disorder (PTSD) 09/18/2022   History of penicillin allergy  08/29/2022   Muscle weakness (generalized) 06/30/2022   Arthralgia of right knee 06/14/2022   At high risk for breast cancer 04/19/2022   Allergic rhinitis due to pollen 04/18/2022   Chronic fatigue syndrome 02/03/2022   Common variable immunodeficiency, unspecified (HCC) 12/02/2021   Postural orthostatic tachycardia syndrome (POTS) 11/25/2021    Vasovagal syncope 11/25/2021   MGUS (monoclonal gammopathy of unknown significance) 10/25/2021   Smoldering multiple myeloma (SMM) 10/25/2021   Acquired primary hypogammaglobulinemia (HCC) 09/10/2021   Carpal tunnel syndrome of right wrist 03/11/2021   Encounter for orthopedic follow-up care 07/07/2020   Pain in right hand 06/03/2020   Gluten intolerance 04/11/2020   Non-restorative sleep 01/14/2020   Menopausal and female climacteric states 01/14/2020   Insomnia due to medical condition 01/14/2020   Asthma 06/18/2019   Migraine 06/18/2019   Osteopenia of multiple sites 06/18/2019   Chronic right-sided low back pain 02/22/2018   Hypertrophy of vulva 07/20/2017   Osteoarthritis 04/01/2017   Fibromyalgia 08/10/2016   Chronic pain syndrome 08/10/2016   Lumbar radiculopathy 02/16/2016   Hearing impairment 12/04/2015   Chronic cough 12/02/2015   Irritable larynx syndrome 12/02/2015   Genetic testing 09/21/2015   Family history of breast cancer in female 08/26/2015   Fibrocystic breast changes of both breasts 07/19/2015   Carpal tunnel syndrome 07/07/2015   Primary arthrosis of first carpometacarpal joints, bilateral 06/01/2015   Ehlers-Danlos syndrome 05/11/2015   Sacroiliac joint dysfunction of right side 11/26/2014   Hypermobility syndrome 11/26/2014   Essential alopecia of women 04/14/2011   Inflammatory dermatosis 04/14/2011   Post-traumatic stress disorder, chronic 05/01/2007   Adjustment disorder with mixed anxiety and depressed mood 12/01/2006   Irritable bowel syndrome with both constipation and diarrhea 04/11/1998    PCP: Betty Swaziland MD  REFERRING PROVIDER: [name], Raymond J, MD   REFERRING DIAG: G90.A (ICD-10-CM) - Postural orthostatic tachycardia syndrome (POTS)   Rationale for Evaluation and Treatment: Rehabilitation  THERAPY DIAG:  Muscle weakness (generalized)  Other chronic pain  Other lack of coordination  ONSET DATE: Chronic  SUBJECTIVE:                                                                                                                                                                                            SUBJECTIVE STATEMENT: Pt reports good response from 1st session. Its a good day  POOL  ACCESS: currently none, but plans to join [facility].  From initial evaluation:  I have had many different doctors am being treated by multiple PT's.  I have multiple different issues.  Having trouble getting the treatment I need. Pain general everywhere.  Already doing the right start program here at [facility]. End goal is to get a membership here for access to warm pool.  PERTINENT HISTORY:  Evaluate and Treat. 1-2 times per week for 4-6 weeks. Decrease pain, increase strength, flexibility, function, and range of motion. Modalities may include, traction, ionto, phono, and stim. May include dry needling with or without stim.  EDS; chronic pain syndrome; fibromyalgia; PTSD;dysautonomia   PAIN:  Are you having pain? Yes: NPRS scale: 6/10 Pain location: general Pain description: dull ache, sharp Aggravating factors: movement Relieving factors: movement  PRECAUTIONS: None  RED FLAGS: None   WEIGHT BEARING RESTRICTIONS: No  FALLS:  Has patient fallen in last 6 months? Yes. Number of falls 3 POTS  LIVING ENVIRONMENT: Lives with: lives alone Lives in: House/apartment   PLOF: Independent  PATIENT GOALS: progressive exercises to work on.    NEXT MD VISIT: today  OBJECTIVE:  Note: Objective measures were completed at Evaluation unless otherwise noted.    PATIENT SURVEYS:  The Activities-Specific Balance Confidence (ABC) Scale 0% 10 20 30  40 50 60 70 80 90 100% No confidence<->completely confident  "How confident are you that you will not lose your balance or become unsteady when you . . .   Date tested 10/02/23  Walk around the house 10%  2. Walk up or down stairs 10%  3. Bend over and pick up a slipper from in  front of a closet floor 10%  4. Reach for a small can off a shelf at eye level 20%  5. Stand on tip toes and reach for something above your head 20%  6. Stand on a chair and reach for something 0%  7. Sweep the floor 10%  8. Walk outside the house to a car parked in the driveway 10%  9. Get into or out of a car 10%  10. Walk across a parking lot to the mall 30%  11. Walk up or down a ramp 10%  12. Walk in a crowded mall where people rapidly walk past you 0%  13. Are bumped into by people as you walk through the mall 0%  14. Step onto or off of an escalator while you are holding onto the railing 0%  15. Step onto or off an escalator while holding onto parcels such that you cannot hold onto the railing 0%  16. Walk outside on icy sidewalks 10%  Total: #/16 150/16=9.3%     COGNITION: Overall cognitive status: Within functional limits for tasks assessed     SENSATION: WFL   POSTURE: No Significant postural limitations   LUMBAR ROM:  Full/hypermobile  LOWER EXTREMITY ROM:    Full/hypermobile  LOWER EXTREMITY STRENGTH:    HD (Lbs) Right eval Left eval  Hip flexion 35.0 35.4  Hip extension    Hip abduction 22.0 20.2  Hip adduction    Hip internal rotation    Hip external rotation    Knee flexion    Knee extension 32.1 32.0  Ankle dorsiflexion    Ankle plantarflexion    Ankle inversion    Ankle eversion     (Blank rows = not tested) Item Test date: 10/02/23 Test date:  Test date:   Sitting to standing 3. able to stand  independently using hands Insert OPRCBERGREEVAL SmartPhrase at re-test date Insert OPRCBERGREEVAL SmartPhrase at re-test date  2. Standing unsupported 4. able to stand safely for 2 minutes    3. Sitting with back unsupported, feet supported 4. able to sit safely and securely for 2 minutes    4. Standing to sitting 3. controls descent by using hands    5. Pivot transfer  3. able to transfer safely with definite need of hands    6. Standing unsupported  with eyes closed 3. able to stand 10 seconds with supervision    7. Standing unsupported with feet together 4. able to place feet together independently and stand 1 minute safely    8. Reaching forward with outstretched arms while standing 3. can reach forward 12 cm (5 inches)    9. Pick up object from the floor from standing 4. able to pick up slipper safely and easily    10. Turning to look behind over left and right shoulders while standing 4. looks behind from both sides and weight shifts well    11. Turn 360 degrees 2. able to turn 360 degrees safely but slowly    12. Place alternate foot on step or stool while standing unsupported 2. able to complete 4 steps without aid with supervision    13. Standing unsupported one foot in front 3. able to place foot ahead independently and hold 30 seconds    14. Standing on one leg 3. able to lift leg independently and hold 5-10 seconds     Total Score 45/56 Total Score /56 Total Score /56     FUNCTIONAL TESTS:  5 times sit to stand: 12.60 pool bench Timed up and go (TUG): 11.42  GAIT: Distance walked: 400 Assistive device utilized: None Level of assistance: Complete Independence Comments: Guarded trunk positioning with min arm swing, slightly shortened step length   TREATMENT  [facility] Adult PT Treatment:                                             Date: 10/17/23 Pt seen for aquatic therapy today.  Treatment took place in water 3.5-4.75 ft in depth at the [facility] pool. Temp of water was 91.  Pt entered/exited the pool via stairs independently with bilat rail.   - UE on barbell walking forward/ backward - cues for vertical trunk and even step length -Side stepping 4.2 ft.  Pt edu on hydrostatic pressure. - suitcase carry with walking forward/ backward with single/ bilat rainbow hand floats at side -L stretch - UE on yellow hand floats: toe/heel raises x 10; hip add/abd x 10; hip flex/ext x 10; shoulder horiz abdct/addct  - walking  unsupported forward/ backward with reciprocal arm swing   Pt requires the buoyancy and hydrostatic pressure of water for support, and to offload joints by unweighting joint load by at least 50 % in navel deep water and by at least 75-80% in chest to neck deep water.  Viscosity of the water is needed for resistance of strengthening. Water current perturbations provides challenge to standing balance requiring increased core activation.                                               PATIENT EDUCATION:  Education details: Objective findings, POC, rationale  Person educated: Patient Education method: Explanation Education comprehension: verbalized understanding  HOME EXERCISE PROGRAM: TBA  ASSESSMENT:  CLINICAL IMPRESSION: Pt reports good toleration to last session.  No excessive fatigue or pain. Progressed into more core engagement moving exercises off wall and using HB for support.  Tolerated submersion up to 80% without dizziness or orthostatic issues (POTS). No dizziness today. She has good toleration. Goals ongoing.     From initial evaluation:  Patient is a 58 y.o. f who was seen today for physical therapy evaluation and treatment for POTS; EDS. Pt presents today by herself.  She reports high levels of pain due to EDS, chronic fatigue and chronic pain syndrome.  She has had multiple falls due to EDS and dysautonomia.  She has been seen by multiple MD's and therapists over the last few months and feels as though all of her issues are not being properly addressed.  Her goals here today are to be instructed on an aquatic exercise program that she can progress/regress, dependant on how she is feeling on her own.  She has had aquatic therapy before and states it was helpful. She is having a good day today. Her strength testing demonstrate weakness in hip abd and knee extensors and balance not particularly challenged as per Lars but her ABC Scale states she is 93% limited. She also reports her  QOL is poor.  She will benefit from skilled PT intervention in aquatics to improve all areas of deficit as well as QOL  OBJECTIVE IMPAIRMENTS: decreased activity tolerance, decreased balance, decreased mobility, difficulty walking, decreased strength, impaired flexibility, and pain.   ACTIVITY LIMITATIONS: carrying, lifting, squatting, and locomotion level  PARTICIPATION LIMITATIONS: meal prep, cleaning, laundry, community activity, occupation, and yard work  PERSONAL FACTORS: Past/current experiences, Time since onset of injury/illness/exacerbation, and 3+ comorbidities: see Pmhx are also affecting patient's functional outcome.   REHAB POTENTIAL: Good  CLINICAL DECISION MAKING: Evolving/moderate complexity  EVALUATION COMPLEXITY: High   GOALS: Goals reviewed with patient? Yes  SHORT TERM GOALS: Target date: 10/19/23  Pt will tolerate full aquatic sessions consistently without increase in pain and with improving function to demonstrate good toleration and effectiveness of intervention.  Baseline: Goal status: INITIAL  2.  Pt will report reduction of general pain while submerged by 50% Baseline:  Goal status: INITIAL    LONG TERM GOALS: Target date: 12/01/23  Pt will improve on ABC Scale by at least 6% to demonstration statistically significant improvement. Baseline: 150/160=9.3% Goal status: INITIAL  2.  Pt will be indep with final Aquatic HEP for continued management of condition Baseline:  Goal status: INITIAL  3.  Pt will report normal pain to be reduced by 2 NPRS. Baseline: see chart Goal status: INITIAL  4.  Pt will improve on Berg balance test to >/= 50/56  (5pt MDC) to demonstrate a decrease in fall risk. Baseline: 45/56 Goal status: INITIAL  5.  Pt will improve on 5 X STS test to <or=10s (MDC2.3s) to demonstrate improving functional lower extremity strength, transitional movements, and balance Baseline: 12.60 Goal status: INITIAL   PLAN:  PT FREQUENCY:  1-2x/week  PT DURATION: 8 weeks  PLANNED INTERVENTIONS: 97750- Physical Performance Testing, 97110-Therapeutic exercises, 97530- Therapeutic activity, [id number]- Neuromuscular re-education, 97535- Self Care, [id number]- Manual therapy, [id number]- Gait training, [phone]- Aquatic Therapy,  [phone] (1-2 muscles), [phone] (3+ muscles)- Dry Needling, Patient/Family education, Balance training, Stair training, Taping, Joint mobilization, Cryotherapy, and Moist heat.  PLAN FOR NEXT SESSION:  Aquatic: pain management; general strengthening, balance and proprioception re training and aerobic capacity .  Ronal Belmont) Jarrah Seher MPT 10/17/23 3:59 PM [facility] [address] [place], [place], [address] Phone: [phone]   Fax:  [phone]      For all possible CPT codes, reference the Planned Interventions line above.     Check all conditions that are expected to impact treatment: {Conditions expected to impact treatment:Musculoskeletal disorders   If treatment provided at initial evaluation, no treatment charged due to lack of authorization.

## 2023-10-19 ENCOUNTER — Ambulatory Visit: Admit: 2023-10-19 | Admitting: Obstetrics and Gynecology

## 2023-10-19 SURGERY — HYSTERECTOMY, SUPRACERVICAL, LAPAROSCOPIC
Anesthesia: General | Laterality: Bilateral

## 2023-10-20 ENCOUNTER — Encounter (HOSPITAL_BASED_OUTPATIENT_CLINIC_OR_DEPARTMENT_OTHER): Payer: Self-pay | Admitting: Family Medicine

## 2023-10-23 ENCOUNTER — Other Ambulatory Visit (HOSPITAL_COMMUNITY): Payer: Self-pay

## 2023-10-24 ENCOUNTER — Other Ambulatory Visit: Payer: Self-pay

## 2023-10-24 ENCOUNTER — Ambulatory Visit (HOSPITAL_BASED_OUTPATIENT_CLINIC_OR_DEPARTMENT_OTHER): Admitting: Physical Therapy

## 2023-10-24 ENCOUNTER — Other Ambulatory Visit (HOSPITAL_BASED_OUTPATIENT_CLINIC_OR_DEPARTMENT_OTHER): Payer: Self-pay

## 2023-10-24 ENCOUNTER — Other Ambulatory Visit (HOSPITAL_COMMUNITY): Payer: Self-pay

## 2023-10-24 ENCOUNTER — Ambulatory Visit: Admitting: Family Medicine

## 2023-10-24 ENCOUNTER — Encounter (HOSPITAL_BASED_OUTPATIENT_CLINIC_OR_DEPARTMENT_OTHER): Payer: Self-pay | Admitting: Physical Therapy

## 2023-10-24 ENCOUNTER — Other Ambulatory Visit (HOSPITAL_BASED_OUTPATIENT_CLINIC_OR_DEPARTMENT_OTHER): Payer: Self-pay | Admitting: *Deleted

## 2023-10-24 VITALS — BP 102/80 | HR 88 | Ht 66.0 in | Wt 132.0 lb

## 2023-10-24 DIAGNOSIS — M79641 Pain in right hand: Secondary | ICD-10-CM

## 2023-10-24 DIAGNOSIS — M6281 Muscle weakness (generalized): Secondary | ICD-10-CM | POA: Diagnosis not present

## 2023-10-24 DIAGNOSIS — G8929 Other chronic pain: Secondary | ICD-10-CM

## 2023-10-24 DIAGNOSIS — R278 Other lack of coordination: Secondary | ICD-10-CM

## 2023-10-24 DIAGNOSIS — M25511 Pain in right shoulder: Secondary | ICD-10-CM

## 2023-10-24 DIAGNOSIS — M2550 Pain in unspecified joint: Secondary | ICD-10-CM

## 2023-10-24 DIAGNOSIS — M5416 Radiculopathy, lumbar region: Secondary | ICD-10-CM

## 2023-10-24 DIAGNOSIS — M25561 Pain in right knee: Secondary | ICD-10-CM

## 2023-10-24 DIAGNOSIS — M357 Hypermobility syndrome: Secondary | ICD-10-CM | POA: Diagnosis not present

## 2023-10-24 DIAGNOSIS — M545 Low back pain, unspecified: Secondary | ICD-10-CM

## 2023-10-24 DIAGNOSIS — M797 Fibromyalgia: Secondary | ICD-10-CM

## 2023-10-24 DIAGNOSIS — G894 Chronic pain syndrome: Secondary | ICD-10-CM

## 2023-10-24 DIAGNOSIS — M359 Systemic involvement of connective tissue, unspecified: Secondary | ICD-10-CM

## 2023-10-24 LAB — VITAMIN D 25 HYDROXY (VIT D DEFICIENCY, FRACTURES): VITD: 32.74 ng/mL (ref 30.00–100.00)

## 2023-10-24 LAB — URIC ACID: Uric Acid, Serum: 3.4 mg/dL (ref 2.4–7.0)

## 2023-10-24 LAB — VITAMIN B12: Vitamin B-12: 341 pg/mL (ref 211–911)

## 2023-10-24 LAB — IBC PANEL
Iron: 129 ug/dL (ref 42–145)
Saturation Ratios: 45.8 % (ref 20.0–50.0)
TIBC: 281.4 ug/dL (ref 250.0–450.0)
Transferrin: 201 mg/dL — ABNORMAL LOW (ref 212.0–360.0)

## 2023-10-24 LAB — FERRITIN: Ferritin: 70.9 ng/mL (ref 10.0–291.0)

## 2023-10-24 LAB — FOLATE: Folate: 12.8 ng/mL (ref >5.9)

## 2023-10-24 LAB — TSH: TSH: 2.98 u[IU]/mL (ref 0.35–5.50)

## 2023-10-24 MED ORDER — SERTRALINE HCL 50 MG PO TABS
25.0000 mg | ORAL_TABLET | Freq: Every day | ORAL | 1 refills | Status: DC
  Filled 2023-10-24: qty 30, 30d supply, fill #0

## 2023-10-24 MED ORDER — DULOXETINE HCL 20 MG PO CPEP
20.0000 mg | ORAL_CAPSULE | Freq: Every day | ORAL | 0 refills | Status: DC
  Filled 2023-10-24 (×2): qty 30, 30d supply, fill #0

## 2023-10-24 NOTE — Assessment & Plan Note (Signed)
 Patient is quite complicated.  Does have some hypermobility but also only arthritis that makes it very difficult to test secondary to some guarding aspect.  Patient has seen multiple different providers for this over the course of time as well as has had multiple different treatments.  Discussed with patient at a great length that the osteopathic manipulation that would go could be too aggressive for this individual.  We decided to hold on that at this time.  We discussed with patient about medications and we did decide to discontinue the amitriptyline  and start the duloxetine .  20 mg.  Can titrate up if it is seem to be helping.  Patient is taking Celebrex  that was given to her by the primary care provider.  We will continue to watch that and may have room to increase if needed.  Discussed vitamin supplementations that could be helpful for her as well and laboratory workup to make sure nothing else is potentially contributing such as different deficiencies.  Patient can follow-up with me again in 2 months.  Total time reviewing patient's chart and discussing with patient 49 minutes

## 2023-10-24 NOTE — Therapy (Signed)
 OUTPATIENT PHYSICAL THERAPY THORACOLUMBAR TREATMENT   Patient Name: Lindsey [name] MRN: [id number] DOB:1966-03-23, 58 y.o., female Today's Date: 10/24/2023  END OF SESSION:  PT End of Session - 10/24/23 1536     Visit Number 4    Number of Visits 12    Date for PT Re-Evaluation 12/01/23    Authorization Type mcaid    PT Start Time 1532    PT Stop Time 1610    PT Time Calculation (min) 38 min    Behavior During Therapy [facility] for tasks assessed/performed           Past Medical History:  Diagnosis Date   Allergy     Angio-edema    Anxiety 09/18/2022   Arthritis    Asthma    Autoimmune disease (HCC) 09/08/2023   Cancer (HCC) 10/25/2021   Carpal tunnel syndrome 07/07/2015   right   Chronic cough 12/02/2015   Chronic pain syndrome 08/10/2016   CVID (common variable immunodeficiency) (HCC)    Depression 09/18/2022   Ehlers-Danlos syndrome    Essential alopecia of women 04/14/2011   Family history of breast cancer in female 08/26/2015   (x2) paternal 1st cousins dx in their 60s    Fibrocystic breast changes of both breasts 07/19/2015   Fibromyalgia    HLD (hyperlipidemia)    Hypermobility syndrome 11/26/2014   Based on Fam Hx and Beighton Score of 7 this is likely ED III    Hypertrophy of vulva 07/20/2017   Inflammatory dermatosis 04/14/2011   Irritable larynx syndrome 12/02/2015   Lumbar radiculopathy 02/16/2016   MGUS (monoclonal gammopathy of unknown significance)    Osteoarthritis 04/01/2017   Osteopenia    Osteoporosis 12/05/2017   Primary arthrosis of first carpometacarpal joints, bilateral 06/01/2015   Sacroiliac joint dysfunction of right side 11/26/2014   B SI joint xrays last done at [facility] Dr. Curt 03/13/2017   Skin cancer    Past Surgical History:  Procedure Laterality Date   BREAST EXCISIONAL BIOPSY Left 01/2019   atypical lobular hyperplasia   BREAST SURGERY  02/05/2019   Atypical Lobular Hyperplasia   CARPAL TUNNEL RELEASE Right  06/2020   COLONOSCOPY     DENTAL SURGERY     ELBOW FRACTURE SURGERY Left    as child   EYE SURGERY  2005   LASIK   FRACTURE SURGERY  1974   L elbow break   JOINT REPLACEMENT  07/07/2020   first metacarpal joint R hand   RADIOACTIVE SEED GUIDED EXCISIONAL BREAST BIOPSY Left 02/05/2019   Procedure: RADIOACTIVE SEED GUIDED EXCISIONAL LEFT BREAST BIOPSY;  Surgeon: Ebbie Cough, MD;  Location: [facility];  Service: General;  Laterality: Left;   SPINE SURGERY  2019   RFA, [facility]wood Boor; [facility]   Patient Active Problem List   Diagnosis Date Noted   Acne vulgaris 09/08/2023   Allergic shiners 09/08/2023   Autoimmune disease (HCC) 09/08/2023   Disorder of connective tissue (HCC) 09/08/2023   Lentigo 09/08/2023   Squamous cell carcinoma in situ of skin of abdomen 09/08/2023   Monoclonal MCAS (HCC) 08/25/2023   Strain of muscle(s) and tendon(s) of the rotator cuff of right shoulder, subsequent encounter 08/25/2023   Pain in joint of right shoulder 07/07/2023   Tear of lateral meniscus of knee 07/07/2023   Other allergic rhinitis 06/05/2023   Moderate persistent asthma, uncomplicated 06/05/2023   Assistance needed with transportation 02/13/2023   Impaired mobility and ADLs 02/13/2023   Dyssynergic defecation 12/13/2022   Postural  dizziness with presyncope 11/05/2022   Auditory processing disorder 09/22/2022   Anxiety 09/18/2022   Depression 09/18/2022   Panic attack due to post traumatic stress disorder (PTSD) 09/18/2022   History of penicillin allergy  08/29/2022   Muscle weakness (generalized) 06/30/2022   Arthralgia of right knee 06/14/2022   At high risk for breast cancer 04/19/2022   Allergic rhinitis due to pollen 04/18/2022   Chronic fatigue syndrome 02/03/2022   Common variable immunodeficiency, unspecified (HCC) 12/02/2021   Postural orthostatic tachycardia syndrome (POTS) 11/25/2021   Vasovagal syncope 11/25/2021   MGUS (monoclonal  gammopathy of unknown significance) 10/25/2021   Smoldering multiple myeloma (SMM) 10/25/2021   Acquired primary hypogammaglobulinemia (HCC) 09/10/2021   Carpal tunnel syndrome of right wrist 03/11/2021   Encounter for orthopedic follow-up care 07/07/2020   Pain in right hand 06/03/2020   Gluten intolerance 04/11/2020   Non-restorative sleep 01/14/2020   Menopausal and female climacteric states 01/14/2020   Insomnia due to medical condition 01/14/2020   Asthma 06/18/2019   Migraine 06/18/2019   Osteopenia of multiple sites 06/18/2019   Chronic right-sided low back pain 02/22/2018   Hypertrophy of vulva 07/20/2017   Osteoarthritis 04/01/2017   Fibromyalgia 08/10/2016   Chronic pain syndrome 08/10/2016   Lumbar radiculopathy 02/16/2016   Hearing impairment 12/04/2015   Chronic cough 12/02/2015   Irritable larynx syndrome 12/02/2015   Genetic testing 09/21/2015   Family history of breast cancer in female 08/26/2015   Fibrocystic breast changes of both breasts 07/19/2015   Carpal tunnel syndrome 07/07/2015   Primary arthrosis of first carpometacarpal joints, bilateral 06/01/2015   Ehlers-Danlos syndrome 05/11/2015   Sacroiliac joint dysfunction of right side 11/26/2014   Hypermobility syndrome 11/26/2014   Essential alopecia of women 04/14/2011   Inflammatory dermatosis 04/14/2011   Post-traumatic stress disorder, chronic 05/01/2007   Adjustment disorder with mixed anxiety and depressed mood 12/01/2006   Irritable bowel syndrome with both constipation and diarrhea 04/11/1998    PCP: Betty Swaziland MD  REFERRING PROVIDER: [name], Raymond J, MD   REFERRING DIAG: G90.A (ICD-10-CM) - Postural orthostatic tachycardia syndrome (POTS)   Rationale for Evaluation and Treatment: Rehabilitation  THERAPY DIAG:  Muscle weakness (generalized)  Other chronic pain  Other lack of coordination  ONSET DATE: Chronic  SUBJECTIVE:                                                                                                                                                                                            SUBJECTIVE STATEMENT: Pt reports she had her iGg infusion today prior to session (for her immune system). She reports good tolerance to the aquatic  therapy session.   POOL ACCESS: currently none, but plans to join [facility].  From initial evaluation:  I have had many different doctors am being treated by multiple PT's.  I have multiple different issues.  Having trouble getting the treatment I need. Pain general everywhere.  Already doing the right start program here at [facility]. End goal is to get a membership here for access to warm pool.  PERTINENT HISTORY:  Evaluate and Treat. 1-2 times per week for 4-6 weeks. Decrease pain, increase strength, flexibility, function, and range of motion. Modalities may include, traction, ionto, phono, and stim. May include dry needling with or without stim.  EDS; chronic pain syndrome; fibromyalgia; PTSD;dysautonomia   PAIN:  Are you having pain? Yes: NPRS scale: 6/10 Pain location: general Pain description: dull ache, sharp Aggravating factors: movement Relieving factors: movement  PRECAUTIONS: None  RED FLAGS: None   WEIGHT BEARING RESTRICTIONS: No  FALLS:  Has patient fallen in last 6 months? Yes. Number of falls 3 POTS  LIVING ENVIRONMENT: Lives with: lives alone Lives in: House/apartment   PLOF: Independent  PATIENT GOALS: progressive exercises to work on.    NEXT MD VISIT:   OBJECTIVE:  Note: Objective measures were completed at Evaluation unless otherwise noted.    PATIENT SURVEYS:  The Activities-Specific Balance Confidence (ABC) Scale 0% 10 20 30  40 50 60 70 80 90 100% No confidence<->completely confident  "How confident are you that you will not lose your balance or become unsteady when you . . .   Date tested 10/02/23  Walk around the house 10%  2. Walk up or down stairs 10%  3. Bend over  and pick up a slipper from in front of a closet floor 10%  4. Reach for a small can off a shelf at eye level 20%  5. Stand on tip toes and reach for something above your head 20%  6. Stand on a chair and reach for something 0%  7. Sweep the floor 10%  8. Walk outside the house to a car parked in the driveway 10%  9. Get into or out of a car 10%  10. Walk across a parking lot to the mall 30%  11. Walk up or down a ramp 10%  12. Walk in a crowded mall where people rapidly walk past you 0%  13. Are bumped into by people as you walk through the mall 0%  14. Step onto or off of an escalator while you are holding onto the railing 0%  15. Step onto or off an escalator while holding onto parcels such that you cannot hold onto the railing 0%  16. Walk outside on icy sidewalks 10%  Total: #/16 150/16=9.3%     COGNITION: Overall cognitive status: Within functional limits for tasks assessed     SENSATION: WFL   POSTURE: No Significant postural limitations   LUMBAR ROM:  Full/hypermobile  LOWER EXTREMITY ROM:    Full/hypermobile  LOWER EXTREMITY STRENGTH:    HD (Lbs) Right eval Left eval  Hip flexion 35.0 35.4  Hip extension    Hip abduction 22.0 20.2  Hip adduction    Hip internal rotation    Hip external rotation    Knee flexion    Knee extension 32.1 32.0  Ankle dorsiflexion    Ankle plantarflexion    Ankle inversion    Ankle eversion     (Blank rows = not tested) Item Test date: 10/02/23 Test date:  Test date:   Sitting to  standing 3. able to stand independently using hands Insert OPRCBERGREEVAL SmartPhrase at re-test date Insert OPRCBERGREEVAL SmartPhrase at re-test date  2. Standing unsupported 4. able to stand safely for 2 minutes    3. Sitting with back unsupported, feet supported 4. able to sit safely and securely for 2 minutes    4. Standing to sitting 3. controls descent by using hands    5. Pivot transfer  3. able to transfer safely with definite need of hands     6. Standing unsupported with eyes closed 3. able to stand 10 seconds with supervision    7. Standing unsupported with feet together 4. able to place feet together independently and stand 1 minute safely    8. Reaching forward with outstretched arms while standing 3. can reach forward 12 cm (5 inches)    9. Pick up object from the floor from standing 4. able to pick up slipper safely and easily    10. Turning to look behind over left and right shoulders while standing 4. looks behind from both sides and weight shifts well    11. Turn 360 degrees 2. able to turn 360 degrees safely but slowly    12. Place alternate foot on step or stool while standing unsupported 2. able to complete 4 steps without aid with supervision    13. Standing unsupported one foot in front 3. able to place foot ahead independently and hold 30 seconds    14. Standing on one leg 3. able to lift leg independently and hold 5-10 seconds     Total Score 45/56 Total Score /56 Total Score /56     FUNCTIONAL TESTS:  5 times sit to stand: 12.60 pool bench Timed up and go (TUG): 11.42  GAIT: Distance walked: 400 Assistive device utilized: None Level of assistance: Complete Independence Comments: Guarded trunk positioning with min arm swing, slightly shortened step length   TREATMENT  [facility] Adult PT Treatment:                                             Date: 10/24/23 Pt seen for aquatic therapy today.  Treatment took place in water 3.5-4.75 ft in depth at the [facility] pool. Temp of water was 91.  Pt entered/exited the pool via stairs independently with bilat rail.   - UE on barbell walking forward/ backward - cues for vertical trunk and even step length -Side stepping with arm horz abdct/add - suitcase carry with walking forward/ backward with single/ bilat rainbow hand floats at side -L stretch - UE on yellow hand floats: toe/heel raises x 10; hip add/abd x 10; hip flex/ext x 10; tandem gait forward/  backward   - TrA set with half hollow noodle pull down to thighs -> full hollow noodle pull down x 10-> staggered stance x 5 each  -standard stance with shoulder abdct/add with rainbow hand floats x 10;  - staggered stance with tricep press downs with rainbow hand floats x 10 each - walking forward/ backward with row motion with rainbow hand floats -> with kick board   Pt requires the buoyancy and hydrostatic pressure of water for support, and to offload joints by unweighting joint load by at least 50 % in navel deep water and by at least 75-80% in chest to neck deep water.  Viscosity of the water is needed for resistance of strengthening.  Water current perturbations provides challenge to standing balance requiring increased core activation.                                               PATIENT EDUCATION:  Education details: intro to aquatic therapy  Person educated: Patient Education method: Explanation Education comprehension: verbalized understanding  HOME EXERCISE PROGRAM: TBA  ASSESSMENT:  CLINICAL IMPRESSION: Pt reports good toleration to last session. Pt able to increase volume of exercise without difficulty or increase in symptoms.  Pt reports 20% improvement in pain level once submerged.  Pt has met STG#1.       From initial evaluation:  Patient is a 58 y.o. f who was seen today for physical therapy evaluation and treatment for POTS; EDS. Pt presents today by herself.  She reports high levels of pain due to EDS, chronic fatigue and chronic pain syndrome.  She has had multiple falls due to EDS and dysautonomia.  She has been seen by multiple MD's and therapists over the last few months and feels as though all of her issues are not being properly addressed.  Her goals here today are to be instructed on an aquatic exercise program that she can progress/regress, dependant on how she is feeling on her own.  She has had aquatic therapy before and states it was helpful. She is having  a good day today. Her strength testing demonstrate weakness in hip abd and knee extensors and balance not particularly challenged as per Lars but her ABC Scale states she is 93% limited. She also reports her QOL is poor.  She will benefit from skilled PT intervention in aquatics to improve all areas of deficit as well as QOL  OBJECTIVE IMPAIRMENTS: decreased activity tolerance, decreased balance, decreased mobility, difficulty walking, decreased strength, impaired flexibility, and pain.   ACTIVITY LIMITATIONS: carrying, lifting, squatting, and locomotion level  PARTICIPATION LIMITATIONS: meal prep, cleaning, laundry, community activity, occupation, and yard work  PERSONAL FACTORS: Past/current experiences, Time since onset of injury/illness/exacerbation, and 3+ comorbidities: see Pmhx are also affecting patient's functional outcome.   REHAB POTENTIAL: Good  CLINICAL DECISION MAKING: Evolving/moderate complexity  EVALUATION COMPLEXITY: High   GOALS: Goals reviewed with patient? Yes  SHORT TERM GOALS: Target date: 10/19/23  Pt will tolerate full aquatic sessions consistently without increase in pain and with improving function to demonstrate good toleration and effectiveness of intervention.  Baseline: Goal status: MET 10/24/23  2.  Pt will report reduction of general pain while submerged by 50% Baseline:  Goal status: IN PROGRESS -10/24/23    LONG TERM GOALS: Target date: 12/01/23  Pt will improve on ABC Scale by at least 6% to demonstration statistically significant improvement. Baseline: 150/160=9.3% Goal status: INITIAL  2.  Pt will be indep with final Aquatic HEP for continued management of condition Baseline:  Goal status: INITIAL  3.  Pt will report normal pain to be reduced by 2 NPRS. Baseline: see chart Goal status: INITIAL  4.  Pt will improve on Berg balance test to >/= 50/56  (5pt MDC) to demonstrate a decrease in fall risk. Baseline: 45/56 Goal status:  INITIAL  5.  Pt will improve on 5 X STS test to <or=10s (MDC2.3s) to demonstrate improving functional lower extremity strength, transitional movements, and balance Baseline: 12.60 Goal status: INITIAL   PLAN:  PT FREQUENCY: 1-2x/week  PT DURATION: 8 weeks  PLANNED INTERVENTIONS: 97750- Physical Performance Testing, 97110-Therapeutic exercises, 97530- Therapeutic activity, [id number]- Neuromuscular re-education, 97535- Self Care, 02859- Manual therapy, [phone]- Gait training, [phone]- Aquatic Therapy,  [phone] (1-2 muscles), [phone] (3+ muscles)- Dry Needling, Patient/Family education, Balance training, Stair training, Taping, Joint mobilization, Cryotherapy, and Moist heat.  PLAN FOR NEXT SESSION: Aquatic: pain management; general strengthening, balance and proprioception re training and aerobic capacity .   Delon Aquas, PTA 10/24/23 5:21 PM [facility] [address] [place], [place], [address] Phone: [phone]   Fax:  [phone]    For all possible CPT codes, reference the Planned Interventions line above.     Check all conditions that are expected to impact treatment: {Conditions expected to impact treatment:Musculoskeletal disorders   If treatment provided at initial evaluation, no treatment charged due to lack of authorization.

## 2023-10-24 NOTE — Patient Instructions (Addendum)
 Cymbalta  20mg  stop amitriptyline  Vit C 1000mg  CoQ10 100mg  daily Labs today See me in 2-3 months

## 2023-10-25 ENCOUNTER — Ambulatory Visit: Payer: Self-pay | Admitting: Family Medicine

## 2023-10-25 LAB — HOMOCYSTEINE: Homocysteine: 16.2 umol/L — ABNORMAL HIGH (ref <=13.4)

## 2023-10-26 ENCOUNTER — Other Ambulatory Visit: Payer: Self-pay

## 2023-10-26 ENCOUNTER — Other Ambulatory Visit (HOSPITAL_COMMUNITY): Payer: Self-pay

## 2023-10-27 ENCOUNTER — Other Ambulatory Visit (HOSPITAL_BASED_OUTPATIENT_CLINIC_OR_DEPARTMENT_OTHER): Payer: Self-pay

## 2023-10-30 ENCOUNTER — Other Ambulatory Visit (HOSPITAL_COMMUNITY): Payer: Self-pay

## 2023-10-30 ENCOUNTER — Other Ambulatory Visit: Payer: Self-pay

## 2023-10-30 ENCOUNTER — Ambulatory Visit: Admitting: Internal Medicine

## 2023-10-30 ENCOUNTER — Other Ambulatory Visit (HOSPITAL_BASED_OUTPATIENT_CLINIC_OR_DEPARTMENT_OTHER): Payer: Self-pay

## 2023-10-31 ENCOUNTER — Encounter (HOSPITAL_BASED_OUTPATIENT_CLINIC_OR_DEPARTMENT_OTHER): Payer: Self-pay | Admitting: Family Medicine

## 2023-11-01 ENCOUNTER — Other Ambulatory Visit (HOSPITAL_BASED_OUTPATIENT_CLINIC_OR_DEPARTMENT_OTHER): Payer: Self-pay

## 2023-11-02 ENCOUNTER — Encounter (HOSPITAL_BASED_OUTPATIENT_CLINIC_OR_DEPARTMENT_OTHER): Payer: Self-pay | Admitting: Physical Therapy

## 2023-11-02 ENCOUNTER — Ambulatory Visit (HOSPITAL_BASED_OUTPATIENT_CLINIC_OR_DEPARTMENT_OTHER): Admitting: Physical Therapy

## 2023-11-02 DIAGNOSIS — G8929 Other chronic pain: Secondary | ICD-10-CM

## 2023-11-02 DIAGNOSIS — M6281 Muscle weakness (generalized): Secondary | ICD-10-CM

## 2023-11-02 DIAGNOSIS — R278 Other lack of coordination: Secondary | ICD-10-CM

## 2023-11-02 NOTE — Therapy (Signed)
 OUTPATIENT PHYSICAL THERAPY THORACOLUMBAR TREATMENT   Patient Name: Lindsey [name] MRN: [id number] DOB:1965/04/19, 58 y.o., female Today's Date: 11/02/2023  END OF SESSION:  PT End of Session - 11/02/23 1550     Visit Number 5    Number of Visits 12    Date for PT Re-Evaluation 12/01/23    Authorization Type mcaid    PT Start Time 1535    PT Stop Time 1615    PT Time Calculation (min) 40 min    Activity Tolerance Patient tolerated treatment well    Behavior During Therapy [facility] for tasks assessed/performed            Past Medical History:  Diagnosis Date   Allergy     Angio-edema    Anxiety 09/18/2022   Arthritis    Asthma    Autoimmune disease (HCC) 09/08/2023   Cancer (HCC) 10/25/2021   Carpal tunnel syndrome 07/07/2015   right   Chronic cough 12/02/2015   Chronic pain syndrome 08/10/2016   CVID (common variable immunodeficiency) (HCC)    Depression 09/18/2022   Ehlers-Danlos syndrome    Essential alopecia of women 04/14/2011   Family history of breast cancer in female 08/26/2015   (x2) paternal 1st cousins dx in their 43s    Fibrocystic breast changes of both breasts 07/19/2015   Fibromyalgia    HLD (hyperlipidemia)    Hypermobility syndrome 11/26/2014   Based on Fam Hx and Beighton Score of 7 this is likely ED III    Hypertrophy of vulva 07/20/2017   Inflammatory dermatosis 04/14/2011   Irritable larynx syndrome 12/02/2015   Lumbar radiculopathy 02/16/2016   MGUS (monoclonal gammopathy of unknown significance)    Osteoarthritis 04/01/2017   Osteopenia    Osteoporosis 12/05/2017   Primary arthrosis of first carpometacarpal joints, bilateral 06/01/2015   Sacroiliac joint dysfunction of right side 11/26/2014   B SI joint xrays last done at [facility] Dr. Curt 03/13/2017   Skin cancer    Past Surgical History:  Procedure Laterality Date   BREAST EXCISIONAL BIOPSY Left 01/2019   atypical lobular hyperplasia   BREAST SURGERY  02/05/2019   Atypical  Lobular Hyperplasia   CARPAL TUNNEL RELEASE Right 06/2020   COLONOSCOPY     DENTAL SURGERY     ELBOW FRACTURE SURGERY Left    as child   EYE SURGERY  2005   LASIK   FRACTURE SURGERY  1974   L elbow break   JOINT REPLACEMENT  07/07/2020   first metacarpal joint R hand   RADIOACTIVE SEED GUIDED EXCISIONAL BREAST BIOPSY Left 02/05/2019   Procedure: RADIOACTIVE SEED GUIDED EXCISIONAL LEFT BREAST BIOPSY;  Surgeon: Ebbie Cough, MD;  Location: [name];  Service: General;  Laterality: Left;   SPINE SURGERY  2019   RFA, Lynwood Boor; [facility]   Patient Active Problem List   Diagnosis Date Noted   Acne vulgaris 09/08/2023   Allergic shiners 09/08/2023   Autoimmune disease (HCC) 09/08/2023   Disorder of connective tissue (HCC) 09/08/2023   Lentigo 09/08/2023   Squamous cell carcinoma in situ of skin of abdomen 09/08/2023   Monoclonal MCAS (HCC) 08/25/2023   Strain of muscle(s) and tendon(s) of the rotator cuff of right shoulder, subsequent encounter 08/25/2023   Pain in joint of right shoulder 07/07/2023   Tear of lateral meniscus of knee 07/07/2023   Other allergic rhinitis 06/05/2023   Moderate persistent asthma, uncomplicated 06/05/2023   Assistance needed with transportation 02/13/2023   Impaired mobility and  ADLs 02/13/2023   Dyssynergic defecation 12/13/2022   Postural dizziness with presyncope 11/05/2022   Auditory processing disorder 09/22/2022   Anxiety 09/18/2022   Depression 09/18/2022   Panic attack due to post traumatic stress disorder (PTSD) 09/18/2022   History of penicillin allergy  08/29/2022   Muscle weakness (generalized) 06/30/2022   Arthralgia of right knee 06/14/2022   At high risk for breast cancer 04/19/2022   Allergic rhinitis due to pollen 04/18/2022   Chronic fatigue syndrome 02/03/2022   Common variable immunodeficiency, unspecified (HCC) 12/02/2021   Postural orthostatic tachycardia syndrome (POTS) 11/25/2021    Vasovagal syncope 11/25/2021   MGUS (monoclonal gammopathy of unknown significance) 10/25/2021   Smoldering multiple myeloma (SMM) 10/25/2021   Acquired primary hypogammaglobulinemia (HCC) 09/10/2021   Carpal tunnel syndrome of right wrist 03/11/2021   Encounter for orthopedic follow-up care 07/07/2020   Pain in right hand 06/03/2020   Gluten intolerance 04/11/2020   Non-restorative sleep 01/14/2020   Menopausal and female climacteric states 01/14/2020   Insomnia due to medical condition 01/14/2020   Asthma 06/18/2019   Migraine 06/18/2019   Osteopenia of multiple sites 06/18/2019   Chronic right-sided low back pain 02/22/2018   Hypertrophy of vulva 07/20/2017   Osteoarthritis 04/01/2017   Fibromyalgia 08/10/2016   Chronic pain syndrome 08/10/2016   Lumbar radiculopathy 02/16/2016   Hearing impairment 12/04/2015   Chronic cough 12/02/2015   Irritable larynx syndrome 12/02/2015   Genetic testing 09/21/2015   Family history of breast cancer in female 08/26/2015   Fibrocystic breast changes of both breasts 07/19/2015   Carpal tunnel syndrome 07/07/2015   Primary arthrosis of first carpometacarpal joints, bilateral 06/01/2015   Ehlers-Danlos syndrome 05/11/2015   Sacroiliac joint dysfunction of right side 11/26/2014   Hypermobility syndrome 11/26/2014   Essential alopecia of women 04/14/2011   Inflammatory dermatosis 04/14/2011   Post-traumatic stress disorder, chronic 05/01/2007   Adjustment disorder with mixed anxiety and depressed mood 12/01/2006   Irritable bowel syndrome with both constipation and diarrhea 04/11/1998    PCP: Betty Swaziland MD  REFERRING PROVIDER: [name], Raymond J, MD   REFERRING DIAG: G90.A (ICD-10-CM) - Postural orthostatic tachycardia syndrome (POTS)   Rationale for Evaluation and Treatment: Rehabilitation  THERAPY DIAG:  Muscle weakness (generalized)  Other chronic pain  Other lack of coordination  ONSET DATE: Chronic  SUBJECTIVE:                                                                                                                                                                                            SUBJECTIVE STATEMENT: Pt reports bad week last.  Had difficulty with dizziness and some  SOB.  POOL ACCESS: currently none, but plans to join [facility].  From initial evaluation:  I have had many different doctors am being treated by multiple PT's.  I have multiple different issues.  Having trouble getting the treatment I need. Pain general everywhere.  Already doing the right start program here at [facility]. End goal is to get a membership here for access to warm pool.  PERTINENT HISTORY:  Evaluate and Treat. 1-2 times per week for 4-6 weeks. Decrease pain, increase strength, flexibility, function, and range of motion. Modalities may include, traction, ionto, phono, and stim. May include dry needling with or without stim.  EDS; chronic pain syndrome; fibromyalgia; PTSD;dysautonomia   PAIN:  Are you having pain? Yes: NPRS scale: 6/10 Pain location: general Pain description: dull ache, sharp Aggravating factors: movement Relieving factors: movement  PRECAUTIONS: None  RED FLAGS: None   WEIGHT BEARING RESTRICTIONS: No  FALLS:  Has patient fallen in last 6 months? Yes. Number of falls 3 POTS  LIVING ENVIRONMENT: Lives with: lives alone Lives in: House/apartment   PLOF: Independent  PATIENT GOALS: progressive exercises to work on.    NEXT MD VISIT:   OBJECTIVE:  Note: Objective measures were completed at Evaluation unless otherwise noted.    PATIENT SURVEYS:  The Activities-Specific Balance Confidence (ABC) Scale 0% 10 20 30  40 50 60 70 80 90 100% No confidence<->completely confident  "How confident are you that you will not lose your balance or become unsteady when you . . .   Date tested 10/02/23  Walk around the house 10%  2. Walk up or down stairs 10%  3. Bend over and pick up a slipper  from in front of a closet floor 10%  4. Reach for a small can off a shelf at eye level 20%  5. Stand on tip toes and reach for something above your head 20%  6. Stand on a chair and reach for something 0%  7. Sweep the floor 10%  8. Walk outside the house to a car parked in the driveway 10%  9. Get into or out of a car 10%  10. Walk across a parking lot to the mall 30%  11. Walk up or down a ramp 10%  12. Walk in a crowded mall where people rapidly walk past you 0%  13. Are bumped into by people as you walk through the mall 0%  14. Step onto or off of an escalator while you are holding onto the railing 0%  15. Step onto or off an escalator while holding onto parcels such that you cannot hold onto the railing 0%  16. Walk outside on icy sidewalks 10%  Total: #/16 150/16=9.3%     COGNITION: Overall cognitive status: Within functional limits for tasks assessed     SENSATION: WFL   POSTURE: No Significant postural limitations   LUMBAR ROM:  Full/hypermobile  LOWER EXTREMITY ROM:    Full/hypermobile  LOWER EXTREMITY STRENGTH:    HD (Lbs) Right eval Left eval  Hip flexion 35.0 35.4  Hip extension    Hip abduction 22.0 20.2  Hip adduction    Hip internal rotation    Hip external rotation    Knee flexion    Knee extension 32.1 32.0  Ankle dorsiflexion    Ankle plantarflexion    Ankle inversion    Ankle eversion     (Blank rows = not tested) Item Test date: 10/02/23 Test date:  Test date:   Sitting to standing 3.  able to stand independently using hands Insert OPRCBERGREEVAL SmartPhrase at re-test date Insert OPRCBERGREEVAL SmartPhrase at re-test date  2. Standing unsupported 4. able to stand safely for 2 minutes    3. Sitting with back unsupported, feet supported 4. able to sit safely and securely for 2 minutes    4. Standing to sitting 3. controls descent by using hands    5. Pivot transfer  3. able to transfer safely with definite need of hands    6. Standing  unsupported with eyes closed 3. able to stand 10 seconds with supervision    7. Standing unsupported with feet together 4. able to place feet together independently and stand 1 minute safely    8. Reaching forward with outstretched arms while standing 3. can reach forward 12 cm (5 inches)    9. Pick up object from the floor from standing 4. able to pick up slipper safely and easily    10. Turning to look behind over left and right shoulders while standing 4. looks behind from both sides and weight shifts well    11. Turn 360 degrees 2. able to turn 360 degrees safely but slowly    12. Place alternate foot on step or stool while standing unsupported 2. able to complete 4 steps without aid with supervision    13. Standing unsupported one foot in front 3. able to place foot ahead independently and hold 30 seconds    14. Standing on one leg 3. able to lift leg independently and hold 5-10 seconds     Total Score 45/56 Total Score /56 Total Score /56     FUNCTIONAL TESTS:  5 times sit to stand: 12.60 pool bench Timed up and go (TUG): 11.42  GAIT: Distance walked: 400 Assistive device utilized: None Level of assistance: Complete Independence Comments: Guarded trunk positioning with min arm swing, slightly shortened step length   TREATMENT  [facility] Adult PT Treatment:                                             Date: 11/02/23 Pt seen for aquatic therapy today.  Treatment took place in water 3.5-4.75 ft in depth at the [facility] pool. Temp of water was 91.  Pt entered/exited the pool via stairs independently with bilat rail.   - UE on barbell walking forward/ backward - cues for vertical trunk and even step length -Straddling noodle ue support corner wall: hip add/bad; flex/ext extended period of time as it reduces pain without fatigue - UE support on wall: toe/heel raises x 10; hip add/abd x 10; hip flex/ext x 10;   Pt requires the buoyancy and hydrostatic pressure of water for  support, and to offload joints by unweighting joint load by at least 50 % in navel deep water and by at least 75-80% in chest to neck deep water.  Viscosity of the water is needed for resistance of strengthening. Water current perturbations provides challenge to standing balance requiring increased core activation.                                            PATIENT EDUCATION:  Education details: intro to aquatic therapy  Person educated: Patient Education method: Explanation Education comprehension: verbalized understanding  HOME EXERCISE PROGRAM:  TBA  ASSESSMENT:  CLINICAL IMPRESSION: Pt reports flare of chronic pain and fatigue last week with residual weakness today.  She voices frustration with situation but is doing her best to stay mobile and mentally up beat through horse back riding and coming to therapy.  Her overall pain sensitivity continues to wax and wane directly related to EDS and fibromyalgia.  She will continue to benefit from aquatic therapy albeit progress is slow. Goals ongoing       From initial evaluation:  Patient is a 58 y.o. f who was seen today for physical therapy evaluation and treatment for POTS; EDS. Pt presents today by herself.  She reports high levels of pain due to EDS, chronic fatigue and chronic pain syndrome.  She has had multiple falls due to EDS and dysautonomia.  She has been seen by multiple MD's and therapists over the last few months and feels as though all of her issues are not being properly addressed.  Her goals here today are to be instructed on an aquatic exercise program that she can progress/regress, dependant on how she is feeling on her own.  She has had aquatic therapy before and states it was helpful. She is having a good day today. Her strength testing demonstrate weakness in hip abd and knee extensors and balance not particularly challenged as per Lars but her ABC Scale states she is 93% limited. She also reports her QOL is poor.  She  will benefit from skilled PT intervention in aquatics to improve all areas of deficit as well as QOL  OBJECTIVE IMPAIRMENTS: decreased activity tolerance, decreased balance, decreased mobility, difficulty walking, decreased strength, impaired flexibility, and pain.   ACTIVITY LIMITATIONS: carrying, lifting, squatting, and locomotion level  PARTICIPATION LIMITATIONS: meal prep, cleaning, laundry, community activity, occupation, and yard work  PERSONAL FACTORS: Past/current experiences, Time since onset of injury/illness/exacerbation, and 3+ comorbidities: see Pmhx are also affecting patient's functional outcome.   REHAB POTENTIAL: Good  CLINICAL DECISION MAKING: Evolving/moderate complexity  EVALUATION COMPLEXITY: High   GOALS: Goals reviewed with patient? Yes  SHORT TERM GOALS: Target date: 10/19/23  Pt will tolerate full aquatic sessions consistently without increase in pain and with improving function to demonstrate good toleration and effectiveness of intervention.  Baseline: Goal status: MET 10/24/23  2.  Pt will report reduction of general pain while submerged by 50% Baseline:  Goal status: IN PROGRESS -10/24/23    LONG TERM GOALS: Target date: 12/01/23  Pt will improve on ABC Scale by at least 6% to demonstration statistically significant improvement. Baseline: 150/160=9.3% Goal status: INITIAL  2.  Pt will be indep with final Aquatic HEP for continued management of condition Baseline:  Goal status: INITIAL  3.  Pt will report normal pain to be reduced by 2 NPRS. Baseline: see chart Goal status: INITIAL  4.  Pt will improve on Berg balance test to >/= 50/56  (5pt MDC) to demonstrate a decrease in fall risk. Baseline: 45/56 Goal status: INITIAL  5.  Pt will improve on 5 X STS test to <or=10s (MDC2.3s) to demonstrate improving functional lower extremity strength, transitional movements, and balance Baseline: 12.60 Goal status: INITIAL   PLAN:  PT FREQUENCY:  1-2x/week  PT DURATION: 8 weeks  PLANNED INTERVENTIONS: 97750- Physical Performance Testing, 97110-Therapeutic exercises, 97530- Therapeutic activity, [id number]- Neuromuscular re-education, 97535- Self Care, [id number]- Manual therapy, [id number]- Gait training, [phone]- Aquatic Therapy,  [phone] (1-2 muscles), [id number] (3+ muscles)- Dry Needling, Patient/Family education, Balance training, Stair training, Taping, Joint mobilization, Cryotherapy, and  Moist heat.  PLAN FOR NEXT SESSION: Aquatic: pain management; general strengthening, balance and proprioception re training and aerobic capacity .   Ronal Luyando) Elisia Stepp MPT 11/02/23 5:38 PM [facility] [address] [place], [place], [address] Phone: [phone]   Fax:  [phone]     For all possible CPT codes, reference the Planned Interventions line above.     Check all conditions that are expected to impact treatment: {Conditions expected to impact treatment:Musculoskeletal disorders   If treatment provided at initial evaluation, no treatment charged due to lack of authorization.

## 2023-11-07 ENCOUNTER — Other Ambulatory Visit: Payer: Self-pay

## 2023-11-07 ENCOUNTER — Encounter (HOSPITAL_BASED_OUTPATIENT_CLINIC_OR_DEPARTMENT_OTHER): Payer: Self-pay | Admitting: Family Medicine

## 2023-11-07 ENCOUNTER — Other Ambulatory Visit (HOSPITAL_BASED_OUTPATIENT_CLINIC_OR_DEPARTMENT_OTHER): Payer: Self-pay | Admitting: *Deleted

## 2023-11-07 ENCOUNTER — Other Ambulatory Visit (HOSPITAL_COMMUNITY): Payer: Self-pay

## 2023-11-07 DIAGNOSIS — G894 Chronic pain syndrome: Secondary | ICD-10-CM

## 2023-11-07 MED ORDER — DULOXETINE HCL 40 MG PO CPEP
40.0000 mg | ORAL_CAPSULE | Freq: Every morning | ORAL | 1 refills | Status: DC
  Filled 2023-11-07: qty 30, 30d supply, fill #0
  Filled 2023-12-03: qty 30, 30d supply, fill #1

## 2023-11-08 ENCOUNTER — Ambulatory Visit (INDEPENDENT_AMBULATORY_CARE_PROVIDER_SITE_OTHER): Admitting: Allergy

## 2023-11-08 DIAGNOSIS — J3089 Other allergic rhinitis: Secondary | ICD-10-CM | POA: Diagnosis not present

## 2023-11-08 DIAGNOSIS — H1013 Acute atopic conjunctivitis, bilateral: Secondary | ICD-10-CM | POA: Diagnosis not present

## 2023-11-08 NOTE — Progress Notes (Unsigned)
 Follow-up Note  RE: Lindsey [name] MRN: [id number] DOB: 02/04/1966 Date of Office Visit: 11/08/2023   History of present illness: Lindsey [name] is a 58 y.o. female presenting today for skin testing visit.  She was last seen in the office on 10/02/23 by Dr Marinda for allergic rhinitis with conjunctivitis, asthma, contact dermatitis with history of smoldering myeloma, CVID on replacement therapy.  She is in her usual state of health today without recent illness.  She has held antihistamines for at least 3 days for testing today.   Medication List: Current Outpatient Medications  Medication Sig Dispense Refill   albuterol  (PROVENTIL ) (2.5 MG/3ML) 0.083% nebulizer solution Take 3 mLs (2.5 mg total) by nebulization every 6 (six) hours as needed. 75 mL 2   albuterol  (VENTOLIN  HFA) 108 (90 Base) MCG/ACT inhaler Inhale 2 puffs into the lungs every 6 (six) hours as needed for wheezing or shortness of breath. 18 g 0   amitriptyline  (ELAVIL ) 25 MG tablet Take 1 tablet (25 mg total) by mouth at bedtime. 90 tablet 1   budesonide -formoterol  (SYMBICORT ) 80-4.5 MCG/ACT inhaler Inhale 2 puffs by mouth every 4 hours as needed, maximum 12 puffs per day and at onset of respiratory illness/asthma flare: Inhale 2 puffs twice daily with spacer for 2 weeks or until symptoms resolve. 10.2 g 12   celecoxib  (CELEBREX ) 100 MG capsule Take 1 capsule (100 mg total) by mouth 2 (two) times daily. 60 capsule 2   clonazePAM  (KLONOPIN ) 0.5 MG tablet Take 1 tablet (0.5 mg total) by mouth at bedtime as needed for anxiety. Max daily amount: 0.5 mg 30 tablet 5   cycloSPORINE (RESTASIS) 0.05 % ophthalmic emulsion Place 1 drop into both eyes 2 (two) times daily.     DULoxetine  (CYMBALTA ) 20 MG capsule Take 1 capsule (20 mg total) by mouth daily. 30 capsule 0   DULoxetine  HCl 40 MG CPEP Take 1 capsule (40 mg total) by mouth every morning. 30 capsule 1   EPINEPHrine  (EPIPEN  2-PAK) 0.3 mg/0.3 mL IJ SOAJ injection Inject 0.3 mg  into the muscle as needed for anaphylaxis. 1 each 1   estradiol  (VIVELLE -DOT) 0.0375 MG/24HR Place 1 patch onto the skin twice a week. 24 patch 0   estradiol  (VIVELLE -DOT) 0.1 MG/24HR patch Place 1 patch onto the skin 2 (two) times a week.     fexofenadine  (ALLEGRA ) 180 MG tablet Take 1 tablet (180 mg total) by mouth daily. 100 tablet 1   gabapentin  (NEURONTIN ) 300 MG capsule Take 2 capsules (600 mg total) by mouth at bedtime as needed. 180 capsule 1   Immune Globulin, Human, (HIZENTRA) 10 GM/50ML SOLN Inject 10 g into the skin once a week.     levocetirizine (XYZAL ) 5 MG tablet Take 1 tablet (5 mg total) by mouth daily. 30 tablet 5   linaclotide  (LINZESS ) 72 MCG capsule Take 1 capsule (72 mcg total) by mouth daily. (Patient not taking: Reported on 10/02/2023) 90 capsule 2   methocarbamol  (ROBAXIN ) 500 MG tablet Take 2 tablets (1,000 mg total) by mouth 2 (two) times daily as needed. 180 tablet 1   montelukast  (SINGULAIR ) 10 MG tablet Take 1 tablet (10 mg total) by mouth at bedtime. 90 tablet 1   polyethylene glycol powder (GLYCOLAX /MIRALAX ) 17 GM/SCOOP powder Take 17 g by mouth once daily. Mix per [facility] Preparation Instructions 510 g 0   progesterone  (PROMETRIUM ) 200 MG capsule Take 3 capsules (600 mg total) by mouth at bedtime. 270 capsule 3   sertraline  (ZOLOFT )  50 MG tablet Take one half tablet (25 mg dose) by mouth daily for 6 days, THEN one tablet (50 mg dose) daily. 30 tablet 1   Sodium Fluoride  1.1 % PSTE use as directed 100 mL 6   Testosterone  75 MG PLLT 87.mg every 3 months, insert 1 pellet every 3 months.     traMADol  (ULTRAM ) 50 MG tablet Take 1-2 tablets (50-100 mg total) by mouth every 8 (eight) hours as needed for pain. 180 tablet 0   traZODone  (DESYREL ) 100 MG tablet Take 1-2 tablets (100-200 mg total) by mouth at bedtime. 180 tablet 1   No current facility-administered medications for this visit.     Known medication allergies: Allergies  Allergen Reactions   Poison Oak  Extract [Poison Oak Extract] Anaphylaxis   Xiidra [Lifitegrast] Swelling    Eye swelling and blurred vision.    Levothyroxine Sodium    Ondansetron     Sulfa Antibiotics Nausea And Vomiting   Sulfamethoxazole-Trimethoprim    Tamoxifen Nausea Only and Other (See Comments)   Skin Protectants, Misc. Other (See Comments), Rash and Swelling    Diagnostics/Labs:  Allergy  testing:   Airborne Adult Perc - 11/08/23 1433     Time Antigen Placed 1433    Allergen Manufacturer Jestine    Location Back    Number of Test 55    1. Control-Buffer 50% Glycerol Negative    2. Control-Histamine 2+    3. Bahia Negative    4. [place] Negative    5. Johnson 2+    6. [place]  Blue Negative    7. Meadow Fescue 2+    8. Perennial Rye 2+    9. Timothy 2+    10. Ragweed Mix Negative    11. Cocklebur Negative    12. Plantain,  English Negative    13. Baccharis Negative    14. Dog Fennel Negative    15. Russian Thistle Negative    16. Lamb's Quarters Negative    17. Sheep Sorrell Negative    18. Rough Pigweed 2+    19. Marsh Elder, Rough Negative    20. Mugwort, Common Negative    21. Box, Elder 3+    22. Cedar, red Negative    23. Sweet Gum 2+    24. Pecan Pollen Negative    25. Pine Mix 2+    26. Walnut, Black Pollen Negative    27. Red Mulberry 2+    28. Ash Mix Negative    29. Birch Mix 2+    30. Beech American 2+    31. Cottonwood, Eastern 3+    32. Hickory, White 3+    33. Maple Mix Negative    34. Oak, [place] Mix 3+    35. Sycamore Eastern 2+    36. Alternaria Alternata 2+    37. Cladosporium Herbarum 2+    38. Aspergillus Mix Negative    39. Penicillium Mix 2+    40. Bipolaris Sorokiniana (Helminthosporium) 2+    41. Drechslera Spicifera (Curvularia) 2+    42. Mucor Plumbeus Negative    43. Fusarium Moniliforme 2+    44. Aureobasidium Pullulans (pullulara) 2+    45. Rhizopus Oryzae 2+    46. Botrytis Cinera Negative    47. Epicoccum Nigrum 2+    48. Phoma Betae 2+    49.  Dust Mite Mix Negative    50. Cat Hair 10,000 BAU/ml 2+    51.  Dog Epithelia 2+    52. Mixed Feathers 2+  53. Horse Epithelia 2+    54. Cockroach, German 2+    55. Tobacco Leaf 2+          Allergy  testing results were read and interpreted by provider, documented by clinical staff.   Assessment and plan:   Allergic rhinitis with conjunctivitis Asthma Contact dermatitis Elevated IgA --> diagnosed and being monitored for smoldering myeloma Low IgG, IgM with poor vaccine response --> diagnosed with CVID now on IgG replacement therapy managed by Dr Tobie ([facility]) Ehlers-Danlos syndrome, POTS  Symptoms not currently controlled (nasal congestion, drainage of ears) - Testing today showed: grasses, weeds, trees, indoor molds, outdoor molds, cat, dog, cockroach, horse, and mixed feathers - Provided with information on allergy  shots as a means of long-term control. - Allergy  shots re-train and reset the immune system to ignore environmental allergens and decrease the resulting immune response to those allergens (sneezing, itchy watery eyes, runny nose, nasal congestion, etc).    - Allergy  shots improve symptoms in 75-85% of patients.  - You can schedule for a new start for allergy  shots  -Continue daily Singulair  -Continue Allegra  and Xyzal  daily -Continue to have access to epinephrine  device.   Continue nasal saline rinses 1-2 times daily as needed.  -Have access to Symbicort  80 mcg 2 puffs every 4 hours as needed, max 12 puffs/day. Can also use 2 puffs prior to exercise if that cause symptoms. Or can use 1 vial of albuterol  every 4 hours via nebulizer. -Recommend follow-up with ENT to discuss therapeutic options for ETD and ear drainage. .  -She will continue to follow with heme/onc for smoldering myeloma management -She will continue to follow with immunology for IgG replacement therapy  Follow-up for routine visit in about 6 months or sooner if needed.   I appreciate the  opportunity to take part in Lindsey [name]'s care. Please do not hesitate to contact me with questions.  Sincerely,   Danita Brain, MD Allergy /Immunology [facility]

## 2023-11-08 NOTE — Patient Instructions (Addendum)
 Allergic rhinitis with conjunctivitis Asthma Contact dermatitis Elevated IgA --> diagnosed and being monitored for smoldering myeloma Low IgG, IgM with poor vaccine response --> diagnosed with CVID now on IgG replacement therapy managed by Dr Tobie ([facility]) Ehlers-Danlos syndrome, POTS  Symptoms not currently controlled (nasal congestion, drainage of ears) - Testing today showed: grasses, weeds, trees, indoor molds, outdoor molds, cat, dog, cockroach, horse, and mixed feathers - Provided with information on allergy  shots as a means of long-term control. - Allergy  shots re-train and reset the immune system to ignore environmental allergens and decrease the resulting immune response to those allergens (sneezing, itchy watery eyes, runny nose, nasal congestion, etc).    - Allergy  shots improve symptoms in 75-85% of patients.  - You can schedule for a new start for allergy  shots  -Continue daily Singulair  -Continue Allegra  and Xyzal  daily -Continue to have access to epinephrine  device.   Continue nasal saline rinses 1-2 times daily as needed.  -Have access to Symbicort  80 mcg 2 puffs every 4 hours as needed, max 12 puffs/day. Can also use 2 puffs prior to exercise if that cause symptoms. Or can use 1 vial of albuterol  every 4 hours via nebulizer. -Recommend follow-up with ENT to discuss therapeutic options for ETD and ear drainage. .  -She will continue to follow with heme/onc for smoldering myeloma management -She will continue to follow with immunology for IgG replacement therapy  Follow-up for routine visit in about 6 months or sooner if needed.

## 2023-11-09 ENCOUNTER — Encounter (HOSPITAL_BASED_OUTPATIENT_CLINIC_OR_DEPARTMENT_OTHER): Payer: Self-pay | Admitting: Physical Therapy

## 2023-11-09 ENCOUNTER — Ambulatory Visit (HOSPITAL_BASED_OUTPATIENT_CLINIC_OR_DEPARTMENT_OTHER): Admitting: Physical Therapy

## 2023-11-09 ENCOUNTER — Encounter: Payer: Self-pay | Admitting: Allergy

## 2023-11-09 ENCOUNTER — Other Ambulatory Visit (HOSPITAL_BASED_OUTPATIENT_CLINIC_OR_DEPARTMENT_OTHER): Payer: Self-pay

## 2023-11-09 ENCOUNTER — Other Ambulatory Visit: Payer: Self-pay

## 2023-11-09 DIAGNOSIS — R278 Other lack of coordination: Secondary | ICD-10-CM

## 2023-11-09 DIAGNOSIS — G8929 Other chronic pain: Secondary | ICD-10-CM

## 2023-11-09 DIAGNOSIS — M6281 Muscle weakness (generalized): Secondary | ICD-10-CM | POA: Diagnosis not present

## 2023-11-09 NOTE — Therapy (Signed)
 OUTPATIENT PHYSICAL THERAPY THORACOLUMBAR TREATMENT   Patient Name: Lindsey [name] MRN: [id number] DOB:May 17, 1965, 58 y.o., female Today's Date: 11/09/2023  END OF SESSION:  PT End of Session - 11/09/23 1556     Visit Number 6    Number of Visits 12    Date for PT Re-Evaluation 12/01/23    Authorization Type mcaid    PT Start Time 1530    PT Stop Time 1612    PT Time Calculation (min) 42 min    Activity Tolerance Patient limited by fatigue    Behavior During Therapy [facility] for tasks assessed/performed             Past Medical History:  Diagnosis Date   Allergy     Angio-edema    Anxiety 09/18/2022   Arthritis    Asthma    Autoimmune disease (HCC) 09/08/2023   Cancer (HCC) 10/25/2021   Carpal tunnel syndrome 07/07/2015   right   Chronic cough 12/02/2015   Chronic pain syndrome 08/10/2016   CVID (common variable immunodeficiency) (HCC)    Depression 09/18/2022   Ehlers-Danlos syndrome    Essential alopecia of women 04/14/2011   Family history of breast cancer in female 08/26/2015   (x2) paternal 1st cousins dx in their 75s    Fibrocystic breast changes of both breasts 07/19/2015   Fibromyalgia    HLD (hyperlipidemia)    Hypermobility syndrome 11/26/2014   Based on Fam Hx and Beighton Score of 7 this is likely ED III    Hypertrophy of vulva 07/20/2017   Inflammatory dermatosis 04/14/2011   Irritable larynx syndrome 12/02/2015   Lumbar radiculopathy 02/16/2016   MGUS (monoclonal gammopathy of unknown significance)    Osteoarthritis 04/01/2017   Osteopenia    Osteoporosis 12/05/2017   Primary arthrosis of first carpometacarpal joints, bilateral 06/01/2015   Sacroiliac joint dysfunction of right side 11/26/2014   B SI joint xrays last done at [facility] Dr. Curt 03/13/2017   Skin cancer    Past Surgical History:  Procedure Laterality Date   BREAST EXCISIONAL BIOPSY Left 01/2019   atypical lobular hyperplasia   BREAST SURGERY  02/05/2019   Atypical  Lobular Hyperplasia   CARPAL TUNNEL RELEASE Right 06/2020   COLONOSCOPY     DENTAL SURGERY     ELBOW FRACTURE SURGERY Left    as child   EYE SURGERY  2005   LASIK   FRACTURE SURGERY  1974   L elbow break   JOINT REPLACEMENT  07/07/2020   first metacarpal joint R hand   RADIOACTIVE SEED GUIDED EXCISIONAL BREAST BIOPSY Left 02/05/2019   Procedure: RADIOACTIVE SEED GUIDED EXCISIONAL LEFT BREAST BIOPSY;  Surgeon: Ebbie Cough, MD;  Location: [facility];  Service: General;  Laterality: Left;   SPINE SURGERY  2019   RFA, [facility]wood Boor; [facility]   Patient Active Problem List   Diagnosis Date Noted   Acne vulgaris 09/08/2023   Allergic shiners 09/08/2023   Autoimmune disease (HCC) 09/08/2023   Disorder of connective tissue (HCC) 09/08/2023   Lentigo 09/08/2023   Squamous cell carcinoma in situ of skin of abdomen 09/08/2023   Monoclonal MCAS (HCC) 08/25/2023   Strain of muscle(s) and tendon(s) of the rotator cuff of right shoulder, subsequent encounter 08/25/2023   Pain in joint of right shoulder 07/07/2023   Tear of lateral meniscus of knee 07/07/2023   Other allergic rhinitis 06/05/2023   Moderate persistent asthma, uncomplicated 06/05/2023   Assistance needed with transportation 02/13/2023   Impaired mobility  and ADLs 02/13/2023   Dyssynergic defecation 12/13/2022   Postural dizziness with presyncope 11/05/2022   Auditory processing disorder 09/22/2022   Anxiety 09/18/2022   Depression 09/18/2022   Panic attack due to post traumatic stress disorder (PTSD) 09/18/2022   History of penicillin allergy  08/29/2022   Muscle weakness (generalized) 06/30/2022   Arthralgia of right knee 06/14/2022   At high risk for breast cancer 04/19/2022   Allergic rhinitis due to pollen 04/18/2022   Chronic fatigue syndrome 02/03/2022   Common variable immunodeficiency, unspecified (HCC) 12/02/2021   Postural orthostatic tachycardia syndrome (POTS) 11/25/2021    Vasovagal syncope 11/25/2021   MGUS (monoclonal gammopathy of unknown significance) 10/25/2021   Smoldering multiple myeloma (SMM) 10/25/2021   Acquired primary hypogammaglobulinemia (HCC) 09/10/2021   Carpal tunnel syndrome of right wrist 03/11/2021   Encounter for orthopedic follow-up care 07/07/2020   Pain in right hand 06/03/2020   Gluten intolerance 04/11/2020   Non-restorative sleep 01/14/2020   Menopausal and female climacteric states 01/14/2020   Insomnia due to medical condition 01/14/2020   Asthma 06/18/2019   Migraine 06/18/2019   Osteopenia of multiple sites 06/18/2019   Chronic right-sided low back pain 02/22/2018   Hypertrophy of vulva 07/20/2017   Osteoarthritis 04/01/2017   Fibromyalgia 08/10/2016   Chronic pain syndrome 08/10/2016   Lumbar radiculopathy 02/16/2016   Hearing impairment 12/04/2015   Chronic cough 12/02/2015   Irritable larynx syndrome 12/02/2015   Genetic testing 09/21/2015   Family history of breast cancer in female 08/26/2015   Fibrocystic breast changes of both breasts 07/19/2015   Carpal tunnel syndrome 07/07/2015   Primary arthrosis of first carpometacarpal joints, bilateral 06/01/2015   Ehlers-Danlos syndrome 05/11/2015   Sacroiliac joint dysfunction of right side 11/26/2014   Hypermobility syndrome 11/26/2014   Essential alopecia of women 04/14/2011   Inflammatory dermatosis 04/14/2011   Post-traumatic stress disorder, chronic 05/01/2007   Adjustment disorder with mixed anxiety and depressed mood 12/01/2006   Irritable bowel syndrome with both constipation and diarrhea 04/11/1998    PCP: Betty Swaziland MD  REFERRING PROVIDER: [name], Raymond J, MD   REFERRING DIAG: G90.A (ICD-10-CM) - Postural orthostatic tachycardia syndrome (POTS)   Rationale for Evaluation and Treatment: Rehabilitation  THERAPY DIAG:  Muscle weakness (generalized)  Other chronic pain  Other lack of coordination  ONSET DATE: Chronic  SUBJECTIVE:                                                                                                                                                                                            SUBJECTIVE STATEMENT: Pt had allergy  testing yesterday.  Continues to have shortness of  breath and fatigue.  POOL ACCESS: currently none, but plans to join [facility].  From initial evaluation:  I have had many different doctors am being treated by multiple PT's.  I have multiple different issues.  Having trouble getting the treatment I need. Pain general everywhere.  Already doing the right start program here at [facility]. End goal is to get a membership here for access to warm pool.  PERTINENT HISTORY:  Evaluate and Treat. 1-2 times per week for 4-6 weeks. Decrease pain, increase strength, flexibility, function, and range of motion. Modalities may include, traction, ionto, phono, and stim. May include dry needling with or without stim.  EDS; chronic pain syndrome; fibromyalgia; PTSD;dysautonomia   PAIN:  Are you having pain? Yes: NPRS scale: 9/10 Pain location: general, mostly back Pain description: dull ache, sharp Aggravating factors: movement Relieving factors: movement  PRECAUTIONS: None  RED FLAGS: None   WEIGHT BEARING RESTRICTIONS: No  FALLS:  Has patient fallen in last 6 months? Yes. Number of falls 3 POTS  LIVING ENVIRONMENT: Lives with: lives alone Lives in: House/apartment   PLOF: Independent  PATIENT GOALS: progressive exercises to work on.    NEXT MD VISIT:   OBJECTIVE:  Note: Objective measures were completed at Evaluation unless otherwise noted.    PATIENT SURVEYS:  The Activities-Specific Balance Confidence (ABC) Scale 0% 10 20 30  40 50 60 70 80 90 100% No confidence<->completely confident  "How confident are you that you will not lose your balance or become unsteady when you . . .   Date tested 10/02/23  Walk around the house 10%  2. Walk up or down stairs 10%  3. Bend  over and pick up a slipper from in front of a closet floor 10%  4. Reach for a small can off a shelf at eye level 20%  5. Stand on tip toes and reach for something above your head 20%  6. Stand on a chair and reach for something 0%  7. Sweep the floor 10%  8. Walk outside the house to a car parked in the driveway 10%  9. Get into or out of a car 10%  10. Walk across a parking lot to the mall 30%  11. Walk up or down a ramp 10%  12. Walk in a crowded mall where people rapidly walk past you 0%  13. Are bumped into by people as you walk through the mall 0%  14. Step onto or off of an escalator while you are holding onto the railing 0%  15. Step onto or off an escalator while holding onto parcels such that you cannot hold onto the railing 0%  16. Walk outside on icy sidewalks 10%  Total: #/16 150/16=9.3%     COGNITION: Overall cognitive status: Within functional limits for tasks assessed     SENSATION: WFL   POSTURE: No Significant postural limitations   LUMBAR ROM:  Full/hypermobile  LOWER EXTREMITY ROM:    Full/hypermobile  LOWER EXTREMITY STRENGTH:    HD (Lbs) Right eval Left eval  Hip flexion 35.0 35.4  Hip extension    Hip abduction 22.0 20.2  Hip adduction    Hip internal rotation    Hip external rotation    Knee flexion    Knee extension 32.1 32.0  Ankle dorsiflexion    Ankle plantarflexion    Ankle inversion    Ankle eversion     (Blank rows = not tested) Item Test date: 10/02/23 Test date:  Test date:  Sitting to standing 3. able to stand independently using hands Insert OPRCBERGREEVAL SmartPhrase at re-test date Insert OPRCBERGREEVAL SmartPhrase at re-test date  2. Standing unsupported 4. able to stand safely for 2 minutes    3. Sitting with back unsupported, feet supported 4. able to sit safely and securely for 2 minutes    4. Standing to sitting 3. controls descent by using hands    5. Pivot transfer  3. able to transfer safely with definite need of  hands    6. Standing unsupported with eyes closed 3. able to stand 10 seconds with supervision    7. Standing unsupported with feet together 4. able to place feet together independently and stand 1 minute safely    8. Reaching forward with outstretched arms while standing 3. can reach forward 12 cm (5 inches)    9. Pick up object from the floor from standing 4. able to pick up slipper safely and easily    10. Turning to look behind over left and right shoulders while standing 4. looks behind from both sides and weight shifts well    11. Turn 360 degrees 2. able to turn 360 degrees safely but slowly    12. Place alternate foot on step or stool while standing unsupported 2. able to complete 4 steps without aid with supervision    13. Standing unsupported one foot in front 3. able to place foot ahead independently and hold 30 seconds    14. Standing on one leg 3. able to lift leg independently and hold 5-10 seconds     Total Score 45/56 Total Score /56 Total Score /56     FUNCTIONAL TESTS:  5 times sit to stand: 12.60 pool bench Timed up and go (TUG): 11.42  GAIT: Distance walked: 400 Assistive device utilized: None Level of assistance: Complete Independence Comments: Guarded trunk positioning with min arm swing, slightly shortened step length   TREATMENT  [facility] Adult PT Treatment:                                             Date: 11/09/23 Pt seen for aquatic therapy today.  Treatment took place in water 3.5-4.75 ft in depth at the [facility] pool. Temp of water was 91.  Pt entered/exited the pool via stairs independently with bilat rail.   - unsupported walking forward/ backward - cues to slow pace, 3 laps  - squatted rest break - side stepping with arm add/abdct, 1 lap  -Straddling noodle UE support corner wall: hip add/abdct; flex/ext; gentle cycling; clams; hip flex/extension -Straddling noodle UE support on wall at 4 ft:   relaxed squats; heel raises; hip add/abd; relaxed  squat  Pt requires the buoyancy and hydrostatic pressure of water for support, and to offload joints by unweighting joint load by at least 50 % in navel deep water and by at least 75-80% in chest to neck deep water.  Viscosity of the water is needed for resistance of strengthening. Water current perturbations provides challenge to standing balance requiring increased core activation.                                            PATIENT EDUCATION:  Education details: aquatic therapy exercise progressions/ modifications Person educated: Patient Education  method: Explanation Education comprehension: verbalized understanding  HOME EXERCISE PROGRAM: TBA  ASSESSMENT:  CLINICAL IMPRESSION: Pt reported SOB with gait in water; improved with relaxed rest at wall. RPE up to 6/10 with side stepping.   She reports greatest relief of back pain while vertically suspended by noodle in deeper water. Pain reduced to 5/10 and RPE reduced to 3/10. Limited dizziness reported this visit; mostly just fatigue and SOB when not supported by noodle.  Will continue to progress as tolerated.  Pt has met STG 2 and is making gradual progress towards remaining goals.  Therapist to assess goals next session.    From initial evaluation:  Patient is a 58 y.o. f who was seen today for physical therapy evaluation and treatment for POTS; EDS. Pt presents today by herself.  She reports high levels of pain due to EDS, chronic fatigue and chronic pain syndrome.  She has had multiple falls due to EDS and dysautonomia.  She has been seen by multiple MD's and therapists over the last few months and feels as though all of her issues are not being properly addressed.  Her goals here today are to be instructed on an aquatic exercise program that she can progress/regress, dependant on how she is feeling on her own.  She has had aquatic therapy before and states it was helpful. She is having a good day today. Her strength testing demonstrate  weakness in hip abd and knee extensors and balance not particularly challenged as per Lars but her ABC Scale states she is 93% limited. She also reports her QOL is poor.  She will benefit from skilled PT intervention in aquatics to improve all areas of deficit as well as QOL  OBJECTIVE IMPAIRMENTS: decreased activity tolerance, decreased balance, decreased mobility, difficulty walking, decreased strength, impaired flexibility, and pain.   ACTIVITY LIMITATIONS: carrying, lifting, squatting, and locomotion level  PARTICIPATION LIMITATIONS: meal prep, cleaning, laundry, community activity, occupation, and yard work  PERSONAL FACTORS: Past/current experiences, Time since onset of injury/illness/exacerbation, and 3+ comorbidities: see Pmhx are also affecting patient's functional outcome.   REHAB POTENTIAL: Good  CLINICAL DECISION MAKING: Evolving/moderate complexity  EVALUATION COMPLEXITY: High   GOALS: Goals reviewed with patient? Yes  SHORT TERM GOALS: Target date: 10/19/23  Pt will tolerate full aquatic sessions consistently without increase in pain and with improving function to demonstrate good toleration and effectiveness of intervention.  Baseline: Goal status: MET 10/24/23  2.  Pt will report reduction of general pain while submerged by 50% Baseline:  Goal status: MET- 11/09/23    LONG TERM GOALS: Target date: 12/01/23  Pt will improve on ABC Scale by at least 6% to demonstration statistically significant improvement. Baseline: 150/160=9.3% Goal status: INITIAL  2.  Pt will be indep with final Aquatic HEP for continued management of condition Baseline:  Goal status: INITIAL  3.  Pt will report normal pain to be reduced by 2 NPRS. Baseline: see chart Goal status: INITIAL  4.  Pt will improve on Berg balance test to >/= 50/56  (5pt MDC) to demonstrate a decrease in fall risk. Baseline: 45/56 Goal status: INITIAL  5.  Pt will improve on 5 X STS test to <or=10s  (MDC2.3s) to demonstrate improving functional lower extremity strength, transitional movements, and balance Baseline: 12.60 Goal status: INITIAL   PLAN:  PT FREQUENCY: 1-2x/week  PT DURATION: 8 weeks  PLANNED INTERVENTIONS: 97750- Physical Performance Testing, 97110-Therapeutic exercises, 97530- Therapeutic activity, [id number]- Neuromuscular re-education, 97535- Self Care, 02859- Manual therapy, 02883- Gait  training, 02886- Aquatic Therapy,  [phone] (1-2 muscles), 20561 (3+ muscles)- Dry Needling, Patient/Family education, Balance training, Stair training, Taping, Joint mobilization, Cryotherapy, and Moist heat.  PLAN FOR NEXT SESSION: Aquatic: pain management; general strengthening, balance and proprioception re training and aerobic capacity .  Delon Aquas, PTA 11/09/23 4:32 PM [facility] [address] [place], [place], [address] Phone: [phone]   Fax:  [phone]   For all possible CPT codes, reference the Planned Interventions line above.     Check all conditions that are expected to impact treatment: {Conditions expected to impact treatment:Musculoskeletal disorders   If treatment provided at initial evaluation, no treatment charged due to lack of authorization.

## 2023-11-10 ENCOUNTER — Encounter: Payer: Self-pay | Admitting: Allergy

## 2023-11-11 ENCOUNTER — Emergency Department (HOSPITAL_BASED_OUTPATIENT_CLINIC_OR_DEPARTMENT_OTHER)
Admission: EM | Admit: 2023-11-11 | Discharge: 2023-11-11 | Disposition: A | Discharge status: Home / Self Care | Attending: Emergency Medicine | Admitting: Emergency Medicine

## 2023-11-11 ENCOUNTER — Other Ambulatory Visit: Payer: Self-pay

## 2023-11-11 ENCOUNTER — Emergency Department (HOSPITAL_BASED_OUTPATIENT_CLINIC_OR_DEPARTMENT_OTHER)

## 2023-11-11 ENCOUNTER — Encounter (HOSPITAL_BASED_OUTPATIENT_CLINIC_OR_DEPARTMENT_OTHER): Payer: Self-pay | Admitting: *Deleted

## 2023-11-11 DIAGNOSIS — R0602 Shortness of breath: Secondary | ICD-10-CM | POA: Insufficient documentation

## 2023-11-11 DIAGNOSIS — J45909 Unspecified asthma, uncomplicated: Secondary | ICD-10-CM | POA: Diagnosis not present

## 2023-11-11 DIAGNOSIS — R0789 Other chest pain: Secondary | ICD-10-CM | POA: Insufficient documentation

## 2023-11-11 LAB — CBC
HCT: 43.4 % (ref 36.0–46.0)
Hemoglobin: 14.7 g/dL (ref 12.0–15.0)
MCH: 30.8 pg (ref 26.0–34.0)
MCHC: 33.9 g/dL (ref 30.0–36.0)
MCV: 91 fL (ref 80.0–100.0)
Platelets: 335 K/uL (ref 150–400)
RBC: 4.77 MIL/uL (ref 3.87–5.11)
RDW: 12.5 % (ref 11.5–15.5)
WBC: 9.7 K/uL (ref 4.0–10.5)
nRBC: 0 % (ref 0.0–0.2)

## 2023-11-11 LAB — BASIC METABOLIC PANEL WITH GFR
Anion gap: 15 (ref 5–15)
BUN: 20 mg/dL (ref 6–20)
CO2: 22 mmol/L (ref 22–32)
Calcium: 9.6 mg/dL (ref 8.9–10.3)
Chloride: 101 mmol/L (ref 98–111)
Creatinine, Ser: 1.08 mg/dL — ABNORMAL HIGH (ref 0.44–1.00)
GFR, Estimated: 60 mL/min — ABNORMAL LOW (ref >60)
Glucose, Bld: 92 mg/dL (ref 70–99)
Potassium: 3.8 mmol/L (ref 3.5–5.1)
Sodium: 138 mmol/L (ref 135–145)

## 2023-11-11 LAB — PRO BRAIN NATRIURETIC PEPTIDE: Pro Brain Natriuretic Peptide: <36 pg/mL (ref <300.0)

## 2023-11-11 LAB — TROPONIN T, HIGH SENSITIVITY
Troponin T High Sensitivity: <15 ng/L (ref <19)
Troponin T High Sensitivity: <15 ng/L (ref <19)

## 2023-11-11 LAB — D-DIMER, QUANTITATIVE: D-Dimer, Quant: 0.53 ug{FEU}/mL — ABNORMAL HIGH (ref 0.00–0.50)

## 2023-11-11 MED ORDER — FAMOTIDINE 20 MG PO TABS
20.0000 mg | ORAL_TABLET | Freq: Two times a day (BID) | ORAL | 0 refills | Status: DC
  Filled 2023-11-11: qty 30, 15d supply, fill #0

## 2023-11-11 MED ORDER — IOHEXOL 350 MG/ML SOLN
65.0000 mL | Freq: Once | INTRAVENOUS | Status: AC | PRN
  Administered 2023-11-11: 65 mL via INTRAVENOUS

## 2023-11-11 NOTE — ED Provider Notes (Signed)
 [place]ERGENCY DEPARTMENT AT [facility] Provider Note   CSN: [id number] Arrival date & time: 11/11/23  8040     Patient presents with: Chest Pain and Shortness of Breath   Lindsey [name] is a 58 y.o. female dents with complaints of chest pain and shortness of breath for 2 weeks.  Patient states the chest pain is on her left side.  Does not radiate.  It is described as dull.  Although when she begins ambulating it becomes sharp pain.  Denies any history of cardiac issues or prior blood clots.  She has no cough or any other URI symptoms.  No respiratory issues.   =   Chest Pain Associated symptoms: shortness of breath   Shortness of Breath Associated symptoms: chest pain    Past Medical History:  Diagnosis Date   Allergy     Angio-edema    Anxiety 09/18/2022   Arthritis    Asthma    Autoimmune disease (HCC) 09/08/2023   Cancer (HCC) 10/25/2021   Carpal tunnel syndrome 07/07/2015   right   Chronic cough 12/02/2015   Chronic pain syndrome 08/10/2016   CVID (common variable immunodeficiency) (HCC)    Depression 09/18/2022   Ehlers-Danlos syndrome    Essential alopecia of women 04/14/2011   Family history of breast cancer in female 08/26/2015   (x2) paternal 1st cousins dx in their 52s    Fibrocystic breast changes of both breasts 07/19/2015   Fibromyalgia    HLD (hyperlipidemia)    Hypermobility syndrome 11/26/2014   Based on Fam Hx and Beighton Score of 7 this is likely ED III    Hypertrophy of vulva 07/20/2017   Inflammatory dermatosis 04/14/2011   Irritable larynx syndrome 12/02/2015   Lumbar radiculopathy 02/16/2016   MGUS (monoclonal gammopathy of unknown significance)    Osteoarthritis 04/01/2017   Osteopenia    Osteoporosis 12/05/2017   Primary arthrosis of first carpometacarpal joints, bilateral 06/01/2015   Sacroiliac joint dysfunction of right side 11/26/2014   B SI joint xrays last done at [facility] Dr. Curt 03/13/2017   Skin cancer         Prior to Admission medications   Medication Sig Start Date End Date Taking? Authorizing Provider  albuterol  (PROVENTIL ) (2.5 MG/3ML) 0.083% nebulizer solution Take 3 mLs (2.5 mg total) by nebulization every 6 (six) hours as needed. 06/05/23   Marinda Rocky SAILOR, MD  albuterol  (VENTOLIN  HFA) [phone] Base) MCG/ACT inhaler Inhale 2 puffs into the lungs every 6 (six) hours as needed for wheezing or shortness of breath. 02/01/23   [place], Betty G, MD  amitriptyline  (ELAVIL ) 25 MG tablet Take 1 tablet (25 mg total) by mouth at bedtime. 07/04/23     budesonide -formoterol  (SYMBICORT ) 80-4.5 MCG/ACT inhaler Inhale 2 puffs by mouth every 4 hours as needed, maximum 12 puffs per day and at onset of respiratory illness/asthma flare: Inhale 2 puffs twice daily with spacer for 2 weeks or until symptoms resolve. 10/02/23   Marinda Rocky SAILOR, MD  celecoxib  (CELEBREX ) 100 MG capsule Take 1 capsule (100 mg total) by mouth 2 (two) times daily. 10/02/23     clonazePAM  (KLONOPIN ) 0.5 MG tablet Take 1 tablet (0.5 mg total) by mouth at bedtime as needed for anxiety. Max daily amount: 0.5 mg 09/26/23   [name], [name], MD  cycloSPORINE (RESTASIS) 0.05 % ophthalmic emulsion Place 1 drop into both eyes 2 (two) times daily.    [provider]  DULoxetine  (CYMBALTA ) 20 MG capsule Take 1 capsule (  20 mg total) by mouth daily. 10/24/23   Smith, Zachary M, DO  DULoxetine  HCl 40 MG CPEP Take 1 capsule (40 mg total) by mouth every morning. 11/07/23     EPINEPHrine  (EPIPEN  2-PAK) 0.3 mg/0.3 mL IJ SOAJ injection Inject 0.3 mg into the muscle as needed for anaphylaxis. 12/29/22   Jeneal Danita Macintosh, MD  estradiol  (VIVELLE -DOT) 0.0375 MG/24HR Place 1 patch onto the skin twice a week. 08/07/23     estradiol  (VIVELLE -DOT) 0.1 MG/24HR patch Place 1 patch onto the skin 2 (two) times a week.    [provider]  fexofenadine  (ALLEGRA ) 180 MG tablet Take 1 tablet (180 mg total) by mouth daily. 10/02/23   Marinda Rocky SAILOR, MD   gabapentin  (NEURONTIN ) 300 MG capsule Take 2 capsules (600 mg total) by mouth at bedtime as needed. 07/04/23     Immune Globulin, Human, (HIZENTRA) 10 GM/50ML SOLN Inject 10 g into the skin once a week.    [provider]  levocetirizine (XYZAL ) 5 MG tablet Take 1 tablet (5 mg total) by mouth daily. 10/02/23   Marinda Rocky SAILOR, MD  montelukast  (SINGULAIR ) 10 MG tablet Take 1 tablet (10 mg total) by mouth at bedtime. 10/02/23   Marinda Rocky SAILOR, MD  polyethylene glycol powder (GLYCOLAX /MIRALAX ) 17 GM/SCOOP powder Take 17 g by mouth once daily. Mix per [facility] Preparation Instructions 10/02/23     progesterone  (PROMETRIUM ) 200 MG capsule Take 3 capsules (600 mg total) by mouth at bedtime. 05/30/23     sertraline  (ZOLOFT ) 50 MG tablet Take one half tablet (25 mg dose) by mouth daily for 6 days, THEN one tablet (50 mg dose) daily. 10/24/23     Sodium Fluoride  1.1 % PSTE use as directed 08/02/23     Testosterone  75 MG PLLT 87.mg every 3 months, insert 1 pellet every 3 months. 04/12/15   [provider]  traMADol  (ULTRAM ) 50 MG tablet Take 1-2 tablets (50-100 mg total) by mouth every 8 (eight) hours as needed for pain. 08/31/23     traZODone  (DESYREL ) 100 MG tablet Take 1-2 tablets (100-200 mg total) by mouth at bedtime. 07/04/23       Allergies: Poison oak extract [poison oak extract]; Xiidra [lifitegrast]; Levothyroxine sodium; Ondansetron ; Sulfa antibiotics; Sulfamethoxazole-trimethoprim; Tamoxifen; and Skin protectants, misc.    Review of Systems  Respiratory:  Positive for shortness of breath.   Cardiovascular:  Positive for chest pain.    Updated Vital Signs BP (!) 155/123   Pulse (!) 59   Temp 97.8 F (36.6 C) (Oral)   Resp 15   LMP 04/08/2014   SpO2 98%   Physical Exam Vitals and nursing note reviewed.  Constitutional:      General: She is not in acute distress.    Appearance: She is well-developed.  HENT:     Head: Normocephalic and atraumatic.  Eyes:      Conjunctiva/sclera: Conjunctivae normal.  Cardiovascular:     Rate and Rhythm: Normal rate and regular rhythm.     Heart sounds: No murmur heard. Pulmonary:     Effort: Pulmonary effort is normal. No respiratory distress.     Breath sounds: Normal breath sounds.  Abdominal:     Palpations: Abdomen is soft.     Tenderness: There is no abdominal tenderness.  Musculoskeletal:        General: No swelling.     Cervical back: Neck supple.  Skin:    General: Skin is warm and dry.     Capillary Refill:  Capillary refill takes less than 2 seconds.  Neurological:     Mental Status: She is alert.  Psychiatric:        Mood and Affect: Mood normal.     (all labs ordered are listed, but only abnormal results are displayed) Labs Reviewed  BASIC METABOLIC PANEL WITH GFR - Abnormal; Notable for the following components:      Result Value   Creatinine, Ser 1.08 (*)    GFR, Estimated 60 (*)    All other components within normal limits  D-DIMER, QUANTITATIVE - Abnormal; Notable for the following components:   D-Dimer, Quant 0.53 (*)    All other components within normal limits  CBC  PRO BRAIN NATRIURETIC PEPTIDE  TROPONIN T, HIGH SENSITIVITY  TROPONIN T, HIGH SENSITIVITY    EKG: EKG Interpretation Date/Time:  Saturday November 11 2023 20:06:00 EDT Ventricular Rate:  73 PR Interval:  160 QRS Duration:  98 QT Interval:  375 QTC Calculation: 414 R Axis:   20  Text Interpretation: Sinus rhythm normal, nosig change from previous Confirmed by Armenta Canning [phone]) on 11/11/2023 8:09:30 PM  Radiology: CT Angio Chest PE W and/or Wo Contrast Result Date: 11/11/2023 EXAM: CTA of the Chest with contrast for PE 11/11/2023 09:25:21 PM TECHNIQUE: CTA of the chest was performed after the administration of intravenous contrast. Multiplanar reformatted images are provided for review. MIP images are provided for review. Automated exposure control, iterative reconstruction, and/or weight based adjustment  of the mA/kV was utilized to reduce the radiation dose to as low as reasonably achievable. COMPARISON: Same day chest radiograph and CT 11/11/2023. CLINICAL HISTORY: Pulmonary embolism (PE) suspected, high prob. Centralized chest pain worse when lying flat and shortness of breath. Pt reports the shortness of breath has been worsening over the past 2 weeks with a pulse oximetry at PT reading 80% on RA. Pt to ED today because chest pain has worsened today. FINDINGS: PULMONARY ARTERIES: Pulmonary arteries are adequately opacified for evaluation. No pulmonary embolism. Main pulmonary artery is normal in caliber. MEDIASTINUM: The heart and pericardium demonstrate no acute abnormality. There is no acute abnormality of the thoracic aorta. LYMPH NODES: No mediastinal, hilar or axillary lymphadenopathy. LUNGS AND PLEURA: The lungs are without acute process. No focal consolidation or pulmonary edema. No pleural effusion or pneumothorax. UPPER ABDOMEN: Small hiatal hernia. SOFT TISSUES AND BONES: No acute bone or soft tissue abnormality. IMPRESSION: 1. No pulmonary embolism. Electronically signed by: Norman Gatlin MD 11/11/2023 09:31 PM EDT RP Workstation: [id number]   DG Chest Port 1 View Result Date: 11/11/2023 CLINICAL DATA:  Chest pain EXAM: PORTABLE CHEST 1 VIEW COMPARISON:  09/02/2015, CT 04/30/2023 FINDINGS: The heart size and mediastinal contours are within normal limits. Aortic atherosclerosis. Both lungs are clear. The visualized skeletal structures are unremarkable. IMPRESSION: No active disease. Electronically Signed   By: Luke Bun M.D.   On: 11/11/2023 20:24     Procedures   Medications Ordered in the ED  iohexol  (OMNIPAQUE ) 350 MG/ML injection 65 mL (65 mLs Intravenous Contrast Given 11/11/23 2125)                                    Medical Decision Making Amount and/or Complexity of Data Reviewed Labs: ordered. Radiology: ordered.  Risk Prescription drug management.   This patient  presents to the ED with chief complaint(s) of chest pain .  The complaint involves an extensive differential diagnosis and also  carries with it a high risk of complications and morbidity.   Pertinent past medical history as listed in HPI  The differential diagnosis includes  ACS, PE, pneumonia, URI, asthma/COPD Additional history obtained: Records reviewed Care Everywhere/External Records  Assessment and management:   Hemodynamically stable, nontoxic-appearing patient presented with complaints of chest pain and shortness of breath x 2 weeks.  She states her will occur when lying down as well as when she is up and walking.  She has no cardiac or respiratory history.  Denies any prior blood clots.  Overall workup is very reassuring.  EKG sinus rhythm.  Troponins without elevation.  Dimer was mildly elevated, however PE study was negative.  Her lungs are clear.  She is afebrile.  Do not suspect pneumonia.  Patient states her symptoms are worse with reclining.  Will send in prescription for famotidine  to trial for any improvement.  She will follow-up with her PCP and has a cardiologist as well.  Independent ECG interpretation:  Sinus rhythm, no ischemic changes  Independent labs interpretation:  The following labs were independently interpreted:  CBC unremarkable, troponin without elevation, BMP with mildly elevated creatinine, troponin without elevation  Independent visualization and interpretation of imaging: I independently visualized the following imaging with scope of interpretation limited to determining acute life threatening conditions related to emergency care: Chest x-ray without any active disease   Consultations obtained:   none  Disposition:   Patient will be discharged home. The patient has been appropriately medically screened and/or stabilized in the ED. I have low suspicion for any other emergent medical condition which would require further screening, evaluation or treatment  in the ED or require inpatient management. At time of discharge the patient is hemodynamically stable and in no acute distress. I have discussed work-up results and diagnosis with patient and answered all questions. Patient is agreeable with discharge plan. We discussed strict return precautions for returning to the emergency department and they verbalized understanding.     Social Determinants of Health:   None  This note was dictated with voice recognition software.  Despite best efforts at proofreading, errors may have occurred which can change the documentation meaning.       Final diagnoses:  Atypical chest pain    ED Discharge Orders     None          Donnajean Lynwood VEAR DEVONNA 11/11/23 2312    Armenta Canning, MD 11/12/23 1620

## 2023-11-11 NOTE — ED Notes (Signed)
 Patient arrived with complaints of mid sternal CP and SOB. BBS CTA. NAD HR 71 RR 17-20 100% SpO2 on room air.

## 2023-11-11 NOTE — ED Triage Notes (Signed)
 Pt to ED reporting centralized chest pain worse when lying flat and shortness of breath. Pt reports the shortness of breath has been worsening over the past 2 weeks with a pulse oximetry at PT reading 80% on RA. Pt to ED today because chest pain has worsened today.

## 2023-11-11 NOTE — Discharge Instructions (Addendum)
 You were evaluated in the emergency room for chest pain.  Your lab work and imaging did not show any significant abnormality.  If your symptoms persist please follow-up with your primary care doctor.  If you experience any new or worsening symptoms please return to emergency room.  A prescription for famotidine  was sent into your pharmacy to trial.

## 2023-11-13 ENCOUNTER — Other Ambulatory Visit (HOSPITAL_BASED_OUTPATIENT_CLINIC_OR_DEPARTMENT_OTHER): Payer: Self-pay

## 2023-11-15 ENCOUNTER — Other Ambulatory Visit: Payer: Self-pay | Admitting: Allergy

## 2023-11-15 DIAGNOSIS — J3089 Other allergic rhinitis: Secondary | ICD-10-CM

## 2023-11-15 DIAGNOSIS — H1013 Acute atopic conjunctivitis, bilateral: Secondary | ICD-10-CM

## 2023-11-15 DIAGNOSIS — J301 Allergic rhinitis due to pollen: Secondary | ICD-10-CM | POA: Diagnosis not present

## 2023-11-15 NOTE — Progress Notes (Signed)
 Aeroallergen Immunotherapy  Ordering Provider: Dr. Danita Brain  Patient Details Name: Avory Rahimi MRN: [id number] Date of Birth: [birth year]  Order 1 of 2  Vial Label: Pollen, pet  0.3 ml (Volume)  BAU Concentration -- 7 Grass Mix* 100,000 ([place]  Blue, [place], Orchard, Perennial Rye, RedTop, Sweet Vernal, Timothy) 0.2 ml (Volume)  1:20 Concentration -- Johnson 0.2 ml (Volume)  1:20 Concentration -- Rough Pigweed* 0.5 ml (Volume)  1:20 Concentration -- Eastern 10 Tree Mix (also Sweet Gum) 0.2 ml (Volume)  1:10 Concentration -- Pine Mix 0.2 ml (Volume)  1:20 Concentration -- Red Mulberry 0.5 ml (Volume)  1:10 Concentration -- Cat Hair 0.5 ml (Volume)  1:10 Concentration -- Dog Epithelia   2.6  ml Extract Subtotal 2.4  ml Diluent  5.0  ml Maintenance Total  Schedule:  A Silver Vial (1:1,000,000): Schedule A (10 doses) Blue Vial (1:100,000): Schedule A (10 doses) Yellow Vial (1:10,000): Schedule A (10 doses) Green Vial (1:1,000): Schedule A (10 doses) Red Vial (1:100): Schedule A (14 doses)  Special Instructions: Please start patient in Silver vial.   After completion of the first Red Vial, please space to every two weeks. After completion of the second Red Vial, please space to every 4 weeks. Ok to up dose new vials at 0.46mL --> 0.3 mL --> 0.5 mL.  Ok to come twice weekly, if desired, as long as there is 48 hours between injections.

## 2023-11-15 NOTE — Progress Notes (Signed)
 Aeroallergen Immunotherapy  Ordering Provider: Dr. Danita Brain  Patient Details Name: Lindsey [name] MRN: [id number] Date of Birth: Jul 21, 1965  Order 2 of 2  Vial Label: Mold, roach  0.2 ml (Volume)  1:20 Concentration -- Alternaria alternata 0.2 ml (Volume)  1:20 Concentration -- Cladosporium herbarum 0.2 ml (Volume)  1:10 Concentration -- Penicillium mix 0.2 ml (Volume)  1:20 Concentration -- Bipolaris sorokiniana 0.2 ml (Volume)  1:20 Concentration -- Drechslera spicifera 0.2 ml (Volume)  1:10 Concentration -- Fusarium moniliforme 0.2 ml (Volume)  1:40 Concentration -- Aureobasidium pullulans 0.2 ml (Volume)  1:10 Concentration -- Rhizopus oryzae 0.2 ml (Volume)  1:40 Concentration -- Epicoccum nigrum 0.2 ml (Volume)  1:40 Concentration -- Phoma betae 0.3 ml (Volume)  1:20 Concentration -- Cockroach, German   2.3  ml Extract Subtotal 2.7  ml Diluent  5.0  ml Maintenance Total  Schedule:  A Silver Vial (1:1,000,000): Schedule A (10 doses) Blue Vial (1:100,000): Schedule A (10 doses) Yellow Vial (1:10,000): Schedule A (10 doses) Green Vial (1:1,000): Schedule A (10 doses) Red Vial (1:100): Schedule A (14 doses)  Special Instructions: Please start patient in Silver vial.   After completion of the first Red Vial, please space to every two weeks. After completion of the second Red Vial, please space to every 4 weeks. Ok to up dose new vials at 0.63mL --> 0.3 mL --> 0.5 mL.  Ok to come twice weekly, if desired, as long as there is 48 hours between injections.

## 2023-11-15 NOTE — Progress Notes (Signed)
 VIALS MADE 11-15-23

## 2023-11-16 ENCOUNTER — Ambulatory Visit: Admitting: Family Medicine

## 2023-11-16 ENCOUNTER — Ambulatory Visit (HOSPITAL_BASED_OUTPATIENT_CLINIC_OR_DEPARTMENT_OTHER): Admitting: Physical Therapy

## 2023-11-16 DIAGNOSIS — J302 Other seasonal allergic rhinitis: Secondary | ICD-10-CM | POA: Diagnosis not present

## 2023-11-16 IMAGING — CT CT ABD-PELV W/ CM
2 of 5 series · 15 of 46 positions shown, 17 images · IV contrast (agent unspecified)
Comparison: 07/11/2020

CLINICAL DATA: Abdominal pain, diarrhea

EXAM:
CT ABDOMEN AND PELVIS WITH CONTRAST
TECHNIQUE: Multidetector CT imaging of the abdomen and pelvis was performed
using the standard protocol following bolus administration of
intravenous contrast.

[Series 2: axial st · axial · 0.77mm/px · z∈[+1079,+1459]mm · 12 of 90 slices shown, 14 images]
[im 7/90  soft-tissue]
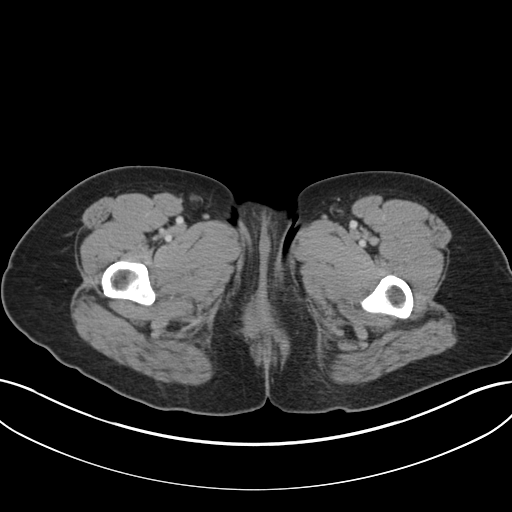
[im 7/90  bone]
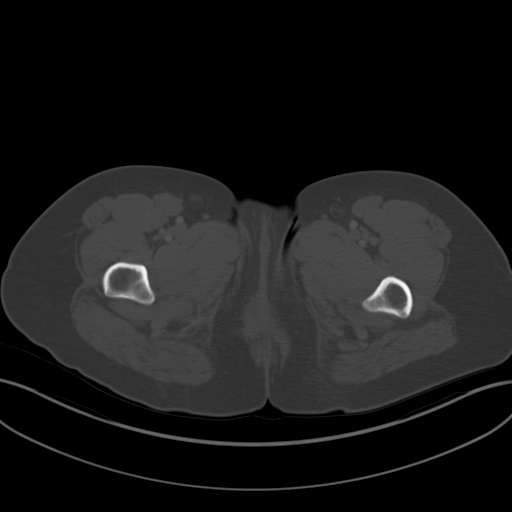
[im 14/90  soft-tissue]
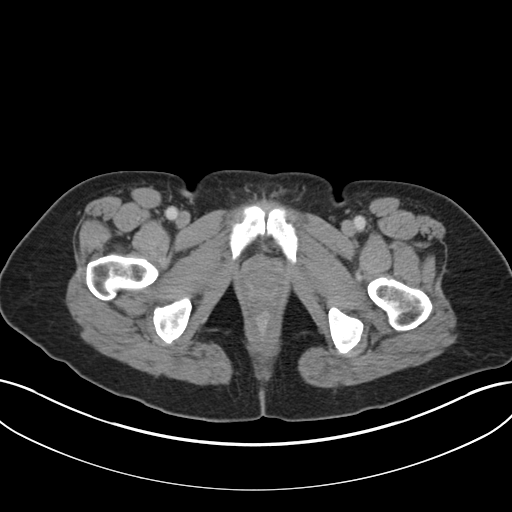
[im 21/90  soft-tissue]
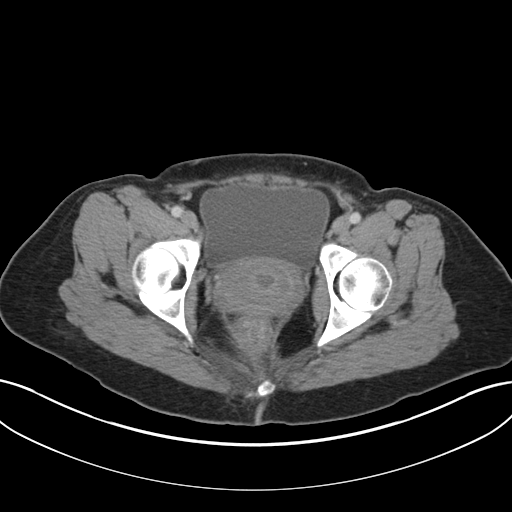
[im 28/90  soft-tissue]
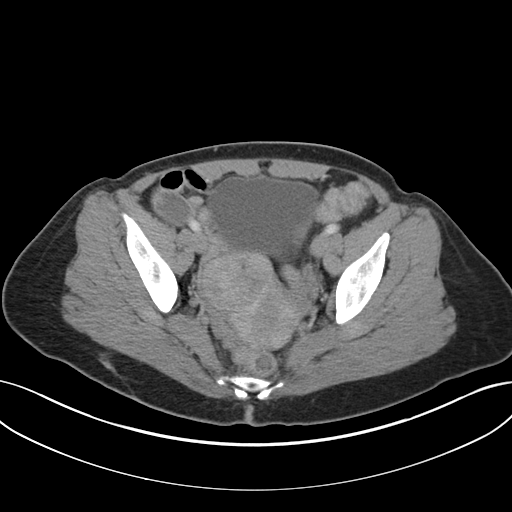
[im 35/90  soft-tissue]
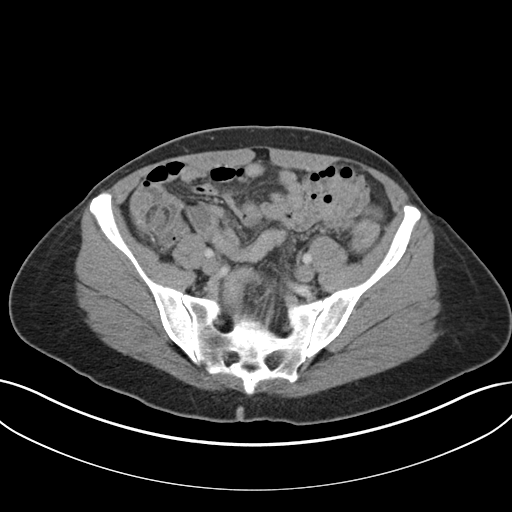
[im 42/90  soft-tissue]
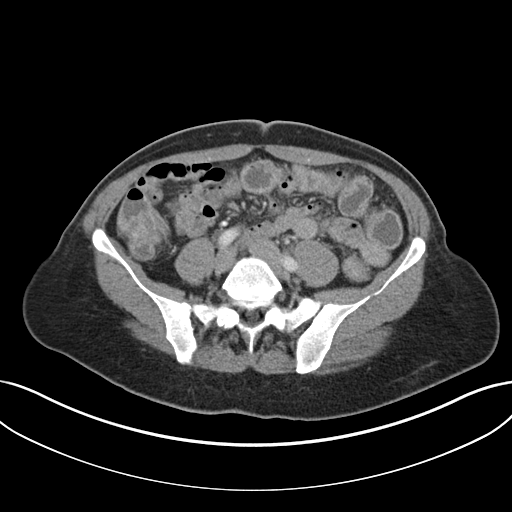
[im 48/90  soft-tissue]
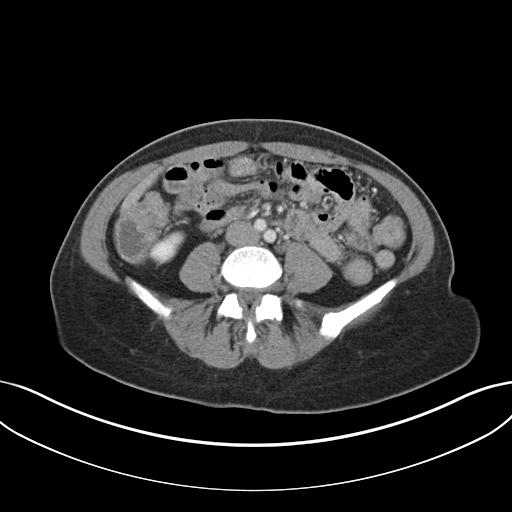
[im 55/90  soft-tissue]
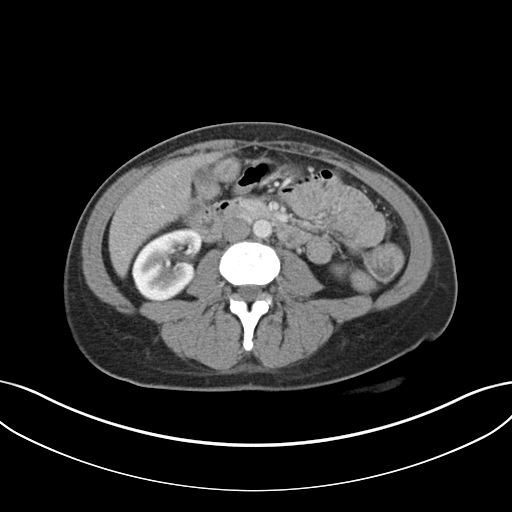
[im 62/90  soft-tissue]
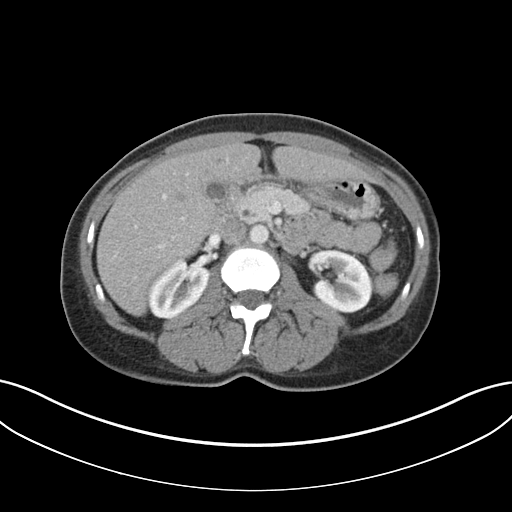
[im 62/90  bone]
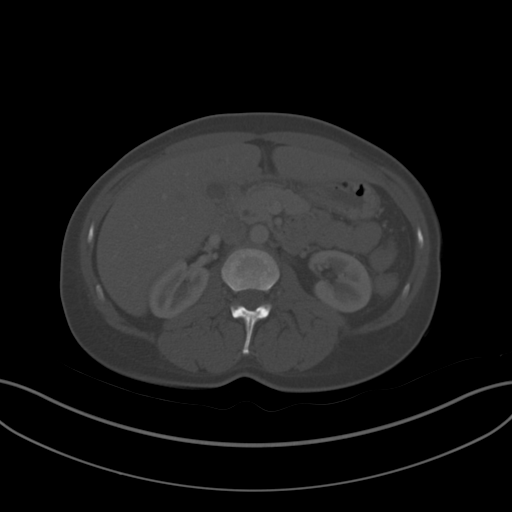
[im 69/90  soft-tissue]
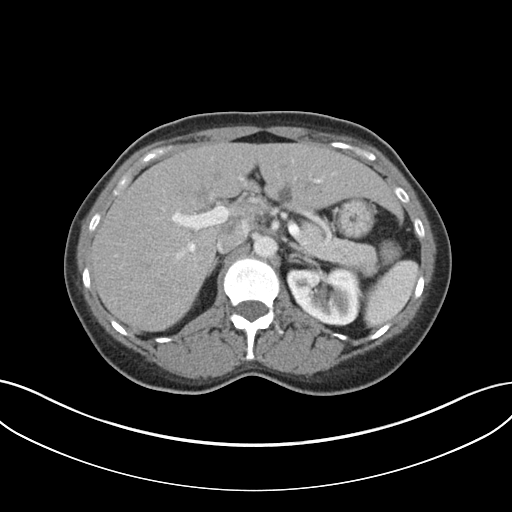
[im 76/90  soft-tissue]
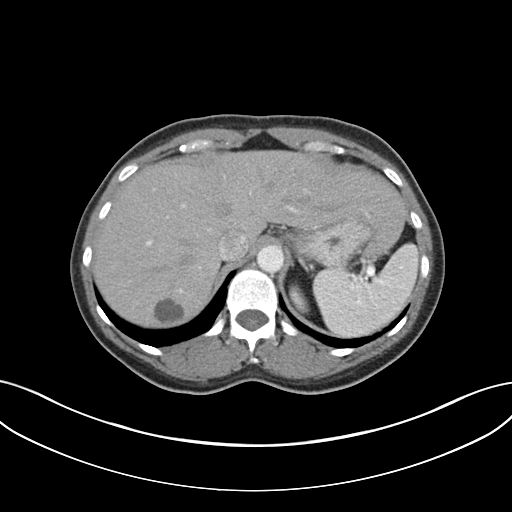
[im 83/90  soft-tissue]
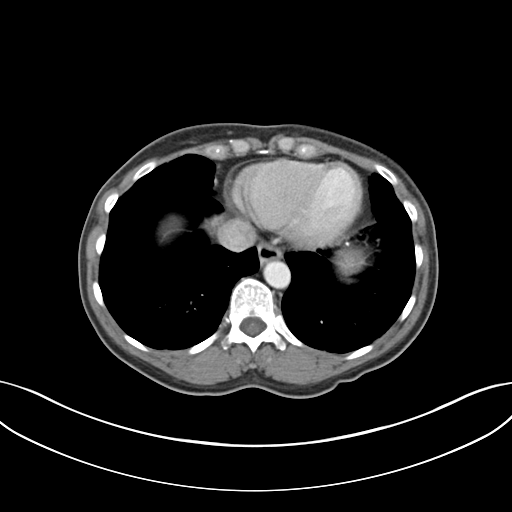

[Series 4: coronal st · coronal · 0.85mm/px · 3 of 117 slices shown]
[im 39/117  soft-tissue]
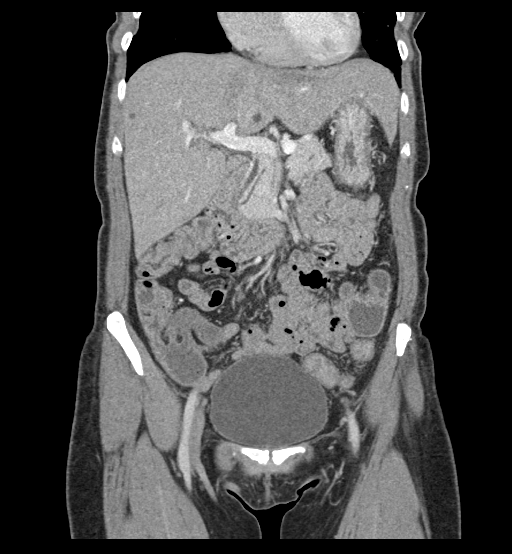
[im 52/117  soft-tissue]
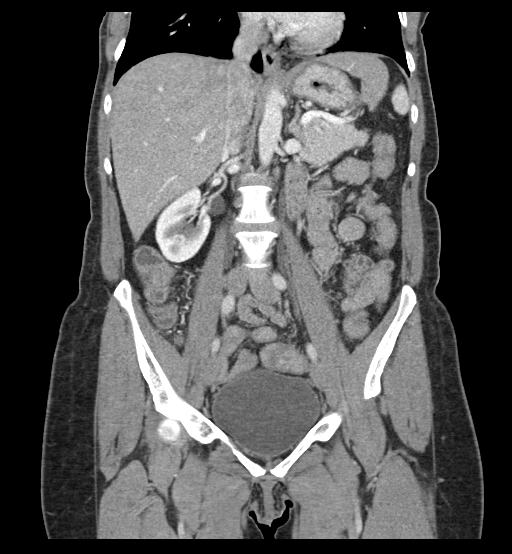
[im 65/117  soft-tissue]
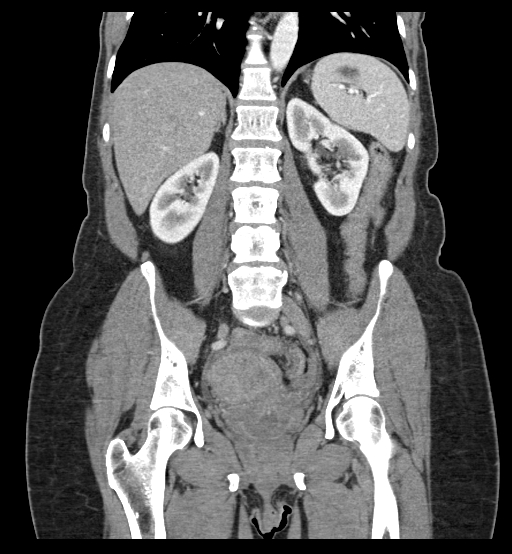

[15 of 46 positions shown; findings below may reference images not displayed]

RADIATION DOSE REDUCTION: This exam was performed according to the
departmental dose-optimization program which includes automated
exposure control, adjustment of the mA and/or kV according to
patient size and/or use of iterative reconstruction technique.

CONTRAST:  100mL OMNIPAQUE IOHEXOL 300 MG/ML  SOLN
FINDINGS: Lower chest: There are small linear densities in the lower lung
fields suggesting scarring or subsegmental atelectasis. Liver
measures 17.6 cm in length. There is no focal consolidation.

Hepatobiliary: Liver measures 17.6 cm in length. There are few
smooth marginated low-density lesions of varying sizes in the liver
with no significant change. Largest of these lesions is noted in the
posterior dome of right lobe measuring 2.4 cm. There is no dilation
of bile ducts. Gallbladder is not distended. There is subtle
increased density in the dependent portion of gallbladder suggesting
possible sludge and/or stones.

Pancreas: There is slight prominence of pancreatic duct. No focal
abnormality is seen.

Spleen: Unremarkable.

Adrenals/Urinary Tract: Adrenals are unremarkable. There is no
hydronephrosis. There are no renal or ureteral stones. Urinary
bladder is unremarkable.

Stomach/Bowel: Stomach is not distended. Small bowel loops are not
dilated. Appendix is not distinctly seen. There is no focal
pericecal inflammation. There is mild diffuse wall thickening in the
colon. There is no pericolic stranding or fluid collection. There is
mild diffuse wall thickening in the rectum.

Vascular/Lymphatic: Unremarkable.

Reproductive: Uterus is enlarged with fibroids. Largest of the
fibroids measures proximally 5.2 cm.

Other: There is no ascites or pneumoperitoneum. Small umbilical
hernia containing fat is seen.

Musculoskeletal: Unremarkable.
IMPRESSION: There is mild diffuse wall thickening in colon and rectum suggesting
possible inflammatory or infectious colitis. There is no loculated
pericolic fluid collection.

There is no evidence of intestinal obstruction or pneumoperitoneum.
There is no hydronephrosis.

There are few small smooth marginated low-density lesions in the
liver which have not changed significantly, possibly suggesting
cysts or hemangiomas. Liver is enlarged in size. Possible
gallbladder stones. Uterine fibroids.

Other findings as described in the body of the report.

## 2023-11-22 ENCOUNTER — Other Ambulatory Visit (HOSPITAL_BASED_OUTPATIENT_CLINIC_OR_DEPARTMENT_OTHER): Payer: Self-pay

## 2023-11-28 ENCOUNTER — Other Ambulatory Visit (HOSPITAL_BASED_OUTPATIENT_CLINIC_OR_DEPARTMENT_OTHER): Payer: Self-pay

## 2023-12-01 ENCOUNTER — Other Ambulatory Visit: Payer: Self-pay

## 2023-12-01 ENCOUNTER — Other Ambulatory Visit (HOSPITAL_BASED_OUTPATIENT_CLINIC_OR_DEPARTMENT_OTHER): Payer: Self-pay

## 2023-12-01 ENCOUNTER — Other Ambulatory Visit (HOSPITAL_COMMUNITY): Payer: Self-pay

## 2023-12-04 ENCOUNTER — Other Ambulatory Visit (HOSPITAL_COMMUNITY): Payer: Self-pay

## 2023-12-04 ENCOUNTER — Ambulatory Visit: Payer: Medicaid Other | Admitting: Internal Medicine

## 2023-12-04 ENCOUNTER — Other Ambulatory Visit: Payer: Self-pay

## 2023-12-05 ENCOUNTER — Ambulatory Visit: Admitting: Dermatology

## 2023-12-06 ENCOUNTER — Ambulatory Visit (INDEPENDENT_AMBULATORY_CARE_PROVIDER_SITE_OTHER)

## 2023-12-06 DIAGNOSIS — J309 Allergic rhinitis, unspecified: Secondary | ICD-10-CM

## 2023-12-14 ENCOUNTER — Ambulatory Visit (INDEPENDENT_AMBULATORY_CARE_PROVIDER_SITE_OTHER)

## 2023-12-14 DIAGNOSIS — J309 Allergic rhinitis, unspecified: Secondary | ICD-10-CM | POA: Diagnosis not present

## 2023-12-15 ENCOUNTER — Other Ambulatory Visit (HOSPITAL_BASED_OUTPATIENT_CLINIC_OR_DEPARTMENT_OTHER): Payer: Self-pay

## 2023-12-15 ENCOUNTER — Encounter: Payer: Self-pay | Admitting: Allergy

## 2023-12-18 ENCOUNTER — Other Ambulatory Visit (HOSPITAL_BASED_OUTPATIENT_CLINIC_OR_DEPARTMENT_OTHER): Payer: Self-pay

## 2023-12-18 MED ORDER — TRAMADOL HCL 50 MG PO TABS
50.0000 mg | ORAL_TABLET | Freq: Three times a day (TID) | ORAL | 0 refills | Status: DC | PRN
  Filled 2023-12-18 – 2023-12-20 (×2): qty 180, 30d supply, fill #0

## 2023-12-20 ENCOUNTER — Other Ambulatory Visit (HOSPITAL_BASED_OUTPATIENT_CLINIC_OR_DEPARTMENT_OTHER): Payer: Self-pay

## 2023-12-21 ENCOUNTER — Ambulatory Visit (INDEPENDENT_AMBULATORY_CARE_PROVIDER_SITE_OTHER)

## 2023-12-21 DIAGNOSIS — J309 Allergic rhinitis, unspecified: Secondary | ICD-10-CM

## 2023-12-27 ENCOUNTER — Ambulatory Visit (INDEPENDENT_AMBULATORY_CARE_PROVIDER_SITE_OTHER)

## 2023-12-27 ENCOUNTER — Other Ambulatory Visit: Payer: Self-pay | Admitting: Physician Assistant

## 2023-12-27 DIAGNOSIS — J309 Allergic rhinitis, unspecified: Secondary | ICD-10-CM

## 2023-12-27 DIAGNOSIS — D472 Monoclonal gammopathy: Secondary | ICD-10-CM

## 2023-12-28 ENCOUNTER — Other Ambulatory Visit: Payer: Self-pay | Admitting: *Deleted

## 2023-12-28 ENCOUNTER — Other Ambulatory Visit (HOSPITAL_COMMUNITY): Payer: Self-pay

## 2023-12-28 ENCOUNTER — Telehealth: Payer: Self-pay | Admitting: Hematology and Oncology

## 2023-12-28 ENCOUNTER — Other Ambulatory Visit: Payer: Self-pay

## 2023-12-28 ENCOUNTER — Other Ambulatory Visit (HOSPITAL_BASED_OUTPATIENT_CLINIC_OR_DEPARTMENT_OTHER): Payer: Self-pay

## 2023-12-28 MED ORDER — HYDROXYZINE PAMOATE 25 MG PO CAPS
25.0000 mg | ORAL_CAPSULE | Freq: Every day | ORAL | 0 refills | Status: DC
  Filled 2023-12-28: qty 30, 30d supply, fill #0

## 2023-12-29 ENCOUNTER — Inpatient Hospital Stay: Admitting: Hematology and Oncology

## 2023-12-29 ENCOUNTER — Inpatient Hospital Stay

## 2023-12-29 ENCOUNTER — Other Ambulatory Visit (HOSPITAL_COMMUNITY): Payer: Self-pay

## 2023-12-30 ENCOUNTER — Other Ambulatory Visit (HOSPITAL_COMMUNITY): Payer: Self-pay

## 2023-12-30 ENCOUNTER — Other Ambulatory Visit (HOSPITAL_BASED_OUTPATIENT_CLINIC_OR_DEPARTMENT_OTHER): Payer: Self-pay

## 2024-01-01 ENCOUNTER — Other Ambulatory Visit (HOSPITAL_COMMUNITY): Payer: Self-pay

## 2024-01-01 ENCOUNTER — Other Ambulatory Visit: Payer: Self-pay

## 2024-01-02 ENCOUNTER — Other Ambulatory Visit: Payer: Self-pay

## 2024-01-02 ENCOUNTER — Encounter: Payer: Self-pay | Admitting: Pharmacist

## 2024-01-03 ENCOUNTER — Ambulatory Visit (INDEPENDENT_AMBULATORY_CARE_PROVIDER_SITE_OTHER)

## 2024-01-03 DIAGNOSIS — J309 Allergic rhinitis, unspecified: Secondary | ICD-10-CM

## 2024-01-04 ENCOUNTER — Other Ambulatory Visit (HOSPITAL_COMMUNITY): Payer: Self-pay

## 2024-01-05 ENCOUNTER — Ambulatory Visit: Admitting: Hematology and Oncology

## 2024-01-05 ENCOUNTER — Other Ambulatory Visit

## 2024-01-05 ENCOUNTER — Other Ambulatory Visit: Payer: Self-pay

## 2024-01-05 ENCOUNTER — Other Ambulatory Visit (HOSPITAL_COMMUNITY): Payer: Self-pay

## 2024-01-09 ENCOUNTER — Ambulatory Visit (HOSPITAL_BASED_OUTPATIENT_CLINIC_OR_DEPARTMENT_OTHER): Admitting: Family Medicine

## 2024-01-10 ENCOUNTER — Ambulatory Visit (INDEPENDENT_AMBULATORY_CARE_PROVIDER_SITE_OTHER)

## 2024-01-10 DIAGNOSIS — J309 Allergic rhinitis, unspecified: Secondary | ICD-10-CM | POA: Diagnosis not present

## 2024-01-11 ENCOUNTER — Other Ambulatory Visit (HOSPITAL_COMMUNITY): Payer: Self-pay

## 2024-01-11 ENCOUNTER — Ambulatory Visit (HOSPITAL_COMMUNITY)
Admission: RE | Admit: 2024-01-11 | Discharge: 2024-01-11 | Disposition: A | Discharge status: Home / Self Care | Source: Ambulatory Visit | Attending: Physician Assistant | Admitting: Physician Assistant

## 2024-01-11 DIAGNOSIS — D472 Monoclonal gammopathy: Secondary | ICD-10-CM | POA: Insufficient documentation

## 2024-01-12 ENCOUNTER — Other Ambulatory Visit (HOSPITAL_COMMUNITY): Payer: Self-pay

## 2024-01-15 ENCOUNTER — Other Ambulatory Visit (HOSPITAL_COMMUNITY): Payer: Self-pay

## 2024-01-16 ENCOUNTER — Other Ambulatory Visit: Payer: Self-pay

## 2024-01-16 ENCOUNTER — Other Ambulatory Visit (HOSPITAL_COMMUNITY): Payer: Self-pay

## 2024-01-16 MED ORDER — DULOXETINE HCL 40 MG PO CPEP
40.0000 mg | ORAL_CAPSULE | Freq: Every morning | ORAL | 1 refills | Status: AC
  Filled 2024-01-16: qty 30, 30d supply, fill #0

## 2024-01-17 ENCOUNTER — Encounter: Payer: Self-pay | Admitting: Pharmacist

## 2024-01-17 ENCOUNTER — Other Ambulatory Visit: Payer: Self-pay

## 2024-01-18 ENCOUNTER — Other Ambulatory Visit: Payer: Self-pay | Admitting: *Deleted

## 2024-01-18 ENCOUNTER — Inpatient Hospital Stay

## 2024-01-18 ENCOUNTER — Other Ambulatory Visit (HOSPITAL_BASED_OUTPATIENT_CLINIC_OR_DEPARTMENT_OTHER): Payer: Self-pay

## 2024-01-18 ENCOUNTER — Inpatient Hospital Stay: Attending: Hematology and Oncology | Admitting: Hematology and Oncology

## 2024-01-18 VITALS — BP 112/88 | HR 85 | Temp 97.3°F | Resp 18 | Wt 130.2 lb

## 2024-01-18 DIAGNOSIS — Z800 Family history of malignant neoplasm of digestive organs: Secondary | ICD-10-CM | POA: Insufficient documentation

## 2024-01-18 DIAGNOSIS — Z803 Family history of malignant neoplasm of breast: Secondary | ICD-10-CM | POA: Insufficient documentation

## 2024-01-18 DIAGNOSIS — R7689 Other specified abnormal immunological findings in serum: Secondary | ICD-10-CM | POA: Insufficient documentation

## 2024-01-18 DIAGNOSIS — D472 Monoclonal gammopathy: Secondary | ICD-10-CM | POA: Insufficient documentation

## 2024-01-18 LAB — CBC WITH DIFFERENTIAL (CANCER CENTER ONLY)
Abs Immature Granulocytes: 0.01 K/uL (ref 0.00–0.07)
Basophils Absolute: 0 K/uL (ref 0.0–0.1)
Basophils Relative: 1 %
Eosinophils Absolute: 0 K/uL (ref 0.0–0.5)
Eosinophils Relative: 1 %
HCT: 42.1 % (ref 36.0–46.0)
Hemoglobin: 14.8 g/dL (ref 12.0–15.0)
Immature Granulocytes: 0 %
Lymphocytes Relative: 18 %
Lymphs Abs: 1 K/uL (ref 0.7–4.0)
MCH: 31.2 pg (ref 26.0–34.0)
MCHC: 35.2 g/dL (ref 30.0–36.0)
MCV: 88.8 fL (ref 80.0–100.0)
Monocytes Absolute: 0.4 K/uL (ref 0.1–1.0)
Monocytes Relative: 6 %
Neutro Abs: 4.3 K/uL (ref 1.7–7.7)
Neutrophils Relative %: 74 %
Platelet Count: 291 K/uL (ref 150–400)
RBC: 4.74 MIL/uL (ref 3.87–5.11)
RDW: 12.2 % (ref 11.5–15.5)
WBC Count: 5.7 K/uL (ref 4.0–10.5)
nRBC: 0 % (ref 0.0–0.2)

## 2024-01-18 LAB — CMP (CANCER CENTER ONLY)
ALT: 19 U/L (ref 0–44)
AST: 21 U/L (ref 15–41)
Albumin: 3.9 g/dL (ref 3.5–5.0)
Alkaline Phosphatase: 58 U/L (ref 38–126)
Anion gap: 6 (ref 5–15)
BUN: 11 mg/dL (ref 6–20)
CO2: 24 mmol/L (ref 22–32)
Calcium: 9.3 mg/dL (ref 8.9–10.3)
Chloride: 107 mmol/L (ref 98–111)
Creatinine: 1.06 mg/dL — ABNORMAL HIGH (ref 0.44–1.00)
GFR, Estimated: >60 mL/min (ref >60)
Glucose, Bld: 88 mg/dL (ref 70–99)
Potassium: 4.1 mmol/L (ref 3.5–5.1)
Sodium: 137 mmol/L (ref 135–145)
Total Bilirubin: 0.4 mg/dL (ref 0.0–1.2)
Total Protein: 7.8 g/dL (ref 6.5–8.1)
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
## 2024-01-19 LAB — KAPPA/LAMBDA LIGHT CHAINS
Kappa free light chain: 9.3 mg/L (ref 3.3–19.4)
Kappa, lambda light chain ratio: 0.38 (ref 0.26–1.65)
Lambda free light chains: 24.8 mg/L (ref 5.7–26.3)

## 2024-01-22 ENCOUNTER — Telehealth: Payer: Self-pay

## 2024-01-22 NOTE — Telephone Encounter (Signed)
 [facility] Clinical Social Work  Clinical Social Work was referred by medical provider for need for community resources and SDOH needs.  Clinical Social Worker attempted to contact patient by phone to offer support and assess for needs.    Interventions: CSW left voicemail requesting call back.   Follow Up Plan:  CSW will make another attempt to reach patient.    Lizbeth Sprague, LCSW  Clinical Social Worker [facility]

## 2024-01-23 ENCOUNTER — Telehealth: Payer: Self-pay

## 2024-01-23 LAB — MULTIPLE MYELOMA PANEL, SERUM
Albumin SerPl Elph-Mcnc: 3.4 g/dL (ref 2.9–4.4)
Albumin/Glob SerPl: 1 (ref 0.7–1.7)
Alpha 1: 0.2 g/dL (ref 0.0–0.4)
Alpha2 Glob SerPl Elph-Mcnc: 0.6 g/dL (ref 0.4–1.0)
B-Globulin SerPl Elph-Mcnc: 1.6 g/dL — ABNORMAL HIGH (ref 0.7–1.3)
Gamma Glob SerPl Elph-Mcnc: 1.2 g/dL (ref 0.4–1.8)
Globulin, Total: 3.6 g/dL (ref 2.2–3.9)
IgA: 1228 mg/dL — ABNORMAL HIGH (ref 87–352)
IgG (Immunoglobin G), Serum: 1328 mg/dL (ref 586–1602)
IgM (Immunoglobulin M), Srm: 28 mg/dL (ref 26–217)
M Protein SerPl Elph-Mcnc: 1.1 g/dL — ABNORMAL HIGH
Total Protein ELP: 7 g/dL (ref 6.0–8.5)

## 2024-01-23 NOTE — Telephone Encounter (Signed)
 [facility] Clinical Social Work  Clinical Social Work was referred by nurse for need for [employer] and emotional support.  Clinical Social Worker attempted to contact patient by phone to offer support and assess for needs.   Patient left Vm on 10/13 returning missed call. Interventions: CSW attempted to reach patient again. No Answer. CSW scheduled a telephone call appointment in hopes to reach patient. Patient notified via vm.     Follow Up Plan:  CSW will follow-up with patient by phone     Lizbeth Sprague, LCSW  Clinical Social Worker [facility]

## 2024-01-24 ENCOUNTER — Inpatient Hospital Stay

## 2024-01-24 ENCOUNTER — Ambulatory Visit (INDEPENDENT_AMBULATORY_CARE_PROVIDER_SITE_OTHER)

## 2024-01-24 ENCOUNTER — Telehealth: Payer: Self-pay | Admitting: Dietician

## 2024-01-24 DIAGNOSIS — J309 Allergic rhinitis, unspecified: Secondary | ICD-10-CM

## 2024-01-24 LAB — UPEP/UIFE/LIGHT CHAINS/TP, 24-HR UR
% BETA, Urine: 0 %
ALPHA 1 URINE: 0 %
Albumin, U: 100 %
Alpha 2, Urine: 0 %
Free Kappa Lt Chains,Ur: 3.89 mg/L (ref 1.17–86.46)
Free Kappa/Lambda Ratio: 4.18 (ref 1.83–14.26)
Free Lambda Lt Chains,Ur: 0.93 mg/L (ref 0.27–15.21)
GAMMA GLOBULIN URINE: 0 %
Total Protein, Urine-Ur/day: 130 mg/(24.h) (ref 30–150)
Total Protein, Urine: 4.2 mg/dL

## 2024-01-24 NOTE — Progress Notes (Signed)
 [facility] Clinical Social Work  Clinical Social Work was referred by Engineer, civil (consulting) for need for [employer].  Clinical Social Worker contacted patient by phone to offer support and assess for needs.     Interventions: Referred patient to community resources: [employer] and Housing Studies, [employer], Meals on [employer] ([facility]), English as a second language teacher / [employer] (sent referral through [place])  Provided patient with information about [place] [employer], sent email to insurance company to see if assistance is available.  Provided information on the Brien Ferretti (awaiting return documents)  Contacted billing department for more information regarding applications.      Follow Up Plan:  CSW continues to follow patient.     Lizbeth Sprague, LCSW  Clinical Social Worker [facility]

## 2024-01-24 NOTE — Telephone Encounter (Signed)
 Patient screened on MST. First attempt to reach. It sounded like she picked up but didn't respond when I said hello, and call disconnected.  Provided my contact info in text to return call to set up a nutrition consult.  Micheline Craven, RDN, LDN Registered Dietitian, [facility] Part Time Remote (Usual office hours: Tuesday-Thursday) Cell: [phone]

## 2024-01-25 NOTE — Progress Notes (Signed)
 [facility] Clinical Social Work  Clinical Social Work was referred by Engineer, civil (consulting) for need for [employer].     Update on Interventions: Patient is not eligible for meals through Meals on Wheels, [employer], and [employer].   [employer] does not have financial assistance available to help patient with utilities.  Relayed information about [facility] Assistance Decision.  CSW reviewed submitted documents for [employer], awaiting signed form and approval letter for [facility].    Follow Up Plan:  CSW continues to follow patient.    Lizbeth Sprague, LCSW  Clinical Social Worker [facility]

## 2024-01-29 ENCOUNTER — Encounter: Payer: Self-pay | Admitting: Psychology

## 2024-01-29 NOTE — Progress Notes (Signed)
 [facility] CSW Progress Note  Referral placed for [employer] (formally LLS). CSW awaiting documentation for the [employer].    Lizbeth Sprague, LCSW Clinical Social Worker [facility]

## 2024-01-29 NOTE — Telephone Encounter (Signed)
 Kate Star RN,BSN,CCM Ambulatory Nurse Navigator [facility] [place], [place] [address] Office [phone]

## 2024-01-30 ENCOUNTER — Other Ambulatory Visit (HOSPITAL_BASED_OUTPATIENT_CLINIC_OR_DEPARTMENT_OTHER): Payer: Self-pay

## 2024-01-31 ENCOUNTER — Other Ambulatory Visit (HOSPITAL_COMMUNITY): Payer: Self-pay

## 2024-01-31 ENCOUNTER — Other Ambulatory Visit (HOSPITAL_BASED_OUTPATIENT_CLINIC_OR_DEPARTMENT_OTHER): Payer: Self-pay

## 2024-01-31 ENCOUNTER — Telehealth: Payer: Self-pay | Admitting: Dietician

## 2024-01-31 NOTE — Telephone Encounter (Signed)
 Patient returned message from [facility] screen.  She reports now weight loss has exceeded 25#s.  Set up appointment to talk remotely next week.  Micheline Craven, RDN, LDN Registered Dietitian, [facility] Part Time Remote (Usual office hours: Tuesday-Thursday) Mobile: [phone] Remote Office: [phone]

## 2024-02-02 ENCOUNTER — Other Ambulatory Visit (HOSPITAL_COMMUNITY): Payer: Self-pay

## 2024-02-02 MED ORDER — HYDROXYZINE PAMOATE 25 MG PO CAPS
25.0000 mg | ORAL_CAPSULE | Freq: Every evening | ORAL | 1 refills | Status: AC
  Filled 2024-02-02: qty 30, 30d supply, fill #0

## 2024-02-05 ENCOUNTER — Other Ambulatory Visit (HOSPITAL_COMMUNITY): Payer: Self-pay

## 2024-02-05 ENCOUNTER — Encounter: Payer: Self-pay | Admitting: Pharmacist

## 2024-02-05 ENCOUNTER — Other Ambulatory Visit: Payer: Self-pay

## 2024-02-06 NOTE — ACP (Advance Care Planning) (Signed)
 Discussed ACP with member. Educated member about the importance of having a Engineer, Site of Attorney/Agent and Merchant Navy Officer. Denies need for further information at this time.

## 2024-02-07 ENCOUNTER — Inpatient Hospital Stay: Admitting: Dietician
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
## 2024-02-08 ENCOUNTER — Other Ambulatory Visit: Payer: Self-pay

## 2024-02-09 ENCOUNTER — Other Ambulatory Visit (HOSPITAL_COMMUNITY): Payer: Self-pay

## 2024-02-09 ENCOUNTER — Other Ambulatory Visit (HOSPITAL_BASED_OUTPATIENT_CLINIC_OR_DEPARTMENT_OTHER): Payer: Self-pay

## 2024-02-13 ENCOUNTER — Ambulatory Visit (INDEPENDENT_AMBULATORY_CARE_PROVIDER_SITE_OTHER)

## 2024-02-13 DIAGNOSIS — J309 Allergic rhinitis, unspecified: Secondary | ICD-10-CM | POA: Diagnosis not present

## 2024-02-14 ENCOUNTER — Other Ambulatory Visit: Payer: Self-pay | Admitting: General Surgery

## 2024-02-14 DIAGNOSIS — Z9189 Other specified personal risk factors, not elsewhere classified: Secondary | ICD-10-CM

## 2024-02-14 DIAGNOSIS — N6092 Unspecified benign mammary dysplasia of left breast: Secondary | ICD-10-CM

## 2024-02-14 DIAGNOSIS — R921 Mammographic calcification found on diagnostic imaging of breast: Secondary | ICD-10-CM

## 2024-02-15 ENCOUNTER — Other Ambulatory Visit (HOSPITAL_COMMUNITY): Payer: Self-pay

## 2024-02-15 ENCOUNTER — Other Ambulatory Visit (HOSPITAL_BASED_OUTPATIENT_CLINIC_OR_DEPARTMENT_OTHER): Payer: Self-pay

## 2024-02-16 NOTE — Progress Notes (Signed)
 [facility] Clinical Social Work  Clinical Social Work was originally referred by engineer, civil (consulting) for need for [employer] and SDOH needs.  Clinical Child Psychotherapist (CSW) assisting patient with locating community resources for financial strain. CSW has explored known resources to assist with food insecurity. CSW has been assisting patient with applying for the [facility] through [facility]. Since initial contact, patient has been working with a Social Work Nurse, Adult with [employer] to address need for DME / PCS, brief mention of Food Insecurity resources. Review of SDOH screener displays change in reported needs.   Interventions Patient submitted final document for the [employer] via email to CSW. CSW sent documents to [facility] Manager for review. CSW sent email to patient (verbal authorization given) confirming status. CSW informed patient timeframe for approval is unknown. CSW will keep patient updated.   Previous Interventions: Referred patient to community resources: [employer] (Not eligible), Meals on Wheels (Not Eligible), Tracie Schmitz (unknown status update from pt), English [employer] Language [employer] (Not eligible).        Follow Up Plan:  CSW will follow up with patient once status is known.   Lizbeth Sprague, LCSW  Clinical Social Worker [facility]

## 2024-02-17 ENCOUNTER — Encounter: Payer: Self-pay | Admitting: Allergy

## 2024-02-19 ENCOUNTER — Other Ambulatory Visit (HOSPITAL_COMMUNITY): Payer: Self-pay

## 2024-02-19 ENCOUNTER — Other Ambulatory Visit (HOSPITAL_BASED_OUTPATIENT_CLINIC_OR_DEPARTMENT_OTHER): Payer: Self-pay

## 2024-02-19 MED ORDER — TRAMADOL HCL 50 MG PO TABS
50.0000 mg | ORAL_TABLET | Freq: Three times a day (TID) | ORAL | 0 refills | Status: DC | PRN
  Filled 2024-02-19 – 2024-02-23 (×2): qty 180, 30d supply, fill #0

## 2024-02-19 MED ORDER — MONTELUKAST SODIUM 10 MG PO TABS: 10.0000 mg | ORAL_TABLET | Freq: Every day | ORAL | 1 refills | Status: DC

## 2024-02-19 MED ORDER — LEVOCETIRIZINE DIHYDROCHLORIDE 5 MG PO TABS: 5.0000 mg | ORAL_TABLET | Freq: Every day | ORAL | 5 refills | Status: DC

## 2024-02-21 ENCOUNTER — Other Ambulatory Visit (HOSPITAL_BASED_OUTPATIENT_CLINIC_OR_DEPARTMENT_OTHER): Payer: Self-pay

## 2024-02-21 ENCOUNTER — Other Ambulatory Visit (HOSPITAL_COMMUNITY): Payer: Self-pay

## 2024-02-23 ENCOUNTER — Other Ambulatory Visit (HOSPITAL_BASED_OUTPATIENT_CLINIC_OR_DEPARTMENT_OTHER): Payer: Self-pay

## 2024-02-26 ENCOUNTER — Ambulatory Visit (INDEPENDENT_AMBULATORY_CARE_PROVIDER_SITE_OTHER)

## 2024-02-26 ENCOUNTER — Ambulatory Visit
Admission: RE | Admit: 2024-02-26 | Discharge: 2024-02-26 | Disposition: A | Discharge status: Home / Self Care | Source: Ambulatory Visit | Attending: General Surgery | Admitting: General Surgery

## 2024-02-26 DIAGNOSIS — R921 Mammographic calcification found on diagnostic imaging of breast: Secondary | ICD-10-CM

## 2024-02-26 DIAGNOSIS — J309 Allergic rhinitis, unspecified: Secondary | ICD-10-CM

## 2024-02-26 DIAGNOSIS — N6092 Unspecified benign mammary dysplasia of left breast: Secondary | ICD-10-CM

## 2024-02-26 DIAGNOSIS — Z9189 Other specified personal risk factors, not elsewhere classified: Secondary | ICD-10-CM

## 2024-02-26 MED ORDER — IOPAMIDOL (ISOVUE-370) INJECTION 76%
100.0000 mL | Freq: Once | INTRAVENOUS | Status: AC | PRN
  Administered 2024-02-26: 100 mL via INTRAVENOUS

## 2024-02-28 ENCOUNTER — Other Ambulatory Visit (HOSPITAL_BASED_OUTPATIENT_CLINIC_OR_DEPARTMENT_OTHER): Payer: Self-pay

## 2024-02-29 ENCOUNTER — Encounter

## 2024-03-04 ENCOUNTER — Other Ambulatory Visit (HOSPITAL_BASED_OUTPATIENT_CLINIC_OR_DEPARTMENT_OTHER): Payer: Self-pay

## 2024-03-04 MED ORDER — FLUZONE 0.5 ML IM SUSY
0.5000 mL | PREFILLED_SYRINGE | Freq: Once | INTRAMUSCULAR | 0 refills | Status: AC
  Filled 2024-03-04: qty 0.5, 1d supply, fill #0

## 2024-03-14 ENCOUNTER — Ambulatory Visit

## 2024-03-14 DIAGNOSIS — J309 Allergic rhinitis, unspecified: Secondary | ICD-10-CM

## 2024-03-18 NOTE — Progress Notes (Signed)
 [facility] CSW Progress Note  Clinical Child Psychotherapist  provided [employer].    Lizbeth Sprague, LCSW Clinical Social Worker [facility]

## 2024-03-19 ENCOUNTER — Other Ambulatory Visit (HOSPITAL_BASED_OUTPATIENT_CLINIC_OR_DEPARTMENT_OTHER): Payer: Self-pay

## 2024-03-25 ENCOUNTER — Ambulatory Visit

## 2024-03-25 DIAGNOSIS — J309 Allergic rhinitis, unspecified: Secondary | ICD-10-CM | POA: Diagnosis not present

## 2024-03-26 ENCOUNTER — Ambulatory Visit: Admitting: Dermatology

## 2024-03-26 NOTE — Patient Instructions (Incomplete)
 Allergic rhinitis with conjunctivitis Asthma Contact dermatitis Elevated IgA --> diagnosed and being monitored for smoldering myeloma Low IgG, IgM with poor vaccine response --> diagnosed with CVID now on IgG replacement therapy managed by Dr Tobie ([facility]) Ehlers-Danlos syndrome, POTS -Continue allergy  injections per protocol and have access to your epinephrine  auto injector device -Continue daily Singulair  -Continue Allegra  and Xyzal  daily -Continue to have access to epinephrine  device.   Continue nasal saline rinses 1-2 times daily as needed.  -Have access to Symbicort  80 mcg 2 puffs every 4 hours as needed, max 12 puffs/day. Can also use 2 puffs prior to exercise if that cause symptoms. Or can use 1 vial of albuterol  every 4 hours via nebulizer. -  -She will continue to follow with heme/onc for smoldering myeloma management -She will continue to follow with immunology for IgG replacement therapy  Follow-up for routine visit in about  months or sooner if needed.

## 2024-03-27 ENCOUNTER — Ambulatory Visit: Admitting: Family

## 2024-03-29 ENCOUNTER — Other Ambulatory Visit (HOSPITAL_BASED_OUTPATIENT_CLINIC_OR_DEPARTMENT_OTHER): Payer: Self-pay

## 2024-04-01 ENCOUNTER — Encounter: Attending: Psychology | Admitting: Psychology

## 2024-04-01 NOTE — Patient Instructions (Incomplete)
 Allergic rhinitis with conjunctivitis Asthma Contact dermatitis Elevated IgA --> diagnosed and being monitored for smoldering myeloma Low IgG, IgM with poor vaccine response --> diagnosed with CVID now on IgG replacement therapy managed by Dr Tobie ([facility]) Ehlers-Danlos syndrome, POTS -Continue allergy  injections per protocol and have access to your epinephrine  auto injector device.  Will hold off on getting your allergy  injection today due to your dizziness/lightheadedness -Continue daily Singulair  -Continue Allegra  and Xyzal  daily -Continue to have access to epinephrine  device.   Continue nasal saline rinses 1-2 times daily as needed.  -Start Symbicort  80 mcg 2 puffs twice a day with spacer to help prevent cough and wheeze. - May use albuterol  2 puffs every 4-6 hours as needed for cough, wheeze, tightness in chest, or shortness of breath Or can use 1 vial of albuterol  every 4 hours via nebulizer. -hold off on using any nasal sprays for the next 5-7 days due to nasal irritation  -She will continue to follow with heme/onc for smoldering myeloma management -She will continue to follow with immunology for IgG replacement therapy  Follow-up  6 weeks or sooner if needed.
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
## 2024-04-02 ENCOUNTER — Other Ambulatory Visit: Payer: Self-pay

## 2024-04-02 ENCOUNTER — Encounter: Payer: Self-pay | Admitting: Family

## 2024-04-02 ENCOUNTER — Emergency Department (HOSPITAL_COMMUNITY)
Admission: EM | Admit: 2024-04-02 | Discharge: 2024-04-02 | Discharge status: Against Medical Advice | Attending: Emergency Medicine | Admitting: Emergency Medicine

## 2024-04-02 ENCOUNTER — Other Ambulatory Visit (HOSPITAL_COMMUNITY): Payer: Self-pay

## 2024-04-02 ENCOUNTER — Ambulatory Visit: Admitting: Family

## 2024-04-02 ENCOUNTER — Other Ambulatory Visit (HOSPITAL_BASED_OUTPATIENT_CLINIC_OR_DEPARTMENT_OTHER): Payer: Self-pay

## 2024-04-02 ENCOUNTER — Encounter: Payer: Self-pay | Admitting: Allergy

## 2024-04-02 ENCOUNTER — Encounter (HOSPITAL_COMMUNITY): Payer: Self-pay | Admitting: Emergency Medicine

## 2024-04-02 VITALS — BP 100/80 | HR 90 | Temp 98.3°F

## 2024-04-02 DIAGNOSIS — Z5321 Procedure and treatment not carried out due to patient leaving prior to being seen by health care provider: Secondary | ICD-10-CM | POA: Insufficient documentation

## 2024-04-02 DIAGNOSIS — F419 Anxiety disorder, unspecified: Secondary | ICD-10-CM | POA: Insufficient documentation

## 2024-04-02 DIAGNOSIS — G8929 Other chronic pain: Secondary | ICD-10-CM | POA: Diagnosis not present

## 2024-04-02 DIAGNOSIS — D472 Monoclonal gammopathy: Secondary | ICD-10-CM | POA: Diagnosis not present

## 2024-04-02 DIAGNOSIS — J4530 Mild persistent asthma, uncomplicated: Secondary | ICD-10-CM | POA: Diagnosis not present

## 2024-04-02 DIAGNOSIS — D801 Nonfamilial hypogammaglobulinemia: Secondary | ICD-10-CM

## 2024-04-02 DIAGNOSIS — J3089 Other allergic rhinitis: Secondary | ICD-10-CM

## 2024-04-02 DIAGNOSIS — I959 Hypotension, unspecified: Secondary | ICD-10-CM | POA: Insufficient documentation

## 2024-04-02 MED ORDER — EPINEPHRINE 0.3 MG/0.3ML IJ SOAJ
0.3000 mg | INTRAMUSCULAR | 1 refills | Status: AC | PRN
  Filled 2024-04-02: qty 2, 30d supply, fill #0
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
## 2024-04-02 NOTE — ED Triage Notes (Signed)
 Pt arrives w/ [facility] from the allergy  clinic w/ c/o hypotension, anxiety & chronic pain. Pt was 100/60 initially. Pt reports she has multiple complaints including wanting a breathing treatment, medication for anxiety and pain.

## 2024-04-02 NOTE — ED Notes (Signed)
 Pt no longer in ED lobby. When asked, visitors who were sitting next to Pt, stated that the Pt got up and left the ED lobby.

## 2024-04-02 NOTE — ED Notes (Addendum)
 Pt stated I'll just leave when advised that she was being moved to the ED Lobby until an room was available. Pt was advised to see a provider but also that she had the right to leave at anytime.

## 2024-04-03 ENCOUNTER — Other Ambulatory Visit: Payer: Self-pay

## 2024-04-03 ENCOUNTER — Other Ambulatory Visit (HOSPITAL_COMMUNITY): Payer: Self-pay

## 2024-04-03 MED ORDER — PROGESTERONE 200 MG PO CAPS
200.0000 mg | ORAL_CAPSULE | Freq: Every evening | ORAL | 3 refills | Status: AC
  Filled 2024-04-03: qty 270, 90d supply, fill #0

## 2024-04-05 ENCOUNTER — Encounter: Payer: Self-pay | Admitting: Pharmacist

## 2024-04-05 ENCOUNTER — Other Ambulatory Visit: Payer: Self-pay

## 2024-04-07 ENCOUNTER — Other Ambulatory Visit (HOSPITAL_BASED_OUTPATIENT_CLINIC_OR_DEPARTMENT_OTHER): Payer: Self-pay

## 2024-04-08 ENCOUNTER — Other Ambulatory Visit (HOSPITAL_BASED_OUTPATIENT_CLINIC_OR_DEPARTMENT_OTHER): Payer: Self-pay

## 2024-04-08 ENCOUNTER — Other Ambulatory Visit (HOSPITAL_COMMUNITY): Payer: Self-pay

## 2024-04-08 MED ORDER — TRAMADOL HCL 50 MG PO TABS
ORAL_TABLET | ORAL | 0 refills | Status: AC
  Filled 2024-04-08: qty 180, 30d supply, fill #0

## 2024-04-09 ENCOUNTER — Other Ambulatory Visit: Payer: Self-pay

## 2024-04-09 ENCOUNTER — Other Ambulatory Visit: Payer: Self-pay | Admitting: *Deleted

## 2024-04-09 MED ORDER — IPRATROPIUM BROMIDE 0.06 % NA SOLN
2.0000 | Freq: Four times a day (QID) | NASAL | 5 refills | Status: AC | PRN
  Filled 2024-04-09: qty 15, 19d supply, fill #0

## 2024-04-16 ENCOUNTER — Other Ambulatory Visit (HOSPITAL_BASED_OUTPATIENT_CLINIC_OR_DEPARTMENT_OTHER): Payer: Self-pay

## 2024-04-16 DIAGNOSIS — J309 Allergic rhinitis, unspecified: Secondary | ICD-10-CM

## 2024-04-16 MED ORDER — DULOXETINE HCL 20 MG PO CPEP
20.0000 mg | ORAL_CAPSULE | Freq: Every morning | ORAL | 1 refills | Status: AC
  Filled 2024-04-16: qty 30, 30d supply, fill #0

## 2024-04-18 ENCOUNTER — Other Ambulatory Visit: Payer: Self-pay | Admitting: Allergy

## 2024-04-18 ENCOUNTER — Other Ambulatory Visit (HOSPITAL_BASED_OUTPATIENT_CLINIC_OR_DEPARTMENT_OTHER): Payer: Self-pay

## 2024-04-20 ENCOUNTER — Other Ambulatory Visit (HOSPITAL_COMMUNITY): Payer: Self-pay

## 2024-04-20 MED ORDER — HYDROXYZINE PAMOATE 25 MG PO CAPS
25.0000 mg | ORAL_CAPSULE | Freq: Every day | ORAL | 1 refills | Status: AC
  Filled 2024-04-20 – 2024-04-30 (×6): qty 30, 30d supply, fill #0

## 2024-04-23 ENCOUNTER — Other Ambulatory Visit: Payer: Self-pay | Admitting: Hematology and Oncology

## 2024-04-23 ENCOUNTER — Other Ambulatory Visit: Payer: Self-pay | Admitting: Allergy

## 2024-04-23 ENCOUNTER — Ambulatory Visit

## 2024-04-23 ENCOUNTER — Other Ambulatory Visit (HOSPITAL_BASED_OUTPATIENT_CLINIC_OR_DEPARTMENT_OTHER): Payer: Self-pay

## 2024-04-23 ENCOUNTER — Other Ambulatory Visit (HOSPITAL_COMMUNITY): Payer: Self-pay

## 2024-04-23 ENCOUNTER — Other Ambulatory Visit: Payer: Self-pay | Admitting: Internal Medicine

## 2024-04-23 DIAGNOSIS — J302 Other seasonal allergic rhinitis: Secondary | ICD-10-CM

## 2024-04-23 DIAGNOSIS — D472 Monoclonal gammopathy: Secondary | ICD-10-CM

## 2024-04-24 ENCOUNTER — Other Ambulatory Visit (HOSPITAL_BASED_OUTPATIENT_CLINIC_OR_DEPARTMENT_OTHER): Payer: Self-pay

## 2024-04-24 ENCOUNTER — Inpatient Hospital Stay: Attending: Hematology and Oncology

## 2024-04-24 MED ORDER — LEVOCETIRIZINE DIHYDROCHLORIDE 5 MG PO TABS
5.0000 mg | ORAL_TABLET | Freq: Every day | ORAL | 5 refills | Status: AC
  Filled 2024-04-24 – 2024-04-30 (×4): qty 30, 30d supply, fill #0

## 2024-04-25 ENCOUNTER — Other Ambulatory Visit: Payer: Self-pay

## 2024-04-25 ENCOUNTER — Other Ambulatory Visit (HOSPITAL_BASED_OUTPATIENT_CLINIC_OR_DEPARTMENT_OTHER): Payer: Self-pay

## 2024-04-26 ENCOUNTER — Other Ambulatory Visit: Payer: Self-pay

## 2024-04-28 ENCOUNTER — Other Ambulatory Visit (HOSPITAL_COMMUNITY): Payer: Self-pay

## 2024-04-28 MED ORDER — NICOTINE 7 MG/24HR TD PT24
7.0000 mg | MEDICATED_PATCH | Freq: Every day | TRANSDERMAL | 0 refills | Status: AC
  Filled 2024-04-28: qty 30, 30d supply, fill #0

## 2024-04-29 ENCOUNTER — Other Ambulatory Visit: Payer: Self-pay

## 2024-04-29 ENCOUNTER — Encounter (HOSPITAL_COMMUNITY): Payer: Self-pay

## 2024-04-29 ENCOUNTER — Encounter (HOSPITAL_BASED_OUTPATIENT_CLINIC_OR_DEPARTMENT_OTHER): Payer: Self-pay

## 2024-04-30 ENCOUNTER — Other Ambulatory Visit (HOSPITAL_BASED_OUTPATIENT_CLINIC_OR_DEPARTMENT_OTHER): Payer: Self-pay

## 2024-04-30 ENCOUNTER — Other Ambulatory Visit (HOSPITAL_COMMUNITY): Payer: Self-pay

## 2024-04-30 ENCOUNTER — Other Ambulatory Visit: Payer: Self-pay

## 2024-05-01 ENCOUNTER — Other Ambulatory Visit (HOSPITAL_BASED_OUTPATIENT_CLINIC_OR_DEPARTMENT_OTHER): Payer: Self-pay

## 2024-05-02 ENCOUNTER — Other Ambulatory Visit (HOSPITAL_BASED_OUTPATIENT_CLINIC_OR_DEPARTMENT_OTHER): Payer: Self-pay

## 2024-05-02 ENCOUNTER — Other Ambulatory Visit: Payer: Self-pay

## 2024-05-02 MED ORDER — CLONAZEPAM 0.5 MG PO TABS
0.5000 mg | ORAL_TABLET | Freq: Every day | ORAL | 1 refills | Status: AC
  Filled 2024-05-02: qty 30, 30d supply, fill #0

## 2024-05-03 ENCOUNTER — Ambulatory Visit (INDEPENDENT_AMBULATORY_CARE_PROVIDER_SITE_OTHER): Admitting: *Deleted

## 2024-05-03 ENCOUNTER — Other Ambulatory Visit (HOSPITAL_BASED_OUTPATIENT_CLINIC_OR_DEPARTMENT_OTHER): Payer: Self-pay

## 2024-05-03 DIAGNOSIS — J302 Other seasonal allergic rhinitis: Secondary | ICD-10-CM | POA: Diagnosis not present

## 2024-05-03 MED ORDER — DULOXETINE HCL 60 MG PO CPEP
60.0000 mg | ORAL_CAPSULE | Freq: Every morning | ORAL | 0 refills | Status: AC
  Filled 2024-05-03: qty 90, 90d supply, fill #0

## 2024-05-04 ENCOUNTER — Other Ambulatory Visit: Payer: Self-pay | Admitting: Internal Medicine

## 2024-05-17 ENCOUNTER — Ambulatory Visit

## 2024-05-17 DIAGNOSIS — J302 Other seasonal allergic rhinitis: Secondary | ICD-10-CM

## 2024-07-24 ENCOUNTER — Other Ambulatory Visit

## 2024-07-24 ENCOUNTER — Ambulatory Visit: Admitting: Hematology and Oncology
